# Patient Record
Sex: Female | Born: 1990
Health system: Southern US, Community
[De-identification: ages and names within clinical notes are randomized; demographics above are authoritative.]

## PROBLEM LIST (undated history)

## (undated) ENCOUNTER — Inpatient Hospital Stay (HOSPITAL_COMMUNITY): Payer: Self-pay

## (undated) DIAGNOSIS — J069 Acute upper respiratory infection, unspecified: Secondary | ICD-10-CM

## (undated) DIAGNOSIS — E7800 Pure hypercholesterolemia, unspecified: Secondary | ICD-10-CM

## (undated) DIAGNOSIS — E0590 Thyrotoxicosis, unspecified without thyrotoxic crisis or storm: Secondary | ICD-10-CM

## (undated) DIAGNOSIS — R87619 Unspecified abnormal cytological findings in specimens from cervix uteri: Secondary | ICD-10-CM

## (undated) DIAGNOSIS — T783XXA Angioneurotic edema, initial encounter: Secondary | ICD-10-CM

## (undated) DIAGNOSIS — E119 Type 2 diabetes mellitus without complications: Secondary | ICD-10-CM

## (undated) DIAGNOSIS — H81399 Other peripheral vertigo, unspecified ear: Secondary | ICD-10-CM

## (undated) DIAGNOSIS — Z862 Personal history of diseases of the blood and blood-forming organs and certain disorders involving the immune mechanism: Secondary | ICD-10-CM

## (undated) DIAGNOSIS — F419 Anxiety disorder, unspecified: Secondary | ICD-10-CM

## (undated) DIAGNOSIS — Z87898 Personal history of other specified conditions: Secondary | ICD-10-CM

## (undated) DIAGNOSIS — D649 Anemia, unspecified: Secondary | ICD-10-CM

## (undated) DIAGNOSIS — T7840XA Allergy, unspecified, initial encounter: Secondary | ICD-10-CM

## (undated) DIAGNOSIS — K219 Gastro-esophageal reflux disease without esophagitis: Secondary | ICD-10-CM

## (undated) DIAGNOSIS — O039 Complete or unspecified spontaneous abortion without complication: Secondary | ICD-10-CM

## (undated) DIAGNOSIS — I10 Essential (primary) hypertension: Secondary | ICD-10-CM

## (undated) DIAGNOSIS — L509 Urticaria, unspecified: Secondary | ICD-10-CM

## (undated) DIAGNOSIS — D509 Iron deficiency anemia, unspecified: Secondary | ICD-10-CM

## (undated) HISTORY — DX: Personal history of diseases of the blood and blood-forming organs and certain disorders involving the immune mechanism: Z86.2

## (undated) HISTORY — DX: Thyrotoxicosis, unspecified without thyrotoxic crisis or storm: E05.90

## (undated) HISTORY — DX: Iron deficiency anemia, unspecified: D50.9

## (undated) HISTORY — DX: Allergy, unspecified, initial encounter: T78.40XA

## (undated) HISTORY — DX: Personal history of other specified conditions: Z87.898

## (undated) HISTORY — DX: Other peripheral vertigo, unspecified ear: H81.399

## (undated) HISTORY — DX: Angioneurotic edema, initial encounter: T78.3XXA

## (undated) HISTORY — DX: Urticaria, unspecified: L50.9

## (undated) HISTORY — DX: Anxiety disorder, unspecified: F41.9

## (undated) HISTORY — DX: Acute upper respiratory infection, unspecified: J06.9

## (undated) HISTORY — DX: Gastro-esophageal reflux disease without esophagitis: K21.9

## (undated) HISTORY — DX: Unspecified abnormal cytological findings in specimens from cervix uteri: R87.619

## (undated) HISTORY — DX: Type 2 diabetes mellitus without complications: E11.9

---
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
## 2009-02-06 ENCOUNTER — Emergency Department (HOSPITAL_COMMUNITY): Admission: EM | Admit: 2009-02-06 | Discharge: 2009-02-06 | Payer: Self-pay | Admitting: Emergency Medicine

## 2009-06-25 ENCOUNTER — Emergency Department (HOSPITAL_COMMUNITY): Admission: EM | Admit: 2009-06-25 | Discharge: 2009-06-25 | Payer: Self-pay | Admitting: Emergency Medicine

## 2009-06-28 ENCOUNTER — Emergency Department (HOSPITAL_COMMUNITY): Admission: EM | Admit: 2009-06-28 | Discharge: 2009-06-28 | Payer: Self-pay | Admitting: Emergency Medicine

## 2009-06-30 ENCOUNTER — Emergency Department (HOSPITAL_COMMUNITY): Admission: EM | Admit: 2009-06-30 | Discharge: 2009-06-30 | Payer: Self-pay | Admitting: Emergency Medicine

## 2009-10-11 ENCOUNTER — Emergency Department (HOSPITAL_COMMUNITY): Admission: EM | Admit: 2009-10-11 | Discharge: 2009-10-11 | Payer: Self-pay | Admitting: Emergency Medicine

## 2009-10-13 ENCOUNTER — Emergency Department (HOSPITAL_COMMUNITY): Admission: EM | Admit: 2009-10-13 | Discharge: 2009-10-13 | Payer: Self-pay | Admitting: Emergency Medicine

## 2010-03-20 ENCOUNTER — Emergency Department (HOSPITAL_COMMUNITY): Admission: EM | Admit: 2010-03-20 | Discharge: 2010-03-21 | Payer: Self-pay | Admitting: Emergency Medicine

## 2010-10-31 LAB — CBC
MCH: 24.2 pg — ABNORMAL LOW (ref 26.0–34.0)
MCHC: 33.6 g/dL (ref 30.0–36.0)
Platelets: 286 10*3/uL (ref 150–400)
RBC: 4.77 MIL/uL (ref 3.87–5.11)
RDW: 15.6 % — ABNORMAL HIGH (ref 11.5–15.5)

## 2010-10-31 LAB — URINALYSIS, ROUTINE W REFLEX MICROSCOPIC
Bilirubin Urine: NEGATIVE
Glucose, UA: NEGATIVE mg/dL
Hgb urine dipstick: NEGATIVE
Ketones, ur: NEGATIVE mg/dL
Nitrite: NEGATIVE
Urobilinogen, UA: 0.2 mg/dL (ref 0.0–1.0)
pH: 5 (ref 5.0–8.0)

## 2010-10-31 LAB — DIFFERENTIAL
Band Neutrophils: 0 % (ref 0–10)
Blasts: 0 %
Eosinophils Absolute: 0 10*3/uL (ref 0.0–0.7)
Myelocytes: 0 %
Promyelocytes Absolute: 0 %
nRBC: 0 /100 WBC

## 2010-10-31 LAB — BASIC METABOLIC PANEL
Calcium: 10.2 mg/dL (ref 8.4–10.5)
Creatinine, Ser: 0.49 mg/dL (ref 0.4–1.2)
GFR calc Af Amer: >60 mL/min (ref >60)
GFR calc non Af Amer: >60 mL/min (ref >60)
Glucose, Bld: 149 mg/dL — ABNORMAL HIGH (ref 70–99)

## 2010-10-31 LAB — POCT PREGNANCY, URINE: Preg Test, Ur: NEGATIVE

## 2010-10-31 LAB — URINE MICROSCOPIC-ADD ON

## 2010-11-19 LAB — STREP A DNA PROBE: Group A Strep Probe: NEGATIVE

## 2010-11-19 LAB — RAPID STREP SCREEN (MED CTR MEBANE ONLY): Streptococcus, Group A Screen (Direct): NEGATIVE

## 2011-05-11 LAB — URINALYSIS, ROUTINE W REFLEX MICROSCOPIC
Bilirubin Urine: NEGATIVE
Glucose, UA: NEGATIVE
Leukocytes, UA: NEGATIVE
Specific Gravity, Urine: 1.025
Urobilinogen, UA: 0.2

## 2011-05-11 LAB — URINE MICROSCOPIC-ADD ON

## 2011-06-27 ENCOUNTER — Encounter: Payer: Self-pay | Admitting: Emergency Medicine

## 2011-06-27 ENCOUNTER — Emergency Department (HOSPITAL_COMMUNITY)
Admission: EM | Admit: 2011-06-27 | Discharge: 2011-06-28 | Disposition: A | Discharge status: Home / Self Care | Payer: Self-pay | Attending: Emergency Medicine | Admitting: Emergency Medicine

## 2011-06-27 DIAGNOSIS — R110 Nausea: Secondary | ICD-10-CM | POA: Insufficient documentation

## 2011-06-27 DIAGNOSIS — R109 Unspecified abdominal pain: Secondary | ICD-10-CM | POA: Insufficient documentation

## 2011-06-27 DIAGNOSIS — H538 Other visual disturbances: Secondary | ICD-10-CM | POA: Insufficient documentation

## 2011-06-27 DIAGNOSIS — J029 Acute pharyngitis, unspecified: Secondary | ICD-10-CM | POA: Insufficient documentation

## 2011-06-27 DIAGNOSIS — R42 Dizziness and giddiness: Secondary | ICD-10-CM | POA: Insufficient documentation

## 2011-06-27 DIAGNOSIS — R197 Diarrhea, unspecified: Secondary | ICD-10-CM | POA: Insufficient documentation

## 2011-06-27 DIAGNOSIS — F17200 Nicotine dependence, unspecified, uncomplicated: Secondary | ICD-10-CM | POA: Insufficient documentation

## 2011-06-27 LAB — URINALYSIS, ROUTINE W REFLEX MICROSCOPIC
Glucose, UA: NEGATIVE mg/dL
Hgb urine dipstick: NEGATIVE
Ketones, ur: NEGATIVE mg/dL
Leukocytes, UA: NEGATIVE
Nitrite: NEGATIVE
Urobilinogen, UA: 0.2 mg/dL (ref 0.0–1.0)

## 2011-06-27 LAB — RAPID STREP SCREEN (MED CTR MEBANE ONLY): Streptococcus, Group A Screen (Direct): NEGATIVE

## 2011-06-27 NOTE — ED Notes (Signed)
Patient states she has been nauseated all day, then became dizzy with blurry vision while at work about 1630 today. Also c/o sore throat and abdominal pain. Patient states it is possible she could be pregnant.

## 2011-06-27 NOTE — ED Provider Notes (Addendum)
History  Scribed for Geoffery Lyons, MD, the patient was seen in room APA07. This chart was scribed by Hillery Hunter.   CSN: [id number] Arrival date & time: 06/27/2011  9:25 PM   First MD Initiated Contact with Patient 06/27/11 2223      Chief Complaint  Patient presents with  . Nausea  . Blurred Vision    The history is provided by the patient.    Kellie Shaffer is a 20 y.o. female who presents to the Emergency Department complaining of lower abdominal pain and nausea all day today. She reports vomiting once before going to work, having intermittent diarrhea, light-headedness, dim vision while working at her desk today, and sore throat that started recently today as well. She describes the abdominal pain as stomach cramping and denies any associated difficulty with urination, dysuria, vaginal bleeding or discharge. She denies any recent illness contact.    History reviewed. No pertinent past medical history. Patient denies any significant medical history or taking any current medications. LNMP 06/03/2011  History reviewed. No pertinent past surgical history.  History reviewed. No pertinent family history.  History  Substance Use Topics  . Smoking status: Current Everyday Smoker  . Smokeless tobacco: Not on file  . Alcohol Use: No     Review of Systems  HENT: Positive for sore throat.   Eyes: Positive for visual disturbance (dim vision intermittently at work today).  Respiratory: Negative for shortness of breath.   Cardiovascular: Negative for chest pain.  Gastrointestinal: Positive for nausea (all day today), abdominal pain and diarrhea (intermittently today).  Genitourinary: Negative for dysuria, hematuria, vaginal bleeding, vaginal discharge, difficulty urinating and menstrual problem.  Musculoskeletal: Negative for back pain.  Skin: Negative for rash.  Neurological: Positive for light-headedness.  Psychiatric/Behavioral: Negative for confusion.     Allergies  Review of patient's allergies indicates no known allergies.  Home Medications   Current Outpatient Rx  Name Route Sig Dispense Refill  . CETIRIZINE HCL 10 MG PO TABS Oral Take 10 mg by mouth daily.        Triage Vitals: BP 143/81  Pulse 86  Temp(Src) 98.8 F (37.1 C) (Oral)  Resp 14  Ht 5\' 9"  (1.753 m)  Wt 221 lb (100.245 kg)  BMI 32.64 kg/m2  SpO2 100%  LMP 06/03/2011  Physical Exam  Nursing note and vitals reviewed. Constitutional: She is oriented to person, place, and time. She appears well-developed and well-nourished. No distress.  HENT:  Head: Normocephalic and atraumatic.  Eyes: Conjunctivae are normal. Pupils are equal, round, and reactive to light. No scleral icterus.  Neck: Neck supple.  Cardiovascular: Normal rate, regular rhythm and normal heart sounds.   No murmur heard. Pulmonary/Chest: Effort normal and breath sounds normal. No respiratory distress.  Abdominal: Soft. She exhibits no distension. There is tenderness. There is no CVA tenderness.  Musculoskeletal: She exhibits no edema and no tenderness.  Neurological: She is alert and oriented to person, place, and time.  Skin: Skin is warm and dry.  Psychiatric: She has a normal mood and affect. Her behavior is normal.    ED Course  Procedures   Results for orders placed during the hospital encounter of 06/27/11  RAPID STREP SCREEN      Component Value Range   Streptococcus, Group A Screen (Direct) NEGATIVE  NEGATIVE   URINALYSIS, ROUTINE W REFLEX MICROSCOPIC      Component Value Range   Color, Urine YELLOW  YELLOW    Appearance CLEAR  CLEAR  Specific Gravity, Urine 1.025  1.005 - 1.030    pH 6.0  5.0 - 8.0    Glucose, UA NEGATIVE  NEGATIVE (mg/dL)   Hgb urine dipstick NEGATIVE  NEGATIVE    Bilirubin Urine NEGATIVE  NEGATIVE    Ketones, ur NEGATIVE  NEGATIVE (mg/dL)   Protein, ur NEGATIVE  NEGATIVE (mg/dL)   Urobilinogen, UA 0.2  0.0 - 1.0 (mg/dL)   Nitrite NEGATIVE  NEGATIVE     Leukocytes, UA NEGATIVE  NEGATIVE   PREGNANCY, URINE      Component Value Range   Preg Test, Ur NEGATIVE      OTHER DATA REVIEWED: Nursing notes, vital signs reviewed.   DIAGNOSTIC STUDIES: Oxygen Saturation is 100% on room air, normal by my interpretation.     ED COURSE / COORDINATION OF CARE: 22:24. Ordered: Rapid strep screen ; Urinalysis with microscopic ; Pregnancy, urine  23:30. Discussed test findings with patient at bedside who is resting comfortably and amenable to discharge, patient acknowledges return conditions and agrees to treatment plan.   MDM  UA, strep test okay.  Not pregnant.  Will discharge and give time.     1. Pharyngitis   2. Blurry vision       I personally performed the services described in this documentation, which was scribed in my presence. The recorded information has been reviewed and considered.     Geoffery Lyons, MD 06/27/11 1610  Geoffery Lyons, MD 06/27/11 [phone]

## 2011-12-14 ENCOUNTER — Emergency Department (HOSPITAL_COMMUNITY): Payer: Self-pay

## 2011-12-14 ENCOUNTER — Emergency Department (HOSPITAL_COMMUNITY)
Admission: EM | Admit: 2011-12-14 | Discharge: 2011-12-15 | Disposition: A | Discharge status: Home / Self Care | Payer: Self-pay | Attending: Emergency Medicine | Admitting: Emergency Medicine

## 2011-12-14 ENCOUNTER — Encounter (HOSPITAL_COMMUNITY): Payer: Self-pay

## 2011-12-14 DIAGNOSIS — N390 Urinary tract infection, site not specified: Secondary | ICD-10-CM | POA: Insufficient documentation

## 2011-12-14 DIAGNOSIS — R1031 Right lower quadrant pain: Secondary | ICD-10-CM | POA: Insufficient documentation

## 2011-12-14 DIAGNOSIS — A5909 Other urogenital trichomoniasis: Secondary | ICD-10-CM | POA: Insufficient documentation

## 2011-12-14 DIAGNOSIS — O2390 Unspecified genitourinary tract infection in pregnancy, unspecified trimester: Secondary | ICD-10-CM | POA: Insufficient documentation

## 2011-12-14 DIAGNOSIS — N83209 Unspecified ovarian cyst, unspecified side: Secondary | ICD-10-CM | POA: Insufficient documentation

## 2011-12-14 DIAGNOSIS — F17200 Nicotine dependence, unspecified, uncomplicated: Secondary | ICD-10-CM | POA: Insufficient documentation

## 2011-12-14 LAB — URINALYSIS, ROUTINE W REFLEX MICROSCOPIC
Hgb urine dipstick: NEGATIVE
Urobilinogen, UA: 1 mg/dL (ref 0.0–1.0)
pH: 7 (ref 5.0–8.0)

## 2011-12-14 LAB — WET PREP, GENITAL
Clue Cells Wet Prep HPF POC: NONE SEEN
Yeast Wet Prep HPF POC: NONE SEEN

## 2011-12-14 LAB — URINE MICROSCOPIC-ADD ON

## 2011-12-14 LAB — CBC
HCT: 31.8 % — ABNORMAL LOW (ref 36.0–46.0)
Hemoglobin: 10.6 g/dL — ABNORMAL LOW (ref 12.0–15.0)
MCH: 24.6 pg — ABNORMAL LOW (ref 26.0–34.0)
MCHC: 33.3 g/dL (ref 30.0–36.0)

## 2011-12-14 MED ORDER — MORPHINE SULFATE 4 MG/ML IJ SOLN
4.0000 mg | Freq: Once | INTRAMUSCULAR | Status: AC
  Administered 2011-12-14: 4 mg via INTRAVENOUS
  Filled 2011-12-14: qty 1

## 2011-12-14 MED ORDER — SODIUM CHLORIDE 0.9 % IV SOLN
INTRAVENOUS | Status: DC
  Administered 2011-12-14: 23:00:00 via INTRAVENOUS

## 2011-12-14 NOTE — ED Provider Notes (Addendum)
History     CSN: [id number]  Arrival date & time 12/14/11  1851   First MD Initiated Contact with Patient 12/14/11 2135      Chief Complaint  Patient presents with  . Abdominal Pain    (Consider location/radiation/quality/duration/timing/severity/associated sxs/prior treatment) HPI Complains of right lower quadrant pain sharp nonradiating onset this morning, moderate to severe made worse with coughing improved with treatment with a heating pad. No anorexia no fever no other associated symptoms. Last menstrual period 10/21/2011 patient had recent pregnancy test at health department in [place] which was positive No past medical history on file. As medical history is negative No past surgical history on file.  No family history on file.  History  Substance Use Topics  . Smoking status: Current Everyday Smoker  . Smokeless tobacco: Not on file  . Alcohol Use: No    OB History    Grav Para Term Preterm Abortions TAB SAB Ect Mult Living                  Review of Systems  Constitutional: Negative.   HENT: Negative.   Respiratory: Negative.   Cardiovascular: Negative.   Gastrointestinal: Positive for abdominal pain.  Genitourinary:       Pregnant  Musculoskeletal: Negative.   Skin: Negative.   Neurological: Negative.   Hematological: Negative.   Psychiatric/Behavioral: Negative.   All other systems reviewed and are negative.    Allergies  Review of patient's allergies indicates no known allergies.  Home Medications   Current Outpatient Rx  Name Route Sig Dispense Refill  . CETIRIZINE HCL 10 MG PO TABS Oral Take 10 mg by mouth daily.      Marland Kitchen FERROUS SULFATE 325 (65 FE) MG PO TABS Oral Take 325 mg by mouth daily with breakfast.    . OMEGA-3 FATTY ACIDS 1000 MG PO CAPS Oral Take 4 g by mouth daily.      BP 153/95  Pulse 95  Temp(Src) 97.8 F (36.6 C) (Oral)  Resp 18  SpO2 99%  LMP 10/21/2011  Physical Exam  Nursing note and vitals  reviewed. Constitutional: She appears well-developed and well-nourished.  HENT:  Head: Normocephalic and atraumatic.  Eyes: Conjunctivae are normal. Pupils are equal, round, and reactive to light.  Neck: Neck supple. No tracheal deviation present. No thyromegaly present.  Cardiovascular: Normal rate and regular rhythm.   No murmur heard. Pulmonary/Chest: Effort normal and breath sounds normal.  Abdominal: Soft. Bowel sounds are normal. She exhibits no distension. There is no tenderness.       Obese  Genitourinary:       Pelvic exam no external lesion slight amount yellow discharge cervical os closed no cervical motion tenderness no adnexal masses or tenderness  Musculoskeletal: Normal range of motion. She exhibits no edema and no tenderness.  Neurological: She is alert. Coordination normal.  Skin: Skin is warm and dry. No rash noted.  Psychiatric: She has a normal mood and affect.    ED Course  Procedures (including critical care time) 10:40 PM pain improved after treatment with intravenous morphine. Labs Reviewed  URINALYSIS, ROUTINE W REFLEX MICROSCOPIC   No results found.  Results for orders placed during the hospital encounter of 12/14/11  URINALYSIS, ROUTINE W REFLEX MICROSCOPIC      Component Value Range   Color, Urine YELLOW  YELLOW    APPearance CLEAR  CLEAR    Specific Gravity, Urine 1.020  1.005 - 1.030    pH 7.0  5.0 - 8.0  Glucose, UA NEGATIVE  NEGATIVE (mg/dL)   Hgb urine dipstick NEGATIVE  NEGATIVE    Bilirubin Urine NEGATIVE  NEGATIVE    Ketones, ur NEGATIVE  NEGATIVE (mg/dL)   Protein, ur NEGATIVE  NEGATIVE (mg/dL)   Urobilinogen, UA 1.0  0.0 - 1.0 (mg/dL)   Nitrite NEGATIVE  NEGATIVE    Leukocytes, UA MODERATE (*) NEGATIVE   POCT PREGNANCY, URINE      Component Value Range   Preg Test, Ur POSITIVE (*) NEGATIVE   CBC      Component Value Range   WBC 7.5  4.0 - 10.5 (K/uL)   RBC 4.31  3.87 - 5.11 (MIL/uL)   Hemoglobin 10.6 (*) 12.0 - 15.0 (g/dL)    HCT 40.9 (*) 81.1 - 46.0 (%)   MCV 73.8 (*) 78.0 - 100.0 (fL)   MCH 24.6 (*) 26.0 - 34.0 (pg)   MCHC 33.3  30.0 - 36.0 (g/dL)   RDW 91.4  78.2 - 95.6 (%)   Platelets 274  150 - 400 (K/uL)  TYPE AND SCREEN      Component Value Range   ABO/RH(D) O POS     Antibody Screen NEG     Sample Expiration 12/17/2011    WET PREP, GENITAL      Component Value Range   Yeast Wet Prep HPF POC NONE SEEN  NONE SEEN    Trich, Wet Prep RARE (*) NONE SEEN    Clue Cells Wet Prep HPF POC NONE SEEN  NONE SEEN    WBC, Wet Prep HPF POC MANY (*) NONE SEEN   HCG, QUANTITATIVE, PREGNANCY      Component Value Range   hCG, Beta Chain, Quant, S 21308 (*) <5 (mIU/mL)  URINE MICROSCOPIC-ADD ON      Component Value Range   Squamous Epithelial / LPF FEW (*) RARE    WBC, UA 7-10  <3 (WBC/hpf)   RBC / HPF 0-2  <3 (RBC/hpf)   Bacteria, UA MANY (*) RARE    Urine-Other MUCOUS PRESENT    ABO/RH      Component Value Range   ABO/RH(D) O POS     No results found.  No diagnosis found.   Patient signed out to Dr.Palumbo at 12:40 PM MDM  Pelvic ultrasound pending to rule out ectopic pregnancy . Doubt appendicitis no fever no anorexia no right lower quadrant tenderness Diagnosis #1 abdominal pain #2 pregnancy      Doug Sou, MD 12/15/11 [id number]  Doug Sou, MD 12/15/11 [id number]

## 2011-12-14 NOTE — ED Notes (Signed)
MD at bedside. 

## 2011-12-14 NOTE — ED Notes (Signed)
Complains of abd pain and per oncall md was told to come here for rule out tubal preganncy

## 2011-12-14 NOTE — ED Notes (Signed)
Patient transported to Ultrasound 

## 2011-12-15 LAB — ABO/RH: ABO/RH(D): O POS

## 2011-12-15 LAB — GC/CHLAMYDIA PROBE AMP, GENITAL: Chlamydia, DNA Probe: NEGATIVE

## 2011-12-15 MED ORDER — CEFTRIAXONE SODIUM 250 MG IJ SOLR
250.0000 mg | Freq: Once | INTRAMUSCULAR | Status: AC
  Administered 2011-12-15: 250 mg via INTRAMUSCULAR
  Filled 2011-12-15: qty 250

## 2011-12-15 MED ORDER — NITROFURANTOIN MONOHYD MACRO 100 MG PO CAPS: 100.0000 mg | ORAL_CAPSULE | Freq: Two times a day (BID) | ORAL | Status: AC

## 2011-12-15 MED ORDER — AZITHROMYCIN 1 G PO PACK
1.0000 g | PACK | Freq: Once | ORAL | Status: AC
  Administered 2011-12-15: 1 g via ORAL
  Filled 2011-12-15: qty 1

## 2011-12-15 MED ORDER — METRONIDAZOLE 500 MG PO TABS
2000.0000 mg | ORAL_TABLET | Freq: Once | ORAL | Status: AC
  Administered 2011-12-15: 2000 mg via ORAL
  Filled 2011-12-15: qty 4

## 2011-12-15 MED ORDER — LIDOCAINE HCL (PF) 1 % IJ SOLN
INTRAMUSCULAR | Status: AC
  Administered 2011-12-15: 5 mL
  Filled 2011-12-15: qty 5

## 2011-12-15 NOTE — ED Provider Notes (Deleted)
Received patient.  Patient gave consent to speak with family member present.  Patient has UTI and trichomonas on wet prep.  Will treat for GC chlamydia and UTI.  Patient informed will need follow up ultrasound to ensure cyst resolution and also informed no sexual activity until 7 days after all partners treated.  Verbalizes understanding and agrees to follow up  Quinita Kostelecky Smitty Cords, MD 12/15/11 0127

## 2011-12-15 NOTE — ED Notes (Signed)
Patient is AOx4 and comfortable with her discharge instructions. 

## 2011-12-15 NOTE — Discharge Instructions (Signed)
Ovarian Cyst The ovaries are small organs that are on each side of the uterus. The ovaries are the organs that produce the female hormones, estrogen and progesterone. An ovarian cyst is a sac filled with fluid that can vary in its size. It is normal for a small cyst to form in women who are in the childbearing age and who have menstrual periods. This type of cyst is called a follicle cyst that becomes an ovulation cyst (corpus luteum cyst) after it produces the women's egg. It later goes away on its own if the woman does not become pregnant. There are other kinds of ovarian cysts that may cause problems and may need to be treated. The most serious problem is a cyst with cancer. It should be noted that menopausal women who have an ovarian cyst are at a higher risk of it being a cancer cyst. They should be evaluated very quickly, thoroughly and followed closely. This is especially true in menopausal women because of the high rate of ovarian cancer in women in menopause. CAUSES AND TYPES OF OVARIAN CYSTS:  FUNCTIONAL CYST: The follicle/corpus luteum cyst is a functional cyst that occurs every month during ovulation with the menstrual cycle. They go away with the next menstrual cycle if the woman does not get pregnant. Usually, there are no symptoms with a functional cyst.   ENDOMETRIOMA CYST: This cyst develops from the lining of the uterus tissue. This cyst gets in or on the ovary. It grows every month from the bleeding during the menstrual period. It is also called a "chocolate cyst" because it becomes filled with blood that turns brown. This cyst can cause pain in the lower abdomen during intercourse and with your menstrual period.   CYSTADENOMA CYST: This cyst develops from the cells on the outside of the ovary. They usually are not cancerous. They can get very big and cause lower abdomen pain and pain with intercourse. This type of cyst can twist on itself, cut off its blood supply and cause severe pain.  It also can easily rupture and cause a lot of pain.   DERMOID CYST: This type of cyst is sometimes found in both ovaries. They are found to have different kinds of body tissue in the cyst. The tissue includes skin, teeth, hair, and/or cartilage. They usually do not have symptoms unless they get very big. Dermoid cysts are rarely cancerous.   POLYCYSTIC OVARY: This is a rare condition with hormone problems that produces many small cysts on both ovaries. The cysts are follicle-like cysts that never produce an egg and become a corpus luteum. It can cause an increase in body weight, infertility, acne, increase in body and facial hair and lack of menstrual periods or rare menstrual periods. Many women with this problem develop type 2 diabetes. The exact cause of this problem is unknown. A polycystic ovary is rarely cancerous.   THECA LUTEIN CYST: Occurs when too much hormone (human chorionic gonadotropin) is produced and over-stimulates the ovaries to produce an egg. They are frequently seen when doctors stimulate the ovaries for invitro-fertilization (test tube babies).   LUTEOMA CYST: This cyst is seen during pregnancy. Rarely it can cause an obstruction to the birth canal during labor and delivery. They usually go away after delivery.  SYMPTOMS   Pelvic pain or pressure.   Pain during sexual intercourse.   Increasing girth (swelling) of the abdomen.   Abnormal menstrual periods.   Increasing pain with menstrual periods.   You stop having   menstrual periods and you are not pregnant.  DIAGNOSIS  The diagnosis can be made during:  Routine or annual pelvic examination (common).   Ultrasound.   X-ray of the pelvis.   CT Scan.   MRI.   Blood tests.  TREATMENT   Treatment may only be to follow the cyst monthly for 2 to 3 months with your caregiver. Many go away on their own, especially functional cysts.   May be aspirated (drained) with a long needle with ultrasound, or by laparoscopy  (inserting a tube into the pelvis through a small incision).   The whole cyst can be removed by laparoscopy.   Sometimes the cyst may need to be removed through an incision in the lower abdomen.   Hormone treatment is sometimes used to help dissolve certain cysts.   Birth control pills are sometimes used to help dissolve certain cysts.  HOME CARE INSTRUCTIONS  Follow your caregiver's advice regarding:  Medicine.   Follow up visits to evaluate and treat the cyst.   You may need to come back or make an appointment with another caregiver, to find the exact cause of your cyst, if your caregiver is not a gynecologist.   Get your yearly and recommended pelvic examinations and Pap tests.   Let your caregiver know if you have had an ovarian cyst in the past.  SEEK MEDICAL CARE IF:   Your periods are late, irregular, they stop, or are painful.   Your stomach (abdomen) or pelvic pain does not go away.   Your stomach becomes larger or swollen.   You have pressure on your bladder or trouble emptying your bladder completely.   You have painful sexual intercourse.   You have feelings of fullness, pressure, or discomfort in your stomach.   You lose weight for no apparent reason.   You feel generally ill.   You become constipated.   You lose your appetite.   You develop acne.   You have an increase in body and facial hair.   You are gaining weight, without changing your exercise and eating habits.   You think you are pregnant.  SEEK IMMEDIATE MEDICAL CARE IF:   You have increasing abdominal pain.   You feel sick to your stomach (nausea) and/or vomit.   You develop a fever that comes on suddenly.   You develop abdominal pain during a bowel movement.   Your menstrual periods become heavier than usual.  Document Released: 08/03/2005 Document Revised: 07/23/2011 Document Reviewed: 06/06/2009 [employer] Patient Information 2012 [employer]. 

## 2011-12-15 NOTE — ED Provider Notes (Signed)
Received patient. Patient gave consent to speak with family member present. Patient has UTI and trichomonas on wet prep. Will treat for GC chlamydia and UTI. Patient informed will need follow up ultrasound to ensure cyst resolution and also informed no sexual activity until 7 days after all partners treated. Verbalizes understanding and agrees to follow up   124 Case d/w Dr. Algie Coffer, patient does not need to be admitted if able to tolerate PO without fever but will need follow up cultures.    Jasmine Awe, MD 12/15/11 [phone]

## 2011-12-16 DIAGNOSIS — IMO0001 Reserved for inherently not codable concepts without codable children: Secondary | ICD-10-CM

## 2011-12-16 DIAGNOSIS — O039 Complete or unspecified spontaneous abortion without complication: Secondary | ICD-10-CM

## 2011-12-16 HISTORY — DX: Reserved for inherently not codable concepts without codable children: IMO0001

## 2011-12-16 HISTORY — DX: Complete or unspecified spontaneous abortion without complication: O03.9

## 2012-01-01 ENCOUNTER — Ambulatory Visit (INDEPENDENT_AMBULATORY_CARE_PROVIDER_SITE_OTHER): Payer: Self-pay | Admitting: Endocrinology

## 2012-01-01 ENCOUNTER — Encounter: Payer: Self-pay | Admitting: Endocrinology

## 2012-01-01 VITALS — BP 132/62 | HR 104 | Temp 98.0°F | Ht 69.0 in | Wt 221.0 lb

## 2012-01-01 DIAGNOSIS — E0590 Thyrotoxicosis, unspecified without thyrotoxic crisis or storm: Secondary | ICD-10-CM

## 2012-01-01 DIAGNOSIS — R739 Hyperglycemia, unspecified: Secondary | ICD-10-CM

## 2012-01-01 DIAGNOSIS — R7309 Other abnormal glucose: Secondary | ICD-10-CM

## 2012-01-01 MED ORDER — PROPYLTHIOURACIL 50 MG PO TABS: 150.0000 mg | ORAL_TABLET | Freq: Three times a day (TID) | ORAL | Status: DC

## 2012-01-01 NOTE — Patient Instructions (Addendum)
i have sent a prescription to your pharmacy, for a medication to slow down the thyroid.  if ever you have fever while taking this medication, stop it and call us, because of the risk of a rare side-effect. Please come back for a follow-up appointment in 2 weeks. If you continue the pregnancy, it is extremely important that you start checking your blood sugar.  You would probably need insulin.  You should keep the appointment in [place] next week, if you want to terminate the pregnancy.

## 2012-01-01 NOTE — Progress Notes (Signed)
Subjective:    Patient ID: Kellie Shaffer, female    DOB: Dec 29, 1990, 21 y.o.   MRN: [id number]  HPI Pt was found to have elev a1c and ow tsh 2 mos ago.  She is [redacted] weeks pregnant, and has an appointment next week with an abortion clinic in [place].  She says she was poorly prepared for the pregnancy, and continues to consume alcohol. Pt has a few weeks of moderate pain at the right pelvis (she says she was told at [facility] that pain was due to an ovarian cyst).  No assoc fever.  Past Medical History  Diagnosis Date  . Hyperthyroidism   . Migraine     No past surgical history on file.  History   Social History  . Marital Status: Single    Spouse Name: N/A    Number of Children: N/A  . Years of Education: 14   Occupational History  . [employer]    Social History Main Topics  . Smoking status: Current Everyday Smoker  . Smokeless tobacco: Not on file  . Alcohol Use: Yes  . Drug Use: No  . Sexually Active: Yes    Birth Control/ Protection: Condom   Other Topics Concern  . Not on file   Social History Narrative   Regular exercise-yesCaffeine Use-yes    No current outpatient prescriptions on file prior to visit.    No Known Allergies  Family History  Problem Relation Age of Onset  . Diabetes Other     Parent  . Cancer Other     Breast Cancer-Parent  . Hypertension Other     Parent  . Hyperlipidemia Other     Parent  . Arthritis Other     Parent  no goiter or other thyroid problem Mother has DM BP 132/62  Pulse 104  Temp(Src) 98 F (36.7 C) (Oral)  Ht 5\' 9"  (1.753 m)  Wt 221 lb (100.245 kg)  BMI 32.64 kg/m2  SpO2 99%  LMP 10/21/2011  Review of Systems denies weight loss, headache, hoarseness, double vision, sob, diarrhea, polyuria, myalgias, excessive diaphoresis, numbness, anxiety, hypoglycemia, easy bruising, and rhinorrhea.  She has palpitations and tremor.      Objective:   Physical Exam VS: see vs page GEN: no distress.  obese HEAD:  head: no deformity eyes: no periorbital swelling/.  There is bilateral proptosis. external nose and ears are normal mouth: no lesion seen NECK: thyroid is slightly and diffusely enlarged CHEST WALL: no deformity LUNGS:  Clear to auscultation CV: reg rate and rhythm, no murmur ABD: abdomen is soft, nontender.  no hepatosplenomegaly.  not distended.  no hernia MUSCULOSKELETAL: muscle bulk and strength are grossly normal.  no obvious joint swelling.  gait is normal and steady EXTEMITIES: no deformity.  no ulcer on the feet.  feet are of normal color and temp.  no edema PULSES: dorsalis pedis intact bilat.  no carotid bruit NEURO:  cn 2-12 grossly intact.   readily moves all 4's.  sensation is intact to touch on the feet SKIN:  Normal texture and temperature.  No rash or suspicious lesion is visible.   NODES:  None palpable at the neck PSYCH: alert, oriented x3.  Does not appear anxious nor depressed.   outside test results are reviewed: TSH is undetectable a1c=6.3    Assessment & Plan:  Hyperthyroidism, prob due to grave's dz Hyperglycemia.  This a1c almost certainly indicates GDM, which carries a high risk her health, and that of her unborn  child. Smoking and alcohol consumption.  These cause high risk to her unborn child.

## 2012-01-02 ENCOUNTER — Inpatient Hospital Stay (HOSPITAL_COMMUNITY): Payer: Self-pay

## 2012-01-02 ENCOUNTER — Inpatient Hospital Stay (HOSPITAL_COMMUNITY)
Admission: AD | Admit: 2012-01-02 | Discharge: 2012-01-02 | Disposition: A | Discharge status: Home / Self Care | Payer: Self-pay | Source: Ambulatory Visit | Attending: Obstetrics & Gynecology | Admitting: Obstetrics & Gynecology

## 2012-01-02 ENCOUNTER — Encounter (HOSPITAL_COMMUNITY): Payer: Self-pay | Admitting: *Deleted

## 2012-01-02 DIAGNOSIS — O99280 Endocrine, nutritional and metabolic diseases complicating pregnancy, unspecified trimester: Secondary | ICD-10-CM | POA: Insufficient documentation

## 2012-01-02 DIAGNOSIS — O209 Hemorrhage in early pregnancy, unspecified: Secondary | ICD-10-CM | POA: Insufficient documentation

## 2012-01-02 DIAGNOSIS — R109 Unspecified abdominal pain: Secondary | ICD-10-CM | POA: Insufficient documentation

## 2012-01-02 DIAGNOSIS — N831 Corpus luteum cyst of ovary, unspecified side: Secondary | ICD-10-CM | POA: Insufficient documentation

## 2012-01-02 DIAGNOSIS — O34599 Maternal care for other abnormalities of gravid uterus, unspecified trimester: Secondary | ICD-10-CM | POA: Insufficient documentation

## 2012-01-02 DIAGNOSIS — E0590 Thyrotoxicosis, unspecified without thyrotoxic crisis or storm: Secondary | ICD-10-CM | POA: Insufficient documentation

## 2012-01-02 DIAGNOSIS — O26899 Other specified pregnancy related conditions, unspecified trimester: Secondary | ICD-10-CM

## 2012-01-02 DIAGNOSIS — E079 Disorder of thyroid, unspecified: Secondary | ICD-10-CM | POA: Insufficient documentation

## 2012-01-02 HISTORY — DX: Pure hypercholesterolemia, unspecified: E78.00

## 2012-01-02 LAB — CBC
HCT: 30 % — ABNORMAL LOW (ref 36.0–46.0)
Hemoglobin: 9.7 g/dL — ABNORMAL LOW (ref 12.0–15.0)
MCH: 24.3 pg — ABNORMAL LOW (ref 26.0–34.0)
MCV: 75 fL — ABNORMAL LOW (ref 78.0–100.0)
RBC: 4 MIL/uL (ref 3.87–5.11)

## 2012-01-02 LAB — COMPREHENSIVE METABOLIC PANEL
AST: 16 U/L (ref 0–37)
Albumin: 3.3 g/dL — ABNORMAL LOW (ref 3.5–5.2)
Calcium: 9.3 mg/dL (ref 8.4–10.5)
Chloride: 103 mEq/L (ref 96–112)
Creatinine, Ser: 0.37 mg/dL — ABNORMAL LOW (ref 0.50–1.10)
Total Bilirubin: 0.4 mg/dL (ref 0.3–1.2)
Total Protein: 6.6 g/dL (ref 6.0–8.3)

## 2012-01-02 LAB — URINALYSIS, ROUTINE W REFLEX MICROSCOPIC
Ketones, ur: NEGATIVE mg/dL
Leukocytes, UA: NEGATIVE
Nitrite: NEGATIVE
Urobilinogen, UA: 1 mg/dL (ref 0.0–1.0)
pH: 7 (ref 5.0–8.0)

## 2012-01-02 MED ORDER — KETOROLAC TROMETHAMINE 30 MG/ML IJ SOLN: 30.0000 mg | Freq: Once | INTRAMUSCULAR | Status: DC

## 2012-01-02 MED ORDER — KETOROLAC TROMETHAMINE 30 MG/ML IJ SOLN
30.0000 mg | Freq: Once | INTRAMUSCULAR | Status: AC
  Administered 2012-01-02: 30 mg via INTRAMUSCULAR
  Filled 2012-01-02: qty 1

## 2012-01-02 NOTE — MAU Provider Note (Signed)
History     CSN: [id number]  Arrival date and time: 01/02/12 1408   None     Chief Complaint  Patient presents with  . Abdominal Pain   HPI Pt is [redacted]w[redacted]d pregnant and presents with recurrence of right lower abdominal pain since yesterday.  She was seen on 4/29 for abdominal pain and was told she had an ovarian cyst and UTI and trichomonas.  She was OK for 2 weeks and then has had recurrence of pain starting as dull pain progressing through the day.  [facility] and was told she had hyperthyroidism and was seen with Dr. Romero Belling.    Past Medical History  Diagnosis Date  . Hyperthyroidism   . Migraine   . Ovarian cyst   . Hypercholesteremia     No past surgical history on file.  Family History  Problem Relation Age of Onset  . Diabetes Other     Parent  . Cancer Other     Breast Cancer-Parent  . Hypertension Other     Parent  . Hyperlipidemia Other     Parent  . Arthritis Other     Parent    History  Substance Use Topics  . Smoking status: Current Everyday Smoker  . Smokeless tobacco: Not on file  . Alcohol Use: Yes    Allergies: No Known Allergies  Prescriptions prior to admission  Medication Sig Dispense Refill  . cetirizine (ZYRTEC) 10 MG tablet Take 10 mg by mouth daily.      . ferrous sulfate 325 (65 FE) MG tablet Take 325 mg by mouth every morning.      . propylthiouracil (PTU) 50 MG tablet Take 3 tablets (150 mg total) by mouth 3 (three) times daily.  270 tablet  1    Review of Systems  Constitutional: Negative for fever and chills.  Gastrointestinal: Positive for abdominal pain. Negative for nausea, vomiting, diarrhea and constipation.  Genitourinary: Negative for dysuria, urgency and frequency.   Physical Exam   Blood pressure 162/75, pulse 92, temperature 97.3 F (36.3 C), temperature source Oral, resp. rate 18, height 5\' 9"  (1.753 m), weight 221 lb 9.6 oz (100.517 kg), last menstrual period 10/21/2011.  Physical Exam    Vitals reviewed. Constitutional: She is oriented to person, place, and time. She appears well-developed and well-nourished.  HENT:  Head: Normocephalic.  Eyes: Pupils are equal, round, and reactive to light.  Neck: Neck supple.  Cardiovascular: Normal rate.   Respiratory: Effort normal.  GI: Soft. She exhibits no distension. There is tenderness. There is no rebound and no guarding.  Genitourinary:       Small amount of bright red blood in vault; uterus 10 week size; right adnexal tenderness; no appreciable enlargement; without left adnexal enlargement or tenderness appreciated  Musculoskeletal: Normal range of motion.  Neurological: She is alert and oriented to person, place, and time.  Skin: Skin is warm and dry.  Psychiatric: She has a normal mood and affect.    MAU Course  Procedures Clinical Data: 21 year old pregnant female with right pelvic pain.  Estimated gestation of 10 weeks 3 days by prior ultrasound.  TRANSVAGINAL OBSTETRIC US  Technique: Transvaginal ultrasound was performed for complete  evaluation of the gestation as well as the maternal uterus, adnexal  regions, and pelvic cul-de-sac.  Comparison: 12/14/2011  Intrauterine gestational sac: Visualized and normal in shape.  Yolk sac: Not visualized  Embryo: Visualized  Cardiac Activity: Visualized  Heart Rate: 167  CRL:  36 mm 10 w 4 d [place] EDC: 07/27/2012  Subchorionic hemorrhage: Small  Maternal uterus/adnexae:  A 1.5 cm corpus luteum cyst in the right ovary noted.  The left ovary is unremarkable.  There is no evidence of free fluid or solid adnexal mass.  IMPRESSION:  Single living intrauterine gestation with assigned gestational age  of [redacted] weeks 3 days - appropriate growth since the prior study.  Small subchorionic hemorrhage.  Original Report Authenticated By: Rosendo Gros, M.D. Results for orders placed during the hospital encounter of 01/02/12 (from the past 24 hour(s))  URINALYSIS, ROUTINE W REFLEX  MICROSCOPIC     Status: Normal   Collection Time   01/02/12  2:15 PM      Component Value Range   Color, Urine YELLOW  YELLOW    APPearance CLEAR  CLEAR    Specific Gravity, Urine 1.020  1.005 - 1.030    pH 7.0  5.0 - 8.0    Glucose, UA NEGATIVE  NEGATIVE (mg/dL)   Hgb urine dipstick NEGATIVE  NEGATIVE    Bilirubin Urine NEGATIVE  NEGATIVE    Ketones, ur NEGATIVE  NEGATIVE (mg/dL)   Protein, ur NEGATIVE  NEGATIVE (mg/dL)   Urobilinogen, UA 1.0  0.0 - 1.0 (mg/dL)   Nitrite NEGATIVE  NEGATIVE    Leukocytes, UA NEGATIVE  NEGATIVE   COMPREHENSIVE METABOLIC PANEL     Status: Abnormal   Collection Time   01/02/12  3:23 PM      Component Value Range   Sodium 136  135 - 145 (mEq/L)   Potassium 3.5  3.5 - 5.1 (mEq/L)   Chloride 103  96 - 112 (mEq/L)   CO2 23  19 - 32 (mEq/L)   Glucose, Bld 102 (*) 70 - 99 (mg/dL)   BUN 7  6 - 23 (mg/dL)   Creatinine, Ser 9.81 (*) 0.50 - 1.10 (mg/dL)   Calcium 9.3  8.4 - 19.1 (mg/dL)   Total Protein 6.6  6.0 - 8.3 (g/dL)   Albumin 3.3 (*) 3.5 - 5.2 (g/dL)   AST 16  0 - 37 (U/L)   ALT 24  0 - 35 (U/L)   Alkaline Phosphatase 59  39 - 117 (U/L)   Total Bilirubin 0.4  0.3 - 1.2 (mg/dL)   GFR calc non Af Amer >90  >90 (mL/min)   GFR calc Af Amer >90  >90 (mL/min)  CBC     Status: Abnormal   Collection Time   01/02/12  3:23 PM      Component Value Range   WBC 4.6  4.0 - 10.5 (K/uL)   RBC 4.00  3.87 - 5.11 (MIL/uL)   Hemoglobin 9.7 (*) 12.0 - 15.0 (g/dL)   HCT 47.8 (*) 29.5 - 46.0 (%)   MCV 75.0 (*) 78.0 - 100.0 (fL)   MCH 24.3 (*) 26.0 - 34.0 (pg)   MCHC 32.3  30.0 - 36.0 (g/dL)   RDW 62.1  30.8 - 65.7 (%)   Platelets 204  150 - 400 (K/uL)   Discussed with Dr. Debroah Loop about pt having hyperthyroidism and plan of care  Assessment and Plan  IUP [redacted]w[redacted]d with right CLC and small [facility] Hyperthyroidism; elevated AIC D/u with [facility] and then HR OB clinic  [facility] 01/02/2012, 3:05 PM

## 2012-01-02 NOTE — MAU Note (Signed)
Pt reports she went to Kellie Shaffer 2 weeks ago for abd pain and was told she had an ovarian cyst. Pain is back again on right side. Denies vag bleeding and report small amount of vaginal discharge.

## 2012-01-03 ENCOUNTER — Encounter: Payer: Self-pay | Admitting: Endocrinology

## 2012-01-03 DIAGNOSIS — R739 Hyperglycemia, unspecified: Secondary | ICD-10-CM | POA: Insufficient documentation

## 2012-01-03 DIAGNOSIS — E7800 Pure hypercholesterolemia, unspecified: Secondary | ICD-10-CM | POA: Insufficient documentation

## 2012-01-03 DIAGNOSIS — E0590 Thyrotoxicosis, unspecified without thyrotoxic crisis or storm: Secondary | ICD-10-CM | POA: Insufficient documentation

## 2012-01-15 ENCOUNTER — Ambulatory Visit (INDEPENDENT_AMBULATORY_CARE_PROVIDER_SITE_OTHER): Payer: Self-pay | Admitting: Endocrinology

## 2012-01-15 ENCOUNTER — Encounter: Payer: Self-pay | Admitting: Endocrinology

## 2012-01-15 VITALS — BP 132/72 | HR 100 | Temp 98.5°F | Ht 69.0 in | Wt 220.0 lb

## 2012-01-15 DIAGNOSIS — E0590 Thyrotoxicosis, unspecified without thyrotoxic crisis or storm: Secondary | ICD-10-CM

## 2012-01-15 MED ORDER — METHIMAZOLE 10 MG PO TABS: 10.0000 mg | ORAL_TABLET | Freq: Three times a day (TID) | ORAL | Status: DC

## 2012-01-15 NOTE — Patient Instructions (Addendum)
i have sent a prescription to [facility], for a medication to slow down the thyroid.  if ever you have fever while taking this medication, stop it and call us, because of the risk of a rare side-effect. Please come back for a follow-up appointment in 1 month.

## 2012-01-15 NOTE — Progress Notes (Signed)
  Subjective:    Patient ID: Kellie Shaffer, female    DOB: 30-Jul-1991, 21 y.o.   MRN: [id number]  HPI Pt had the abortion last week.  She did not get the tapazole filled, due to cost.  pt states she feels well in general, except for tremor.   Past Medical History  Diagnosis Date  . Hyperthyroidism   . Migraine   . Ovarian cyst   . Hypercholesteremia     No past surgical history on file.  History   Social History  . Marital Status: Single    Spouse Name: N/A    Number of Children: N/A  . Years of Education: 14   Occupational History  . [employer]    Social History Main Topics  . Smoking status: Current Everyday Smoker  . Smokeless tobacco: Not on file  . Alcohol Use: Yes  . Drug Use: No  . Sexually Active: Yes    Birth Control/ Protection: Condom   Other Topics Concern  . Not on file   Social History Narrative   Regular exercise-yesCaffeine Use-yes    Current Outpatient Prescriptions on File Prior to Visit  Medication Sig Dispense Refill  . cetirizine (ZYRTEC) 10 MG tablet Take 10 mg by mouth daily.      . ferrous sulfate 325 (65 FE) MG tablet Take 325 mg by mouth every morning.      . methimazole (TAPAZOLE) 10 MG tablet Take 1 tablet (10 mg total) by mouth 3 (three) times daily.  100 tablet  1    No Known Allergies  Family History  Problem Relation Age of Onset  . Diabetes Other     Parent  . Cancer Other     Breast Cancer-Parent  . Hypertension Other     Parent  . Hyperlipidemia Other     Parent  . Arthritis Other     Parent    BP 132/72  Pulse 100  Temp(Src) 98.5 F (36.9 C) (Oral)  Ht 5\' 9"  (1.753 m)  Wt 220 lb (99.791 kg)  BMI 32.49 kg/m2  SpO2 98%  LMP 10/21/2011   Review of Systems Denies fever    Objective:   Physical Exam VITAL SIGNS:  See vs page GENERAL: no distress NECK: thyroid is slightly and diffusely enlarged      Assessment & Plan:  Hyperthyroidism, therapy limited by noncompliance.  i'll do the best i  can. She is no longer pregnant, so rx goals are normalization of TFT

## 2012-02-26 ENCOUNTER — Ambulatory Visit: Payer: Self-pay | Admitting: Endocrinology

## 2012-03-18 ENCOUNTER — Ambulatory Visit: Payer: Self-pay | Admitting: Endocrinology

## 2012-03-18 DIAGNOSIS — Z0289 Encounter for other administrative examinations: Secondary | ICD-10-CM

## 2012-05-20 ENCOUNTER — Encounter (HOSPITAL_COMMUNITY): Payer: Self-pay | Admitting: Emergency Medicine

## 2012-05-20 ENCOUNTER — Inpatient Hospital Stay (HOSPITAL_COMMUNITY)
Admission: EM | Admit: 2012-05-20 | Discharge: 2012-05-22 | DRG: 645 | Disposition: A | Discharge status: Home / Self Care | Payer: MEDICAID | Attending: Family Medicine | Admitting: Family Medicine

## 2012-05-20 ENCOUNTER — Inpatient Hospital Stay (HOSPITAL_COMMUNITY): Payer: Self-pay

## 2012-05-20 DIAGNOSIS — R002 Palpitations: Secondary | ICD-10-CM | POA: Diagnosis present

## 2012-05-20 DIAGNOSIS — I4891 Unspecified atrial fibrillation: Secondary | ICD-10-CM | POA: Diagnosis present

## 2012-05-20 DIAGNOSIS — E119 Type 2 diabetes mellitus without complications: Secondary | ICD-10-CM | POA: Diagnosis present

## 2012-05-20 DIAGNOSIS — Z9119 Patient's noncompliance with other medical treatment and regimen: Secondary | ICD-10-CM

## 2012-05-20 DIAGNOSIS — Z91199 Patient's noncompliance with other medical treatment and regimen due to unspecified reason: Secondary | ICD-10-CM

## 2012-05-20 DIAGNOSIS — E7800 Pure hypercholesterolemia, unspecified: Secondary | ICD-10-CM | POA: Diagnosis present

## 2012-05-20 DIAGNOSIS — E0590 Thyrotoxicosis, unspecified without thyrotoxic crisis or storm: Principal | ICD-10-CM | POA: Diagnosis present

## 2012-05-20 DIAGNOSIS — F17200 Nicotine dependence, unspecified, uncomplicated: Secondary | ICD-10-CM | POA: Diagnosis present

## 2012-05-20 DIAGNOSIS — Z79899 Other long term (current) drug therapy: Secondary | ICD-10-CM

## 2012-05-20 HISTORY — DX: Anemia, unspecified: D64.9

## 2012-05-20 HISTORY — DX: Complete or unspecified spontaneous abortion without complication: O03.9

## 2012-05-20 LAB — URINALYSIS, ROUTINE W REFLEX MICROSCOPIC
Glucose, UA: NEGATIVE mg/dL
Hgb urine dipstick: NEGATIVE
Ketones, ur: NEGATIVE mg/dL
Leukocytes, UA: NEGATIVE
Protein, ur: NEGATIVE mg/dL
Urobilinogen, UA: 1 mg/dL (ref 0.0–1.0)

## 2012-05-20 LAB — CBC WITH DIFFERENTIAL/PLATELET
Basophils Absolute: 0 10*3/uL (ref 0.0–0.1)
Eosinophils Absolute: 0.1 10*3/uL (ref 0.0–0.7)
Lymphocytes Relative: 43 % (ref 12–46)
MCHC: 33 g/dL (ref 30.0–36.0)
Monocytes Relative: 14 % — ABNORMAL HIGH (ref 3–12)
Neutrophils Relative %: 41 % — ABNORMAL LOW (ref 43–77)
Platelets: 202 10*3/uL (ref 150–400)
RDW: 14.6 % (ref 11.5–15.5)
WBC: 5.9 10*3/uL (ref 4.0–10.5)

## 2012-05-20 LAB — POCT PREGNANCY, URINE: Preg Test, Ur: NEGATIVE

## 2012-05-20 LAB — BASIC METABOLIC PANEL
Chloride: 105 mEq/L (ref 96–112)
GFR calc Af Amer: >90 mL/min (ref >90)
GFR calc non Af Amer: >90 mL/min (ref >90)
Potassium: 3.6 mEq/L (ref 3.5–5.1)

## 2012-05-20 MED ORDER — DILTIAZEM HCL 25 MG/5ML IV SOLN
20.0000 mg | Freq: Once | INTRAVENOUS | Status: AC
  Administered 2012-05-20: 20 mg via INTRAVENOUS

## 2012-05-20 MED ORDER — ONDANSETRON HCL 4 MG/2ML IJ SOLN: 4.0000 mg | Freq: Four times a day (QID) | INTRAMUSCULAR | Status: DC | PRN

## 2012-05-20 MED ORDER — DILTIAZEM HCL 100 MG IV SOLR
5.0000 mg/h | INTRAVENOUS | Status: AC
  Administered 2012-05-21: 2.5 mg/h via INTRAVENOUS
  Filled 2012-05-20: qty 100

## 2012-05-20 MED ORDER — ACETAMINOPHEN 650 MG RE SUPP: 650.0000 mg | Freq: Four times a day (QID) | RECTAL | Status: DC | PRN

## 2012-05-20 MED ORDER — METHIMAZOLE 10 MG PO TABS
10.0000 mg | ORAL_TABLET | ORAL | Status: AC
  Administered 2012-05-20: 10 mg via ORAL
  Filled 2012-05-20: qty 1

## 2012-05-20 MED ORDER — METOPROLOL TARTRATE 50 MG PO TABS
50.0000 mg | ORAL_TABLET | Freq: Two times a day (BID) | ORAL | Status: DC
  Filled 2012-05-20 (×2): qty 1

## 2012-05-20 MED ORDER — ADENOSINE 6 MG/2ML IV SOLN: 6.0000 mg | Freq: Once | INTRAVENOUS | Status: DC

## 2012-05-20 MED ORDER — DILTIAZEM HCL 100 MG IV SOLR
5.0000 mg/h | Freq: Once | INTRAVENOUS | Status: AC
  Administered 2012-05-20: 5 mg/h via INTRAVENOUS
  Filled 2012-05-20: qty 100

## 2012-05-20 MED ORDER — METHIMAZOLE 10 MG PO TABS
20.0000 mg | ORAL_TABLET | Freq: Three times a day (TID) | ORAL | Status: DC
  Administered 2012-05-21 – 2012-05-22 (×5): 20 mg via ORAL
  Filled 2012-05-20 (×6): qty 2

## 2012-05-20 MED ORDER — ONDANSETRON HCL 4 MG PO TABS: 4.0000 mg | ORAL_TABLET | Freq: Four times a day (QID) | ORAL | Status: DC | PRN

## 2012-05-20 MED ORDER — METOPROLOL TARTRATE 25 MG PO TABS
50.0000 mg | ORAL_TABLET | Freq: Once | ORAL | Status: AC
  Administered 2012-05-20: 50 mg via ORAL
  Filled 2012-05-20: qty 2

## 2012-05-20 MED ORDER — ADENOSINE 6 MG/2ML IV SOLN
INTRAVENOUS | Status: AC
  Filled 2012-05-20: qty 6

## 2012-05-20 MED ORDER — SODIUM CHLORIDE 0.9 % IV SOLN
INTRAVENOUS | Status: DC
  Administered 2012-05-20: 100 mL/h via INTRAVENOUS
  Administered 2012-05-21: 12:00:00 via INTRAVENOUS
  Administered 2012-05-21: 100 mL/h via INTRAVENOUS

## 2012-05-20 MED ORDER — ENOXAPARIN SODIUM 40 MG/0.4ML ~~LOC~~ SOLN
40.0000 mg | SUBCUTANEOUS | Status: DC
  Administered 2012-05-21 (×2): 40 mg via SUBCUTANEOUS
  Filled 2012-05-20 (×3): qty 0.4

## 2012-05-20 MED ORDER — ACETAMINOPHEN 325 MG PO TABS
650.0000 mg | ORAL_TABLET | Freq: Four times a day (QID) | ORAL | Status: DC | PRN
  Administered 2012-05-21 – 2012-05-22 (×3): 650 mg via ORAL
  Filled 2012-05-20 (×3): qty 2

## 2012-05-20 NOTE — H&P (Signed)
Family Medicine Teaching [facility] Admission History and Physical Service Pager: [phone]  Patient name: Kellie Shaffer Medical record number: [id number] Date of birth: September 06, 1990 Age: 21 y.o. Gender: female  Primary Care Provider: Willow Ora, PA-C  Chief Complaint: Palpitations   Assessment and Plan: Kellie Shaffer is a 21 y.o. year old female w/ PMHx for Grave's hyperthyroidsim presenting with palpations, shortness of breath, and dizziness 1. 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 1. Started on Diltiazem drip 20 mg and will be titrated upon HR.  Also received on dose of Toprol XR 50 mg.  Will switch to Metoprolol 50 mg bid short acting tomorrow 2. Started on Methimazole 10 mg.  Increasing to 20 mg qd since patient is severely uncontrolled. Consider repeat CBC  3. Follow up TSH, T4.  Will also get CMET in AM. UA WNL and Urine Pregnancy was negative 4. Consider consult to endocrinology for methimazole dosing and long term management (patietn was previously being followed by endo, but was non-compliant with meds and follow up) 5. Repeat EKG, CXR, telemetry, vitals q 4 hrs 6. Will need tobacco cessation counseling as increases Sx of Grave's substantially  2. Hyperglycemia - pt had previous noted episode of hyperglycemia and GDM 1. Will get A1C and adjust diet accordingly 3. Tobacco Abuse - Will need tobacco cessation prior d/c  4. FEN/GI: NS 100 cc/hr.  Liquid diet and can advance tomorrow if remains stable overnight 5. Prophylaxis: Lovenox 40 mg qd  6. Disposition: Step down; transfer once HR stable off of dilt gtt  7. Code Status: Full  History of Present Illness: Kellie Shaffer is a 21 y.o. year old female with Grave's hyperthyroidism presenting with palpitations, shortness of breath, and dizziness starting this afternoon concerning for exacerbation of her Grave's Disease.  She  was at work around 3 PM and started to get palpitations.  Soon after, she developed dizziness and shortness of breath, and her boss sent her to the ED.  In the ED, an EKG was ordered showing AFib w/ RVR, a CBC was WNL, a BMP was WNL, A1C pending, TSH/T4 pending.  She was started on a diltiazem drip, Toprol XR, and given one dose of methimazole 10 mg as pt states she has not taken this medication in over 6 months.  Her HR initially on telemetry was in the 160-180's and was between the 80-90s ranged within the hour of starting the diltiazem and being given toprol.  Pt states that she was seeing endocrinology for the past year and they diagnosed her with Grave's hyperthyroidism, started her on methimazole, unsure of the dose.  She was following with them until she got pregnant back in April, and she was unsure what she was going to do regarding the pregnancy.  Pt was told to come back to the endocrinology practice after she had made her decision for abortion of her fetus.  She has not been on the medication since then and her last measured TSH was 0.008.    Pt does state she has had heat intolerance lately, but does deny hair loss, edema, cold intolerance, weight loss, tremor, sweating.  She has not had sick contacts recently and is an everyday smoker.    Patient Active Problem List  Diagnosis  . Hyperthyroidism  . Hyperglycemia  . Hypercholesteremia   Past Medical History: Past Medical History  Diagnosis Date  . Hyperthyroidism   . Migraine   .  Ovarian cyst   . Hypercholesteremia    Past Surgical History: History reviewed. No pertinent past surgical history. Social History: History  Substance Use Topics  . Smoking status: Current Every Day Smoker  . Smokeless tobacco: Not on file  . Alcohol Use: Yes   For any additional social history documentation, please refer to relevant sections of EMR.  Family History: Family History  Problem Relation Age of Onset  . Diabetes Other     Parent    . Cancer Other     Breast Cancer-Parent  . Hypertension Other     Parent  . Hyperlipidemia Other     Parent  . Arthritis Other     Parent   Allergies: No Known Allergies No current facility-administered medications on file prior to encounter.   Current Outpatient Prescriptions on File Prior to Encounter  Medication Sig Dispense Refill  . cetirizine (ZYRTEC) 10 MG tablet Take 10 mg by mouth daily.      . ferrous sulfate 325 (65 FE) MG tablet Take 325 mg by mouth every morning.      . fish oil-omega-3 fatty acids 1000 MG capsule Take 1 g by mouth 4 (four) times daily.       Review Of Systems: Per HPI with the following additions: None Otherwise 12 point review of systems was performed and was unremarkable.  Physical Exam: BP 114/99  Pulse 94  Temp 98.3 F (36.8 C) (Oral)  Resp 25  SpO2 100%  LMP 10/21/2011  Breastfeeding? Unknown Exam: General: NAD, WN/WD HEENT: + B/L Ophthalmoplegia, no lid lag, PERRLA, EOMI B/L, no scleral icterus, [place]/AT ,thyroid mildly enlarged without obvious goiter Cardiovascular: Irregularly irregular rhythm, No M/G/R Respiratory: CTAB, no wheezes Abdomen: Soft, Non tender, non distended  Extremities: No pretibial myxedema, +2 distal pulses B/L Skin: No rashes Neuro: AAO x 3, CN 2-12  Labs and Imaging: CBC BMET   Lab 05/20/12 1931  WBC 5.9  HGB 13.1  HCT 39.7  PLT 202    TSH - 0.008 according to endocrinology note  Repeat TSH/T4 pending  Lab 05/20/12 1931  NA 138  K 3.6  CL 105  CO2 22  BUN 5*  CREATININE 0.35*  GLUCOSE 103*  CALCIUM 10.1     Bryan R. Hess, DO of [facility] 05/20/2012, 9:15 PM   UPPER LEVEL ADDENDUM  I have seen and examined Kellie Shaffer with Dr. Paulina Fusi and I agree with the above assessment/plan. I have reviewed all available data and have made any necessary changes to the above H&P.  Gabriela Irigoyen 05/20/2012, 9:54 PM

## 2012-05-20 NOTE — ED Notes (Signed)
Admitting MD contacted regarding patient VS, stated to continue cardizem drip at this time, D/C if patient systolic BP decreases below 90. Reduce current infusion rate to 2.5 mg/h

## 2012-05-20 NOTE — ED Notes (Signed)
Pt here c/o palpitations and tachycardia with some SOB; pt HR is 168bpm at present; pt sts hx of hyperthyroid

## 2012-05-20 NOTE — ED Provider Notes (Addendum)
History     CSN: [id number]  Arrival date & time 05/20/12  1846   First MD Initiated Contact with Patient 05/20/12 1903      Chief Complaint  Patient presents with  . Tachycardia    (Consider location/radiation/quality/duration/timing/severity/associated sxs/prior treatment) The history is provided by the patient.   female had onset about 3 PM of a rapid, pounding heartbeat. She denies chest pain, heaviness, tightness, pressure. There's been some mild, intermittent dyspnea. She denies nausea, vomiting, diaphoresis. She's never had symptoms like this before. Nothing makes it better nothing makes it worse. Of note, she had been diagnosed with hyperthyroidism and given a prescription for methimazole, but had been advised not to start taking it so she is not on any medication for her hypothyroidism. She does not have a specific diagnosis of her hyperthyroidism.  Past Medical History  Diagnosis Date  . Hyperthyroidism   . Migraine   . Ovarian cyst   . Hypercholesteremia     History reviewed. No pertinent past surgical history.  Family History  Problem Relation Age of Onset  . Diabetes Other     Parent  . Cancer Other     Breast Cancer-Parent  . Hypertension Other     Parent  . Hyperlipidemia Other     Parent  . Arthritis Other     Parent    History  Substance Use Topics  . Smoking status: Current Every Day Smoker  . Smokeless tobacco: Not on file  . Alcohol Use: Yes    OB History    Grav Para Term Preterm Abortions TAB SAB Ect Mult Living   1               Review of Systems  All other systems reviewed and are negative.    Allergies  Review of patient's allergies indicates no known allergies.  Home Medications   Current Outpatient Rx  Name Route Sig Dispense Refill  . CETIRIZINE HCL 10 MG PO TABS Oral Take 10 mg by mouth daily.    Marland Kitchen FERROUS SULFATE 325 (65 FE) MG PO TABS Oral Take 325 mg by mouth every morning.    Marland Kitchen OMEGA-3 FATTY ACIDS 1000 MG PO CAPS  Oral Take 1 g by mouth 4 (four) times daily.    Marland Kitchen METHIMAZOLE 10 MG PO TABS Oral Take 1 tablet (10 mg total) by mouth 3 (three) times daily. 100 tablet 1    BP 144/100  Pulse 168  Temp 98.3 F (36.8 C) (Oral)  Resp 18  SpO2 99%  LMP 10/21/2011  Breastfeeding? Unknown  Physical Exam  Nursing note and vitals reviewed. 21 year old female, resting comfortably and in no acute distress. Vital signs are significant for tachycardia with heart rate of 168, and hypertension with blood pressure 144/100. Oxygen saturation is 99%, which is normal. Head is normocephalic and atraumatic. PERRLA, EOMI.She has moderate exophthalmos, but no abnormalities of extraocular movement. Good leg is present. Oropharynx is clear. Neck is nontender and supple without adenopathy or JVD. Thyroid is moderately enlarged and smooth. Back is nontender and there is no CVA tenderness. Lungs are clear without rales, wheezes, or rhonchi. Chest is nontender. Heart has regular rate and rhythm, tachycardic, without murmur. Abdomen is soft, flat, nontender without masses or hepatosplenomegaly and peristalsis is normoactive. Extremities have no cyanosis or edema, full range of motion is present. Skin is warm and dry without rash. Neurologic: Mental status is normal, cranial nerves are intact, there are no motor or sensory deficits.  ED Course  Procedures (including critical care time)  Results for orders placed during the hospital encounter of 05/20/12  CBC WITH DIFFERENTIAL      Component Value Range   WBC 5.9  4.0 - 10.5 K/uL   RBC 5.35 (*) 3.87 - 5.11 MIL/uL   Hemoglobin 13.1  12.0 - 15.0 g/dL   HCT 16.1  09.6 - 04.5 %   MCV 74.2 (*) 78.0 - 100.0 fL   MCH 24.5 (*) 26.0 - 34.0 pg   MCHC 33.0  30.0 - 36.0 g/dL   RDW 40.9  81.1 - 91.4 %   Platelets 202  150 - 400 K/uL   Neutrophils Relative PENDING  43 - 77 %   Neutro Abs PENDING  1.7 - 7.7 K/uL   Band Neutrophils PENDING  0 - 10 %   Lymphocytes Relative PENDING   12 - 46 %   Lymphs Abs PENDING  0.7 - 4.0 K/uL   Monocytes Relative PENDING  3 - 12 %   Monocytes Absolute PENDING  0.1 - 1.0 K/uL   Eosinophils Relative PENDING  0 - 5 %   Eosinophils Absolute PENDING  0.0 - 0.7 K/uL   Basophils Relative PENDING  0 - 1 %   Basophils Absolute PENDING  0.0 - 0.1 K/uL   WBC Morphology PENDING     RBC Morphology PENDING     Smear Review PENDING     nRBC PENDING  0 /100 WBC   Metamyelocytes Relative PENDING     Myelocytes PENDING     Promyelocytes Absolute PENDING     Blasts PENDING    POCT PREGNANCY, URINE      Component Value Range   Preg Test, Ur NEGATIVE  NEGATIVE     Date: 05/20/2012  Rate: 167  Rhythm: atrial flutter  QRS Axis: normal  Intervals: normal  ST/T Wave abnormalities: ST and T changes which are presumably rate related  Conduction Disutrbances:none  Narrative Interpretation: Atrial flutter with ST and T changes secondary to rapid rate. No prior ECG available for comparison.  Old EKG Reviewed: none available    1. Atrial fibrillation with rapid ventricular response   2. Hyperthyroidism    CRITICAL CARE Performed by: Dione Booze   Total critical care time: 40 minutes  Critical care time was exclusive of separately billable procedures and treating other patients.  Critical care was necessary to treat or prevent imminent or life-threatening deterioration.  Critical care was time spent personally by me on the following activities: development of treatment plan with patient and/or surrogate as well as nursing, discussions with consultants, evaluation of patient's response to treatment, examination of patient, obtaining history from patient or surrogate, ordering and performing treatments and interventions, ordering and review of laboratory studies, ordering and review of radiographic studies, pulse oximetry and re-evaluation of patient's condition.    MDM  Atrial flutter and atrial fibrillation secondary to hyperthyroidism.  The etiology for her hyperthyroidism seems most likely to be Graves' disease given her exophthalmos. She will be given beta blockers and calcium channel blockers to try to control rate. Thyroid functions have been drawn. She will need to get started on treatment for her hyperthyroidism. Since she had methimazole prescribed previously, she will be started on that in the emergency department. Prior records are reviewed and she had a TSH done at another facility which was undetectable. This was last May. Office notes suggest that she had not started on methimazole because of cost. Presumptive diagnosis was Graves' disease.  Prior to starting on diltiazem, she converted from atrial flutter to atrial fibrillation but still with a rapid ventricular response.  After IV diltiazem and oral metoprolol, her heart rate has come down to about 110. She was also given a dose of oral methimazole. Case is discussed with the resident on call for family practice and arrangements are made to get the patient to telemetry.      Dione Booze, MD 05/20/12 Alvina Filbert  Dione Booze, MD 05/21/12 [phone]

## 2012-05-20 NOTE — ED Notes (Signed)
Attempted to call report x1, asked to call back. PT currently awaiting admission orders for transport to floor.

## 2012-05-20 NOTE — ED Notes (Signed)
Pt BP and HR noted to decrease, page to admitting MD to notify. Pt denies pain, states she feels much better.

## 2012-05-21 ENCOUNTER — Encounter (HOSPITAL_COMMUNITY): Payer: Self-pay | Admitting: *Deleted

## 2012-05-21 LAB — COMPREHENSIVE METABOLIC PANEL
ALT: 16 U/L (ref 0–35)
AST: 13 U/L (ref 0–37)
Albumin: 3.3 g/dL — ABNORMAL LOW (ref 3.5–5.2)
Calcium: 9.6 mg/dL (ref 8.4–10.5)
GFR calc Af Amer: >90 mL/min (ref >90)
Glucose, Bld: 118 mg/dL — ABNORMAL HIGH (ref 70–99)
Sodium: 138 mEq/L (ref 135–145)
Total Protein: 6.9 g/dL (ref 6.0–8.3)

## 2012-05-21 LAB — CBC
HCT: 33.8 % — ABNORMAL LOW (ref 36.0–46.0)
Hemoglobin: 11 g/dL — ABNORMAL LOW (ref 12.0–15.0)
MCHC: 32.5 g/dL (ref 30.0–36.0)
MCV: 74.6 fL — ABNORMAL LOW (ref 78.0–100.0)

## 2012-05-21 LAB — TROPONIN I: Troponin I: <0.3 ng/mL (ref <0.30)

## 2012-05-21 LAB — TSH: TSH: <0.008 u[IU]/mL — ABNORMAL LOW (ref 0.350–4.500)

## 2012-05-21 MED ORDER — OFF THE BEAT BOOK
Freq: Once | Status: AC
  Administered 2012-05-21: 12:00:00
  Filled 2012-05-21 (×2): qty 1

## 2012-05-21 MED ORDER — DILTIAZEM HCL ER COATED BEADS 120 MG PO CP24
120.0000 mg | ORAL_CAPSULE | Freq: Every day | ORAL | Status: DC
  Administered 2012-05-21 – 2012-05-22 (×2): 120 mg via ORAL
  Filled 2012-05-21 (×2): qty 1

## 2012-05-21 NOTE — Progress Notes (Signed)
   CARE MANAGEMENT NOTE 05/21/2012  Patient:  Kellie Shaffer, Kellie Shaffer   Account Number:  [id number]  Date Initiated:  05/21/2012  Documentation initiated by:  [facility]  Subjective/Objective Assessment:   Hyperthyrodism     Action/Plan:   Meds Assistance   Anticipated DC Date:  05/23/2012   Anticipated DC Plan:  HOME/SELF CARE      DC Planning Services  CM consult  Medication Assistance      Choice offered to / List presented to:             Status of service:  In process, will continue to follow Medicare Important Message given?   (If response is "NO", the following Medicare IM given date fields will be blank) Date Medicare IM given:   Date Additional Medicare IM given:    Discharge Disposition:    Per UR Regulation:    If discussed at Long Length of Stay Meetings, dates discussed:    Comments:  05/21/2012 1500 Pt elig for ZZ fund here at [employer].  She can get 3 day supply of meds for free. Will need separate Rx to take to main pharmacy. Methimazole did not locate a pt assistance program for this med. They do have coupon online. Pt will need to go online to register to receive a discount at her local pharmacy. NCM will provided pt will a list of providers for PCP follow up. And a community discount card that will possibly assist with out of pocket medications. Pt can utilize [employer], [employer] or [employer] for discount on some generic medication. NCM spoke to pt and states she recently starting working a full-time job and her job sent her to hospital on Friday. She has to have 100 % attendance for training. NCM explained to pt discuss with d/c MD about work note or a return note. She wanted to try to work on Monday, if d/c Sunday. She has some thyroid medication prescribed by Dr. Everardo All. And she wanted to know if she can take that Rx, NCM educated her on the importance of discussing with the MD before she is discharged home. States she could not afford the out of pocket copay when  she returned to see Dr Everardo All to get ok to take thyroid meds. Her insurance will start in approx 85 days so she will be able to get medications. Isidoro Donning RN CCM Case Mgmt phone [phone]

## 2012-05-21 NOTE — Progress Notes (Signed)
Subjective: Feels better this morning- no more palpitations or SOB. Only complaint is that she didn't sleep well last night.   Objective: Vital signs in last 24 hours: Temp:  [98 F (36.7 C)-98.9 F (37.2 C)] 98 F (36.7 C) (10/05 0406) Pulse Rate:  [53-168] 72  (10/05 0406) Resp:  [18-29] 21  (10/05 0406) BP: (99-178)/(53-100) 99/53 mmHg (10/05 0406) SpO2:  [98 %-100 %] 99 % (10/05 0406) Weight:  [212 lb 1.3 oz (96.2 kg)-216 lb 0.8 oz (98 kg)] 216 lb 0.8 oz (98 kg) (10/05 0406) Weight change:  Last BM Date: 05/20/12  Intake/Output from previous day: 10/04 0701 - 10/05 0700 In: 340 [P.O.:120; I.V.:220] Out: -  Intake/Output this shift:    PHYSICAL EXAM:  Gen: NAD, sitting in bed comfortably, eating breakfast  HEENT: MMM CV: regular rate and rhythm, no murmur Pulm: clear bilaterally, no wheezes Abd: soft, NT Ext: warm, dry, no rash  Lab Results:  Basename 05/21/12 0600 05/20/12 1931  WBC 5.2 5.9  HGB 11.0* 13.1  HCT 33.8* 39.7  PLT 183 202   BMET  Basename 05/20/12 1931  NA 138  K 3.6  CL 105  CO2 22  GLUCOSE 103*  BUN 5*  CREATININE 0.35*  CALCIUM 10.1    Studies/Results: X-ray Chest Pa And Lateral   05/20/2012  *RADIOLOGY REPORT*  Clinical Data: Tachycardia.  Smoker.  CHEST - 2 VIEW  Comparison: 10/11/2009.  Findings: Borderline enlarged cardiac silhouette with an interval increase in size, magnified by a decreased depth of inspiration. Clear lungs with normal vascularity.  Minimal scoliosis.  IMPRESSION: Interval borderline cardiomegaly.  No acute abnormality.   Original Report Authenticated By: Darrol Angel, M.D.     Medications:  I have reviewed the patient's current medications. Scheduled:   . diltiazem (CARDIZEM) infusion  5 mg/hr Intravenous Once  . diltiazem  20 mg Intravenous Once  . enoxaparin (LOVENOX) injection  40 mg Subcutaneous Q24H  . methimazole  10 mg Oral To ER  . methimazole  10 mg Oral To ER  . methimazole  20 mg Oral TID  .  metoprolol tartrate  50 mg Oral BID  . metoprolol tartrate  50 mg Oral Once  . DISCONTD: adenosine (ADENOCARD) IV  6 mg Intravenous Once   Continuous:   . sodium chloride 100 mL/hr (05/21/12 0222)  . diltiazem (CARDIZEM) infusion 5 mg/hr (05/21/12 0330)   ZOX:[id number], acetaminophen, ondansetron (ZOFRAN) IV, ondansetron  Assessment/Plan: 21yo non-compliant F with known hyperthyroidism p/w a fib RVR, thyroid likely cause of arrhythmia  1. Tachycardia: improved -as of this morning, in NSR, HR @ 60s-70s -S/p dilt load, currently on dilt gtt @ 5 (was 2.5 most of the night overnight) -S/p toprol XL 50mg  in ED last night -Transition to po diltiazem- start at 120mg  XR, d/c dilt gtt 1 hour after administration- monitor for tachycardia or return of a fib/flutter; consider increase to 180mg  tomorrow AM if still having tachycardia -CXR normal  -Repeat EKG this AM pending to confirm NSR -Check troponin x1 as pt with some inverted T-waves and likely demand ischemia from significant tachycardia  2. Hyperthyroidism: patient reports noncompliance; TSH <0.008, free T4: 5.15 -Continue methimazole 20mg , once TSH and T4 become slightly more normal, could consider decreasing dose for maintenance, but this will not be done until the outpatient setting  3. Tobacco abuse: pt reports 0-2 cig/day -Smoking cessation counseling -Pt declines nicotine patch   FEN/GI: regular diet today since has remained stable, SLIV this afternoon if eating  well Ppx: SQ Lovenox Dispo: transfer to floor this afternoon if remains with NSR, or at least HR <100, while off dilt gtt   LOS: 1 day   Kellie Shaffer 05/21/2012, 7:03 AM

## 2012-05-21 NOTE — H&P (Signed)
FMTS Attending Admission Note: Kellie Don MD Personal pager:  [phone] FPTS Service Pager:  [phone]  I  have seen and examined this patient, reviewed their chart. I have discussed this patient with the resident. I agree with the resident's findings, assessment and care plan.  21 yo Female recently moved to [place] from [place], [place] with history of hyperthyroidism presenting with palpitations.  Came to ED and found to be in SVT, most likely atrial fibrillation.  Started on Dilt drip and restarted Methimazole, patient had stopped taking this after becoming pregnant.  Recent abortion due to concerns for safety of her child secondary to her illness.  Some nausea and shortness of breath, no LE edema or chest pain with palpitations.    This AM, much improved.  Had trouble sleeping overnight.  Eating and drinking well.  EKG shows normal sinus rhythm.  PE: Gen:  Alert, cooperative patient who appears stated age in no acute distress.  Vital signs reviewed. HEENT:  /AT.  Exophthalmus noted.  MMM Heart:  RRR without murmur Lungs:  CTAB Ext:  No clubbing/cyanosis/erythema.  No edema noted bilateral lower extremities.    Imp/Plan: 1.  Hyperthyroidism:  - does not appear to be thyroid storm.   - symptoms most concerning were tachyrhythmias. - resolved s/p diltiazem drip and metoprolol provided in ED - restarted on methimazole. - Confirmed by labs  2.  Atrial tachycardia: - Currently resolved by examination and AM EKG - converting to PO Dilt today.  - will transfer to floor and observe overnight, if maintaining normal rate and rhythm with PO dilt plan for DC tomorrow AM  3.  Dispo:   - currently being followed at [facility].  We can pick her up as one of our patients.  - need to address whether Cardizem is an affordable medication for her as she is without insurance.

## 2012-05-21 NOTE — Progress Notes (Signed)
FMTS Attending Daily Note:  Renold Don MD  [phone] pager  Family Practice pager:  [phone] I have seen and examined this patient and have reviewed their chart. I have discussed this patient with the resident. I agree with the resident's findings, assessment and care plan.   Please see admit note for further details.

## 2012-05-21 NOTE — Progress Notes (Signed)
Pt in NSR x1hr. Notified Dr. Casper Harten. Discussed pt's conditions. Will keep Cardizem gtt at 5mg /hr.

## 2012-05-21 NOTE — Progress Notes (Signed)
The patient was resting comfortably vitals sign stable. Report called to Marylu Lund and patient transferred without incident to 3 west room 8.

## 2012-05-21 NOTE — Progress Notes (Addendum)
Nutrition Brief Note:  RD pulled to pt from MST (Malnutrition Screening Tool) report. Pt indicates unintentional weight loss of unknown amount.   Pt states she lost weight after moving out on her own, no longer eating her mothers cooking. Thinks she is eating healthier now. Does not know amount of weight lost, but has not had a change in clothing size.  Wt Readings from Last 5 Encounters:  05/21/12 216 lb 0.8 oz (98 kg)  01/15/12 220 lb (99.791 kg)  01/02/12 221 lb 9.6 oz (100.517 kg)  01/01/12 221 lb (100.245 kg)  06/27/11 221 lb (100.245 kg)  Body mass index is 31.91 kg/(m^2).Pt is obese class 1 per current BMI   Pt has a good appetite. Cracker and peanut butter wrappers at bedside.   Current Diet: Regular      Po intake:not documented   Denies any other needs at this time. Chart reviewed, no other nutrition interventions warranted at this time. Please consult as needed.   Clarene Duke RD, LDN Pager [phone] After Hours pager [phone]

## 2012-05-21 NOTE — Progress Notes (Signed)
Family Medicine Teaching Service Interim Progress Note /  Transfer Note Service Page: [phone]   Pt has been converted to po diltiazem X ~4 hours.  Tolerating well.  Currently NSR and asymptomatic.  Will transfer to telemetry bed. No change to Methimazole.  Andrena Mews, DO [facility] Resident - PGY-2 05/21/2012 2:43 PM

## 2012-05-22 ENCOUNTER — Inpatient Hospital Stay (HOSPITAL_COMMUNITY): Payer: Self-pay

## 2012-05-22 MED ORDER — METHIMAZOLE 10 MG PO TABS: 20.0000 mg | ORAL_TABLET | Freq: Three times a day (TID) | ORAL | Status: DC

## 2012-05-22 MED ORDER — DILTIAZEM HCL ER COATED BEADS 120 MG PO CP24: 120.0000 mg | ORAL_CAPSULE | Freq: Every day | ORAL | Status: DC

## 2012-05-22 NOTE — Care Management Note (Signed)
    Page 1 of 2   05/22/2012     5:53:58 PM   CARE MANAGEMENT NOTE 05/22/2012  Patient:  Kellie Shaffer, Kellie Shaffer   Account Number:  [id number]  Date Initiated:  05/21/2012  Documentation initiated by:  [facility]  Subjective/Objective Assessment:   Hyperthyrodism     Action/Plan:   Meds Assistance   Anticipated DC Date:  05/23/2012   Anticipated DC Plan:  HOME/SELF CARE      DC Planning Services  CM consult  Medication Assistance      Choice offered to / List presented to:             Status of service:  Completed, signed off Medicare Important Message given?   (If response is "NO", the following Medicare IM given date fields will be blank) Date Medicare IM given:   Date Additional Medicare IM given:    Discharge Disposition:  HOME/SELF CARE  Per UR Regulation:  Reviewed for med. necessity/level of care/duration of stay  If discussed at Long Length of Stay Meetings, dates discussed:    Comments:  05/22/2012 1430 3 day Rx to main pharmacy to be filled. Isidoro Donning RN CCM Case Mgmt phone [phone]  05/21/2012 1500 Pt elig for ZZ fund here at [employer].  She can get 3 day supply of meds for free. Will need separate Rx to take to main pharmacy. Methimazole did not locate a pt assistance program for this med. They do have coupon online. Pt will need to go online to register to receive a discount at her local pharmacy. NCM will provided pt will a list of providers for PCP follow up. And a community discount card that will possibly assist with out of pocket medications. Pt can utilize [employer], [employer] or [employer] for discount on some generic medication. NCM spoke to pt and states she recently starting working a full-time job and her job sent her to hospital on Friday. She has to have 100 % attendance for training. NCM explained to pt discuss with d/c MD about work note or a return note. She wanted to try to work on Monday, if d/c Sunday. She has some thyroid medication prescribed by  Dr. Everardo All. And she wanted to know if she can take that Rx, NCM educated her on the importance of discussing with the MD before she is discharged home. States she could not afford the out of pocket copay when she returned to see Dr Everardo All to get ok to take thyroid meds. Her insurance will start in approx 85 days so she will be able to get medications. Isidoro Donning RN CCM Case Mgmt phone [phone]

## 2012-05-22 NOTE — Progress Notes (Signed)
FMTS Attending Daily Note:  Renold Don MD  [phone] pager  Family Practice pager:  [phone] I have seen and examined this patient and have reviewed their chart. I have discussed this patient with the resident. I agree with the resident's findings, assessment and care plan.  Much better today subjectively.  Did have recorded elevated temperature, not true fever.  No confusion, abdominal pain, palpitations.  Not thyroid storm.  Plan for DC home today with close outpatient follow up.  Patient with limited means, will prescribe 3 day course of Methimazole.  FU with [place] in next 2-3 days, will set her up with coupon for methimazole versus considering switch to propylthiouracil if this is cheaper outpatient.  Will need to follow up with endocrinology as well.  FU results of thyroid nodule on outpt basis.

## 2012-05-22 NOTE — Discharge Summary (Signed)
**Note Kellie-Identified via Obfuscation** Family Medicine Teaching [facility] Discharge Summary  Patient name: Kellie Shaffer Medical record number: [id number] Date of birth: 12/26/1990 Age: 21 y.o. Gender: female Date of Admission: 05/20/2012  Date of Discharge: 05/22/2012 Admitting Physician: Tobey Grim, MD  Primary Care Provider: new patient to [facility] - Dr. Berline Chough  Indication for Hospitalization:  1. Tachycardia, palpitations 2. Hx Grave's Hyperthyroidism  Discharge Diagnoses:  1. 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 Hyperglycemia 3. Tobacco Use  Significant Labs and Imaging:   1. TSH:  < 0.008 2. T4: 5.15 3. THYROID ULTRASOUND: Diffusely heterogeneous thyroid tissue with ill-defined nodularity most consistent with multinodular goiter. 4. Hemoglobin A1c: 6.3 5. UA negative, Urine pregnancy negative  Brief Hospital Course:  Kellie Shaffer is a 21 y.o. year old female w/ PMHx for Grave's hyperthyroidsim presenting with palpations, shortness of breath, and dizziness. 1. 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  Started on Diltiazem drip and received one dose Toprol 50 mg in ED.  Rate controlled on Diltiazem drip, then transitioned to PO Diltiazem 120 mg XL daily.  She was monitored one telemetry without any significant events.  EKG on 10/5 - NSR.  Troponin negative x 1.  2. Hx Grave's Hyperthyroidism: See lab results above.  We started Methimazole 20 mg TID.  Patient was previously seen by endocrinology, but then she became pregnant and stopped going to specialist.  Patient was also non-compliant with medications and unsure of Methimazole dose.  Will give patient 3 day supply of Methimazole free of charge and Rx for 30 day supply (patient to print out coupon).  Patient to schedule an appointment with Dr. Berline Chough in 2-3 days and establish care at our practice.  Consider consult to endocrinology for  methimazole dosing and long term management. 3. Hyperglycemia: pt had previous noted episode of hyperglycemia and GDM.  Hemoglobin A1c 6.3.  Will defer to outpatient management. 4. Tobacco Abuse - Patient declined nicotine patch.  Smoking cessation counseling given. 5. Systolic Ejection Murmur: New murmur appreciated per Dr. Berline Chough on day of discharge which was not appreciated on day of admission.  Patient to follow up with new PCP.  Consider 2D Echo outpatient if murmur still present. 6. Elevated Temperature: Temperature max 100.2.  No other signs or symptoms of thyroid storm at time of discharge.  Recheck temperature outpatient.  Discharge Medications:    Medication List     As of 05/22/2012  2:05 PM    TAKE these medications         cetirizine 10 MG tablet   Commonly known as: ZYRTEC   Take 10 mg by mouth daily.      diltiazem 120 MG 24 hr capsule   Commonly known as: CARDIZEM CD   Take 1 capsule (120 mg total) by mouth daily.      ferrous sulfate 325 (65 FE) MG tablet   Take 325 mg by mouth every morning.      fish oil-omega-3 fatty acids 1000 MG capsule   Take 1 g by mouth 4 (four) times daily.      methimazole 10 MG tablet   Commonly known as: TAPAZOLE   Take 2 tablets (20 mg total) by mouth 3 (three) times daily.       Issues for Follow Up:  Please refer to Hospital Course above.  Discharge Instructions: Please refer to Patient Instructions section of EMR for full details.  Patient was counseled important  signs and symptoms that should prompt return to medical care, changes in medications, dietary instructions, activity restrictions, and follow up appointments.  Significant instructions noted below:      Follow-up Information    Follow up with RIGBY, MICHAEL, DO. Schedule an appointment as soon as possible for a visit in 2 days. (Please call our clinic tomorrow to schedule appointment for Dr. Berline Chough or Dr. Fara Boros in 2-3 days.)    Contact information:   [address]  [place] [address] [phone]          Discharge Condition: medically stable for discharge home.  Kellie LA CRUZ,Kory Rains, DO 05/22/2012, 2:05 PM

## 2012-05-22 NOTE — Progress Notes (Signed)
Subjective: Feels better this morning- no more palpitations or SOB. Reports febrile but no other complaints.  No URI like symptoms, no dysuria, no hematuria, no body aches. No focal complaints.  Objective: Vital signs in last 24 hours: Temp:  [98.4 F (36.9 C)-100.2 F (37.9 C)] 100.2 F (37.9 C) (10/06 0500) Pulse Rate:  [85-96] 90  (10/06 0500) Resp:  [18-24] 18  (10/06 0500) BP: (97-144)/(35-80) 131/73 mmHg (10/06 0500) SpO2:  [98 %-100 %] 98 % (10/06 0500) Weight:  [218 lb 9.6 oz (99.156 kg)] 218 lb 9.6 oz (99.156 kg) (10/05 1645) Weight change: 6 lb 8.3 oz (2.956 kg) Last BM Date: 05/20/12  Intake/Output from previous day: 10/05 0701 - 10/06 0700 In: 997.5 [P.O.:480; I.V.:517.5] Out: -  Intake/Output this shift:    PHYSICAL EXAM:  Gen: NAD, sitting in bed comfortably, HEENT: MMM CV: regular rate and rhythm, II/VI SEM heard best at 2nd intercostal space R sternal boarder, radiating to carotids, +thyroid bruits Pulm: clear bilaterally, no wheezes Abd: soft, NT Ext: Moves all 4 extremities spontaneously, warm well perfused, no edema, bilateral DP and PT pulses 2/4.   Skin: diaphoretic  Lab Results:  Basename 05/21/12 0600 05/20/12 1931  WBC 5.2 5.9  HGB 11.0* 13.1  HCT 33.8* 39.7  PLT 183 202   BMET  Basename 05/21/12 0600 05/20/12 1931  NA 138 138  K 3.8 3.6  CL 105 105  CO2 23 22  GLUCOSE 118* 103*  BUN 9 5*  CREATININE 0.35* 0.35*  CALCIUM 9.6 10.1    Studies/Results: X-ray Chest Pa And Lateral   05/20/2012  *RADIOLOGY REPORT*  Clinical Data: Tachycardia.  Smoker.  CHEST - 2 VIEW  Comparison: 10/11/2009.  Findings: Borderline enlarged cardiac silhouette with an interval increase in size, magnified by a decreased depth of inspiration. Clear lungs with normal vascularity.  Minimal scoliosis.  IMPRESSION: Interval borderline cardiomegaly.  No acute abnormality.   Original Report Authenticated By: Darrol Angel, M.D.     Medications:  I have reviewed the  patient's current medications. Scheduled:    . diltiazem  120 mg Oral Daily  . enoxaparin (LOVENOX) injection  40 mg Subcutaneous Q24H  . methimazole  20 mg Oral TID  . off the beat book   Does not apply Once  . DISCONTD: metoprolol tartrate  50 mg Oral BID   Continuous:    . diltiazem (CARDIZEM) infusion 5 mg/hr (05/21/12 0330)  . DISCONTD: sodium chloride 100 mL/hr at 05/21/12 1153   AVW:[id number], acetaminophen, ondansetron (ZOFRAN) IV, ondansetron  Assessment/Plan: 21yo non-compliant F with known hyperthyroidism p/w a fib RVR, thyroid likely cause of arrhythmia   #   Tachycardia improved; A Fib/Fluttter resolved  -as of this morning, in NSR, HR @ 80-90s, no overnight events on telemetry -on DiltXL 120mg ; s/p dilt drip; S/p toprol XL 50mg  in ED X1 -CXR normal  -normal troponins -on telemetry bed  #    Elevated Temperature - not febrile @ 100.2oF  -current setting consider worsening thyroid storm  #    Systolic Ejection Murmur  - new this morning with radiation to carotids, likely flow murmur - will continue to monitor and see if resolution with defervescence - if not consider 2D ECHO - continue telemetry  #    Hyperthyroidism: patient reports noncompliance; TSH <0.008, free T4: 5.15 -Continue methimazole 20mg , once TSH and T4 become slightly more normal, could consider decreasing dose for maintenance, but this will not be done until the outpatient setting - Thyroid nodule  on exam: will obtain Thyroid US  #    Tobacco abuse: pt reports 0-2 cig/day -Smoking cessation counseling -Pt declines nicotine patch   FEN/GI: regular diet Ppx: SQ Lovenox Dispo: Was planning on discharge this morning however given pt elevating temperature will continue to trend.  Concerned for thyroid storm given no focal symptoms or PE findings of infection.  Thyroid US pending.  Continue medications.   LOS: 2 days    Andrena Mews, DO [facility] Resident -  PGY-2 05/22/2012 8:33 AM

## 2012-05-29 NOTE — Discharge Summary (Signed)
Family Medicine Teaching Service  Discharge Note : Attending Jeff Maebel Marasco MD Pager [phone] Inpatient Team Pager:  [phone]  I have seen and examined this patient, reviewed their chart and discussed discharge planning with the resident at the time of discharge. I agree with the discharge plan as above.  

## 2012-08-31 ENCOUNTER — Emergency Department (HOSPITAL_COMMUNITY)
Admission: EM | Admit: 2012-08-31 | Discharge: 2012-08-31 | Disposition: A | Discharge status: Home / Self Care | Payer: Self-pay | Attending: Emergency Medicine | Admitting: Emergency Medicine

## 2012-08-31 ENCOUNTER — Emergency Department (HOSPITAL_COMMUNITY): Payer: Self-pay

## 2012-08-31 ENCOUNTER — Encounter (HOSPITAL_COMMUNITY): Payer: Self-pay | Admitting: Emergency Medicine

## 2012-08-31 DIAGNOSIS — L02219 Cutaneous abscess of trunk, unspecified: Secondary | ICD-10-CM | POA: Insufficient documentation

## 2012-08-31 DIAGNOSIS — R059 Cough, unspecified: Secondary | ICD-10-CM | POA: Insufficient documentation

## 2012-08-31 DIAGNOSIS — E7800 Pure hypercholesterolemia, unspecified: Secondary | ICD-10-CM | POA: Insufficient documentation

## 2012-08-31 DIAGNOSIS — Z79899 Other long term (current) drug therapy: Secondary | ICD-10-CM | POA: Insufficient documentation

## 2012-08-31 DIAGNOSIS — D649 Anemia, unspecified: Secondary | ICD-10-CM | POA: Insufficient documentation

## 2012-08-31 DIAGNOSIS — R05 Cough: Secondary | ICD-10-CM

## 2012-08-31 DIAGNOSIS — Z8742 Personal history of other diseases of the female genital tract: Secondary | ICD-10-CM | POA: Insufficient documentation

## 2012-08-31 DIAGNOSIS — E0590 Thyrotoxicosis, unspecified without thyrotoxic crisis or storm: Secondary | ICD-10-CM | POA: Insufficient documentation

## 2012-08-31 DIAGNOSIS — F17200 Nicotine dependence, unspecified, uncomplicated: Secondary | ICD-10-CM | POA: Insufficient documentation

## 2012-08-31 MED ORDER — BENZONATATE 100 MG PO CAPS: 100.0000 mg | ORAL_CAPSULE | Freq: Three times a day (TID) | ORAL | Status: DC

## 2012-08-31 NOTE — ED Notes (Signed)
Pt c/o cough x 3 weeks with fatigue; pt sts abscess in groin area x several days

## 2012-08-31 NOTE — ED Provider Notes (Signed)
History     CSN: [id number]  Arrival date & time 08/31/12  1247   First MD Initiated Contact with Patient 08/31/12 1333      Chief Complaint  Patient presents with  . Cough  . Abscess    (Consider location/radiation/quality/duration/timing/severity/associated sxs/prior treatment) Patient is a 22 y.o. female presenting with cough and abscess. The history is provided by the patient. No language interpreter was used.  Cough This is a new problem. Episode onset: 3 weeks. The problem occurs hourly. The problem has not changed since onset.The cough is non-productive. There has been no fever. Pertinent negatives include no chest pain, no chills, no sweats, no ear congestion, no ear pain, no headaches, no rhinorrhea, no sore throat, no myalgias, no shortness of breath, no wheezing and no eye redness. She has tried decongestants and cough syrup for the symptoms. The treatment provided mild relief. She is a smoker. Her past medical history does not include pneumonia, COPD or asthma.  Abscess  This is a new problem. The current episode started yesterday. The onset was gradual. The problem occurs rarely. The problem has been unchanged. The abscess is present on the groin. The problem is mild. The abscess is characterized by painfulness. Associated symptoms include cough. Pertinent negatives include no rhinorrhea and no sore throat.      Past Medical History  Diagnosis Date  . Hyperthyroidism   . Hypercholesteremia   . Ovarian cyst   . Anemia   . Abortion May 2013    History reviewed. No pertinent past surgical history.  Family History  Problem Relation Age of Onset  . Diabetes Other     Parent  . Cancer Other     Breast Cancer-Parent  . Hypertension Other     Parent  . Hyperlipidemia Other     Parent  . Arthritis Other     Parent    History  Substance Use Topics  . Smoking status: Current Some Day Smoker -- 0.2 packs/day    Types: Cigarettes  . Smokeless tobacco: Not on file   . Alcohol Use: 0.0 oz/week    0 Glasses of wine, 0 Shots of liquor per week    OB History    Grav Para Term Preterm Abortions TAB SAB Ect Mult Living   1               Review of Systems  Constitutional: Negative for chills.       10 Systems reviewed and all are negative for acute change except as noted in the HPI.   HENT: Negative for ear pain, sore throat and rhinorrhea.   Eyes: Negative for redness.  Respiratory: Positive for cough. Negative for shortness of breath and wheezing.   Cardiovascular: Negative for chest pain.  Musculoskeletal: Negative for myalgias.  Neurological: Negative for headaches.    Allergies  Benadryl  Home Medications   Current Outpatient Rx  Name  Route  Sig  Dispense  Refill  . DILTIAZEM HCL ER COATED BEADS 120 MG PO CP24   Oral   Take 1 capsule (120 mg total) by mouth daily.   30 capsule   1   . FERROUS SULFATE 325 (65 FE) MG PO TABS   Oral   Take 325 mg by mouth every morning.         Marland Kitchen OMEGA-3 FATTY ACIDS 1000 MG PO CAPS   Oral   Take 1 g by mouth daily.          Marland Kitchen METHIMAZOLE  10 MG PO TABS   Oral   Take 2 tablets (20 mg total) by mouth 3 (three) times daily.   18 tablet   0     BP 162/84  Pulse 110  Temp 99.5 F (37.5 C) (Oral)  Resp 16  SpO2 100%  LMP 10/21/2011  Physical Exam  Nursing note and vitals reviewed. Constitutional: She appears well-nourished.  HENT:  Head: Normocephalic and atraumatic.  Right Ear: External ear normal.  Left Ear: External ear normal.  Nose: Nose normal.  Mouth/Throat: Oropharynx is clear and moist. No oropharyngeal exudate.  Eyes: Conjunctivae normal are normal.       Oxopthalmos, bilaterally  Neck: Neck supple. No JVD present.  Cardiovascular: Normal rate and regular rhythm.  Exam reveals no gallop and no friction rub.   No murmur heard. Pulmonary/Chest: Effort normal and breath sounds normal. No respiratory distress. She has no wheezes. She has no rales. She exhibits no  tenderness.  Abdominal: Soft. There is no tenderness.  Genitourinary:       Chaperone present:  L outer labia with a small nodule, ttp, no fluctuance.  Likely ingrown hair.  No redness  Musculoskeletal: She exhibits no edema.  Lymphadenopathy:    She has no cervical adenopathy.  Neurological: She is alert.    ED Course  Procedures (including critical care time)  Dg Chest 2 View  08/31/2012  *RADIOLOGY REPORT*  Clinical Data: Cough and shortness of breath.  CHEST - 2 VIEW  Comparison: Two-view chest 05/20/2012  Findings: The heart size is normal.  The lungs are clear.  The visualized soft tissues and bony thorax are unremarkable.  IMPRESSION: Negative chest.   Original Report Authenticated By: Marin Roberts, M.D.      1. Cough 2. Ingrown hair, L groin  MDM  Pt with persistent dry cough x 3 weeks.  No other URI sxs.  No SOB.  Low suspicion for PE based on Wells Criteria.  Lung CTAB.  Not taking any medication that can induce cough.  Will obtain CXR.  Pt also has an ingrown hair to L groin, not an abscess amenable for I&D.  Recommend warm compress and to return if worsen.   3:05 PM CXR unremarkable.  Will d/c with cough medication.  Return precaution for potential abscess formation of L groin.  Pt voice understanding and agrees with plan.    BP 162/84  Pulse 110  Temp 99.5 F (37.5 C) (Oral)  Resp 16  SpO2 100%  LMP 08/03/2012  I have reviewed nursing notes and vital signs. I personally reviewed the imaging tests through PACS system  I reviewed available ER/hospitalization records thought the EMR    Fayrene Helper, PA-C 08/31/12 1506

## 2012-08-31 NOTE — ED Provider Notes (Signed)
Medical screening examination/treatment/procedure(s) were performed by non-physician practitioner and as supervising physician I was immediately available for consultation/collaboration.   Marji Kuehnel, MD 08/31/12 1546 

## 2012-09-12 ENCOUNTER — Emergency Department (HOSPITAL_COMMUNITY)
Admission: EM | Admit: 2012-09-12 | Discharge: 2012-09-12 | Disposition: A | Discharge status: Home / Self Care | Payer: Self-pay | Attending: Emergency Medicine | Admitting: Emergency Medicine

## 2012-09-12 ENCOUNTER — Encounter (HOSPITAL_COMMUNITY): Payer: Self-pay | Admitting: *Deleted

## 2012-09-12 DIAGNOSIS — Z79899 Other long term (current) drug therapy: Secondary | ICD-10-CM | POA: Insufficient documentation

## 2012-09-12 DIAGNOSIS — F17200 Nicotine dependence, unspecified, uncomplicated: Secondary | ICD-10-CM | POA: Insufficient documentation

## 2012-09-12 DIAGNOSIS — E7800 Pure hypercholesterolemia, unspecified: Secondary | ICD-10-CM | POA: Insufficient documentation

## 2012-09-12 DIAGNOSIS — N764 Abscess of vulva: Secondary | ICD-10-CM | POA: Insufficient documentation

## 2012-09-12 DIAGNOSIS — Z8742 Personal history of other diseases of the female genital tract: Secondary | ICD-10-CM | POA: Insufficient documentation

## 2012-09-12 DIAGNOSIS — D649 Anemia, unspecified: Secondary | ICD-10-CM | POA: Insufficient documentation

## 2012-09-12 DIAGNOSIS — L0291 Cutaneous abscess, unspecified: Secondary | ICD-10-CM

## 2012-09-12 DIAGNOSIS — E039 Hypothyroidism, unspecified: Secondary | ICD-10-CM | POA: Insufficient documentation

## 2012-09-12 MED ORDER — OXYCODONE-ACETAMINOPHEN 5-325 MG PO TABS: 1.0000 | ORAL_TABLET | Freq: Four times a day (QID) | ORAL | Status: DC | PRN

## 2012-09-12 MED ORDER — OXYCODONE-ACETAMINOPHEN 5-325 MG PO TABS
1.0000 | ORAL_TABLET | Freq: Once | ORAL | Status: AC
  Administered 2012-09-12: 1 via ORAL
  Filled 2012-09-12: qty 1

## 2012-09-12 MED ORDER — SULFAMETHOXAZOLE-TRIMETHOPRIM 800-160 MG PO TABS: 1.0000 | ORAL_TABLET | Freq: Two times a day (BID) | ORAL | Status: DC

## 2012-09-12 NOTE — ED Provider Notes (Signed)
Medical screening examination/treatment/procedure(s) were performed by non-physician practitioner and as supervising physician I was immediately available for consultation/collaboration.   Glynn Octave, MD 09/12/12 2100

## 2012-09-12 NOTE — ED Provider Notes (Signed)
History     CSN: [id number]  Arrival date & time 09/12/12  1814   First MD Initiated Contact with Patient 09/12/12 1829      Chief Complaint  Patient presents with  . Abscess    (Consider location/radiation/quality/duration/timing/severity/associated sxs/prior treatment) HPI Comments: Patient presents with complaint of abscess in the left groin that is been worsening for the past week. She complains of pain. No other symptoms in her lower extremity. No pain with defecation. Patient denies fever, no nausea, vomiting, or diarrhea. Of note, patient's temperature is 99.14F in emergency department. No treatments prior to arrival. Onset gradual. Course is constant. Nothing makes symptoms better. Palpation makes symptoms worse. Patient has never had an abscess before.  Patient is a 22 y.o. female presenting with abscess. The history is provided by the patient.  Abscess  Pertinent negatives include no fever and no vomiting.    Past Medical History  Diagnosis Date  . Hyperthyroidism   . Hypercholesteremia   . Ovarian cyst   . Anemia   . Abortion May 2013    History reviewed. No pertinent past surgical history.  Family History  Problem Relation Age of Onset  . Diabetes Other     Parent  . Cancer Other     Breast Cancer-Parent  . Hypertension Other     Parent  . Hyperlipidemia Other     Parent  . Arthritis Other     Parent    History  Substance Use Topics  . Smoking status: Current Some Day Smoker -- 0.2 packs/day    Types: Cigarettes  . Smokeless tobacco: Not on file  . Alcohol Use: 0.0 oz/week    0 Glasses of wine, 0 Shots of liquor per week    OB History    Grav Para Term Preterm Abortions TAB SAB Ect Mult Living   1               Review of Systems  Constitutional: Negative for fever.  Gastrointestinal: Negative for nausea, vomiting and blood in stool.  Skin: Positive for color change.       Positive for abscess.  Hematological: Negative for adenopathy.     Allergies  Benadryl  Home Medications   Current Outpatient Rx  Name  Route  Sig  Dispense  Refill  . DILTIAZEM HCL ER COATED BEADS 120 MG PO CP24   Oral   Take 1 capsule (120 mg total) by mouth daily.   30 capsule   1   . FERROUS SULFATE 325 (65 FE) MG PO TABS   Oral   Take 325 mg by mouth every morning.         Marland Kitchen OMEGA-3 FATTY ACIDS 1000 MG PO CAPS   Oral   Take 1 g by mouth daily.          Marland Kitchen METHIMAZOLE 10 MG PO TABS   Oral   Take 2 tablets (20 mg total) by mouth 3 (three) times daily.   18 tablet   0     BP 136/83  Pulse 103  Temp 99.8 F (37.7 C)  Resp 16  SpO2 100%  LMP 08/03/2012  Physical Exam  Nursing note and vitals reviewed. Constitutional: She appears well-developed and well-nourished.  HENT:  Head: Normocephalic and atraumatic.  Eyes: Conjunctivae normal are normal.  Neck: Normal range of motion. Neck supple.  Pulmonary/Chest: No respiratory distress.  Neurological: She is alert.  Skin: Skin is warm and dry.       Large  abscess on L labia extending to crease between labia and buttocks. There is some mild surrounding erythema concerning for cellulitis.   Psychiatric: She has a normal mood and affect.    ED Course  Procedures (including critical care time)  Labs Reviewed - No data to display No results found.   1. Abscess     6:52 PM Patient seen and examined. Will I&D abscess.   Vital signs reviewed and are as follows: Filed Vitals:   09/12/12 1818  BP: 136/83  Pulse: 103  Temp: 99.8 F (37.7 C)  Resp: 16   INCISION AND DRAINAGE Performed by: Carolee Rota Consent: Verbal consent obtained. Risks and benefits: risks, benefits and alternatives were discussed Type: abscess  Body area: Left labia, buttocks  Anesthesia: local infiltration  Incision was made with a scalpel.  Local anesthetic: lidocaine 2% with epinephrine  Anesthetic total: 4 ml  Complexity: complex Blunt dissection to break up  loculations  Drainage: purulent  Drainage amount: moderate  Packing material: none  Patient tolerance: Patient tolerated the procedure well with no immediate complications.   7:13 PM The patient was urged to return to the Emergency Department urgently with worsening pain, swelling, expanding erythema especially if it streaks away from the affected area, fever, or if they have any other concerns. Counseled to do warm soaks several times per day.   The patient was urged to return to the Emergency Department or go to their PCP in 48 hours for wound recheck if the area is not significantly improved.  The patient verbalized understanding and stated agreement with this plan.       MDM  Abscess, some surrounding cellulitis. Will give course of Bactrim. I&D successful. No systemic sx.         Renne Crigler, [place] 09/12/12 1916

## 2012-09-12 NOTE — ED Notes (Signed)
Pt reports abscess in (L) groin area.

## 2012-11-01 ENCOUNTER — Emergency Department (HOSPITAL_COMMUNITY)
Admission: EM | Admit: 2012-11-01 | Discharge: 2012-11-01 | Disposition: A | Discharge status: Home / Self Care | Payer: Self-pay | Attending: Emergency Medicine | Admitting: Emergency Medicine

## 2012-11-01 ENCOUNTER — Encounter (HOSPITAL_COMMUNITY): Payer: Self-pay | Admitting: Adult Health

## 2012-11-01 DIAGNOSIS — D649 Anemia, unspecified: Secondary | ICD-10-CM | POA: Insufficient documentation

## 2012-11-01 DIAGNOSIS — Z3202 Encounter for pregnancy test, result negative: Secondary | ICD-10-CM | POA: Insufficient documentation

## 2012-11-01 DIAGNOSIS — N390 Urinary tract infection, site not specified: Secondary | ICD-10-CM | POA: Insufficient documentation

## 2012-11-01 DIAGNOSIS — Z862 Personal history of diseases of the blood and blood-forming organs and certain disorders involving the immune mechanism: Secondary | ICD-10-CM | POA: Insufficient documentation

## 2012-11-01 DIAGNOSIS — R300 Dysuria: Secondary | ICD-10-CM | POA: Insufficient documentation

## 2012-11-01 DIAGNOSIS — F17200 Nicotine dependence, unspecified, uncomplicated: Secondary | ICD-10-CM | POA: Insufficient documentation

## 2012-11-01 DIAGNOSIS — Z8742 Personal history of other diseases of the female genital tract: Secondary | ICD-10-CM | POA: Insufficient documentation

## 2012-11-01 DIAGNOSIS — E0590 Thyrotoxicosis, unspecified without thyrotoxic crisis or storm: Secondary | ICD-10-CM | POA: Insufficient documentation

## 2012-11-01 DIAGNOSIS — Z8639 Personal history of other endocrine, nutritional and metabolic disease: Secondary | ICD-10-CM | POA: Insufficient documentation

## 2012-11-01 LAB — URINE MICROSCOPIC-ADD ON

## 2012-11-01 LAB — URINALYSIS, ROUTINE W REFLEX MICROSCOPIC
Glucose, UA: NEGATIVE mg/dL
Protein, ur: 30 mg/dL — AB
Urobilinogen, UA: 1 mg/dL (ref 0.0–1.0)

## 2012-11-01 MED ORDER — PHENAZOPYRIDINE HCL 200 MG PO TABS: 200.0000 mg | ORAL_TABLET | Freq: Three times a day (TID) | ORAL | Status: DC

## 2012-11-01 MED ORDER — CIPROFLOXACIN HCL 500 MG PO TABS: 500.0000 mg | ORAL_TABLET | Freq: Two times a day (BID) | ORAL | Status: DC

## 2012-11-01 NOTE — ED Notes (Signed)
Pt given discharge paperwork; pt verbalized understanding of d/c and f/u; no additional questions by pt; VSS; e-signature obtained; resps e/u;

## 2012-11-01 NOTE — ED Provider Notes (Signed)
History     CSN: [id number]  Arrival date & time 11/01/12  2020   First MD Initiated Contact with Patient 11/01/12 2158      Chief Complaint  Patient presents with  . Flank Pain    (Consider location/radiation/quality/duration/timing/severity/associated sxs/prior treatment) HPI Comments: Pt presents to the ED for right flank pain and dysuria x 1 week.  Dysuria present mainly at the end of urination. Flank pain is intermittent and non-radiating.  Denies any fever, chills, sweats, nausea, vomiting, abdominal pain or vaginal discharge.  No hematuria noted.  Has tried taking OTC azo without relief.  Patient is a 22 y.o. female presenting with flank pain. The history is provided by the patient.  Flank Pain    Past Medical History  Diagnosis Date  . Hyperthyroidism   . Hypercholesteremia   . Ovarian cyst   . Anemia   . Abortion May 2013    History reviewed. No pertinent past surgical history.  Family History  Problem Relation Age of Onset  . Diabetes Other     Parent  . Cancer Other     Breast Cancer-Parent  . Hypertension Other     Parent  . Hyperlipidemia Other     Parent  . Arthritis Other     Parent    History  Substance Use Topics  . Smoking status: Current Some Day Smoker -- 0.25 packs/day    Types: Cigarettes  . Smokeless tobacco: Not on file  . Alcohol Use: 0.0 oz/week    0 Glasses of wine, 0 Shots of liquor per week    OB History   Grav Para Term Preterm Abortions TAB SAB Ect Mult Living   1               Review of Systems  Genitourinary: Positive for dysuria and flank pain.  All other systems reviewed and are negative.    Allergies  Benadryl  Home Medications   Current Outpatient Rx  Name  Route  Sig  Dispense  Refill  . diltiazem (CARDIZEM CD) 120 MG 24 hr capsule   Oral   Take 1 capsule (120 mg total) by mouth daily.   30 capsule   1   . ferrous sulfate 325 (65 FE) MG tablet   Oral   Take 325 mg by mouth every morning.         . methimazole (TAPAZOLE) 10 MG tablet   Oral   Take 2 tablets (20 mg total) by mouth 3 (three) times daily.   18 tablet   0   . Pseudoeph-Doxylamine-DM-APAP (NYQUIL PO)   Oral   Take 30 mLs by mouth every 6 (six) hours as needed (for congestion).         . Pseudoephedrine-Guaifenesin (MUCINEX D PO)   Oral   Take 30 mL/m2/day by mouth every 6 (six) hours as needed.           BP 147/77  Pulse 81  Temp(Src) 99 F (37.2 C) (Oral)  Resp 16  SpO2 98%  LMP 10/21/2011  Physical Exam  Nursing note and vitals reviewed. Constitutional: She is oriented to person, place, and time. She appears well-developed and well-nourished.  HENT:  Head: Normocephalic and atraumatic.  Eyes: Conjunctivae and EOM are normal.  Neck: Normal range of motion. Neck supple.  Cardiovascular: Normal rate, regular rhythm and normal heart sounds.   Pulmonary/Chest: Effort normal and breath sounds normal.  Abdominal: Soft. Bowel sounds are normal. There is no tenderness. There is CVA  tenderness (Right). There is no tenderness at McBurney's point and negative Murphy's sign.  Musculoskeletal: Normal range of motion.  Neurological: She is alert and oriented to person, place, and time.  Skin: Skin is warm and dry.  Psychiatric: She has a normal mood and affect.    ED Course  Procedures (including critical care time)  Labs Reviewed  URINALYSIS, ROUTINE W REFLEX MICROSCOPIC - Abnormal; Notable for the following:    Color, Urine ORANGE (*)    APPearance CLOUDY (*)    Hgb urine dipstick LARGE (*)    Protein, ur 30 (*)    Nitrite POSITIVE (*)    Leukocytes, UA LARGE (*)    All other components within normal limits  URINE CULTURE  URINE MICROSCOPIC-ADD ON  PREGNANCY, URINE   No results found.   1. Urinary tract infection       MDM   22 y.o. Female presenting to ED with R flank pain and dysuria x 1 week.  U/A nitrite + as above.  Urine culture pending.  Will be treated with Cipro 7d course and  pyridium for sx relief.  Instructed to take abx until completely gone.  Advised that pyridium will turn urine bright orange/red.  Return precautions advised.        Garlon Hatchet, PA-C 11/02/12 [phone]

## 2012-11-01 NOTE — ED Notes (Signed)
Called pt three times to bring patient to a room, pt did not come up to the desk when called.

## 2012-11-01 NOTE — ED Notes (Addendum)
Presents with right flank pain that began this am described as sharp and stabbing, worse with movement associated with hematuria that began 2 days ago before the pain. Reports frequent urination. Denies pain with urination but reports pain after finished urinating. Denies fevers.  She reports that she is on her period the started 10-29-12, she states, "the blood is in my urine, its not from my period. I watched the stream of urine and it was there"

## 2012-11-02 NOTE — ED Provider Notes (Signed)
Medical screening examination/treatment/procedure(s) were performed by non-physician practitioner and as supervising physician I was immediately available for consultation/collaboration.  Juliet Rude. Rubin Payor, MD 11/02/12 [phone]

## 2012-11-03 LAB — URINE CULTURE

## 2012-11-04 NOTE — ED Notes (Signed)
+   Urine Patient treated with Cipro-sensitive to same-chart appended per protocol MD. 

## 2013-08-17 ENCOUNTER — Encounter (HOSPITAL_COMMUNITY): Payer: Self-pay | Admitting: Emergency Medicine

## 2013-08-17 ENCOUNTER — Emergency Department (HOSPITAL_COMMUNITY)
Admission: EM | Admit: 2013-08-17 | Discharge: 2013-08-17 | Disposition: A | Discharge status: Home / Self Care | Payer: Self-pay | Attending: Emergency Medicine | Admitting: Emergency Medicine

## 2013-08-17 DIAGNOSIS — R5383 Other fatigue: Secondary | ICD-10-CM | POA: Insufficient documentation

## 2013-08-17 DIAGNOSIS — F17200 Nicotine dependence, unspecified, uncomplicated: Secondary | ICD-10-CM | POA: Insufficient documentation

## 2013-08-17 DIAGNOSIS — Z79899 Other long term (current) drug therapy: Secondary | ICD-10-CM | POA: Insufficient documentation

## 2013-08-17 DIAGNOSIS — R630 Anorexia: Secondary | ICD-10-CM | POA: Insufficient documentation

## 2013-08-17 DIAGNOSIS — I4891 Unspecified atrial fibrillation: Secondary | ICD-10-CM | POA: Insufficient documentation

## 2013-08-17 DIAGNOSIS — R5381 Other malaise: Secondary | ICD-10-CM | POA: Insufficient documentation

## 2013-08-17 DIAGNOSIS — J111 Influenza due to unidentified influenza virus with other respiratory manifestations: Secondary | ICD-10-CM | POA: Insufficient documentation

## 2013-08-17 DIAGNOSIS — R52 Pain, unspecified: Secondary | ICD-10-CM | POA: Insufficient documentation

## 2013-08-17 DIAGNOSIS — R509 Fever, unspecified: Secondary | ICD-10-CM | POA: Insufficient documentation

## 2013-08-17 DIAGNOSIS — E0590 Thyrotoxicosis, unspecified without thyrotoxic crisis or storm: Secondary | ICD-10-CM | POA: Insufficient documentation

## 2013-08-17 DIAGNOSIS — D649 Anemia, unspecified: Secondary | ICD-10-CM | POA: Insufficient documentation

## 2013-08-17 DIAGNOSIS — R51 Headache: Secondary | ICD-10-CM | POA: Insufficient documentation

## 2013-08-17 DIAGNOSIS — Z8742 Personal history of other diseases of the female genital tract: Secondary | ICD-10-CM | POA: Insufficient documentation

## 2013-08-17 NOTE — ED Notes (Signed)
Patient complaining of cough and body aches x 1 week.

## 2013-08-17 NOTE — ED Notes (Signed)
Pt seen and evaluated by EDPa for initial assessment. 

## 2013-08-17 NOTE — Discharge Instructions (Signed)

## 2013-08-17 NOTE — ED Provider Notes (Signed)
CSN: [id number]     Arrival date & time 08/17/13  1445 History   First MD Initiated Contact with Patient 08/17/13 1501     Chief Complaint  Patient presents with  . Cough  . Generalized Body Aches   (Consider location/radiation/quality/duration/timing/severity/associated sxs/prior Treatment) HPI Comments: Patient here with subjective fever, chills, headache, nasal congestion, dry cough and body aches for the past 5 days - she states no flu shot this week.  Has been taking her medication as directed, denies ear pain, sore throat, productive cough, abdominal pain, nausea or vomiting.  Patient is a 23 y.o. female presenting with flu symptoms. The history is provided by the patient. No language interpreter was used.  Influenza Presenting symptoms: cough, fatigue, fever, headache, myalgias and rhinorrhea   Presenting symptoms: no diarrhea, no nausea, no shortness of breath, no sore throat and no vomiting   Severity:  Moderate Onset quality:  Gradual Progression:  Worsening Chronicity:  New Relieved by:  Nothing Worsened by:  Nothing tried Ineffective treatments:  None tried Associated symptoms: chills, decreased appetite and nasal congestion   Associated symptoms: no decrease in physical activity, no ear pain, no mental status change, no neck stiffness and no witnessed syncope     Past Medical History  Diagnosis Date  . Hyperthyroidism   . Hypercholesteremia   . Ovarian cyst   . Anemia   . Abortion May 2013  . Atrial fibrillation    History reviewed. No pertinent past surgical history. Family History  Problem Relation Age of Onset  . Diabetes Other     Parent  . Cancer Other     Breast Cancer-Parent  . Hypertension Other     Parent  . Hyperlipidemia Other     Parent  . Arthritis Other     Parent   History  Substance Use Topics  . Smoking status: Current Some Day Smoker -- 0.25 packs/day    Types: Cigarettes  . Smokeless tobacco: Not on file  . Alcohol Use: 0.0 oz/week     0 Glasses of wine, 0 Shots of liquor per week     Comment: occasionally   OB History   Grav Para Term Preterm Abortions TAB SAB Ect Mult Living   1              Review of Systems  Constitutional: Positive for fever, chills, fatigue and decreased appetite.  HENT: Positive for congestion and rhinorrhea. Negative for ear pain and sore throat.   Respiratory: Positive for cough. Negative for shortness of breath.   Gastrointestinal: Negative for nausea, vomiting and diarrhea.  Musculoskeletal: Positive for myalgias. Negative for neck stiffness.  Neurological: Positive for headaches.  All other systems reviewed and are negative.    Allergies  Chloroxine and Benadryl  Home Medications   Current Outpatient Rx  Name  Route  Sig  Dispense  Refill  . ciprofloxacin (CIPRO) 500 MG tablet   Oral   Take 1 tablet (500 mg total) by mouth 2 (two) times daily.   14 tablet   0   . diltiazem (CARDIZEM CD) 120 MG 24 hr capsule   Oral   Take 1 capsule (120 mg total) by mouth daily.   30 capsule   1   . ferrous sulfate 325 (65 FE) MG tablet   Oral   Take 325 mg by mouth every morning.         . methimazole (TAPAZOLE) 10 MG tablet   Oral   Take 2 tablets (20  mg total) by mouth 3 (three) times daily.   18 tablet   0   . phenazopyridine (PYRIDIUM) 200 MG tablet   Oral   Take 1 tablet (200 mg total) by mouth 3 (three) times daily.   12 tablet   0   . Pseudoeph-Doxylamine-DM-APAP (NYQUIL PO)   Oral   Take 30 mLs by mouth every 6 (six) hours as needed (for congestion).         . Pseudoephedrine-Guaifenesin (MUCINEX D PO)   Oral   Take 30 mL/m2/day by mouth every 6 (six) hours as needed.          BP 171/83  Pulse 89  Temp(Src) 98.1 F (36.7 C) (Oral)  Resp 18  Ht 5\' 9"  (1.753 m)  Wt 210 lb (95.255 kg)  BMI 31.00 kg/m2  SpO2 98%  LMP 08/10/2013 Physical Exam  Nursing note and vitals reviewed. Constitutional: She is oriented to person, place, and time. She appears  well-developed and well-nourished. No distress.  HENT:  Head: Normocephalic and atraumatic.  Right Ear: External ear normal.  Left Ear: External ear normal.  Mouth/Throat: Oropharynx is clear and moist. No oropharyngeal exudate.  Boggy nasal mucosa  Eyes: Conjunctivae are normal. Pupils are equal, round, and reactive to light. No scleral icterus.  Neck: Normal range of motion. Neck supple.  Cardiovascular: Normal rate, regular rhythm and normal heart sounds.  Exam reveals no gallop and no friction rub.   No murmur heard. Pulmonary/Chest: Effort normal and breath sounds normal. No respiratory distress. She has no wheezes. She has no rales. She exhibits no tenderness.  Abdominal: Soft. Bowel sounds are normal. She exhibits no distension. There is no tenderness.  Musculoskeletal: Normal range of motion. She exhibits no edema and no tenderness.  Lymphadenopathy:    She has no cervical adenopathy.  Neurological: She is alert and oriented to person, place, and time. She exhibits normal muscle tone. Coordination normal.  Skin: Skin is warm and dry. No rash noted. No erythema. No pallor.  Psychiatric: She has a normal mood and affect. Her behavior is normal. Judgment and thought content normal.    ED Course  Procedures (including critical care time) Labs Review Labs Reviewed - No data to display Imaging Review No results found.  EKG Interpretation   None       MDM  Influenza  Patient here with symptoms c/w influenza, vital signs stable here - not a candidate for tamiflu - will continue supportive care, will write out of work several days.    Izola PriceFrances C. Marisue HumbleSanford, [place] JerseyPA-C 08/17/13 [phone]

## 2013-08-18 NOTE — ED Provider Notes (Signed)
Medical screening examination/treatment/procedure(s) were performed by non-physician practitioner and as supervising physician I was immediately available for consultation/collaboration.  EKG Interpretation   None         Adriyanna Christians L Lavita Pontius, MD 08/18/13 0720 

## 2013-08-22 ENCOUNTER — Emergency Department (HOSPITAL_BASED_OUTPATIENT_CLINIC_OR_DEPARTMENT_OTHER)
Admission: EM | Admit: 2013-08-22 | Discharge: 2013-08-22 | Disposition: A | Discharge status: Home / Self Care | Payer: Self-pay | Attending: Emergency Medicine | Admitting: Emergency Medicine

## 2013-08-22 ENCOUNTER — Encounter (HOSPITAL_BASED_OUTPATIENT_CLINIC_OR_DEPARTMENT_OTHER): Payer: Self-pay | Admitting: Emergency Medicine

## 2013-08-22 DIAGNOSIS — H0520 Unspecified exophthalmos: Secondary | ICD-10-CM | POA: Insufficient documentation

## 2013-08-22 DIAGNOSIS — J40 Bronchitis, not specified as acute or chronic: Secondary | ICD-10-CM

## 2013-08-22 DIAGNOSIS — J209 Acute bronchitis, unspecified: Secondary | ICD-10-CM | POA: Insufficient documentation

## 2013-08-22 DIAGNOSIS — F17200 Nicotine dependence, unspecified, uncomplicated: Secondary | ICD-10-CM | POA: Insufficient documentation

## 2013-08-22 DIAGNOSIS — I4891 Unspecified atrial fibrillation: Secondary | ICD-10-CM | POA: Insufficient documentation

## 2013-08-22 DIAGNOSIS — Z8639 Personal history of other endocrine, nutritional and metabolic disease: Secondary | ICD-10-CM | POA: Insufficient documentation

## 2013-08-22 DIAGNOSIS — Z792 Long term (current) use of antibiotics: Secondary | ICD-10-CM | POA: Insufficient documentation

## 2013-08-22 DIAGNOSIS — Z862 Personal history of diseases of the blood and blood-forming organs and certain disorders involving the immune mechanism: Secondary | ICD-10-CM | POA: Insufficient documentation

## 2013-08-22 DIAGNOSIS — D649 Anemia, unspecified: Secondary | ICD-10-CM | POA: Insufficient documentation

## 2013-08-22 DIAGNOSIS — Z8742 Personal history of other diseases of the female genital tract: Secondary | ICD-10-CM | POA: Insufficient documentation

## 2013-08-22 DIAGNOSIS — Z79899 Other long term (current) drug therapy: Secondary | ICD-10-CM | POA: Insufficient documentation

## 2013-08-22 MED ORDER — AZITHROMYCIN 250 MG PO TABS: 250.0000 mg | ORAL_TABLET | Freq: Every day | ORAL | Status: DC

## 2013-08-22 MED ORDER — IBUPROFEN 800 MG PO TABS
800.0000 mg | ORAL_TABLET | Freq: Once | ORAL | Status: AC
  Administered 2013-08-22: 800 mg via ORAL
  Filled 2013-08-22: qty 1

## 2013-08-22 MED ORDER — HYDROCODONE-HOMATROPINE 5-1.5 MG/5ML PO SYRP: 5.0000 mL | ORAL_SOLUTION | Freq: Four times a day (QID) | ORAL | Status: DC | PRN

## 2013-08-22 NOTE — ED Provider Notes (Signed)
Medical screening examination/treatment/procedure(s) were performed by non-physician practitioner and as supervising physician I was immediately available for consultation/collaboration.  EKG Interpretation   None         Davinity Fanara, MD 08/22/13 2304 

## 2013-08-22 NOTE — Discharge Instructions (Signed)

## 2013-08-22 NOTE — ED Provider Notes (Signed)
CSN: [id number]     Arrival date & time 08/22/13  1446 History   First MD Initiated Contact with Patient 08/22/13 1813     Chief Complaint  Patient presents with  . URI   (Consider location/radiation/quality/duration/timing/severity/associated sxs/prior Treatment) Patient is a 23 y.o. female presenting with URI. The history is provided by the patient. No language interpreter was used.  URI Presenting symptoms: congestion and cough   Severity:  Moderate Onset quality:  Sudden Duration:  1 week Timing:  Constant Progression:  Worsening Chronicity:  New Worsened by:  Nothing tried Ineffective treatments:  None tried   Past Medical History  Diagnosis Date  . Hyperthyroidism   . Hypercholesteremia   . Ovarian cyst   . Anemia   . Abortion May 2013  . Atrial fibrillation    History reviewed. No pertinent past surgical history. Family History  Problem Relation Age of Onset  . Diabetes Other     Parent  . Cancer Other     Breast Cancer-Parent  . Hypertension Other     Parent  . Hyperlipidemia Other     Parent  . Arthritis Other     Parent   History  Substance Use Topics  . Smoking status: Current Some Day Smoker -- 0.25 packs/day    Types: Cigarettes  . Smokeless tobacco: Not on file  . Alcohol Use: 0.0 oz/week    0 Glasses of wine, 0 Shots of liquor per week     Comment: occasionally   OB History   Grav Para Term Preterm Abortions TAB SAB Ect Mult Living   1              Review of Systems  HENT: Positive for congestion.   Respiratory: Positive for cough.   All other systems reviewed and are negative.    Allergies  Chloroxine and Benadryl  Home Medications   Current Outpatient Rx  Name  Route  Sig  Dispense  Refill  . azithromycin (ZITHROMAX) 250 MG tablet   Oral   Take 1 tablet (250 mg total) by mouth daily. Take first 2 tablets together, then 1 every day until finished.   6 tablet   0   . ciprofloxacin (CIPRO) 500 MG tablet   Oral   Take 1  tablet (500 mg total) by mouth 2 (two) times daily.   14 tablet   0   . diltiazem (CARDIZEM CD) 120 MG 24 hr capsule   Oral   Take 1 capsule (120 mg total) by mouth daily.   30 capsule   1   . ferrous sulfate 325 (65 FE) MG tablet   Oral   Take 325 mg by mouth every morning.         Marland Kitchen HYDROcodone-homatropine (HYDROMET) 5-1.5 MG/5ML syrup   Oral   Take 5 mLs by mouth every 6 (six) hours as needed for cough.   120 mL   0   . ibuprofen (ADVIL,MOTRIN) 800 MG tablet   Oral   Take 800 mg by mouth every 8 (eight) hours as needed.         . methimazole (TAPAZOLE) 10 MG tablet   Oral   Take 2 tablets (20 mg total) by mouth 3 (three) times daily.   18 tablet   0   . phenazopyridine (PYRIDIUM) 200 MG tablet   Oral   Take 1 tablet (200 mg total) by mouth 3 (three) times daily.   12 tablet   0   . Pseudoeph-Doxylamine-DM-APAP (  NYQUIL PO)   Oral   Take 30 mLs by mouth every 6 (six) hours as needed (for congestion).         . Pseudoephedrine-Guaifenesin (MUCINEX D PO)   Oral   Take 30 mL/m2/day by mouth every 6 (six) hours as needed.          BP 196/92  Pulse 100  Temp(Src) 98.8 F (37.1 C)  Resp 16  Wt 210 lb (95.255 kg)  SpO2 100%  LMP 08/10/2013 Physical Exam  Constitutional: She appears well-developed and well-nourished.  HENT:  Head: Normocephalic.  Right Ear: External ear normal.  Left Ear: External ear normal.  Nose: Nose normal.  Mouth/Throat: Oropharynx is clear and moist.  Eyes:  exophthalmos   Neck: Normal range of motion.  Cardiovascular: Normal rate.   Pulmonary/Chest: Effort normal.  Abdominal: Soft.  Musculoskeletal: Normal range of motion.  Neurological: She is alert.  Skin: Skin is warm.  Psychiatric: She has a normal mood and affect.    ED Course  Procedures (including critical care time) Labs Review Labs Reviewed - No data to display Imaging Review No results found.  EKG Interpretation   None       MDM   1. Bronchitis     zithromax and hydromet    Elson AreasLeslie K Arturo Sofranko, [place] JerseyPA-C 08/22/13 1828

## 2013-08-22 NOTE — ED Notes (Signed)
Cough and body aches x 1 week

## 2013-12-11 ENCOUNTER — Emergency Department (HOSPITAL_COMMUNITY)
Admission: EM | Admit: 2013-12-11 | Discharge: 2013-12-11 | Disposition: A | Discharge status: Home / Self Care | Payer: Self-pay | Attending: Emergency Medicine | Admitting: Emergency Medicine

## 2013-12-11 ENCOUNTER — Encounter (HOSPITAL_COMMUNITY): Payer: Self-pay | Admitting: Emergency Medicine

## 2013-12-11 DIAGNOSIS — I4891 Unspecified atrial fibrillation: Secondary | ICD-10-CM | POA: Insufficient documentation

## 2013-12-11 DIAGNOSIS — Z79899 Other long term (current) drug therapy: Secondary | ICD-10-CM | POA: Insufficient documentation

## 2013-12-11 DIAGNOSIS — Z8742 Personal history of other diseases of the female genital tract: Secondary | ICD-10-CM | POA: Insufficient documentation

## 2013-12-11 DIAGNOSIS — E0590 Thyrotoxicosis, unspecified without thyrotoxic crisis or storm: Secondary | ICD-10-CM | POA: Insufficient documentation

## 2013-12-11 DIAGNOSIS — K029 Dental caries, unspecified: Secondary | ICD-10-CM | POA: Insufficient documentation

## 2013-12-11 DIAGNOSIS — D649 Anemia, unspecified: Secondary | ICD-10-CM | POA: Insufficient documentation

## 2013-12-11 DIAGNOSIS — Z792 Long term (current) use of antibiotics: Secondary | ICD-10-CM | POA: Insufficient documentation

## 2013-12-11 DIAGNOSIS — F17200 Nicotine dependence, unspecified, uncomplicated: Secondary | ICD-10-CM | POA: Insufficient documentation

## 2013-12-11 DIAGNOSIS — Z791 Long term (current) use of non-steroidal anti-inflammatories (NSAID): Secondary | ICD-10-CM | POA: Insufficient documentation

## 2013-12-11 MED ORDER — PENICILLIN V POTASSIUM 250 MG PO TABS
500.0000 mg | ORAL_TABLET | Freq: Once | ORAL | Status: AC
  Administered 2013-12-11: 500 mg via ORAL
  Filled 2013-12-11: qty 2

## 2013-12-11 MED ORDER — HYDROCODONE-ACETAMINOPHEN 5-325 MG PO TABS
1.0000 | ORAL_TABLET | Freq: Once | ORAL | Status: AC
Start: 2013-12-11 — End: 2013-12-11
  Administered 2013-12-11: 1 via ORAL
  Filled 2013-12-11: qty 1

## 2013-12-11 MED ORDER — TRAMADOL HCL 50 MG PO TABS: 50.0000 mg | ORAL_TABLET | Freq: Four times a day (QID) | ORAL | Status: DC | PRN

## 2013-12-11 MED ORDER — PENICILLIN V POTASSIUM 500 MG PO TABS
500.0000 mg | ORAL_TABLET | Freq: Three times a day (TID) | ORAL | Status: DC
Start: 2013-12-11 — End: 2014-03-11

## 2013-12-11 NOTE — ED Notes (Addendum)
Pt reports she broke her bottom left tooth a few years ago however pt reports the tooth chipped last week and keeps chipping all week, pt reports her pain is really bad. Pt is A&O X4.

## 2013-12-11 NOTE — ED Provider Notes (Addendum)
CSN: [id number]     Arrival date & time 12/11/13  0251 History   First MD Initiated Contact with Patient 12/11/13 0258     Chief Complaint  Patient presents with  . Dental Pain     (Consider location/radiation/quality/duration/timing/severity/associated sxs/prior Treatment) Patient is a 23 y.o. female presenting with tooth pain. The history is provided by the patient.  Dental Pain Location:  Lower Lower teeth location:  18/LL 2nd molar Quality:  Dull Severity:  Severe Onset quality:  Gradual Timing:  Constant Progression:  Unchanged Chronicity:  Recurrent Context: poor dentition   Relieved by:  Nothing Worsened by:  Nothing tried Associated symptoms: no fever   Risk factors: no diabetes     Past Medical History  Diagnosis Date  . Hyperthyroidism   . Hypercholesteremia   . Ovarian cyst   . Anemia   . Abortion May 2013  . Atrial fibrillation    History reviewed. No pertinent past surgical history. Family History  Problem Relation Age of Onset  . Diabetes Other     Parent  . Cancer Other     Breast Cancer-Parent  . Hypertension Other     Parent  . Hyperlipidemia Other     Parent  . Arthritis Other     Parent   History  Substance Use Topics  . Smoking status: Current Some Day Smoker -- 0.25 packs/day    Types: Cigarettes  . Smokeless tobacco: Not on file  . Alcohol Use: 0.0 oz/week    0 Glasses of wine, 0 Shots of liquor per week     Comment: occasionally   OB History   Grav Para Term Preterm Abortions TAB SAB Ect Mult Living   1              Review of Systems  Constitutional: Negative for fever.  All other systems reviewed and are negative.     Allergies  Chloroxine and Benadryl  Home Medications   Prior to Admission medications   Medication Sig Start Date End Date Taking? Authorizing Provider  azithromycin (ZITHROMAX) 250 MG tablet Take 1 tablet (250 mg total) by mouth daily. Take first 2 tablets together, then 1 every day until finished.  08/22/13   Elson AreasLeslie K Sofia, PA-C  ciprofloxacin (CIPRO) 500 MG tablet Take 1 tablet (500 mg total) by mouth 2 (two) times daily. 11/01/12   Garlon HatchetLisa M Sanders, PA-C  diltiazem (CARDIZEM CD) 120 MG 24 hr capsule Take 1 capsule (120 mg total) by mouth daily. 05/22/12   Ivy de Lawson RadarLa Cruz, DO  ferrous sulfate 325 (65 FE) MG tablet Take 325 mg by mouth every morning.    Historical Provider, MD  HYDROcodone-homatropine (HYDROMET) 5-1.5 MG/5ML syrup Take 5 mLs by mouth every 6 (six) hours as needed for cough. 08/22/13   Elson AreasLeslie K Sofia, PA-C  ibuprofen (ADVIL,MOTRIN) 800 MG tablet Take 800 mg by mouth every 8 (eight) hours as needed.    Historical Provider, MD  methimazole (TAPAZOLE) 10 MG tablet Take 2 tablets (20 mg total) by mouth 3 (three) times daily. 05/22/12   Ivy de Lawson RadarLa Cruz, DO  penicillin v potassium (VEETID) 500 MG tablet Take 1 tablet (500 mg total) by mouth 3 (three) times daily. 12/11/13   Rosco Harriott K [place]-Rasch, MD  phenazopyridine (PYRIDIUM) 200 MG tablet Take 1 tablet (200 mg total) by mouth 3 (three) times daily. 11/01/12   Garlon HatchetLisa M Sanders, PA-C  Pseudoeph-Doxylamine-DM-APAP (NYQUIL PO) Take 30 mLs by mouth every 6 (six) hours as needed (for  congestion).    Historical Provider, MD  Pseudoephedrine-Guaifenesin [facility](MUCINEX D PO) Take 30 mL/m2/day by mouth every 6 (six) hours as needed.    Historical Provider, MD  traMADol (ULTRAM) 50 MG tablet Take 1 tablet (50 mg total) by mouth every 6 (six) hours as needed. 12/11/13   Sanjuana Mruk K Crosby [place]-Rasch, MD   BP 158/85  Pulse 70  Temp(Src) 98.4 F (36.9 C) (Oral)  Resp 18  SpO2 100% Physical Exam  Constitutional: She is oriented to person, place, and time. She appears well-developed and well-nourished. No distress.  HENT:  Head: Normocephalic.  Mouth/Throat: Oropharynx is clear and moist. No trismus in the jaw. Dental caries present. No dental abscesses or uvula swelling.    Eyes: Conjunctivae are normal. Pupils are equal, round, and reactive to light.  exopthalmos    Neck: Normal range of motion. Neck supple.  Cardiovascular: Normal rate, regular rhythm and intact distal pulses.   Pulmonary/Chest: Effort normal and breath sounds normal. She has no wheezes. She has no rales.  Abdominal: Soft. Bowel sounds are normal. There is no tenderness. There is no rebound and no guarding.  Musculoskeletal: Normal range of motion.  Neurological: She is alert and oriented to person, place, and time.  Skin: Skin is warm and dry.  Psychiatric: She has a normal mood and affect.    ED Course  Procedures (including critical care time) Labs Review Labs Reviewed - No data to display  Imaging Review No results found.   EKG Interpretation None      MDM   Final diagnoses:  Dental caries    Take all antibiotics and follow up with dentistry for ongoing care.  Condoms while on antibiotics    Jaquavian Firkus K Milah Recht-Rasch, MD 12/11/13 0305  Nachmen Mansel K Cella Cappello-Rasch, MD 12/21/13 [id number]

## 2013-12-11 NOTE — Discharge Instructions (Signed)
Dental Care and Dentist Visits Dental care supports good overall health. Regular dental visits can also help you avoid dental pain, bleeding, infection, and other more serious health problems in the future. It is important to keep the mouth healthy because diseases in the teeth, gums, and other oral tissues can spread to other areas of the body. Some problems, such as diabetes, heart disease, and pre-term labor have been associated with poor oral health.  See your dentist every 6 months. If you experience emergency problems such as a toothache or broken tooth, go to the dentist right away. If you see your dentist regularly, you may catch problems early. It is easier to be treated for problems in the early stages.  WHAT TO EXPECT AT A DENTIST VISIT  Your dentist will look for many common oral health problems and recommend proper treatment. At your regular dental visit, you can expect:  Gentle cleaning of the teeth and gums. This includes scraping and polishing. This helps to remove the sticky substance around the teeth and gums (plaque). Plaque forms in the mouth shortly after eating. Over time, plaque hardens on the teeth as tartar. If tartar is not removed regularly, it can cause problems. Cleaning also helps remove stains.  Periodic X-rays. These pictures of the teeth and supporting bone will help your dentist assess the health of your teeth.  Periodic fluoride treatments. Fluoride is a natural mineral shown to help strengthen teeth. Fluoride treatmentinvolves applying a fluoride gel or varnish to the teeth. It is most commonly done in children.  Examination of the mouth, tongue, jaws, teeth, and gums to look for any oral health problems, such as:  Cavities (dental caries). This is decay on the tooth caused by plaque, sugar, and acid in the mouth. It is best to catch a cavity when it is small.  Inflammation of the gums caused by plaque buildup (gingivitis).  Problems with the mouth or malformed  or misaligned teeth.  Oral cancer or other diseases of the soft tissues or jaws. KEEP YOUR TEETH AND GUMS HEALTHY For healthy teeth and gums, follow these general guidelines as well as your dentist's specific advice:  Have your teeth professionally cleaned at the dentist every 6 months.  Brush twice daily with a fluoride toothpaste.  Floss your teeth daily.  Ask your dentist if you need fluoride supplements, treatments, or fluoride toothpaste.  Eat a healthy diet. Reduce foods and drinks with added sugar.  Avoid smoking. TREATMENT FOR ORAL HEALTH PROBLEMS If you have oral health problems, treatment varies depending on the conditions present in your teeth and gums.  Your caregiver will most likely recommend good oral hygiene at each visit.  For cavities, gingivitis, or other oral health disease, your caregiver will perform a procedure to treat the problem. This is typically done at a separate appointment. Sometimes your caregiver will refer you to another dental specialist for specific tooth problems or for surgery. SEEK IMMEDIATE DENTAL CARE IF:  You have pain, bleeding, or soreness in the gum, tooth, jaw, or mouth area.  A permanent tooth becomes loose or separated from the gum socket.  You experience a blow or injury to the mouth or jaw area. Document Released: 04/15/2011 Document Revised: 10/26/2011 Document Reviewed: 04/15/2011 ExitCare Patient Information 2014 [employer].  

## 2014-03-11 ENCOUNTER — Emergency Department (HOSPITAL_COMMUNITY)
Admission: EM | Admit: 2014-03-11 | Discharge: 2014-03-12 | Disposition: A | Discharge status: Home / Self Care | Payer: Self-pay | Attending: Emergency Medicine | Admitting: Emergency Medicine

## 2014-03-11 DIAGNOSIS — Z8639 Personal history of other endocrine, nutritional and metabolic disease: Secondary | ICD-10-CM | POA: Insufficient documentation

## 2014-03-11 DIAGNOSIS — Z862 Personal history of diseases of the blood and blood-forming organs and certain disorders involving the immune mechanism: Secondary | ICD-10-CM | POA: Insufficient documentation

## 2014-03-11 DIAGNOSIS — N730 Acute parametritis and pelvic cellulitis: Secondary | ICD-10-CM | POA: Insufficient documentation

## 2014-03-11 DIAGNOSIS — R109 Unspecified abdominal pain: Secondary | ICD-10-CM | POA: Insufficient documentation

## 2014-03-11 DIAGNOSIS — A5901 Trichomonal vulvovaginitis: Secondary | ICD-10-CM | POA: Insufficient documentation

## 2014-03-11 DIAGNOSIS — Z3202 Encounter for pregnancy test, result negative: Secondary | ICD-10-CM | POA: Insufficient documentation

## 2014-03-11 DIAGNOSIS — R3919 Other difficulties with micturition: Secondary | ICD-10-CM | POA: Insufficient documentation

## 2014-03-11 DIAGNOSIS — Z8679 Personal history of other diseases of the circulatory system: Secondary | ICD-10-CM | POA: Insufficient documentation

## 2014-03-11 DIAGNOSIS — N898 Other specified noninflammatory disorders of vagina: Secondary | ICD-10-CM | POA: Insufficient documentation

## 2014-03-11 DIAGNOSIS — Z79899 Other long term (current) drug therapy: Secondary | ICD-10-CM | POA: Insufficient documentation

## 2014-03-11 DIAGNOSIS — F17200 Nicotine dependence, unspecified, uncomplicated: Secondary | ICD-10-CM | POA: Insufficient documentation

## 2014-03-11 LAB — WET PREP, GENITAL: Yeast Wet Prep HPF POC: NONE SEEN

## 2014-03-11 LAB — URINALYSIS, ROUTINE W REFLEX MICROSCOPIC
Bilirubin Urine: NEGATIVE
GLUCOSE, UA: NEGATIVE mg/dL
HGB URINE DIPSTICK: NEGATIVE
KETONES UR: NEGATIVE mg/dL
Leukocytes, UA: NEGATIVE
Nitrite: NEGATIVE
PH: 6 (ref 5.0–8.0)
PROTEIN: NEGATIVE mg/dL
Specific Gravity, Urine: 1.017 (ref 1.005–1.030)
Urobilinogen, UA: 1 mg/dL (ref 0.0–1.0)

## 2014-03-11 LAB — POC URINE PREG, ED: PREG TEST UR: NEGATIVE

## 2014-03-11 MED ORDER — CEFTRIAXONE SODIUM 250 MG IJ SOLR
250.0000 mg | Freq: Once | INTRAMUSCULAR | Status: AC
  Administered 2014-03-12: 250 mg via INTRAMUSCULAR
  Filled 2014-03-11: qty 250

## 2014-03-11 MED ORDER — METRONIDAZOLE 500 MG PO TABS
2000.0000 mg | ORAL_TABLET | Freq: Once | ORAL | Status: AC
  Administered 2014-03-12: 2000 mg via ORAL
  Filled 2014-03-11: qty 4

## 2014-03-11 MED ORDER — AZITHROMYCIN 1 G PO PACK
1.0000 g | PACK | Freq: Once | ORAL | Status: AC
  Administered 2014-03-12: 1 g via ORAL
  Filled 2014-03-11: qty 1

## 2014-03-11 MED ORDER — KETOROLAC TROMETHAMINE 60 MG/2ML IM SOLN
60.0000 mg | Freq: Once | INTRAMUSCULAR | Status: AC
  Administered 2014-03-12: 60 mg via INTRAMUSCULAR
  Filled 2014-03-11: qty 2

## 2014-03-11 NOTE — ED Notes (Signed)
Patient presents with c/o lower abd discomfort.  States it is worse on the right than the left.  Denies discharge or difficulty urinating.

## 2014-03-11 NOTE — ED Notes (Signed)
Presents with lower right sided abdominal pain began yesterday. Period finished yesterday. Denies vaginal discharge. Denies nausea, vomiting and diarrhea. Hx of an ovarian cyst. Pain is worse when straining to use restroom. Nothing makes pain better. Pain is described as sharp and constant. Pain does not radiate.

## 2014-03-11 NOTE — ED Provider Notes (Signed)
CSN: [id number]     Arrival date & time 03/11/14  2115 History   First MD Initiated Contact with Patient 03/11/14 2319     Chief Complaint  Patient presents with  . Abdominal Pain  . Headache     (Consider location/radiation/quality/duration/timing/severity/associated sxs/prior Treatment) Patient is a 23 y.o. female presenting with abdominal pain. The history is provided by the patient.  Abdominal Pain Pain location:  Suprapubic Pain quality: sharp   Pain radiates to:  Does not radiate Pain severity:  Severe Onset quality:  Sudden Duration:  1 day Timing:  Constant Progression:  Unchanged Chronicity:  New Context: not alcohol use and not trauma   Relieved by:  Nothing Worsened by:  Urination Ineffective treatments:  None tried Associated symptoms: no anorexia   Risk factors: not pregnant   Gets frontal HA when the pain gets bad  Past Medical History  Diagnosis Date  . Hyperthyroidism   . Hypercholesteremia   . Ovarian cyst   . Anemia   . Abortion May 2013  . Atrial fibrillation    No past surgical history on file. Family History  Problem Relation Age of Onset  . Diabetes Other     Parent  . Cancer Other     Breast Cancer-Parent  . Hypertension Other     Parent  . Hyperlipidemia Other     Parent  . Arthritis Other     Parent   History  Substance Use Topics  . Smoking status: Current Some Day Smoker -- 0.25 packs/day    Types: Cigarettes  . Smokeless tobacco: Not on file  . Alcohol Use: 0.0 oz/week    0 Glasses of wine, 0 Shots of liquor per week     Comment: occasionally   OB History   Grav Para Term Preterm Abortions TAB SAB Ect Mult Living   1              Review of Systems  Gastrointestinal: Positive for abdominal pain. Negative for anorexia.  Genitourinary: Positive for frequency and difficulty urinating.  All other systems reviewed and are negative.     Allergies  Chloroxine and Benadryl  Home Medications   Prior to Admission  medications   Medication Sig Start Date End Date Taking? Authorizing Provider  Biotin 1000 MCG tablet Take 1,000 mcg by mouth daily.   Yes Historical Provider, MD  ibuprofen (ADVIL,MOTRIN) 800 MG tablet Take 800 mg by mouth every 8 (eight) hours as needed for moderate pain.    Yes Historical Provider, MD   BP 164/91  Pulse 102  Temp(Src) 99.5 F (37.5 C) (Oral)  Resp 18  SpO2 100%  LMP 03/05/2014 Physical Exam  Constitutional: She is oriented to person, place, and time. She appears well-developed and well-nourished. No distress.  HENT:  Head: Normocephalic and atraumatic.  Mouth/Throat: Oropharynx is clear and moist. No oropharyngeal exudate.  Eyes: EOM are normal. Pupils are equal, round, and reactive to light.  Neck: Normal range of motion. Neck supple.  Cardiovascular: Normal rate, regular rhythm and intact distal pulses.   Pulmonary/Chest: Effort normal and breath sounds normal. She has no wheezes. She has no rales.  Abdominal: Soft. Bowel sounds are normal. There is no tenderness. There is no rebound and no guarding.  Genitourinary: Vaginal discharge found.  Greenish frothy yellow discharge chaperone present + CMT and adnexal tenderness  Musculoskeletal: Normal range of motion.  Neurological: She is alert and oriented to person, place, and time.  Skin: Skin is warm and dry.  Psychiatric: She has a normal mood and affect.    ED Course  Procedures (including critical care time) Labs Review Labs Reviewed  WET PREP, GENITAL  GC/CHLAMYDIA PROBE AMP  URINALYSIS, ROUTINE W REFLEX MICROSCOPIC  POC URINE PREG, ED    Imaging Review No results found.   EKG Interpretation None      MDM   Final diagnoses:  None    Will treat for PID.  [facility] [place] 7 days for recheck.  No sexual activity of any kind until 7 days after all partners treated use barrier protection always.      Moksh Loomer Smitty Cords, MD 03/12/14 [phone]

## 2014-03-12 ENCOUNTER — Encounter (HOSPITAL_COMMUNITY): Payer: Self-pay | Admitting: Emergency Medicine

## 2014-03-12 MED ORDER — TRAMADOL HCL 50 MG PO TABS: 50.0000 mg | ORAL_TABLET | Freq: Four times a day (QID) | ORAL | Status: DC | PRN

## 2014-03-12 MED ORDER — LIDOCAINE HCL (PF) 1 % IJ SOLN
5.0000 mL | Freq: Once | INTRAMUSCULAR | Status: AC
  Administered 2014-03-12: 2 mL
  Filled 2014-03-12: qty 5

## 2014-03-12 MED ORDER — DOXYCYCLINE HYCLATE 100 MG PO CAPS: 100.0000 mg | ORAL_CAPSULE | Freq: Two times a day (BID) | ORAL | Status: DC

## 2014-03-12 NOTE — Discharge Instructions (Signed)
Pelvic Inflammatory Disease °Pelvic inflammatory disease (PID) is an infection in some or all of the female organs. PID can be in the uterus, ovaries, fallopian tubes, or the surrounding tissues inside the lower belly area (pelvis). °HOME CARE  °· If given, take your antibiotic medicine as told. Finish them even if you start to feel better. °· Only take medicine as told by your doctor. °· Do not have sex (intercourse) until treatment is done or as told by your doctor. °· Tell your sex partner if you have PID. Your partner may need to be treated. °· Keep all doctor visits. °GET HELP RIGHT AWAY IF:  °· You have a fever. °· You have more belly (abdominal) or lower belly pain. °· You have chills. °· You have pain when you pee (urinate). °· You are not better after 72 hours. °· You have more fluid (discharge) coming from your vagina or fluid that is not normal. °· You need pain medicine from your doctor. °· You throw up (vomit). °· You cannot take your medicines. °· Your partner has a sexually transmitted disease (STD). °MAKE SURE YOU:  °· Understand these instructions. °· Will watch your condition. °· Will get help right away if you are not doing well or get worse. °Document Released: 10/30/2008 Document Revised: 11/28/2012 Document Reviewed: 07/30/2011 °ExitCare® Patient Information ©2015 [employer]. This information is not intended to replace advice given to you by your health care provider. Make sure you discuss any questions you have with your health care provider. ° °

## 2014-03-13 LAB — GC/CHLAMYDIA PROBE AMP
CT Probe RNA: NEGATIVE
GC Probe RNA: NEGATIVE

## 2014-05-26 ENCOUNTER — Emergency Department (HOSPITAL_COMMUNITY): Payer: Self-pay

## 2014-05-26 ENCOUNTER — Encounter (HOSPITAL_COMMUNITY): Payer: Self-pay | Admitting: Emergency Medicine

## 2014-05-26 ENCOUNTER — Inpatient Hospital Stay (HOSPITAL_COMMUNITY)
Admission: EM | Admit: 2014-05-26 | Discharge: 2014-05-31 | DRG: 394 | Disposition: A | Discharge status: Home / Self Care | Payer: Self-pay | Attending: General Surgery | Admitting: General Surgery

## 2014-05-26 DIAGNOSIS — R791 Abnormal coagulation profile: Secondary | ICD-10-CM | POA: Diagnosis present

## 2014-05-26 DIAGNOSIS — E780 Pure hypercholesterolemia: Secondary | ICD-10-CM | POA: Diagnosis present

## 2014-05-26 DIAGNOSIS — D696 Thrombocytopenia, unspecified: Secondary | ICD-10-CM

## 2014-05-26 DIAGNOSIS — Z79899 Other long term (current) drug therapy: Secondary | ICD-10-CM

## 2014-05-26 DIAGNOSIS — D693 Immune thrombocytopenic purpura: Secondary | ICD-10-CM | POA: Diagnosis present

## 2014-05-26 DIAGNOSIS — E0500 Thyrotoxicosis with diffuse goiter without thyrotoxic crisis or storm: Secondary | ICD-10-CM | POA: Diagnosis present

## 2014-05-26 DIAGNOSIS — K3589 Other acute appendicitis without perforation or gangrene: Secondary | ICD-10-CM

## 2014-05-26 DIAGNOSIS — F17210 Nicotine dependence, cigarettes, uncomplicated: Secondary | ICD-10-CM | POA: Diagnosis present

## 2014-05-26 DIAGNOSIS — D509 Iron deficiency anemia, unspecified: Secondary | ICD-10-CM | POA: Diagnosis present

## 2014-05-26 DIAGNOSIS — K3580 Unspecified acute appendicitis: Principal | ICD-10-CM | POA: Diagnosis present

## 2014-05-26 LAB — COMPREHENSIVE METABOLIC PANEL
ALT: 23 U/L (ref 0–35)
ANION GAP: 14 (ref 5–15)
AST: 17 U/L (ref 0–37)
Albumin: 4 g/dL (ref 3.5–5.2)
Alkaline Phosphatase: 84 U/L (ref 39–117)
BUN: 6 mg/dL (ref 6–23)
CALCIUM: 9.3 mg/dL (ref 8.4–10.5)
CO2: 20 meq/L (ref 19–32)
CREATININE: 0.36 mg/dL — AB (ref 0.50–1.10)
Chloride: 102 mEq/L (ref 96–112)
GLUCOSE: 117 mg/dL — AB (ref 70–99)
Potassium: 3.7 mEq/L (ref 3.7–5.3)
SODIUM: 136 meq/L — AB (ref 137–147)
TOTAL PROTEIN: 8.1 g/dL (ref 6.0–8.3)
Total Bilirubin: 1 mg/dL (ref 0.3–1.2)

## 2014-05-26 LAB — CBC WITH DIFFERENTIAL/PLATELET
BASOS ABS: 0 10*3/uL (ref 0.0–0.1)
BASOS ABS: 0 10*3/uL (ref 0.0–0.1)
Basophils Relative: 0 % (ref 0–1)
Basophils Relative: 0 % (ref 0–1)
EOS ABS: 0 10*3/uL (ref 0.0–0.7)
Eosinophils Absolute: 0 10*3/uL (ref 0.0–0.7)
Eosinophils Relative: 0 % (ref 0–5)
Eosinophils Relative: 0 % (ref 0–5)
HCT: 33.1 % — ABNORMAL LOW (ref 36.0–46.0)
HEMATOCRIT: 34 % — AB (ref 36.0–46.0)
Hemoglobin: 11.2 g/dL — ABNORMAL LOW (ref 12.0–15.0)
Hemoglobin: 10.8 g/dL — ABNORMAL LOW (ref 12.0–15.0)
LYMPHS PCT: 6 % — AB (ref 12–46)
LYMPHS PCT: 7 % — AB (ref 12–46)
Lymphs Abs: 1.2 10*3/uL (ref 0.7–4.0)
Lymphs Abs: 1.2 10*3/uL (ref 0.7–4.0)
MCH: 24.1 pg — ABNORMAL LOW (ref 26.0–34.0)
MCH: 24.5 pg — ABNORMAL LOW (ref 26.0–34.0)
MCHC: 32.9 g/dL (ref 30.0–36.0)
MCHC: 32.6 g/dL (ref 30.0–36.0)
MCV: 73.1 fL — ABNORMAL LOW (ref 78.0–100.0)
MCV: 75.2 fL — ABNORMAL LOW (ref 78.0–100.0)
MONOS PCT: 8 % (ref 3–12)
Monocytes Absolute: 1.6 10*3/uL — ABNORMAL HIGH (ref 0.1–1.0)
Monocytes Absolute: 1.4 10*3/uL — ABNORMAL HIGH (ref 0.1–1.0)
Monocytes Relative: 8 % (ref 3–12)
NEUTROS ABS: 16.9 10*3/uL — AB (ref 1.7–7.7)
NEUTROS PCT: 86 % — AB (ref 43–77)
NEUTROS PCT: 85 % — AB (ref 43–77)
Neutro Abs: 14.5 10*3/uL — ABNORMAL HIGH (ref 1.7–7.7)
PLATELETS: 12 10*3/uL — AB (ref 150–400)
Platelets: 31 10*3/uL — ABNORMAL LOW (ref 150–400)
RBC: 4.65 MIL/uL (ref 3.87–5.11)
RBC: 4.4 MIL/uL (ref 3.87–5.11)
RDW: 15.2 % (ref 11.5–15.5)
RDW: 15.6 % — AB (ref 11.5–15.5)
SMEAR REVIEW: DECREASED
WBC: 19.7 10*3/uL — AB (ref 4.0–10.5)
WBC: 17.1 10*3/uL — ABNORMAL HIGH (ref 4.0–10.5)

## 2014-05-26 LAB — TYPE AND SCREEN
ABO/RH(D): O POS
Antibody Screen: NEGATIVE

## 2014-05-26 LAB — URINALYSIS, ROUTINE W REFLEX MICROSCOPIC
Bilirubin Urine: NEGATIVE
GLUCOSE, UA: NEGATIVE mg/dL
Hgb urine dipstick: NEGATIVE
KETONES UR: 15 mg/dL — AB
NITRITE: NEGATIVE
PROTEIN: NEGATIVE mg/dL
Specific Gravity, Urine: 1.018 (ref 1.005–1.030)
UROBILINOGEN UA: 1 mg/dL (ref 0.0–1.0)
pH: 6 (ref 5.0–8.0)

## 2014-05-26 LAB — WET PREP, GENITAL
Trich, Wet Prep: NONE SEEN
Yeast Wet Prep HPF POC: NONE SEEN

## 2014-05-26 LAB — URINE MICROSCOPIC-ADD ON

## 2014-05-26 LAB — POC URINE PREG, ED: PREG TEST UR: NEGATIVE

## 2014-05-26 MED ORDER — IOHEXOL 300 MG/ML  SOLN
100.0000 mL | Freq: Once | INTRAMUSCULAR | Status: AC | PRN
  Administered 2014-05-26: 100 mL via INTRAVENOUS

## 2014-05-26 MED ORDER — MORPHINE SULFATE 4 MG/ML IJ SOLN
6.0000 mg | Freq: Once | INTRAMUSCULAR | Status: AC
  Administered 2014-05-26: 6 mg via INTRAVENOUS
  Filled 2014-05-26: qty 2

## 2014-05-26 MED ORDER — IOHEXOL 300 MG/ML  SOLN
25.0000 mL | Freq: Once | INTRAMUSCULAR | Status: AC | PRN
  Administered 2014-05-26: 25 mL via ORAL

## 2014-05-26 NOTE — ED Notes (Signed)
Pt reports onset this am of lower abd pain. Having nausea but no vomiting or diarrhea. Denies any urinary or vaginal symptoms.

## 2014-05-26 NOTE — H&P (Signed)
Kellie Shaffer is an 23 y.o. female.   Chief Complaint: RLQ pain HPI: 23 yo female presents with less than 24 hours of lower abdominal pain that has now localized to her right lower quadrant.  She has had some nausea, but is still quite hungry.  She presented to the ED for evaluation and was noted to have appendicitis on CT scan.  However, her platelet count is quite low, confirmed by repeat CBC.  She does report frequent easy bruising and chronic anemia.    Past Medical History  Diagnosis Date  . Hyperthyroidism   . Hypercholesteremia   . Ovarian cyst   . Anemia   . Abortion May 2013  . Atrial fibrillation     History reviewed. No pertinent past surgical history.  Family History  Problem Relation Age of Onset  . Diabetes Other     Parent  . Cancer Other     Breast Cancer-Parent  . Hypertension Other     Parent  . Hyperlipidemia Other     Parent  . Arthritis Other     Parent   Social History:  reports that she has been smoking Cigarettes.  She has been smoking about 0.25 packs per day. She does not have any smokeless tobacco history on file. She reports that she drinks alcohol. She reports that she does not use illicit drugs.  Allergies:  Allergies  Allergen Reactions  . Benadryl [Diphenhydramine Hcl] Anaphylaxis and Hives  . Chloroxine Hives    Prior to Admission medications   Medication Sig Start Date End Date Taking? Authorizing Provider  traMADol (ULTRAM) 50 MG tablet Take 50 mg by mouth every 6 (six) hours as needed for moderate pain.   Yes Historical Provider, MD     Results for orders placed during the hospital encounter of 05/26/14 (from the past 48 hour(s))  COMPREHENSIVE METABOLIC PANEL     Status: Abnormal   Collection Time    05/26/14  2:00 PM      Result Value Ref Range   Sodium 136 (*) 137 - 147 mEq/L   Potassium 3.7  3.7 - 5.3 mEq/L   Chloride 102  96 - 112 mEq/L   CO2 20  19 - 32 mEq/L   Glucose, Bld 117 (*) 70 - 99 mg/dL   BUN 6  6 - 23  mg/dL   Creatinine, Ser 0.36 (*) 0.50 - 1.10 mg/dL   Calcium 9.3  8.4 - 10.5 mg/dL   Total Protein 8.1  6.0 - 8.3 g/dL   Albumin 4.0  3.5 - 5.2 g/dL   AST 17  0 - 37 U/L   ALT 23  0 - 35 U/L   Alkaline Phosphatase 84  39 - 117 U/L   Total Bilirubin 1.0  0.3 - 1.2 mg/dL   GFR calc non Af Amer >90  >90 mL/min   GFR calc Af Amer >90  >90 mL/min   Comment: (NOTE)     The eGFR has been calculated using the CKD EPI equation.     This calculation has not been validated in all clinical situations.     eGFR's persistently <90 mL/min signify possible Chronic Kidney     Disease.   Anion gap 14  5 - 15  URINALYSIS, ROUTINE W REFLEX MICROSCOPIC     Status: Abnormal   Collection Time    05/26/14  5:40 PM      Result Value Ref Range   Color, Urine YELLOW  YELLOW   APPearance  CLEAR  CLEAR   Specific Gravity, Urine 1.018  1.005 - 1.030   pH 6.0  5.0 - 8.0   Glucose, UA NEGATIVE  NEGATIVE mg/dL   Hgb urine dipstick NEGATIVE  NEGATIVE   Bilirubin Urine NEGATIVE  NEGATIVE   Ketones, ur 15 (*) NEGATIVE mg/dL   Protein, ur NEGATIVE  NEGATIVE mg/dL   Urobilinogen, UA 1.0  0.0 - 1.0 mg/dL   Nitrite NEGATIVE  NEGATIVE   Leukocytes, UA TRACE (*) NEGATIVE  URINE MICROSCOPIC-ADD ON     Status: None   Collection Time    05/26/14  5:40 PM      Result Value Ref Range   Squamous Epithelial / LPF RARE  RARE   WBC, UA 0-2  <3 WBC/hpf   Urine-Other MUCOUS PRESENT    POC URINE PREG, ED     Status: None   Collection Time    05/26/14  5:56 PM      Result Value Ref Range   Preg Test, Ur NEGATIVE  NEGATIVE   Comment:            THE SENSITIVITY OF THIS     METHODOLOGY IS >24 mIU/mL  WET PREP, GENITAL     Status: Abnormal   Collection Time    05/26/14  6:03 PM      Result Value Ref Range   Yeast Wet Prep HPF POC NONE SEEN  NONE SEEN   Trich, Wet Prep NONE SEEN  NONE SEEN   Clue Cells Wet Prep HPF POC FEW (*) NONE SEEN   WBC, Wet Prep HPF POC MODERATE (*) NONE SEEN  CBC WITH DIFFERENTIAL     Status:  Abnormal   Collection Time    05/26/14  6:18 PM      Result Value Ref Range   WBC 19.7 (*) 4.0 - 10.5 K/uL   Comment: REPEATED TO VERIFY     WHITE COUNT CONFIRMED ON SMEAR   RBC 4.65  3.87 - 5.11 MIL/uL   Hemoglobin 11.2 (*) 12.0 - 15.0 g/dL   HCT 34.0 (*) 36.0 - 46.0 %   MCV 73.1 (*) 78.0 - 100.0 fL   MCH 24.1 (*) 26.0 - 34.0 pg   MCHC 32.9  30.0 - 36.0 g/dL   RDW 15.2  11.5 - 15.5 %   Platelets 12 (*) 150 - 400 K/uL   Comment: SPECIMEN CHECKED FOR CLOTS     REPEATED TO VERIFY     PLATELET COUNT CONFIRMED BY SMEAR     CRITICAL RESULT CALLED TO, READ BACK BY AND VERIFIED WITH:     Margarita Sermons [phone] [place] M   Neutrophils Relative % 86 (*) 43 - 77 %   Lymphocytes Relative 6 (*) 12 - 46 %   Monocytes Relative 8  3 - 12 %   Eosinophils Relative 0  0 - 5 %   Basophils Relative 0  0 - 1 %   Neutro Abs 16.9 (*) 1.7 - 7.7 K/uL   Lymphs Abs 1.2  0.7 - 4.0 K/uL   Monocytes Absolute 1.6 (*) 0.1 - 1.0 K/uL   Eosinophils Absolute 0.0  0.0 - 0.7 K/uL   Basophils Absolute 0.0  0.0 - 0.1 K/uL   WBC Morphology TOXIC GRANULATION     Smear Review LARGE PLATELETS PRESENT    TYPE AND SCREEN     Status: None   Collection Time    05/26/14  8:57 PM      Result Value Ref Range  ABO/RH(D) O POS     Antibody Screen NEG     Sample Expiration 05/29/2014    CBC WITH DIFFERENTIAL     Status: Abnormal   Collection Time    05/26/14  9:07 PM      Result Value Ref Range   WBC 17.1 (*) 4.0 - 10.5 K/uL   Comment: WHITE COUNT CONFIRMED ON SMEAR   RBC 4.40  3.87 - 5.11 MIL/uL   Hemoglobin 10.8 (*) 12.0 - 15.0 g/dL   HCT 33.1 (*) 36.0 - 46.0 %   MCV 75.2 (*) 78.0 - 100.0 fL   MCH 24.5 (*) 26.0 - 34.0 pg   MCHC 32.6  30.0 - 36.0 g/dL   RDW 15.6 (*) 11.5 - 15.5 %   Platelets 31 (*) 150 - 400 K/uL   Comment: PLATELET COUNT CONFIRMED BY SMEAR     SPECIMEN CHECKED FOR CLOTS     REPEATED TO VERIFY     DELTA CHECK NOTED   Neutrophils Relative % 85 (*) 43 - 77 %   Lymphocytes Relative 7 (*) 12 -  46 %   Monocytes Relative 8  3 - 12 %   Eosinophils Relative 0  0 - 5 %   Basophils Relative 0  0 - 1 %   Neutro Abs 14.5 (*) 1.7 - 7.7 K/uL   Lymphs Abs 1.2  0.7 - 4.0 K/uL   Monocytes Absolute 1.4 (*) 0.1 - 1.0 K/uL   Eosinophils Absolute 0.0  0.0 - 0.7 K/uL   Basophils Absolute 0.0  0.0 - 0.1 K/uL   RBC Morphology POLYCHROMASIA PRESENT     Comment: TEARDROP CELLS   WBC Morphology VACUOLATED NEUTROPHILS     Smear Review PLATELETS APPEAR DECREASED     Comment: LARGE PLATELETS PRESENT  PREPARE PLATELET PHERESIS     Status: None   Collection Time    05/26/14 10:30 PM      Result Value Ref Range   Unit Number [id number]     Blood Component Type PLTPHER LR1     Unit division 00     Status of Unit ISSUED     Transfusion Status OK TO TRANSFUSE     Ct Abdomen Pelvis W Contrast  05/26/2014   CLINICAL DATA:  Right lower quadrant pain. Nausea. Initial evaluation  EXAM: CT ABDOMEN AND PELVIS WITH CONTRAST  TECHNIQUE: Multidetector CT imaging of the abdomen and pelvis was performed using the standard protocol following bolus administration of intravenous contrast.  CONTRAST:  119m OMNIPAQUE IOHEXOL 300 MG/ML  SOLN  COMPARISON:  Ultrasound 01/02/2012.  FINDINGS: Liver normal. Spleen normal. Pancreas normal. No biliary distention. The gallbladder is nondistended.  Adrenals are normal. No focal renal abnormality. No hydronephrosis or obstructing ureteral stone. The bladder is nondistended. Mild endometrial thickening. This may be physiologic. No adnexal mass. Small amount of free pelvic fluid.  Multiple inguinal, iliac, retroperitoneal lymph nodes noted. Inguinal and iliac lymph nodes up to 1.1 cm noted. Retroperitoneal lymph nodes up to 8 mm. Abdominal aorta normal in caliber and widely patent. Visceral vessels patent. Portal vein patent.  Appendix is distended to 1.3 cm. This is consistent with appendicitis. Mild periappendiceal fat planes edema noted. No evidence of bowel obstruction. Stomach is  nondistended. No free air. No mesenteric mass lesion. No significant abdominal wall hernia.  Mild subsegmental atelectasis left lung base. Heart size normal. No acute bony abnormality.  IMPRESSION: 1. Appendicitis. 2. Bilateral inguinal/iliac and retroperitoneal prominent lymph nodes. Following resolution of the patient's acute  illness, follow-up CT of the abdomen and pelvis to demonstrate resolution of adenopathy suggested .   Electronically Signed   By: Marcello Moores  Register   On: 05/26/2014 20:39    Review of Systems  Constitutional: Negative for weight loss.  HENT: Negative for ear discharge, ear pain, hearing loss and tinnitus.   Eyes: Negative for blurred vision, double vision, photophobia and pain.  Respiratory: Negative for cough, sputum production and shortness of breath.   Cardiovascular: Negative for chest pain.  Gastrointestinal: Positive for nausea and abdominal pain. Negative for vomiting.  Genitourinary: Negative for dysuria, urgency, frequency and flank pain.  Musculoskeletal: Negative for back pain, falls, joint pain, myalgias and neck pain.  Neurological: Negative for dizziness, tingling, sensory change, focal weakness, loss of consciousness and headaches.  Endo/Heme/Allergies: Does not bruise/bleed easily.  Psychiatric/Behavioral: Negative for depression, memory loss and substance abuse. The patient is not nervous/anxious.     Blood pressure 149/80, pulse 107, temperature 99.2 F (37.3 C), temperature source Oral, resp. rate 24, last menstrual period 05/05/2014, SpO2 99.00%. Physical Exam  WDWN in NAD HEENT:  EOMI, sclera anicteric Neck:  No masses, no thyromegaly Lungs:  CTA bilaterally; normal respiratory effort CV:  Regular rate and rhythm; no murmurs Abd:  Soft, moderate RLQ tenderness Ext:  Well-perfused; no edema Skin:  Warm, dry; no sign of jaundice; bruising along upper arms and at IV/ lab draw sites  Assessment/Plan 1.  Acute appendicitis with no sign of  perforation 2.  Thrombocytopenia - unclear etiology  3.  Graves disease - hyperthyroidism - not being treated  Plan:  Admit for IV antibiotics - unsafe for surgery at this time Transfuse platelets Repeat CBC in AM Hematology consult regarding etiology of profound thrombocytopenia.  Maryori Weide K. 05/26/2014, 10:38 PM

## 2014-05-26 NOTE — ED Notes (Signed)
CRITICAL VALUE ALERT  Critical value received:  Platelets 12  Date of notification:  05/26/2014  Time of notification:  1940  Critical value read back:  Nurse who received alert:   MD notified (1st page):   Time of first page:   MD notified (2nd page):  Time of second page:  Responding MD:    Time MD responded:

## 2014-05-26 NOTE — ED Provider Notes (Signed)
CSN: [id number]     Arrival date & time 05/26/14  1318 History   First MD Initiated Contact with Patient 05/26/14 1704     Chief Complaint  Patient presents with  . Abdominal Pain     (Consider location/radiation/quality/duration/timing/severity/associated sxs/prior Treatment) Patient is a 23 y.o. female presenting with abdominal pain. The history is provided by the patient.  Abdominal Pain Pain location:  RLQ Pain quality: aching   Pain radiates to:  Does not radiate Pain severity:  Mild Onset quality:  Gradual Duration:  1 day Timing:  Constant Progression:  Worsening Chronicity:  New Context: not previous surgeries, not recent illness and not trauma   Context comment:  Patient woke up with abdominal pain this morning Relieved by:  Nothing Worsened by:  Palpation Ineffective treatments:  None tried Associated symptoms: anorexia and nausea   Associated symptoms: no chest pain, no chills, no cough, no diarrhea, no dysuria, no fatigue, no fever, no hematemesis, no hematochezia, no hematuria, no shortness of breath, no vaginal bleeding, no vaginal discharge and no vomiting   Risk factors: obesity   Risk factors: no alcohol abuse, not elderly, has not had multiple surgeries and not pregnant     Past Medical History  Diagnosis Date  . Hyperthyroidism   . Hypercholesteremia   . Ovarian cyst   . Anemia   . Abortion May 2013  . Atrial fibrillation    History reviewed. No pertinent past surgical history. Family History  Problem Relation Age of Onset  . Diabetes Other     Parent  . Cancer Other     Breast Cancer-Parent  . Hypertension Other     Parent  . Hyperlipidemia Other     Parent  . Arthritis Other     Parent   History  Substance Use Topics  . Smoking status: Current Some Day Smoker -- 0.25 packs/day    Types: Cigarettes  . Smokeless tobacco: Not on file  . Alcohol Use: 0.0 oz/week    0 Glasses of wine, 0 Shots of liquor per week     Comment: occasionally    OB History   Grav Para Term Preterm Abortions TAB SAB Ect Mult Living   1              Review of Systems  Constitutional: Negative for fever, chills, activity change, appetite change and fatigue.  HENT: Negative for congestion and rhinorrhea.   Eyes: Negative for discharge, redness and itching.  Respiratory: Negative for cough, shortness of breath and wheezing.   Cardiovascular: Negative for chest pain.  Gastrointestinal: Positive for nausea, abdominal pain and anorexia. Negative for vomiting, diarrhea, hematochezia and hematemesis.  Genitourinary: Negative for dysuria, hematuria, vaginal bleeding and vaginal discharge.  Musculoskeletal: Negative for back pain.  Skin: Negative for rash and wound.  Neurological: Negative for syncope.      Allergies  Benadryl and Chloroxine  Home Medications   Prior to Admission medications   Medication Sig Start Date End Date Taking? Authorizing Provider  traMADol (ULTRAM) 50 MG tablet Take 50 mg by mouth every 6 (six) hours as needed for moderate pain.   Yes Historical Provider, MD   BP 145/73  Pulse 106  Temp(Src) 98.9 F (37.2 C) (Oral)  Resp 24  SpO2 100%  LMP 05/05/2014 Physical Exam  Constitutional: She is oriented to person, place, and time. She appears well-developed and well-nourished. No distress.  HENT:  Head: Normocephalic and atraumatic.  Mouth/Throat: Oropharynx is clear and moist. No oropharyngeal exudate.  Eyes: Conjunctivae and EOM are normal. Pupils are equal, round, and reactive to light. Right eye exhibits no discharge. Left eye exhibits no discharge. No scleral icterus.  Neck: Normal range of motion. Neck supple.  Cardiovascular: Normal rate, regular rhythm and normal heart sounds.   No murmur heard. Pulmonary/Chest: Effort normal and breath sounds normal. No respiratory distress. She has no wheezes. She has no rales.  Abdominal: Soft. She exhibits no distension and no mass. There is tenderness.  Right lower  quadrant normal pain with positive tenderness at McBurney's point Negative Murphy Negative Rovsing  Negative obturator negative psoas No CVA tenderness  Genitourinary:  Physiological discharge in vault No blood Cervix normal no CMT No adnexal tenderness to palpation or masses  Neurological: She is alert and oriented to person, place, and time. She exhibits normal muscle tone. Coordination normal.  Skin: Skin is warm. No rash noted. She is not diaphoretic.    ED Course  Procedures (including critical care time) Labs Review Labs Reviewed  WET PREP, GENITAL - Abnormal; Notable for the following:    Clue Cells Wet Prep HPF POC FEW (*)    WBC, Wet Prep HPF POC MODERATE (*)    All other components within normal limits  COMPREHENSIVE METABOLIC PANEL - Abnormal; Notable for the following:    Sodium 136 (*)    Glucose, Bld 117 (*)    Creatinine, Ser 0.36 (*)    All other components within normal limits  URINALYSIS, ROUTINE W REFLEX MICROSCOPIC - Abnormal; Notable for the following:    Ketones, ur 15 (*)    Leukocytes, UA TRACE (*)    All other components within normal limits  CBC WITH DIFFERENTIAL - Abnormal; Notable for the following:    WBC 19.7 (*)    Hemoglobin 11.2 (*)    HCT 34.0 (*)    MCV 73.1 (*)    MCH 24.1 (*)    Platelets 12 (*)    Neutrophils Relative % 86 (*)    Lymphocytes Relative 6 (*)    Neutro Abs 16.9 (*)    Monocytes Absolute 1.6 (*)    All other components within normal limits  CBC WITH DIFFERENTIAL - Abnormal; Notable for the following:    WBC 17.1 (*)    Hemoglobin 10.8 (*)    HCT 33.1 (*)    MCV 75.2 (*)    MCH 24.5 (*)    RDW 15.6 (*)    Platelets 31 (*)    Neutrophils Relative % 85 (*)    Lymphocytes Relative 7 (*)    Neutro Abs 14.5 (*)    Monocytes Absolute 1.4 (*)    All other components within normal limits  GC/CHLAMYDIA PROBE AMP  URINE MICROSCOPIC-ADD ON  CBC WITH DIFFERENTIAL  TSH  T4, FREE  POC URINE PREG, ED  TYPE AND SCREEN   PREPARE PLATELET PHERESIS    Imaging Review Ct Abdomen Pelvis W Contrast  05/26/2014   CLINICAL DATA:  Right lower quadrant pain. Nausea. Initial evaluation  EXAM: CT ABDOMEN AND PELVIS WITH CONTRAST  TECHNIQUE: Multidetector CT imaging of the abdomen and pelvis was performed using the standard protocol following bolus administration of intravenous contrast.  CONTRAST:  100mL OMNIPAQUE IOHEXOL 300 MG/ML  SOLN  COMPARISON:  Ultrasound 01/02/2012.  FINDINGS: Liver normal. Spleen normal. Pancreas normal. No biliary distention. The gallbladder is nondistended.  Adrenals are normal. No focal renal abnormality. No hydronephrosis or obstructing ureteral stone. The bladder is nondistended. Mild endometrial thickening. This may be physiologic.  No adnexal mass. Small amount of free pelvic fluid.  Multiple inguinal, iliac, retroperitoneal lymph nodes noted. Inguinal and iliac lymph nodes up to 1.1 cm noted. Retroperitoneal lymph nodes up to 8 mm. Abdominal aorta normal in caliber and widely patent. Visceral vessels patent. Portal vein patent.  Appendix is distended to 1.3 cm. This is consistent with appendicitis. Mild periappendiceal fat planes edema noted. No evidence of bowel obstruction. Stomach is nondistended. No free air. No mesenteric mass lesion. No significant abdominal wall hernia.  Mild subsegmental atelectasis left lung base. Heart size normal. No acute bony abnormality.  IMPRESSION: 1. Appendicitis. 2. Bilateral inguinal/iliac and retroperitoneal prominent lymph nodes. Following resolution of the patient's acute illness, follow-up CT of the abdomen and pelvis to demonstrate resolution of adenopathy suggested .   Electronically Signed   By: Maisie Fus  Register   On: 05/26/2014 20:39     EKG Interpretation None      MDM   MDM: 23 year old female comes in with right lower quadrant abdominal pain. One day with nausea. Right lower quadrant tenderness to palpation. Concern for appy. Labs showed  leukocytosis and severe thrombocytopenia. CT scan shows a appy. Surgery consult for admission. Initially discussed with heme onc who believes related to infection. Will have formal consult. Patient given 3 units of platelets for correction for anticipated surgery. Morphine for pain in the ED. Admit.  Final diagnoses:  Other acute appendicitis  Thrombocytopenia    Admit to Surgery   Pilar Jarvis, MD 05/26/14 2310

## 2014-05-26 NOTE — Progress Notes (Signed)
[facility] [phone]   [phone]  CONSULT NOTE  REFERRING PHYSICIAN: Dr. Corliss Skainssuei  REASON FOR CONSULTATION:  Urgent consult on 23 years old AAF with thrombocytopenia.  HPI Kellie Shaffer is a 23 y.o. female with PMH significant for Hyperthyroidism and dyslipidemia. She has a history of anemia secondary to heavy menstruation and does not use her iron supplements as recommended. She has no prior history of bleeding disorders. She presented to the ED earlier today complaining of RLQ abdominal pain and low grade fever started earlier this morning. She took Tramadol with no improvement. CT of the abdomen and pelvis showed evidence for appendicitis. There were also Multiple inguinal, iliac, retroperitoneal lymph nodes noted. Inguinal and iliac lymph nodes up to 1.1 cm noted. Retroperitoneal lymph nodes up to 8 mm. CBC earlier today showed leukocytosis with WBC of 19.7, Hb 11.2 and platelet count of 12K. Repeat CBC before platelet transfusion showed Platelet count up to 31K.  I was consulted to evaluate her Thrombocytopenia before Appendectomy. The patient is feeling fine with mild RLQ abdominal pain. She has mild bruises and minor bleeding at the IV access in the arm used for BP measurement cuff.  No Family history of Bleeding disorders. No history of HIV or Hepatitis. No recent OCT medication except for few doses of Ibuprofen. She is single with no children.   HPI  Past Medical History  Diagnosis Date  . Hyperthyroidism   . Hypercholesteremia   . Ovarian cyst   . Anemia   . Abortion May 2013  . Atrial fibrillation     History reviewed. No pertinent past surgical history.  Family History  Problem Relation Age of Onset  . Diabetes Other     Parent  . Cancer Other     Breast Cancer-Parent  . Hypertension Other     Parent  . Hyperlipidemia Other     Parent  . Arthritis Other     Parent    Social History History  Substance Use  Topics  . Smoking status: Current Some Day Smoker -- 0.25 packs/day    Types: Cigarettes  . Smokeless tobacco: Not on file  . Alcohol Use: 0.0 oz/week    0 Glasses of wine, 0 Shots of liquor per week     Comment: occasionally    Allergies  Allergen Reactions  . Benadryl [Diphenhydramine Hcl] Anaphylaxis and Hives  . Chloroxine Hives    No current facility-administered medications for this encounter.   Current Outpatient Prescriptions  Medication Sig Dispense Refill  . traMADol (ULTRAM) 50 MG tablet Take 50 mg by mouth every 6 (six) hours as needed for moderate pain.        Review of Systems  Constitutional: positive for fatigue and fevers Eyes: negative Ears, nose, mouth, throat, and face: negative Respiratory: negative Cardiovascular: negative Gastrointestinal: positive for abdominal pain Genitourinary:negative Integument/breast: negative Hematologic/lymphatic: negative Musculoskeletal:negative Neurological: negative Behavioral/Psych: negative Endocrine: negative Allergic/Immunologic: negative  Physical Exam  WNU:UVOZDRAL:alert, healthy, no distress, well nourished and well developed SKIN: skin color, texture, turgor are normal, no rashes or significant lesions HEAD: Normocephalic, No masses, lesions, tenderness or abnormalities EYES: normal, PERRLA EARS: External ears normal, Canals clear OROPHARYNX:no exudate, no erythema and lips, buccal mucosa, and tongue normal  NECK: supple, no adenopathy LYMPH:  no palpable lymphadenopathy, no hepatosplenomegaly BREAST:not examined LUNGS: clear to auscultation , and palpation HEART: regular rate & rhythm and no murmurs ABDOMEN:Tender to palpation at the RLQ BACK: Back symmetric, no curvature., No  CVA tenderness EXTREMITIES:no joint deformities, effusion, or inflammation, no edema  NEURO: alert & oriented x 3 with fluent speech, no focal motor/sensory deficits  PERFORMANCE STATUS: ECOG 0  LABORATORY DATA: Lab Results    Component Value Date   WBC 17.1* 05/26/2014   HGB 10.8* 05/26/2014   HCT 33.1* 05/26/2014   MCV 75.2* 05/26/2014   PLT 31* 05/26/2014    @LASTCHEM @  RADIOGRAPHIC STUDIES: Ct Abdomen Pelvis W Contrast  05/26/2014   CLINICAL DATA:  Right lower quadrant pain. Nausea. Initial evaluation  EXAM: CT ABDOMEN AND PELVIS WITH CONTRAST  TECHNIQUE: Multidetector CT imaging of the abdomen and pelvis was performed using the standard protocol following bolus administration of intravenous contrast.  CONTRAST:  100mL OMNIPAQUE IOHEXOL 300 MG/ML  SOLN  COMPARISON:  Ultrasound 01/02/2012.  FINDINGS: Liver normal. Spleen normal. Pancreas normal. No biliary distention. The gallbladder is nondistended.  Adrenals are normal. No focal renal abnormality. No hydronephrosis or obstructing ureteral stone. The bladder is nondistended. Mild endometrial thickening. This may be physiologic. No adnexal mass. Small amount of free pelvic fluid.  Multiple inguinal, iliac, retroperitoneal lymph nodes noted. Inguinal and iliac lymph nodes up to 1.1 cm noted. Retroperitoneal lymph nodes up to 8 mm. Abdominal aorta normal in caliber and widely patent. Visceral vessels patent. Portal vein patent.  Appendix is distended to 1.3 cm. This is consistent with appendicitis. Mild periappendiceal fat planes edema noted. No evidence of bowel obstruction. Stomach is nondistended. No free air. No mesenteric mass lesion. No significant abdominal wall hernia.  Mild subsegmental atelectasis left lung base. Heart size normal. No acute bony abnormality.  IMPRESSION: 1. Appendicitis. 2. Bilateral inguinal/iliac and retroperitoneal prominent lymph nodes. Following resolution of the patient's acute illness, follow-up CT of the abdomen and pelvis to demonstrate resolution of adenopathy suggested .   Electronically Signed   By: [name]  Register   On: 05/26/2014 20:39    ASSESSMENT: This is a very pleasant 23 YO AAF presented with Appendicitis and low platelet  count. Review of the peripheral blood smear showed Neutrophilia as well as low platelet count but large platelet. No Schistocytes. The patient has no other criteria concerning for TTP. She has no previous heparin exposure for consideration of HIT. This is most likely secondary to consumption process secondary to the recent infection.   PLAN: 1) order DIC panel. 2) Because of the current emergency situation, I would recommend to proceed with Surgery after 1-2 units of platelet transfusion to maintain her platelet count close to 50 K. 3) Daily CBC to monitor her recovery of platelet count and if her count is trending up and over 50 K. She can follow up with her PCP in 1-2 weeks for repeat count to ensure normalization of her count. 4) Resume iron tab for the iron deficiency anemia.  The patient voices understanding of current disease status and treatment options and is in agreement with the current care plan.  All questions were answered. The patient knows to call the clinic with any problems, questions or concerns. We can certainly see the patient much sooner if necessary.  Thank you so much for allowing me to participate in the care of Kellie Shaffer. I will continue to follow up the patient with you and assist in her care. Please call if you have any questions.  Disclaimer: This note was dictated with voice recognition software. Similar sounding words can inadvertently be transcribed and may not be corrected upon review.   Vaidehi Braddy K. 05/26/2014, 11:33 PM

## 2014-05-26 NOTE — ED Notes (Signed)
Communication with CT pt ready  

## 2014-05-27 DIAGNOSIS — D696 Thrombocytopenia, unspecified: Secondary | ICD-10-CM

## 2014-05-27 DIAGNOSIS — D508 Other iron deficiency anemias: Secondary | ICD-10-CM

## 2014-05-27 DIAGNOSIS — E0500 Thyrotoxicosis with diffuse goiter without thyrotoxic crisis or storm: Secondary | ICD-10-CM

## 2014-05-27 DIAGNOSIS — I4891 Unspecified atrial fibrillation: Secondary | ICD-10-CM

## 2014-05-27 DIAGNOSIS — R599 Enlarged lymph nodes, unspecified: Secondary | ICD-10-CM

## 2014-05-27 LAB — DIC (DISSEMINATED INTRAVASCULAR COAGULATION) PANEL
INR: 1.27 (ref 0.00–1.49)
PLATELETS: 10 10*3/uL — AB (ref 150–400)
PROTHROMBIN TIME: 15.9 s — AB (ref 11.6–15.2)

## 2014-05-27 LAB — DIC (DISSEMINATED INTRAVASCULAR COAGULATION)PANEL
D-Dimer, Quant: 1.14 ug/mL-FEU — ABNORMAL HIGH (ref 0.00–0.48)
Fibrinogen: 451 mg/dL (ref 204–475)
Smear Review: NONE SEEN
aPTT: 40 seconds — ABNORMAL HIGH (ref 24–37)

## 2014-05-27 LAB — CBC
HCT: 28.8 % — ABNORMAL LOW (ref 36.0–46.0)
Hemoglobin: 9.3 g/dL — ABNORMAL LOW (ref 12.0–15.0)
MCH: 23.7 pg — ABNORMAL LOW (ref 26.0–34.0)
MCHC: 32.3 g/dL (ref 30.0–36.0)
MCV: 73.3 fL — AB (ref 78.0–100.0)
PLATELETS: 16 10*3/uL — AB (ref 150–400)
RBC: 3.93 MIL/uL (ref 3.87–5.11)
RDW: 15.3 % (ref 11.5–15.5)
WBC: 10 10*3/uL (ref 4.0–10.5)

## 2014-05-27 LAB — COMPREHENSIVE METABOLIC PANEL
ALT: 17 U/L (ref 0–35)
ANION GAP: 12 (ref 5–15)
AST: 13 U/L (ref 0–37)
Albumin: 3.7 g/dL (ref 3.5–5.2)
Alkaline Phosphatase: 69 U/L (ref 39–117)
BILIRUBIN TOTAL: 1.5 mg/dL — AB (ref 0.3–1.2)
BUN: 6 mg/dL (ref 6–23)
CALCIUM: 9.3 mg/dL (ref 8.4–10.5)
CHLORIDE: 101 meq/L (ref 96–112)
CO2: 23 meq/L (ref 19–32)
Creatinine, Ser: 0.43 mg/dL — ABNORMAL LOW (ref 0.50–1.10)
GLUCOSE: 81 mg/dL (ref 70–99)
Potassium: 3.5 mEq/L — ABNORMAL LOW (ref 3.7–5.3)
Sodium: 136 mEq/L — ABNORMAL LOW (ref 137–147)
Total Protein: 7.9 g/dL (ref 6.0–8.3)

## 2014-05-27 LAB — HIV ANTIBODY (ROUTINE TESTING W REFLEX): HIV: NONREACTIVE

## 2014-05-27 LAB — T4, FREE: Free T4: 3.48 ng/dL — ABNORMAL HIGH (ref 0.80–1.80)

## 2014-05-27 LAB — PLATELET COUNT: Platelets: <5 10*3/uL — CL (ref 150–400)

## 2014-05-27 LAB — TSH: TSH: 0.044 u[IU]/mL — ABNORMAL LOW (ref 0.350–4.500)

## 2014-05-27 MED ORDER — HYDROMORPHONE HCL 1 MG/ML IJ SOLN
0.5000 mg | INTRAMUSCULAR | Status: DC | PRN
  Administered 2014-05-27 (×2): 1 mg via INTRAVENOUS
  Administered 2014-05-27: 2 mg via INTRAVENOUS
  Administered 2014-05-28 (×2): 1 mg via INTRAVENOUS
  Filled 2014-05-27: qty 1
  Filled 2014-05-27: qty 2
  Filled 2014-05-27: qty 1
  Filled 2014-05-27: qty 2

## 2014-05-27 MED ORDER — POTASSIUM CHLORIDE IN NACL 20-0.9 MEQ/L-% IV SOLN
INTRAVENOUS | Status: DC
  Administered 2014-05-27 – 2014-05-30 (×7): via INTRAVENOUS
  Filled 2014-05-27 (×12): qty 1000

## 2014-05-27 MED ORDER — SODIUM CHLORIDE 0.9 % IV SOLN
Freq: Once | INTRAVENOUS | Status: DC
Start: 2014-05-27 — End: 2014-05-31

## 2014-05-27 MED ORDER — DEXTROSE 5 % IV SOLN
1.0000 g | Freq: Four times a day (QID) | INTRAVENOUS | Status: DC
  Administered 2014-05-27 – 2014-05-28 (×6): 1 g via INTRAVENOUS
  Filled 2014-05-27 (×8): qty 1

## 2014-05-27 MED ORDER — ONDANSETRON HCL 4 MG/2ML IJ SOLN: 4.0000 mg | Freq: Four times a day (QID) | INTRAMUSCULAR | Status: DC | PRN

## 2014-05-27 MED ORDER — PANTOPRAZOLE SODIUM 40 MG IV SOLR
40.0000 mg | Freq: Every day | INTRAVENOUS | Status: DC
  Administered 2014-05-27 – 2014-05-30 (×4): 40 mg via INTRAVENOUS
  Filled 2014-05-27 (×6): qty 40

## 2014-05-27 MED ORDER — MORPHINE SULFATE 2 MG/ML IJ SOLN
2.0000 mg | INTRAMUSCULAR | Status: DC | PRN
  Administered 2014-05-27 (×6): 4 mg via INTRAVENOUS
  Filled 2014-05-27 (×6): qty 2

## 2014-05-27 MED ORDER — METHYLPREDNISOLONE SODIUM SUCC 125 MG IJ SOLR
90.0000 mg | Freq: Two times a day (BID) | INTRAMUSCULAR | Status: DC
  Administered 2014-05-27 – 2014-05-31 (×8): 90 mg via INTRAVENOUS
  Filled 2014-05-27: qty 2
  Filled 2014-05-27 (×10): qty 1.44

## 2014-05-27 NOTE — Progress Notes (Signed)
CRITICAL VALUE ALERT  Critical value received:  Platelets <5  Date of notification:  05/27/14  Time of notification:  1935  Critical value read back:Yes.    Nurse who received alert: Leonie ManNancy Psalm Schappell  MD notified (1st page):  Lindie SpruceWyatt  Time of first page:1948  MD notified (2nd page):  Time of second page:  Responding MD:  Lindie SpruceWyatt  Time MD responded: 1950

## 2014-05-27 NOTE — Progress Notes (Signed)
Patient ID: Kellie Shaffer, female   DOB: [birth year], 23 y.o.   MRN: [id number] [facility] Surgery Progress Note:   * No surgery found *  Subjective: Mental status is clear.  Patient still with localized right lower quadrant pain.  Platelet count is 16K after transfusions.   Objective: Vital signs in last 24 hours: Temp:  [97.8 F (36.6 C)-99.4 F (37.4 C)] 97.8 F (36.6 C) (10/11 0431) Pulse Rate:  [86-107] 92 (10/11 0431) Resp:  [15-24] 18 (10/11 0431) BP: (127-178)/(64-106) 142/77 mmHg (10/11 0431) SpO2:  [98 %-100 %] 100 % (10/11 0244) Weight:  [201 lb 4.5 oz (91.3 kg)] 201 lb 4.5 oz (91.3 kg) (10/11 0036)  Intake/Output from previous day: 10/10 0701 - 10/11 0700 In: 1282.3 [I.V.:263.3; Blood:1019] Out: 400 [Urine:400] Intake/Output this shift:    Physical Exam: Work of breathing is normal.  Pain at McBurney's point  Lab Results:  Results for orders placed during the hospital encounter of 05/26/14 (from the past 48 hour(s))  COMPREHENSIVE METABOLIC PANEL     Status: Abnormal   Collection Time    05/26/14  2:00 PM      Result Value Ref Range   Sodium 136 (*) 137 - 147 mEq/L   Potassium 3.7  3.7 - 5.3 mEq/L   Chloride 102  96 - 112 mEq/L   CO2 20  19 - 32 mEq/L   Glucose, Bld 117 (*) 70 - 99 mg/dL   BUN 6  6 - 23 mg/dL   Creatinine, Ser 0.36 (*) 0.50 - 1.10 mg/dL   Calcium 9.3  8.4 - 10.5 mg/dL   Total Protein 8.1  6.0 - 8.3 g/dL   Albumin 4.0  3.5 - 5.2 g/dL   AST 17  0 - 37 U/L   ALT 23  0 - 35 U/L   Alkaline Phosphatase 84  39 - 117 U/L   Total Bilirubin 1.0  0.3 - 1.2 mg/dL   GFR calc non Af Amer >90  >90 mL/min   GFR calc Af Amer >90  >90 mL/min   Comment: (NOTE)     The eGFR has been calculated using the CKD EPI equation.     This calculation has not been validated in all clinical situations.     eGFR's persistently <90 mL/min signify possible Chronic Kidney     Disease.   Anion gap 14  5 - 15  URINALYSIS, ROUTINE W REFLEX MICROSCOPIC     Status:  Abnormal   Collection Time    05/26/14  5:40 PM      Result Value Ref Range   Color, Urine YELLOW  YELLOW   APPearance CLEAR  CLEAR   Specific Gravity, Urine 1.018  1.005 - 1.030   pH 6.0  5.0 - 8.0   Glucose, UA NEGATIVE  NEGATIVE mg/dL   Hgb urine dipstick NEGATIVE  NEGATIVE   Bilirubin Urine NEGATIVE  NEGATIVE   Ketones, ur 15 (*) NEGATIVE mg/dL   Protein, ur NEGATIVE  NEGATIVE mg/dL   Urobilinogen, UA 1.0  0.0 - 1.0 mg/dL   Nitrite NEGATIVE  NEGATIVE   Leukocytes, UA TRACE (*) NEGATIVE  URINE MICROSCOPIC-ADD ON     Status: None   Collection Time    05/26/14  5:40 PM      Result Value Ref Range   Squamous Epithelial / LPF RARE  RARE   WBC, UA 0-2  <3 WBC/hpf   Urine-Other MUCOUS PRESENT    POC URINE PREG, ED  Status: None   Collection Time    05/26/14  5:56 PM      Result Value Ref Range   Preg Test, Ur NEGATIVE  NEGATIVE   Comment:            THE SENSITIVITY OF THIS     METHODOLOGY IS >24 mIU/mL  WET PREP, GENITAL     Status: Abnormal   Collection Time    05/26/14  6:03 PM      Result Value Ref Range   Yeast Wet Prep HPF POC NONE SEEN  NONE SEEN   Trich, Wet Prep NONE SEEN  NONE SEEN   Clue Cells Wet Prep HPF POC FEW (*) NONE SEEN   WBC, Wet Prep HPF POC MODERATE (*) NONE SEEN  CBC WITH DIFFERENTIAL     Status: Abnormal   Collection Time    05/26/14  6:18 PM      Result Value Ref Range   WBC 19.7 (*) 4.0 - 10.5 K/uL   Comment: REPEATED TO VERIFY     WHITE COUNT CONFIRMED ON SMEAR   RBC 4.65  3.87 - 5.11 MIL/uL   Hemoglobin 11.2 (*) 12.0 - 15.0 g/dL   HCT 34.0 (*) 36.0 - 46.0 %   MCV 73.1 (*) 78.0 - 100.0 fL   MCH 24.1 (*) 26.0 - 34.0 pg   MCHC 32.9  30.0 - 36.0 g/dL   RDW 15.2  11.5 - 15.5 %   Platelets 12 (*) 150 - 400 K/uL   Comment: SPECIMEN CHECKED FOR CLOTS     REPEATED TO VERIFY     PLATELET COUNT CONFIRMED BY SMEAR     CRITICAL RESULT CALLED TO, READ BACK BY AND VERIFIED WITH:     Margarita Sermons [id number] SHIPMAN M   Neutrophils Relative % 86  (*) 43 - 77 %   Lymphocytes Relative 6 (*) 12 - 46 %   Monocytes Relative 8  3 - 12 %   Eosinophils Relative 0  0 - 5 %   Basophils Relative 0  0 - 1 %   Neutro Abs 16.9 (*) 1.7 - 7.7 K/uL   Lymphs Abs 1.2  0.7 - 4.0 K/uL   Monocytes Absolute 1.6 (*) 0.1 - 1.0 K/uL   Eosinophils Absolute 0.0  0.0 - 0.7 K/uL   Basophils Absolute 0.0  0.0 - 0.1 K/uL   WBC Morphology TOXIC GRANULATION     Smear Review LARGE PLATELETS PRESENT    TYPE AND SCREEN     Status: None   Collection Time    05/26/14  8:57 PM      Result Value Ref Range   ABO/RH(D) O POS     Antibody Screen NEG     Sample Expiration 05/29/2014    CBC WITH DIFFERENTIAL     Status: Abnormal   Collection Time    05/26/14  9:07 PM      Result Value Ref Range   WBC 17.1 (*) 4.0 - 10.5 K/uL   Comment: WHITE COUNT CONFIRMED ON SMEAR   RBC 4.40  3.87 - 5.11 MIL/uL   Hemoglobin 10.8 (*) 12.0 - 15.0 g/dL   HCT 33.1 (*) 36.0 - 46.0 %   MCV 75.2 (*) 78.0 - 100.0 fL   MCH 24.5 (*) 26.0 - 34.0 pg   MCHC 32.6  30.0 - 36.0 g/dL   RDW 15.6 (*) 11.5 - 15.5 %   Platelets 31 (*) 150 - 400 K/uL   Comment: PLATELET COUNT CONFIRMED BY  SMEAR     SPECIMEN CHECKED FOR CLOTS     REPEATED TO VERIFY     DELTA CHECK NOTED   Neutrophils Relative % 85 (*) 43 - 77 %   Lymphocytes Relative 7 (*) 12 - 46 %   Monocytes Relative 8  3 - 12 %   Eosinophils Relative 0  0 - 5 %   Basophils Relative 0  0 - 1 %   Neutro Abs 14.5 (*) 1.7 - 7.7 K/uL   Lymphs Abs 1.2  0.7 - 4.0 K/uL   Monocytes Absolute 1.4 (*) 0.1 - 1.0 K/uL   Eosinophils Absolute 0.0  0.0 - 0.7 K/uL   Basophils Absolute 0.0  0.0 - 0.1 K/uL   RBC Morphology POLYCHROMASIA PRESENT     Comment: TEARDROP CELLS   WBC Morphology VACUOLATED NEUTROPHILS     Smear Review PLATELETS APPEAR DECREASED     Comment: LARGE PLATELETS PRESENT  PREPARE PLATELET PHERESIS     Status: None   Collection Time    05/26/14 10:30 PM      Result Value Ref Range   Unit Number [id number]     Blood Component  Type PLTPHER LR1     Unit division 00     Status of Unit ISSUED     Transfusion Status OK TO TRANSFUSE     Unit Number [id number]     Blood Component Type PLTPHER LRI1     Unit division 00     Status of Unit ISSUED     Transfusion Status OK TO TRANSFUSE     Unit Number [id number]     Blood Component Type PLTPHER LI2     Unit division 00     Status of Unit ISSUED     Transfusion Status OK TO TRANSFUSE    TSH     Status: Abnormal   Collection Time    05/27/14 12:15 AM      Result Value Ref Range   TSH 0.044 (*) 0.350 - 4.500 uIU/mL  DIC (DISSEMINATED INTRAVASCULAR COAGULATION) PANEL     Status: Abnormal   Collection Time    05/27/14 12:15 AM      Result Value Ref Range   Prothrombin Time 15.9 (*) 11.6 - 15.2 seconds   INR 1.27  0.00 - 1.49   aPTT 40 (*) 24 - 37 seconds   Comment:            IF BASELINE aPTT IS ELEVATED,     SUGGEST PATIENT RISK ASSESSMENT     BE USED TO DETERMINE APPROPRIATE     ANTICOAGULANT THERAPY.   Fibrinogen 451  204 - 475 mg/dL   D-Dimer, Quant 1.14 (*) 0.00 - 0.48 ug/mL-FEU   Comment:            AT THE INHOUSE ESTABLISHED CUTOFF     VALUE OF 0.48 ug/mL FEU,     THIS ASSAY HAS BEEN DOCUMENTED     IN THE LITERATURE TO HAVE     A SENSITIVITY AND NEGATIVE     PREDICTIVE VALUE OF AT LEAST     98 TO 99%.  THE TEST RESULT     SHOULD BE CORRELATED WITH     AN ASSESSMENT OF THE CLINICAL     PROBABILITY OF DVT / VTE.   Platelets 10 (*) 150 - 400 K/uL   Comment: CRITICAL RESULT CALLED TO, READ BACK BY AND VERIFIED WITH:      TO NIRISH(RN) BT [facility] 05/27/14 AT 1:40AM  Smear Review NO SCHISTOCYTES SEEN    CBC     Status: Abnormal   Collection Time    05/27/14  7:20 AM      Result Value Ref Range   WBC 10.0  4.0 - 10.5 K/uL   RBC 3.93  3.87 - 5.11 MIL/uL   Hemoglobin 9.3 (*) 12.0 - 15.0 g/dL   HCT 28.8 (*) 36.0 - 46.0 %   MCV 73.3 (*) 78.0 - 100.0 fL   MCH 23.7 (*) 26.0 - 34.0 pg   MCHC 32.3  30.0 - 36.0 g/dL   RDW 15.3  11.5 - 15.5 %    Platelets 16 (*) 150 - 400 K/uL   Comment: CONSISTENT WITH PREVIOUS RESULT     CRITICAL VALUE NOTED.  VALUE IS CONSISTENT WITH PREVIOUSLY REPORTED AND CALLED VALUE.    Radiology/Results: Ct Abdomen Pelvis W Contrast  05/26/2014   CLINICAL DATA:  Right lower quadrant pain. Nausea. Initial evaluation  EXAM: CT ABDOMEN AND PELVIS WITH CONTRAST  TECHNIQUE: Multidetector CT imaging of the abdomen and pelvis was performed using the standard protocol following bolus administration of intravenous contrast.  CONTRAST:  151m OMNIPAQUE IOHEXOL 300 MG/ML  SOLN  COMPARISON:  Ultrasound 01/02/2012.  FINDINGS: Liver normal. Spleen normal. Pancreas normal. No biliary distention. The gallbladder is nondistended.  Adrenals are normal. No focal renal abnormality. No hydronephrosis or obstructing ureteral stone. The bladder is nondistended. Mild endometrial thickening. This may be physiologic. No adnexal mass. Small amount of free pelvic fluid.  Multiple inguinal, iliac, retroperitoneal lymph nodes noted. Inguinal and iliac lymph nodes up to 1.1 cm noted. Retroperitoneal lymph nodes up to 8 mm. Abdominal aorta normal in caliber and widely patent. Visceral vessels patent. Portal vein patent.  Appendix is distended to 1.3 cm. This is consistent with appendicitis. Mild periappendiceal fat planes edema noted. No evidence of bowel obstruction. Stomach is nondistended. No free air. No mesenteric mass lesion. No significant abdominal wall hernia.  Mild subsegmental atelectasis left lung base. Heart size normal. No acute bony abnormality.  IMPRESSION: 1. Appendicitis. 2. Bilateral inguinal/iliac and retroperitoneal prominent lymph nodes. Following resolution of the patient's acute illness, follow-up CT of the abdomen and pelvis to demonstrate resolution of adenopathy suggested .   Electronically Signed   By: TMarcello Moores Register   On: 05/26/2014 20:39    Anti-infectives: Anti-infectives   Start     Dose/Rate Route Frequency Ordered  Stop   05/27/14 0030  cefOXitin (MEFOXIN) 1 g in dextrose 5 % 50 mL IVPB     1 g 100 mL/hr over 30 Minutes Intravenous 4 times per day 05/27/14 0018        Assessment/Plan: Problem List: Patient Active Problem List   Diagnosis Date Noted  . Thrombocytopenia 05/27/2014  . Acute appendicitis, uncomplicated 174/25/9563 . Hypercholesteremia    Thrombocytopenia persists with platelet count of 16K. .  Surgery on hold and treated with antibiotics for now.  Will check HIV.   * No surgery found *    LOS: 1 day   Matt B. MHassell Done MD, FSt Lukes [facility], [facility] [phone] [phone] 05/27/2014 8:33 AM

## 2014-05-27 NOTE — Progress Notes (Addendum)
IP PROGRESS NOTE  Subjective:   I was asked to see [name] for persistent severe thrombocytopenia. She was admitted yesterday with right abdominal pain and severe thrombocytopenia. The platelet count improved after a platelet transfusion. The platelet count is lower today.  She continues to have right lower abdominal pain. She reports a 2 week history of easy bruising prior to hospital admission. No fever. She otherwise feels well. No bleeding aside from the easy bruising.  Objective: Vital signs in last 24 hours: Blood pressure 157/75, pulse 96, temperature 98 F (36.7 C), temperature source Oral, resp. rate 20, height 5\' 9"  (1.753 m), weight 201 lb 4.5 oz (91.3 kg), last menstrual period 05/05/2014, SpO2 100.00%.  Intake/Output from previous day: 10/10 0701 - 10/11 0700 In: 1282.3 [I.V.:263.3; Blood:1019] Out: 400 [Urine:400]  Physical Exam:  HEENT: No thrush. Mouth without bleeding. Lungs: Clear bilaterally Cardiac: Regular rate and rhythm Abdomen: No hepatosplenomegaly, tender in the right lower quadrant, no mass Extremities: No leg edema Lymph nodes: "Shotty "cervical and right axillary nodes. No left axillary or inguinal nodes. Skin: Scattered ecchymoses over the extremities. No petechae    Lab Results:  Recent Labs  05/26/14 2107  05/27/14 0720 05/27/14 1826  WBC 17.1*  --  10.0  --   HGB 10.8*  --  9.3*  --   HCT 33.1*  --  28.8*  --   PLT 31*  < > 16* <5*  < > = values in this interval not displayed.  Blood smear from 05/27/2014-the red cells are microcytic. A few ovalocytes and teardrops. The polychromasia is not increased. Rare helmet cell. No schistocytes. The majority of the white cells are neutrophils. Many 5 lobed and one 6 lobed neutrophil A few atypical mononuclear cells and megakaryocyte nuclei. No blasts. Platelets were markedly decreased in number. Platelets are large. No platelet clumps. BMET  Recent Labs  05/26/14 1400 05/27/14 0720  NA  136* 136*  K 3.7 3.5*  CL 102 101  CO2 20 23  GLUCOSE 117* 81  BUN 6 6  CREATININE 0.36* 0.43*  CALCIUM 9.3 9.3   05/27/2014-HIV negative  Studies/Results: Ct Abdomen Pelvis W Contrast  05/26/2014   CLINICAL DATA:  Right lower quadrant pain. Nausea. Initial evaluation  EXAM: CT ABDOMEN AND PELVIS WITH CONTRAST  TECHNIQUE: Multidetector CT imaging of the abdomen and pelvis was performed using the standard protocol following bolus administration of intravenous contrast.  CONTRAST:  100mL OMNIPAQUE IOHEXOL 300 MG/ML  SOLN  COMPARISON:  Ultrasound 01/02/2012.  FINDINGS: Liver normal. Spleen normal. Pancreas normal. No biliary distention. The gallbladder is nondistended.  Adrenals are normal. No focal renal abnormality. No hydronephrosis or obstructing ureteral stone. The bladder is nondistended. Mild endometrial thickening. This may be physiologic. No adnexal mass. Small amount of free pelvic fluid.  Multiple inguinal, iliac, retroperitoneal lymph nodes noted. Inguinal and iliac lymph nodes up to 1.1 cm noted. Retroperitoneal lymph nodes up to 8 mm. Abdominal aorta normal in caliber and widely patent. Visceral vessels patent. Portal vein patent.  Appendix is distended to 1.3 cm. This is consistent with appendicitis. Mild periappendiceal fat planes edema noted. No evidence of bowel obstruction. Stomach is nondistended. No free air. No mesenteric mass lesion. No significant abdominal wall hernia.  Mild subsegmental atelectasis left lung base. Heart size normal. No acute bony abnormality.  IMPRESSION: 1. Appendicitis. 2. Bilateral inguinal/iliac and retroperitoneal prominent lymph nodes. Following resolution of the patient's acute illness, follow-up CT of the abdomen and pelvis to demonstrate resolution of  adenopathy suggested .   Electronically Signed   By: [name]  Register   On: 05/26/2014 20:39    Medications: I have reviewed the patient's current medications.  Assessment/Plan:  1. Severe  thrombocytopenia 2. Microcytic anemia-chronic 3. right lower quadrant pain and tenderness with CT evidence of acute appendicitis 4. history of Graves' disease 5. retroperitoneal and pelvic lymphadenopathy on the CT 05/26/2014 6. mild elevation of the PT and PTT 7. history of atrial fibrillation  [name] presents with severe thrombocytopenia. The thrombocytopenia is out of proportion to thrombocytopenia typically seen with an acute infection. She does not appear to have florid DIC. She reports a history of easy bruising for several weeks prior to hospital admission. 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  No evidence for acute leukemia.  At present I cannot relate the severe thrombocytopenia to the right lower quadrant pain and CT changes.  Recommendations: 1. Begin Solu-Medrol 2. Continue antibiotics 3. repeat DIC screen in the a.m. 05/28/2014 4. endocrine consult for management of hyperthyroidism 5. management of appendicitis per the surgical service  Hematology will see her daily.  LOS: 1 day   Roiza Wiedel  05/27/2014, 9:56 PM

## 2014-05-27 NOTE — ED Provider Notes (Signed)
I saw and evaluated the patient, reviewed the resident's note and I agree with the findings and plan.   .Face to face Exam:  General:  Awake HEENT:  Atraumatic Resp:  Normal effort Abd: Tender right lower quadrant Neuro:No focal weakness Lymph: No adenopathy   Nelia Shiobert L Marcelyn Ruppe, MD 05/27/14 0009

## 2014-05-28 ENCOUNTER — Telehealth: Payer: Self-pay | Admitting: Endocrinology

## 2014-05-28 DIAGNOSIS — R1031 Right lower quadrant pain: Secondary | ICD-10-CM

## 2014-05-28 LAB — PREPARE PLATELET PHERESIS
UNIT DIVISION: 0
Unit division: 0
Unit division: 0

## 2014-05-28 LAB — DIC (DISSEMINATED INTRAVASCULAR COAGULATION) PANEL: PROTHROMBIN TIME: 14.7 s (ref 11.6–15.2)

## 2014-05-28 LAB — BASIC METABOLIC PANEL
ANION GAP: 19 — AB (ref 5–15)
BUN: 8 mg/dL (ref 6–23)
CHLORIDE: 99 meq/L (ref 96–112)
CO2: 19 mEq/L (ref 19–32)
CREATININE: 0.37 mg/dL — AB (ref 0.50–1.10)
Calcium: 10 mg/dL (ref 8.4–10.5)
GFR calc non Af Amer: >90 mL/min (ref >90)
Glucose, Bld: 139 mg/dL — ABNORMAL HIGH (ref 70–99)
Potassium: 3.9 mEq/L (ref 3.7–5.3)
SODIUM: 137 meq/L (ref 137–147)

## 2014-05-28 LAB — CLOSTRIDIUM DIFFICILE BY PCR: CDIFFPCR: NEGATIVE

## 2014-05-28 LAB — DIC (DISSEMINATED INTRAVASCULAR COAGULATION)PANEL
D-Dimer, Quant: 2.96 ug/mL-FEU — ABNORMAL HIGH (ref 0.00–0.48)
Fibrinogen: 644 mg/dL — ABNORMAL HIGH (ref 204–475)
INR: 1.15 (ref 0.00–1.49)
Platelets: 23 10*3/uL — CL (ref 150–400)
Smear Review: NONE SEEN
aPTT: 33 seconds (ref 24–37)

## 2014-05-28 LAB — CBC
HCT: 32.5 % — ABNORMAL LOW (ref 36.0–46.0)
Hemoglobin: 10.2 g/dL — ABNORMAL LOW (ref 12.0–15.0)
MCH: 23.7 pg — ABNORMAL LOW (ref 26.0–34.0)
MCHC: 31.4 g/dL (ref 30.0–36.0)
MCV: 75.4 fL — AB (ref 78.0–100.0)
PLATELETS: 23 10*3/uL — AB (ref 150–400)
RBC: 4.31 MIL/uL (ref 3.87–5.11)
RDW: 15.3 % (ref 11.5–15.5)
WBC: 8.9 10*3/uL (ref 4.0–10.5)

## 2014-05-28 LAB — GC/CHLAMYDIA PROBE AMP
CT PROBE, AMP APTIMA: NEGATIVE
GC PROBE AMP APTIMA: NEGATIVE

## 2014-05-28 MED ORDER — CETIRIZINE HCL 5 MG/5ML PO SYRP
10.0000 mg | ORAL_SOLUTION | Freq: Every day | ORAL | Status: DC
  Administered 2014-05-28 – 2014-05-31 (×4): 10 mg via ORAL
  Filled 2014-05-28 (×4): qty 10

## 2014-05-28 MED ORDER — CETYLPYRIDINIUM CHLORIDE 0.05 % MT LIQD
7.0000 mL | Freq: Two times a day (BID) | OROMUCOSAL | Status: DC
  Administered 2014-05-28: 7 mL via OROMUCOSAL

## 2014-05-28 MED ORDER — CHLORHEXIDINE GLUCONATE 0.12 % MT SOLN
15.0000 mL | Freq: Two times a day (BID) | OROMUCOSAL | Status: DC
Start: 2014-05-28 — End: 2014-05-31
  Administered 2014-05-28: 15 mL via OROMUCOSAL
  Filled 2014-05-28 (×3): qty 15

## 2014-05-28 MED ORDER — ERTAPENEM SODIUM 1 G IJ SOLR
1.0000 g | INTRAMUSCULAR | Status: DC
  Administered 2014-05-28 – 2014-05-30 (×3): 1 g via INTRAVENOUS
  Filled 2014-05-28 (×4): qty 1

## 2014-05-28 NOTE — Progress Notes (Signed)
Patient ID: Kellie Shaffer, female   DOB: 10/23/1990, 23 y.o.   MRN: [id number]     [facility]      [address] [place], [place] [address]    Phone: [phone] FAX: [phone]     Subjective: Pain is better.  No n/v.  Denies sob, cp, palpitations. Afebrile.  VSS.    Objective:  Vital signs:  Filed Vitals:   05/28/14 0100 05/28/14 0155 05/28/14 0308 05/28/14 0635  BP: 169/89 167/80 158/85 159/77  Pulse: 90 84 86 85  Temp: 98.8 F (37.1 C) 98.2 F (36.8 C) 97.1 F (36.2 C) 98.5 F (36.9 C)  TempSrc:      Resp: '17 16 18 18  ' Height:      Weight:      SpO2: 100% 100%  97%    Last BM Date: 05/26/14  Intake/Output   Yesterday:  10/11 0701 - 10/12 0700 In: 1413 [I.V.:700; Blood:613; IV Piggyback:100] Out: -  This shift: I/O last 3 completed shifts: In: 2695.3 [I.V.:963.3; Blood:1632; IV Piggyback:100] Out: 400 [Urine:400]    Physical Exam: General: Pt awake/alert/oriented x4 in no acute distress Chest: cta. No chest wall pain w good excursion CV:  Pulses intact.  Regular rhythm MS: Normal AROM mjr joints.  No obvious deformity Abdomen: Soft.  Nondistended. Mild ttp rlq, suprapubic region.  No evidence of peritonitis.  No incarcerated hernias. Ext:  SCDs BLE.  No mjr edema.  No cyanosis Skin: No petechiae / purpura   Problem List:   Active Problems:   Acute appendicitis, uncomplicated   Thrombocytopenia    Results:   Labs: Results for orders placed during the hospital encounter of 05/26/14 (from the past 48 hour(s))  COMPREHENSIVE METABOLIC PANEL     Status: Abnormal   Collection Time    05/26/14  2:00 PM      Result Value Ref Range   Sodium 136 (*) 137 - 147 mEq/L   Potassium 3.7  3.7 - 5.3 mEq/L   Chloride 102  96 - 112 mEq/L   CO2 20  19 - 32 mEq/L   Glucose, Bld 117 (*) 70 - 99 mg/dL   BUN 6  6 - 23 mg/dL   Creatinine, Ser 0.36 (*) 0.50 - 1.10 mg/dL   Calcium 9.3  8.4 - 10.5 mg/dL    Total Protein 8.1  6.0 - 8.3 g/dL   Albumin 4.0  3.5 - 5.2 g/dL   AST 17  0 - 37 U/L   ALT 23  0 - 35 U/L   Alkaline Phosphatase 84  39 - 117 U/L   Total Bilirubin 1.0  0.3 - 1.2 mg/dL   GFR calc non Af Amer >90  >90 mL/min   GFR calc Af Amer >90  >90 mL/min   Comment: (NOTE)     The eGFR has been calculated using the CKD EPI equation.     This calculation has not been validated in all clinical situations.     eGFR's persistently <90 mL/min signify possible Chronic Kidney     Disease.   Anion gap 14  5 - 15  URINALYSIS, ROUTINE W REFLEX MICROSCOPIC     Status: Abnormal   Collection Time    05/26/14  5:40 PM      Result Value Ref Range   Color, Urine YELLOW  YELLOW   APPearance CLEAR  CLEAR   Specific Gravity, Urine 1.018  1.005 - 1.030   pH 6.0  5.0 - 8.0   Glucose, UA NEGATIVE  NEGATIVE mg/dL   Hgb urine dipstick NEGATIVE  NEGATIVE   Bilirubin Urine NEGATIVE  NEGATIVE   Ketones, ur 15 (*) NEGATIVE mg/dL   Protein, ur NEGATIVE  NEGATIVE mg/dL   Urobilinogen, UA 1.0  0.0 - 1.0 mg/dL   Nitrite NEGATIVE  NEGATIVE   Leukocytes, UA TRACE (*) NEGATIVE  URINE MICROSCOPIC-ADD ON     Status: None   Collection Time    05/26/14  5:40 PM      Result Value Ref Range   Squamous Epithelial / LPF RARE  RARE   WBC, UA 0-2  <3 WBC/hpf   Urine-Other MUCOUS PRESENT    POC URINE PREG, ED     Status: None   Collection Time    05/26/14  5:56 PM      Result Value Ref Range   Preg Test, Ur NEGATIVE  NEGATIVE   Comment:            THE SENSITIVITY OF THIS     METHODOLOGY IS >24 mIU/mL  WET PREP, GENITAL     Status: Abnormal   Collection Time    05/26/14  6:03 PM      Result Value Ref Range   Yeast Wet Prep HPF POC NONE SEEN  NONE SEEN   Trich, Wet Prep NONE SEEN  NONE SEEN   Clue Cells Wet Prep HPF POC FEW (*) NONE SEEN   WBC, Wet Prep HPF POC MODERATE (*) NONE SEEN  CBC WITH DIFFERENTIAL     Status: Abnormal   Collection Time    05/26/14  6:18 PM      Result Value Ref Range   WBC 19.7  (*) 4.0 - 10.5 K/uL   Comment: REPEATED TO VERIFY     WHITE COUNT CONFIRMED ON SMEAR   RBC 4.65  3.87 - 5.11 MIL/uL   Hemoglobin 11.2 (*) 12.0 - 15.0 g/dL   HCT 34.0 (*) 36.0 - 46.0 %   MCV 73.1 (*) 78.0 - 100.0 fL   MCH 24.1 (*) 26.0 - 34.0 pg   MCHC 32.9  30.0 - 36.0 g/dL   RDW 15.2  11.5 - 15.5 %   Platelets 12 (*) 150 - 400 K/uL   Comment: SPECIMEN CHECKED FOR CLOTS     REPEATED TO VERIFY     PLATELET COUNT CONFIRMED BY SMEAR     CRITICAL RESULT CALLED TO, READ BACK BY AND VERIFIED WITH:     Margarita Sermons [id number] SHIPMAN M   Neutrophils Relative % 86 (*) 43 - 77 %   Lymphocytes Relative 6 (*) 12 - 46 %   Monocytes Relative 8  3 - 12 %   Eosinophils Relative 0  0 - 5 %   Basophils Relative 0  0 - 1 %   Neutro Abs 16.9 (*) 1.7 - 7.7 K/uL   Lymphs Abs 1.2  0.7 - 4.0 K/uL   Monocytes Absolute 1.6 (*) 0.1 - 1.0 K/uL   Eosinophils Absolute 0.0  0.0 - 0.7 K/uL   Basophils Absolute 0.0  0.0 - 0.1 K/uL   WBC Morphology TOXIC GRANULATION     Smear Review LARGE PLATELETS PRESENT    TYPE AND SCREEN     Status: None   Collection Time    05/26/14  8:57 PM      Result Value Ref Range   ABO/RH(D) O POS     Antibody Screen NEG     Sample Expiration 05/29/2014  CBC WITH DIFFERENTIAL     Status: Abnormal   Collection Time    05/26/14  9:07 PM      Result Value Ref Range   WBC 17.1 (*) 4.0 - 10.5 K/uL   Comment: WHITE COUNT CONFIRMED ON SMEAR   RBC 4.40  3.87 - 5.11 MIL/uL   Hemoglobin 10.8 (*) 12.0 - 15.0 g/dL   HCT 33.1 (*) 36.0 - 46.0 %   MCV 75.2 (*) 78.0 - 100.0 fL   MCH 24.5 (*) 26.0 - 34.0 pg   MCHC 32.6  30.0 - 36.0 g/dL   RDW 15.6 (*) 11.5 - 15.5 %   Platelets 31 (*) 150 - 400 K/uL   Comment: PLATELET COUNT CONFIRMED BY SMEAR     SPECIMEN CHECKED FOR CLOTS     REPEATED TO VERIFY     DELTA CHECK NOTED   Neutrophils Relative % 85 (*) 43 - 77 %   Lymphocytes Relative 7 (*) 12 - 46 %   Monocytes Relative 8  3 - 12 %   Eosinophils Relative 0  0 - 5 %   Basophils  Relative 0  0 - 1 %   Neutro Abs 14.5 (*) 1.7 - 7.7 K/uL   Lymphs Abs 1.2  0.7 - 4.0 K/uL   Monocytes Absolute 1.4 (*) 0.1 - 1.0 K/uL   Eosinophils Absolute 0.0  0.0 - 0.7 K/uL   Basophils Absolute 0.0  0.0 - 0.1 K/uL   RBC Morphology POLYCHROMASIA PRESENT     Comment: TEARDROP CELLS   WBC Morphology VACUOLATED NEUTROPHILS     Smear Review PLATELETS APPEAR DECREASED     Comment: LARGE PLATELETS PRESENT  PREPARE PLATELET PHERESIS     Status: None   Collection Time    05/26/14 10:30 PM      Result Value Ref Range   Unit Number [id number]     Blood Component Type PLTPHER LR1     Unit division 00     Status of Unit ISSUED,FINAL     Transfusion Status OK TO TRANSFUSE     Unit Number [id number]     Blood Component Type PLTPHER LRI1     Unit division 00     Status of Unit ISSUED     Transfusion Status OK TO TRANSFUSE     Unit Number [id number]     Blood Component Type PLTPHER LI2     Unit division 00     Status of Unit ISSUED     Transfusion Status OK TO TRANSFUSE    TSH     Status: Abnormal   Collection Time    05/27/14 12:15 AM      Result Value Ref Range   TSH 0.044 (*) 0.350 - 4.500 uIU/mL  T4, FREE     Status: Abnormal   Collection Time    05/27/14 12:15 AM      Result Value Ref Range   Free T4 3.48 (*) 0.80 - 1.80 ng/dL   Comment: Performed at [facility]  DIC (DISSEMINATED INTRAVASCULAR COAGULATION) PANEL     Status: Abnormal   Collection Time    05/27/14 12:15 AM      Result Value Ref Range   Prothrombin Time 15.9 (*) 11.6 - 15.2 seconds   INR 1.27  0.00 - 1.49   aPTT 40 (*) 24 - 37 seconds   Comment:            IF BASELINE aPTT IS ELEVATED,     SUGGEST PATIENT  RISK ASSESSMENT     BE USED TO DETERMINE APPROPRIATE     ANTICOAGULANT THERAPY.   Fibrinogen 451  204 - 475 mg/dL   D-Dimer, Quant 1.14 (*) 0.00 - 0.48 ug/mL-FEU   Comment:            AT THE INHOUSE ESTABLISHED CUTOFF     VALUE OF 0.48 ug/mL FEU,     THIS ASSAY HAS BEEN DOCUMENTED      IN THE LITERATURE TO HAVE     A SENSITIVITY AND NEGATIVE     PREDICTIVE VALUE OF AT LEAST     98 TO 99%.  THE TEST RESULT     SHOULD BE CORRELATED WITH     AN ASSESSMENT OF THE CLINICAL     PROBABILITY OF DVT / VTE.   Platelets 10 (*) 150 - 400 K/uL   Comment: CRITICAL RESULT CALLED TO, READ BACK BY AND VERIFIED WITH:      TO NIRISH(RN) BT TC[place]LAND 05/27/14 AT 1:40AM   Smear Review NO SCHISTOCYTES SEEN    COMPREHENSIVE METABOLIC PANEL     Status: Abnormal   Collection Time    05/27/14  7:20 AM      Result Value Ref Range   Sodium 136 (*) 137 - 147 mEq/L   Potassium 3.5 (*) 3.7 - 5.3 mEq/L   Chloride 101  96 - 112 mEq/L   CO2 23  19 - 32 mEq/L   Glucose, Bld 81  70 - 99 mg/dL   BUN 6  6 - 23 mg/dL   Creatinine, Ser 0.43 (*) 0.50 - 1.10 mg/dL   Calcium 9.3  8.4 - 10.5 mg/dL   Total Protein 7.9  6.0 - 8.3 g/dL   Albumin 3.7  3.5 - 5.2 g/dL   AST 13  0 - 37 U/L   ALT 17  0 - 35 U/L   Alkaline Phosphatase 69  39 - 117 U/L   Total Bilirubin 1.5 (*) 0.3 - 1.2 mg/dL   GFR calc non Af Amer >90  >90 mL/min   GFR calc Af Amer >90  >90 mL/min   Comment: (NOTE)     The eGFR has been calculated using the CKD EPI equation.     This calculation has not been validated in all clinical situations.     eGFR's persistently <90 mL/min signify possible Chronic Kidney     Disease.   Anion gap 12  5 - 15  CBC     Status: Abnormal   Collection Time    05/27/14  7:20 AM      Result Value Ref Range   WBC 10.0  4.0 - 10.5 K/uL   RBC 3.93  3.87 - 5.11 MIL/uL   Hemoglobin 9.3 (*) 12.0 - 15.0 g/dL   HCT 28.8 (*) 36.0 - 46.0 %   MCV 73.3 (*) 78.0 - 100.0 fL   MCH 23.7 (*) 26.0 - 34.0 pg   MCHC 32.3  30.0 - 36.0 g/dL   RDW 15.3  11.5 - 15.5 %   Platelets 16 (*) 150 - 400 K/uL   Comment: CONSISTENT WITH PREVIOUS RESULT     CRITICAL VALUE NOTED.  VALUE IS CONSISTENT WITH PREVIOUSLY REPORTED AND CALLED VALUE.  HIV ANTIBODY (ROUTINE TESTING)     Status: None   Collection Time    05/27/14  9:30 AM       Result Value Ref Range   HIV 1&2 Ab, 4th Generation NONREACTIVE  NONREACTIVE   Comment: (NOTE)  A NONREACTIVE HIV Ag/Ab result does not exclude HIV infection since     the time frame for seroconversion is variable. If acute HIV infection     is suspected, a HIV-1 RNA Qualitative TMA test is recommended.     HIV-1/2 Antibody Diff         Not indicated.     HIV-1 RNA, Qual TMA           Not indicated.     PLEASE NOTE: This information has been disclosed to you from records     whose confidentiality may be protected by state law. If your state     requires such protection, then the state law prohibits you from making     any further disclosure of the information without the specific written     consent of the person to whom it pertains, or as otherwise permitted     by law. A general authorization for the release of medical or other     information is NOT sufficient for this purpose.     The performance of this assay has not been clinically validated in     patients less than 17 years old.     Performed at [facility]     Status: Abnormal   Collection Time    05/27/14  6:26 PM      Result Value Ref Range   Platelets <5 (*) 150 - 400 K/uL   Comment: SPECIMEN CHECKED FOR CLOTS     DELTA CHECK NOTED     REPEATED TO VERIFY     PLATELET COUNT CONFIRMED BY SMEAR     CRITICAL RESULT CALLED TO, READ BACK BY AND VERIFIED WITH:     IRISH NANCY RN AT 1927 ON 05/27/2014 BY INGOLD L  PREPARE PLATELET PHERESIS     Status: None   Collection Time    05/27/14  7:55 PM      Result Value Ref Range   Unit Number [id number]     Blood Component Type PLTPHER LR2     Unit division 00     Status of Unit ISSUED     Transfusion Status OK TO TRANSFUSE     Unit Number [id number]     Blood Component Type PLTPHER LR1     Unit division 00     Status of Unit ISSUED     Transfusion Status OK TO TRANSFUSE    CBC     Status: Abnormal (Preliminary result)   Collection Time     05/28/14  6:10 AM      Result Value Ref Range   WBC 8.9  4.0 - 10.5 K/uL   RBC 4.31  3.87 - 5.11 MIL/uL   Hemoglobin 10.2 (*) 12.0 - 15.0 g/dL   HCT 32.5 (*) 36.0 - 46.0 %   MCV 75.4 (*) 78.0 - 100.0 fL   MCH 23.7 (*) 26.0 - 34.0 pg   MCHC 31.4  30.0 - 36.0 g/dL   RDW 15.3  11.5 - 15.5 %   Platelets PENDING  150 - 400 K/uL  BASIC METABOLIC PANEL     Status: Abnormal   Collection Time    05/28/14  6:10 AM      Result Value Ref Range   Sodium 137  137 - 147 mEq/L   Potassium 3.9  3.7 - 5.3 mEq/L   Chloride 99  96 - 112 mEq/L   CO2 19  19 - 32 mEq/L   Glucose,  Bld 139 (*) 70 - 99 mg/dL   BUN 8  6 - 23 mg/dL   Creatinine, Ser 0.37 (*) 0.50 - 1.10 mg/dL   Calcium 10.0  8.4 - 10.5 mg/dL   GFR calc non Af Amer >90  >90 mL/min   GFR calc Af Amer >90  >90 mL/min   Comment: (NOTE)     The eGFR has been calculated using the CKD EPI equation.     This calculation has not been validated in all clinical situations.     eGFR's persistently <90 mL/min signify possible Chronic Kidney     Disease.   Anion gap 19 (*) 5 - 15  DIC (DISSEMINATED INTRAVASCULAR COAGULATION) PANEL     Status: Abnormal (Preliminary result)   Collection Time    05/28/14  6:10 AM      Result Value Ref Range   Prothrombin Time 14.7  11.6 - 15.2 seconds   INR 1.15  0.00 - 1.49   aPTT 33  24 - 37 seconds   Fibrinogen 644 (*) 204 - 475 mg/dL   D-Dimer, Quant 2.96 (*) 0.00 - 0.48 ug/mL-FEU   Comment:            AT THE INHOUSE ESTABLISHED CUTOFF     VALUE OF 0.48 ug/mL FEU,     THIS ASSAY HAS BEEN DOCUMENTED     IN THE LITERATURE TO HAVE     A SENSITIVITY AND NEGATIVE     PREDICTIVE VALUE OF AT LEAST     98 TO 99%.  THE TEST RESULT     SHOULD BE CORRELATED WITH     AN ASSESSMENT OF THE CLINICAL     PROBABILITY OF DVT / VTE.   Platelets PENDING  150 - 400 K/uL   Smear Review PENDING      Imaging / Studies: Ct Abdomen Pelvis W Contrast  05/26/2014   CLINICAL DATA:  Right lower quadrant pain. Nausea. Initial  evaluation  EXAM: CT ABDOMEN AND PELVIS WITH CONTRAST  TECHNIQUE: Multidetector CT imaging of the abdomen and pelvis was performed using the standard protocol following bolus administration of intravenous contrast.  CONTRAST:  141m OMNIPAQUE IOHEXOL 300 MG/ML  SOLN  COMPARISON:  Ultrasound 01/02/2012.  FINDINGS: Liver normal. Spleen normal. Pancreas normal. No biliary distention. The gallbladder is nondistended.  Adrenals are normal. No focal renal abnormality. No hydronephrosis or obstructing ureteral stone. The bladder is nondistended. Mild endometrial thickening. This may be physiologic. No adnexal mass. Small amount of free pelvic fluid.  Multiple inguinal, iliac, retroperitoneal lymph nodes noted. Inguinal and iliac lymph nodes up to 1.1 cm noted. Retroperitoneal lymph nodes up to 8 mm. Abdominal aorta normal in caliber and widely patent. Visceral vessels patent. Portal vein patent.  Appendix is distended to 1.3 cm. This is consistent with appendicitis. Mild periappendiceal fat planes edema noted. No evidence of bowel obstruction. Stomach is nondistended. No free air. No mesenteric mass lesion. No significant abdominal wall hernia.  Mild subsegmental atelectasis left lung base. Heart size normal. No acute bony abnormality.  IMPRESSION: 1. Appendicitis. 2. Bilateral inguinal/iliac and retroperitoneal prominent lymph nodes. Following resolution of the patient's acute illness, follow-up CT of the abdomen and pelvis to demonstrate resolution of adenopathy suggested .   Electronically Signed   By: [place]  On: 05/26/2014 20:39    Medications / Allergies:  Scheduled Meds: . sodium chloride   Intravenous Once  . antiseptic oral rinse  7 mL Mouth Rinse q12n4p  . cefOXitin  1 g Intravenous 4 times per day  . chlorhexidine  15 mL Mouth Rinse BID  . methylPREDNISolone (SOLU-MEDROL) injection  90 mg Intravenous Q12H  . pantoprazole (PROTONIX) IV  40 mg Intravenous QHS   Continuous Infusions: . 0.9 %  NaCl with KCl 20 mEq / L 100 mL/hr at 05/28/14 0314   PRN Meds:.HYDROmorphone (DILAUDID) injection, ondansetron  Antibiotics: Anti-infectives   Start     Dose/Rate Route Frequency Ordered Stop   05/27/14 0030  cefOXitin (MEFOXIN) 1 g in dextrose 5 % 50 mL IVPB     1 g 100 mL/hr over 30 Minutes Intravenous 4 times per day 05/27/14 0018          Assessment/Plan Acute appendicitis -WBC normal, afebrile, less pain -will continue with non operative management.  If she worsens, will start [place].  Surgery at this point will be the last option and hopefully can avoid given platelet count -continue NPO -IVF -pain control -mobilize  -IS -SCD, no chemical VTE prophylaxis  Severe thrombocytopenia -appreciate Dr. Gearldine Shown assistance -solu-medrol started -repeat labs pending this AM Hyperthyroidism-previously followed by Dr. Loanne Drilling in [place].  Has not seen in 2 years and unable to see due to lack on insurance.  Will ask Endo to see but not sure if they consult in the hospital, will then ask medicine    Erby Pian, [facility] Surgery Pager [phone](7A-4:30P) For consults and floor pages call [phone](7A-4:30P)  05/28/2014

## 2014-05-28 NOTE — Progress Notes (Addendum)
General surgery attending:  I have interviewed and examined this patient this morning. I agree with the assessment treatment plan above.  Her abdominal exam is benign this morning. Because of the potential masking of infection by the high dose steroid, I will switch her to IV Invanz. Allow clear liquid diet. If platelet count goes above 50,000 then consideration will have to be given to whether to continue to treat medically with antibiotics, or to consider appendectomy at this time.  I appreciate Dr. Kalman DrapeSherrill's ongoing management   A/P:  Acute appendicitis. Currently treating medically. Thrombocytopenia. Question ITP. She did have a slight response to Solu-Medrol. Hyperthyroidism. Dr. George HughS.Ellison  has been called.   Angelia MouldHaywood M. Derrell LollingIngram, M.D., [facility] CenterFACS [facility] General and Minimally invasive Surgery Breast and Colorectal Surgery Office:   [phone]

## 2014-05-28 NOTE — Progress Notes (Signed)
INPATIENT PROGRESS NOTE  Subjective:  Patient is seen and examined. Still some right abdominal pain but not worse from prior day. No fever. No bleeding aside from the easy bruising.The platelet count initially  improved after a platelet transfusion with recurrence as of 10/11, below 5000 requiring one more unit of platelets. Today, her platelet count rose to over 20,000 with steroid administration.  Objective: Vital signs in last 24 hours: Blood pressure 159/77, pulse 85, temperature 98.5 F (36.9 C), temperature source Oral, resp. rate 18, height 5\' 9"  (1.753 m), weight 201 lb 4.5 oz (91.3 kg), last menstrual period 05/05/2014, SpO2 97.00%.  Intake/Output from previous day: 10/11 0701 - 10/12 0700 In: 1413 [I.V.:700; Blood:613; IV Piggyback:100] Out: -   Physical Exam:  HEENT: No thrush. Mouth without bleeding. Lungs: Clear bilaterally Cardiac: Regular rate and rhythm Abdomen: No hepatosplenomegaly, tender in the right lower quadrant, no mass Extremities: No leg edema Lymph nodes: "Shotty "cervical and right axillary nodes. No left axillary or inguinal nodes. Skin: Scattered ecchymoses over the extremities. No petechiae    Lab Results:  Recent Labs  05/27/14 0720 05/27/14 1826 05/28/14 0610  WBC 10.0  --  8.9  HGB 9.3*  --  10.2*  HCT 28.8*  --  32.5*  PLT 16* <5* 23*  23*    Blood smear from 05/27/2014-the red cells are microcytic. A few ovalocytes and teardrops. The polychromasia is not increased. Rare helmet cell. No schistocytes. The majority of the white cells are neutrophils. Many 5 lobed and one 6 lobed neutrophil A few atypical mononuclear cells and megakaryocyte nuclei. No blasts. Platelets were markedly decreased in number. Platelets are large. No platelet clumps.  BMET  Recent Labs  05/27/14 0720 05/28/14 0610  NA 136* 137  K 3.5* 3.9  CL 101 99  CO2 23 19  GLUCOSE 81 139*  BUN 6 8  CREATININE 0.43* 0.37*  CALCIUM 9.3 10.0   05/27/2014-HIV  negative  Studies/Results: Ct Abdomen Pelvis W Contrast  05/26/2014   CLINICAL DATA:  Right lower quadrant pain. Nausea. Initial evaluation  EXAM: CT ABDOMEN AND PELVIS WITH CONTRAST  TECHNIQUE: Multidetector CT imaging of the abdomen and pelvis was performed using the standard protocol following bolus administration of intravenous contrast.  CONTRAST:  100mL OMNIPAQUE IOHEXOL 300 MG/ML  SOLN  COMPARISON:  Ultrasound 01/02/2012.  FINDINGS: Liver normal. Spleen normal. Pancreas normal. No biliary distention. The gallbladder is nondistended.  Adrenals are normal. No focal renal abnormality. No hydronephrosis or obstructing ureteral stone. The bladder is nondistended. Mild endometrial thickening. This may be physiologic. No adnexal mass. Small amount of free pelvic fluid.  Multiple inguinal, iliac, retroperitoneal lymph nodes noted. Inguinal and iliac lymph nodes up to 1.1 cm noted. Retroperitoneal lymph nodes up to 8 mm. Abdominal aorta normal in caliber and widely patent. Visceral vessels patent. Portal vein patent.  Appendix is distended to 1.3 cm. This is consistent with appendicitis. Mild periappendiceal fat planes edema noted. No evidence of bowel obstruction. Stomach is nondistended. No free air. No mesenteric mass lesion. No significant abdominal wall hernia.  Mild subsegmental atelectasis left lung base. Heart size normal. No acute bony abnormality.  IMPRESSION: 1. Appendicitis. 2. Bilateral inguinal/iliac and retroperitoneal prominent lymph nodes. Following resolution of the patient's acute illness, follow-up CT of the abdomen and pelvis to demonstrate resolution of adenopathy suggested .   Electronically Signed   By: [name]  Register   On: 05/26/2014 20:39    Medications: Scheduled Meds: . sodium chloride   Intravenous  Once  . antiseptic oral rinse  7 mL Mouth Rinse q12n4p  . cefOXitin  1 g Intravenous 4 times per day  . chlorhexidine  15 mL Mouth Rinse BID  . methylPREDNISolone (SOLU-MEDROL)  injection  90 mg Intravenous Q12H  . pantoprazole (PROTONIX) IV  40 mg Intravenous QHS   Continuous Infusions: . 0.9 % NaCl with KCl 20 mEq / L 100 mL/hr at 05/28/14 0314   PRN Meds:.HYDROmorphone (DILAUDID) injection, ondansetron  Assessment/Plan:  1. 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 Microcytic anemia-chronic  3. right lower quadrant pain and tenderness with CT evidence of acute appendicitis 4. retroperitoneal and pelvic lymphadenopathy on the CT 05/26/2014 Surgery seen patient on 10/11 for appendicitis plans for surgical procedure on hold due to low platelet count.  6. history of Graves' disease Endocrine consult to see for management of hyperthyroidism.  7. history of atrial fibrillation    LOS: 2 days   [facility]avhcs [facility] CampusWERTMAN,SARA E  05/28/2014, 11:11 AM  ADDENDUM: Hematology/Oncology Attending: The patient is seen and examined today. I agree with the above note. As and 23 years old [place] with persistent thrombocytopenia likely ITP in origin in the setting of thyroid problem and the absence of response to the infection treatment. She is feeling fine today with no specific complaints. Her abdominal pain has improved. Her platelets count also improved after starting steroids. I recommended for the patient to continue on Solu-Medrol as scheduled. We will consider switching her to prednisone 1 mg/KG currently on daily basis close to the time of her discharge. Continue to monitor CBC daily. If her platelets count improved over 50,000, the patient may be considered for appendectomy. The patient has significant history of hyperthyroidism and has not seen an endocrinologist for the last 2  years secondary to lack of insurance. It may be a good idea to consult endocrinology to see the patient while she is in the hospital.

## 2014-05-28 NOTE — Telephone Encounter (Signed)
See below, Amina NP called right before lunch CB is [phone]

## 2014-05-28 NOTE — Telephone Encounter (Signed)
Please call Kellie Shaffer at her cell # to consult regarding this pt. In the hospital now

## 2014-05-28 NOTE — Telephone Encounter (Signed)
i called.  i left message to please call back.

## 2014-05-29 LAB — PREPARE PLATELET PHERESIS
UNIT DIVISION: 0
UNIT DIVISION: 0

## 2014-05-29 LAB — CBC
HCT: 31.4 % — ABNORMAL LOW (ref 36.0–46.0)
HEMOGLOBIN: 10.1 g/dL — AB (ref 12.0–15.0)
MCH: 23.7 pg — ABNORMAL LOW (ref 26.0–34.0)
MCHC: 32.2 g/dL (ref 30.0–36.0)
MCV: 73.5 fL — ABNORMAL LOW (ref 78.0–100.0)
Platelets: 7 10*3/uL — CL (ref 150–400)
RBC: 4.27 MIL/uL (ref 3.87–5.11)
RDW: 15.1 % (ref 11.5–15.5)
WBC: 11.3 10*3/uL — ABNORMAL HIGH (ref 4.0–10.5)

## 2014-05-29 MED ORDER — SODIUM CHLORIDE 0.9 % IV SOLN
Freq: Once | INTRAVENOUS | Status: AC
  Administered 2014-05-29: 12:00:00 via INTRAVENOUS

## 2014-05-29 NOTE — Progress Notes (Signed)
[facility]/Uziel Covault Progress Note    Subjective: Patient is asymptomatic.  No more pain  Objective: Vital signs in last 24 hours: Temp:  [97.7 F (36.5 C)-98.2 F (36.8 C)] 98 F (36.7 C) (10/13 0550) Pulse Rate:  [70-79] 70 (10/13 0550) Resp:  [17-18] 18 (10/13 0550) BP: (146-154)/(64-83) 152/64 mmHg (10/13 0550) SpO2:  [37 %-100 %] 98 % (10/13 0550) Last BM Date: 05/28/14  Intake/Output from previous day: 10/12 0701 - 10/13 0700 In: 3709.3 [P.O.:1486; I.V.:2223.3] Out: -  Intake/Output this shift:    General: No acute distress  Lungs: Clear  Abd: Soft, nontender, good bowel sounds.  Extremities: Intact  Neuro: Intact  Lab Results:  @LABLAST2 (wbc:2,hgb:2,hct:2,plt:2) BMET  Recent Labs  05/27/14 0720 05/28/14 0610  NA 136* 137  K 3.5* 3.9  CL 101 99  CO2 23 19  GLUCOSE 81 139*  BUN 6 8  CREATININE 0.43* 0.37*  CALCIUM 9.3 10.0   PT/INR  Recent Labs  05/27/14 0015 05/28/14 0610  LABPROT 15.9* 14.7  INR 1.27 1.15   ABG No results found for this basename: PHART, PCO2, PO2, HCO3,  in the last 72 hours  Studies/Results: No results found.  Anti-infectives: Anti-infectives   Start     Dose/Rate Route Frequency Ordered Stop   05/28/14 1300  ertapenem (INVANZ) 1 g in sodium chloride 0.9 % 50 mL IVPB     1 g 100 mL/hr over 30 Minutes Intravenous Every 24 hours 05/28/14 1121     05/27/14 0030  cefOXitin (MEFOXIN) 1 g in dextrose 5 % 50 mL IVPB  Status:  Discontinued     1 g 100 mL/hr over 30 Minutes Intravenous 4 times per day 05/27/14 0018 05/28/14 1121      Assessment/Plan: s/p  Platelet count down to 7K this AM Will await the advice of hematology about giving more platelets, but patient is doing well from appendicitis standpoint is concerned.  No need for surgery.  LOS: 3 days   Marta LamasJames O. Gae BonWyatt, III, MD, FACS [phone]--pager [phone]--office [facility] 05/29/2014

## 2014-05-30 LAB — CBC
HEMATOCRIT: 32.2 % — AB (ref 36.0–46.0)
HEMOGLOBIN: 10.2 g/dL — AB (ref 12.0–15.0)
MCH: 23.7 pg — ABNORMAL LOW (ref 26.0–34.0)
MCHC: 31.7 g/dL (ref 30.0–36.0)
MCV: 74.9 fL — ABNORMAL LOW (ref 78.0–100.0)
Platelets: 29 10*3/uL — CL (ref 150–400)
RBC: 4.3 MIL/uL (ref 3.87–5.11)
RDW: 15.2 % (ref 11.5–15.5)
WBC: 11.6 10*3/uL — AB (ref 4.0–10.5)

## 2014-05-30 LAB — PREPARE PLATELET PHERESIS
UNIT DIVISION: 0
Unit division: 0

## 2014-05-30 MED ORDER — BACID PO TABS
2.0000 | ORAL_TABLET | Freq: Three times a day (TID) | ORAL | Status: DC
  Administered 2014-05-30 – 2014-05-31 (×4): 2 via ORAL
  Filled 2014-05-30 (×7): qty 2

## 2014-05-30 MED ORDER — METHIMAZOLE 10 MG PO TABS
10.0000 mg | ORAL_TABLET | Freq: Two times a day (BID) | ORAL | Status: DC
  Administered 2014-05-30 – 2014-05-31 (×3): 10 mg via ORAL
  Filled 2014-05-30 (×4): qty 1

## 2014-05-30 MED ORDER — IMMUNE GLOBULIN (HUMAN) 10 GM/100ML IV SOLN
2.0000 g/kg | INTRAVENOUS | Status: DC
  Administered 2014-05-31: 185 g via INTRAVENOUS
  Filled 2014-05-30 (×3): qty 1850

## 2014-05-30 NOTE — Progress Notes (Signed)
Patient ID: Kellie Shaffer, female   DOB: May 05, 1991, 23 y.o.   MRN: [id number]  Spoke with Dr. Everardo AllEllison who recommends starting MMA 10mg  PO BID.  Will have her follow up with Dr. Everardo AllEllison in 1-2 weeks.  The patient has already reached out to his office for a follow up.  We will provide her with Rx for about 2 weeks and further will need to come from Dr. Everardo AllEllison as we do not Rx this medication and will not be following her for this. Medication risks, benefits and therapeutic alternatives were reviewed with the patient.  She verbalizes understanding.    I have called Dr. Gwenyth BouillonMohammad and left a message with his nurse regarding further intervention and follow up for thrombocytopenia.  Awaiting a callback.   [name], ANP-BC

## 2014-05-30 NOTE — Care Management Note (Signed)
  Page 1 of 1   05/30/2014     3:12:27 PM CARE MANAGEMENT NOTE 05/30/2014  Patient:  Kellie Shaffer,Kellie Shaffer   Account Number:  [id number]  Date Initiated:  05/30/2014  Documentation initiated by:  Ronny FlurryWILE,Chandi Nicklin  Subjective/Objective Assessment:     Action/Plan:   Anticipated DC Date:     Anticipated DC Plan:  HOME/SELF CARE      DC Planning Services  CM consult  MATCH Program  [facility]      Choice offered to / List presented to:             Status of service:   Medicare Important Message given?   (If response is "NO", the following Medicare IM given date fields will be blank) Date Medicare IM given:   Medicare IM given by:   Date Additional Medicare IM given:   Additional Medicare IM given by:    Discharge Disposition:    Per UR Regulation:    If discussed at Long Length of Stay Meetings, dates discussed:    Comments:  05-30-14 Referral for assistance for Methimazole. MATCH letter given and explained . [facility] given . Unable to find medication assitance program through [employer] for Methimazole. Ronny FlurryHeather Genesys Coggeshall RN BSN

## 2014-05-30 NOTE — Progress Notes (Addendum)
  Subjective: Stable and alert. Denies pain. No bleeding. Tolerating liquid diet but has diarrhea. C. Difficile PCR negative.  Platelet count back up to 29,000 this morning. Hemoglobin 10.2, stable. WBC 11,600. D-dimer elevated at 2.96. Fibrinogen elevated at 644. PT/PTT normal.  Objective: Vital signs in last 24 hours: Temp:  [98.3 F (36.8 C)-98.6 F (37 C)] 98.4 F (36.9 C) (10/13 2106) Pulse Rate:  [55-61] 58 (10/13 2106) Resp:  [17-18] 17 (10/13 2106) BP: (149-159)/(70-92) 158/76 mmHg (10/13 2106) SpO2:  [98 %-100 %] 100 % (10/13 2106) Last BM Date: 05/29/14  Intake/Output from previous day: 10/13 0701 - 10/14 0700 In: 3198 [P.O.:1440; I.V.:1758] Out: -  Intake/Output this shift: Total I/O In: 2118 [P.O.:360; I.V.:1758] Out: -    GI abdomen soft. Nontender. No mass. Benign exam. Gen:  Alert. Cooperative. No distress whatsoever. Mental status normal.  Lab Results:   Recent Labs  05/29/14 0503 05/30/14 0500  WBC 11.3* 11.6*  HGB 10.1* 10.2*  HCT 31.4* 32.2*  PLT 7* 29*   BMET  Recent Labs  05/27/14 0720 05/28/14 0610  NA 136* 137  K 3.5* 3.9  CL 101 99  CO2 23 19  GLUCOSE 81 139*  BUN 6 8  CREATININE 0.43* 0.37*  CALCIUM 9.3 10.0   PT/INR  Recent Labs  05/28/14 0610  LABPROT 14.7  INR 1.15   ABG No results found for this basename: PHART, PCO2, PO2, HCO3,  in the last 72 hours  Studies/Results: No results found.  Anti-infectives: Anti-infectives   Start     Dose/Rate Route Frequency Ordered Stop   05/28/14 1300  ertapenem (INVANZ) 1 g in sodium chloride 0.9 % 50 mL IVPB     1 g 100 mL/hr over 30 Minutes Intravenous Every 24 hours 05/28/14 1121     05/27/14 0030  cefOXitin (MEFOXIN) 1 g in dextrose 5 % 50 mL IVPB  Status:  Discontinued     1 g 100 mL/hr over 30 Minutes Intravenous 4 times per day 05/27/14 0018 05/28/14 1121      Assessment/Plan:  Acute appendicitis, apparently uncomplicated by CT. Asymptomatic on IV  antibiotics Because of her significant thrombocytopenia, would opt for treatment with primary antibiotic therapy for 10 days and avoid surgery at this time. Follow as outpatient.  consider interval appendectomy, depending on clinical course and risk.  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 On Solu-Medrol now  No evidence of bleeding. Will discuss short and long-term management plans with hematology prior to discharge  Apparent hyperthyroidism. Not interfering with  Management of acute problems. Will discuss management plan with Dr. Everardo AllEllison prior to discharge.   LOS: 4 days    Kellie Shaffer M 05/30/2014

## 2014-05-31 ENCOUNTER — Telehealth: Payer: Self-pay | Admitting: Internal Medicine

## 2014-05-31 ENCOUNTER — Encounter (HOSPITAL_COMMUNITY): Payer: Self-pay | Admitting: *Deleted

## 2014-05-31 DIAGNOSIS — K37 Unspecified appendicitis: Secondary | ICD-10-CM

## 2014-05-31 DIAGNOSIS — E0590 Thyrotoxicosis, unspecified without thyrotoxic crisis or storm: Secondary | ICD-10-CM

## 2014-05-31 LAB — CBC
HCT: 37.3 % (ref 36.0–46.0)
Hemoglobin: 11.7 g/dL — ABNORMAL LOW (ref 12.0–15.0)
MCH: 23.3 pg — ABNORMAL LOW (ref 26.0–34.0)
MCHC: 31.4 g/dL (ref 30.0–36.0)
MCV: 74.2 fL — ABNORMAL LOW (ref 78.0–100.0)
Platelets: 60 10*3/uL — ABNORMAL LOW (ref 150–400)
RBC: 5.03 MIL/uL (ref 3.87–5.11)
RDW: 14.9 % (ref 11.5–15.5)
WBC: 8.9 10*3/uL (ref 4.0–10.5)

## 2014-05-31 MED ORDER — PANTOPRAZOLE SODIUM 40 MG PO TBEC
40.0000 mg | DELAYED_RELEASE_TABLET | Freq: Every day | ORAL | Status: DC
  Administered 2014-05-31: 40 mg via ORAL
  Filled 2014-05-31: qty 1

## 2014-05-31 MED ORDER — METRONIDAZOLE 500 MG PO TABS: 500.0000 mg | ORAL_TABLET | Freq: Three times a day (TID) | ORAL | Status: DC

## 2014-05-31 MED ORDER — PREDNISONE 20 MG PO TABS: 90.0000 mg | ORAL_TABLET | Freq: Every day | ORAL | Status: DC

## 2014-05-31 MED ORDER — HYDROCODONE-ACETAMINOPHEN 5-325 MG PO TABS: 1.0000 | ORAL_TABLET | ORAL | Status: DC | PRN

## 2014-05-31 MED ORDER — CIPROFLOXACIN HCL 500 MG PO TABS
500.0000 mg | ORAL_TABLET | Freq: Two times a day (BID) | ORAL | Status: DC
  Administered 2014-05-31: 500 mg via ORAL
  Filled 2014-05-31: qty 1

## 2014-05-31 MED ORDER — CIPROFLOXACIN HCL 500 MG PO TABS
500.0000 mg | ORAL_TABLET | Freq: Two times a day (BID) | ORAL | Status: DC
Start: 2014-05-31 — End: 2014-06-07

## 2014-05-31 MED ORDER — HYDROCODONE-ACETAMINOPHEN 5-325 MG PO TABS: 1.0000 | ORAL_TABLET | Freq: Four times a day (QID) | ORAL | Status: DC | PRN

## 2014-05-31 MED ORDER — METHIMAZOLE 10 MG PO TABS: 10.0000 mg | ORAL_TABLET | Freq: Two times a day (BID) | ORAL | Status: DC

## 2014-05-31 NOTE — Discharge Summary (Signed)
Physician Discharge Summary  Kellie Shaffer ZOX:[id number] DOB: Sep 11, 1990 DOA: 05/26/2014  PCP: Default, Provider, MD  Consultation: Dr. Mohamed---Hematology    Dr. Derry LoryEllison---telephone consult only  Admit date: 05/26/2014 Discharge date: 05/31/2014  Recommendations for Outpatient Follow-up:   Follow-up Information   Follow up with Romero BellingELLISON, SEAN, MD. Schedule an appointment as soon as possible for a visit in 1 week.   Specialty:  Endocrinology   Contact information:   [address] [place] [address] [phone]       Follow up with Lajuana MatteMOHAMED,MOHAMED K., MD In 1 week.   Specialty:  Oncology   Contact information:   [address] [place] [address] [phone]       Follow up with [name] [name],HAYWOOD M, MD. Schedule an appointment as soon as possible for a visit in 6 weeks.   Specialty:  General Surgery   Contact information:   [address] [place] [address] [phone]      Discharge Diagnoses:  1. Acute appendicitis 2. Profound thrombocytopenia 3. Hyperthyroidism   Surgical Procedure: none  Discharge Condition: stable Disposition: home  Diet recommendation: soft  Filed Weights   05/27/14 0036  Weight: 201 lb 4.5 oz (91.3 kg)    Filed Vitals:   05/31/14 0458  BP: 158/80  Pulse: 54  Temp: 98.2 F (36.8 C)  Resp: 17      Hospital Course:  Ewing SchleinMarkysha [facility].  Her work up showed acute appendicitis.  She was found to have a platelet count of 12K.  Dr. Arbutus PedMohamed was consulted for management.  She was admitted and started on IV antibiotics and bowel rest.  She was transfused with platelets and started on IV steroids.  Her platelet count improved and she did not exhibit signs of bleeding.  She was started on IV IG and given one dose.  On day of discharge, her PLT count was 60K.  Dr. Arbutus PedMohamed recommended discharge with prednisone 1mg /kg and close follow up in his office.  She  was given an Rx for 2 weeks.  Medication risks, benefits and therapeutic alternatives were reviewed with the patient.  She verbalizes understanding.    She clinically improved from appendicitis and antibiotics were switched to PO.  She will follow up with Dr. Derrell LollingIngram in 6 weeks to discuss the option of an appendectomy. Remained afebrile.  WBC normalized. Diet was advanced which she tolerated.  Pain resolved.  We discussed warning signs that warrant immediate attention.  She understands that prednisone may mask signs. The patient has a known history of graves disease previously followed by Dr. Everardo AllEllison but lost follow up due to lack health insurance.  She was found to be hypertensive, has a history of AF, PVCs on telemetry.  I discussed with Dr. Everardo AllEllison who recommended starting MMA 10mg  BID and close follow up in his office.  Medication risks, benefits and therapeutic alternatives were reviewed with the patient.  She verbalizes understanding.  Case management was consulted to help with the cost of medication.   On HD#5 the patient was felt stable for discharge.  We reviewed her follow ups.  She was encouraged to call with questions or concerns.      Discharge Instructions     Medication List         ciprofloxacin 500 MG tablet  Commonly known as:  CIPRO  Take 1 tablet (500 mg total) by mouth 2 (two) times daily.  HYDROcodone-acetaminophen 5-325 MG per tablet  Commonly known as:  NORCO/VICODIN  Take 1 tablet by mouth every 6 (six) hours as needed for moderate pain.     methimazole 10 MG tablet  Commonly known as:  TAPAZOLE  Take 1 tablet (10 mg total) by mouth 2 (two) times daily.     metroNIDAZOLE 500 MG tablet  Commonly known as:  FLAGYL  Take 1 tablet (500 mg total) by mouth every 8 (eight) hours.     predniSONE 20 MG tablet  Commonly known as:  DELTASONE  Take 4.5 tablets (90 mg total) by mouth daily with breakfast.     traMADol 50 MG tablet  Commonly known as:  ULTRAM  Take  50 mg by mouth every 6 (six) hours as needed for moderate pain.           Follow-up Information   Follow up with Romero Belling, MD. Schedule an appointment as soon as possible for a visit in 1 week.   Specialty:  Endocrinology   Contact information:   [address] [place] [address] [phone]       Follow up with Lajuana Matte., MD In 1 week.   Specialty:  Oncology   Contact information:   [address] [place] [address] [phone]       Follow up with [name] Mention, MD. Schedule an appointment as soon as possible for a visit in 6 weeks.   Specialty:  General Surgery   Contact information:   [address] [place] [address] [phone]        The results of significant diagnostics from this hospitalization (including imaging, microbiology, ancillary and laboratory) are listed below for reference.    Significant Diagnostic Studies: Ct Abdomen Pelvis W Contrast  05/26/2014   CLINICAL DATA:  Right lower quadrant pain. Nausea. Initial evaluation  EXAM: CT ABDOMEN AND PELVIS WITH CONTRAST  TECHNIQUE: Multidetector CT imaging of the abdomen and pelvis was performed using the standard protocol following bolus administration of intravenous contrast.  CONTRAST:  OMNIPAQUE IOHEXOL 300 MG/ML  SOLN  COMPARISON:  Ultrasound 01/02/2012.  FINDINGS: Liver normal. Spleen normal. Pancreas normal. No biliary distention. The gallbladder is nondistended.  Adrenals are normal. No focal renal abnormality. No hydronephrosis or obstructing ureteral stone. The bladder is nondistended. Mild endometrial thickening. This may be physiologic. No adnexal mass. Small amount of free pelvic fluid.  Multiple inguinal, iliac, retroperitoneal lymph nodes noted. Inguinal and iliac lymph nodes up to 1.1 cm noted. Retroperitoneal lymph nodes up to 8 mm. Abdominal aorta normal in caliber and widely patent. Visceral vessels patent. Portal vein patent.  Appendix  is distended to 1.3 cm. This is consistent with appendicitis. Mild periappendiceal fat planes edema noted. No evidence of bowel obstruction. Stomach is nondistended. No free air. No mesenteric mass lesion. No significant abdominal wall hernia.  Mild subsegmental atelectasis left lung base. Heart size normal. No acute bony abnormality.  IMPRESSION: 1. Appendicitis. 2. Bilateral inguinal/iliac and retroperitoneal prominent lymph nodes. Following resolution of the patient's acute illness, follow-up CT of the abdomen and pelvis to demonstrate resolution of adenopathy suggested .   Electronically Signed   By: Maisie Fus  Register   On: 05/26/2014 20:39    Microbiology: Recent Results (from the past 240 hour(s))  WET PREP, GENITAL     Status: Abnormal   Collection Time    05/26/14  6:03 PM      Result Value Ref Range Status  Yeast Wet Prep HPF POC NONE SEEN  NONE SEEN Final   Trich, Wet Prep NONE SEEN  NONE SEEN Final   Clue Cells Wet Prep HPF POC FEW (*) NONE SEEN Final   WBC, Wet Prep HPF POC MODERATE (*) NONE SEEN Final  GC/CHLAMYDIA PROBE AMP     Status: None   Collection Time    05/26/14  6:12 PM      Result Value Ref Range Status   CT Probe RNA NEGATIVE  NEGATIVE Final   GC Probe RNA NEGATIVE  NEGATIVE Final   Comment: (NOTE)                                                                                               **Normal Reference Range: Negative**          Assay performed using the Gen-Probe APTIMA COMBO2 (R) Assay.     Acceptable specimen types for this assay include APTIMA Swabs (Unisex,     endocervical, urethral, or vaginal), first void urine, and ThinPrep     liquid based cytology samples.     Performed at [facility]  CLOSTRIDIUM DIFFICILE BY PCR     Status: None   Collection Time    05/28/14  2:48 PM      Result Value Ref Range Status   C difficile by pcr NEGATIVE  NEGATIVE Final     Labs: Basic Metabolic Panel:  Recent Labs Lab 05/26/14 1400 05/27/14 0720  05/28/14 0610  NA 136* 136* 137  K 3.7 3.5* 3.9  CL 102 101 99  CO2 20 23 19   GLUCOSE 117* 81 139*  BUN 6 6 8   CREATININE 0.36* 0.43* 0.37*  CALCIUM 9.3 9.3 10.0   Liver Function Tests:  Recent Labs Lab 05/26/14 1400 05/27/14 0720  AST 17 13  ALT 23 17  ALKPHOS 84 69  BILITOT 1.0 1.5*  PROT 8.1 7.9  ALBUMIN 4.0 3.7   No results found for this basename: LIPASE, AMYLASE,  in the last 168 hours No results found for this basename: AMMONIA,  in the last 168 hours CBC:  Recent Labs Lab 05/26/14 1818 05/26/14 2107  05/27/14 0720 05/27/14 1826 05/28/14 0610 05/29/14 0503 05/30/14 0500 05/31/14 0452  WBC 19.7* 17.1*  --  10.0  --  8.9 11.3* 11.6* 8.9  NEUTROABS 16.9* 14.5*  --   --   --   --   --   --   --   HGB 11.2* 10.8*  --  9.3*  --  10.2* 10.1* 10.2* 11.7*  HCT 34.0* 33.1*  --  28.8*  --  32.5* 31.4* 32.2* 37.3  MCV 73.1* 75.2*  --  73.3*  --  75.4* 73.5* 74.9* 74.2*  PLT 12* 31*  < > 16* <5* 23*  23* 7* 29* 60*  < > = values in this interval not displayed. Cardiac Enzymes: No results found for this basename: CKTOTAL, CKMB, CKMBINDEX, TROPONINI,  in the last 168 hours BNP: BNP (last 3 results) No results found for this basename: PROBNP,  in the last 8760 hours CBG: No results found for  this basename: GLUCAP,  in the last 168 hours  Active Problems:   Acute appendicitis, uncomplicated   Thrombocytopenia   Time coordinating discharge: <30 mins  Signed:  Kalai Baca, ANP-BC

## 2014-05-31 NOTE — Discharge Instructions (Signed)
Steroids for low platelets -i have provided for 1 week.  Please make sure you follow up with Dr. Arbutus PedMohamed within this time for further prescriptions.  Hyperthyroidism -I have provided for 2 weeks of Tapazole.  Please follow up with Dr. Everardo AllEllison within this time for further prescriptions.  Appendicitis -please schedule a follow up with Dr. Derrell LollingIngram in 6 weeks to discuss whether to remove your appendix or not. -if you develop worsening right abdominal pain, fever, chills, sweats, poor appetite please call or come to the ED. -because you are on prednisone it may mask some of your symptoms so when in doubt please seek medical attention.

## 2014-05-31 NOTE — Progress Notes (Signed)
Subjective: The patient is seen and examined today. She is feeling fine with no specific complaints. She denied having any significant abdominal pain. She has no bleeding issues. She started the first dose of IVIG yesterday.  Objective: Vital signs in last 24 hours: Temp:  [97.9 F (36.6 C)-99 F (37.2 C)] 98.2 F (36.8 C) (10/15 0458) Pulse Rate:  [54-77] 54 (10/15 0458) Resp:  [16-18] 17 (10/15 0458) BP: (138-160)/(68-83) 158/80 mmHg (10/15 0458) SpO2:  [98 %-100 %] 100 % (10/15 0458)  Intake/Output from previous day: 10/14 0701 - 10/15 0700 In: 1245 [P.O.:240; I.V.:1005] Out: -  Intake/Output this shift:    General appearance: alert, cooperative and no distress Resp: clear to auscultation bilaterally Cardio: regular rate and rhythm, S1, S2 normal, no murmur, click, rub or gallop GI: soft, non-tender; bowel sounds normal; no masses,  no organomegaly Extremities: extremities normal, atraumatic, no cyanosis or edema  Lab Results:   Recent Labs  05/30/14 0500 05/31/14 0452  WBC 11.6* 8.9  HGB 10.2* 11.7*  HCT 32.2* 37.3  PLT 29* 60*   BMET No results found for this basename: NA, K, CL, CO2, GLUCOSE, BUN, CREATININE, CALCIUM,  in the last 72 hours  Studies/Results: No results found.  Medications: I have reviewed the patient's current medications.  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 She has significant improvement in her platelets count overnight which is currently up to 60,000. She also has improvement in her hemoglobin and hematocrit. She would like to go home today. I recommended for the patient to continue on steroids in the form of prednisone 1 mg/KG daily until she has reevaluation at the cancer Center again within the next 7-10 days. It is okay to start the second dose of IVIG which is is scheduled for later tonight. 2) appendicitis: Treated  medically for now. Followed by surgery. 3) hyperyroidism: She will need outpatient followup visit with Dr. Everardo AllEllison.  LOS: 5 days    Swayze Kozuch K. 05/31/2014

## 2014-05-31 NOTE — Progress Notes (Signed)
  Subjective: Tolerating regular diet. Bowel movements more solid now. On probiotic. Wants to go home. Denies abdominal pain. Denies chills. Remains on immunoglobulin and IV steroids for presumed ITP-like illness. Await final recommendations from hematology. Platelet count 60,000 this morning. Hemoglobin 11.7. WBC is 8900.  Now on Tapazole for hyperthyroidism. Dr. Everardo AllEllison has offered to followup with the patient in his office in one to 2 weeks.  Objective: Vital signs in last 24 hours: Temp:  [97.9 F (36.6 C)-99 F (37.2 C)] 98.2 F (36.8 C) (10/15 0458) Pulse Rate:  [54-77] 54 (10/15 0458) Resp:  [16-18] 17 (10/15 0458) BP: (138-165)/(68-88) 158/80 mmHg (10/15 0458) SpO2:  [98 %-100 %] 100 % (10/15 0458) Last BM Date: 05/30/14  Intake/Output from previous day: 10/14 0701 - 10/15 0700 In: 1245 [P.O.:240; I.V.:1005] Out: -  Intake/Output this shift: Total I/O In: 605 [I.V.:605] Out: -   General appearance: alert. No distress. Mental status normal.   GI: soft. Nontender. No mass. Nondistended.  Lab Results:   Recent Labs  05/30/14 0500 05/31/14 0452  WBC 11.6* 8.9  HGB 10.2* 11.7*  HCT 32.2* 37.3  PLT 29* 60*   BMET No results found for this basename: NA, K, CL, CO2, GLUCOSE, BUN, CREATININE, CALCIUM,  in the last 72 hours PT/INR No results found for this basename: LABPROT, INR,  in the last 72 hours ABG No results found for this basename: PHART, PCO2, PO2, HCO3,  in the last 72 hours  Studies/Results: No results found.  Anti-infectives: Anti-infectives   Start     Dose/Rate Route Frequency Ordered Stop   05/31/14 0800  ciprofloxacin (CIPRO) tablet 500 mg     500 mg Oral 2 times daily 05/31/14 0621     05/31/14 0630  metroNIDAZOLE (FLAGYL) tablet 500 mg     500 mg Oral 3 times per day 05/31/14 0621     05/28/14 1300  ertapenem (INVANZ) 1 g in sodium chloride 0.9 % 50 mL IVPB  Status:  Discontinued     1 g 100 mL/hr over 30 Minutes Intravenous Every 24  hours 05/28/14 1121 05/31/14 0621   05/27/14 0030  cefOXitin (MEFOXIN) 1 g in dextrose 5 % 50 mL IVPB  Status:  Discontinued     1 g 100 mL/hr over 30 Minutes Intravenous 4 times per day 05/27/14 0018 05/28/14 1121      Assessment/Plan:   Acute appendicitis, apparently uncomplicated by CT. Asymptomatic on IV antibiotics  Because of her significant thrombocytopenia, would opt for treatment with primary antibiotic therapy for 10 days and avoid surgery at this time.  Switch to oral Cipro and Flagyl this morning. 7 more days of oral antibiotics. Follow as outpatient. I offered to see her in 4-6 weeks. consider interval appendectomy, depending on clinical course and risk.   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  On Solu-Medrol and immune globulin - responding No evidence of bleeding.  Will discuss short and long-term management plans with hematology prior to discharge   Apparent hyperthyroidism.  Not interfering with Management of acute problems.  Tapazole 10 mg by mouth twice a day Followup with Dr. Romero BellingSean Ellison  in one to 2 weeks     LOS: 5 days    Kellie Shaffer 05/31/2014

## 2014-05-31 NOTE — Progress Notes (Signed)
Discharge paperwork given to patient. No questions verbalize at this time.

## 2014-05-31 NOTE — Discharge Summary (Signed)
I agree with the discharge summary, discharge diagnoses, discharge plans and followup plans.  [name] [name],[name]

## 2014-05-31 NOTE — Telephone Encounter (Signed)
C/D 05/31/14 for appt. 06/07/14 °

## 2014-05-31 NOTE — Telephone Encounter (Signed)
S/W PATIENT AND GAVE HOSP F/U APPT FOR 10/22 @ 11:30 W/DR. MOHAMED.

## 2014-06-07 ENCOUNTER — Telehealth: Payer: Self-pay | Admitting: Internal Medicine

## 2014-06-07 ENCOUNTER — Other Ambulatory Visit (HOSPITAL_BASED_OUTPATIENT_CLINIC_OR_DEPARTMENT_OTHER): Payer: Self-pay

## 2014-06-07 ENCOUNTER — Ambulatory Visit (INDEPENDENT_AMBULATORY_CARE_PROVIDER_SITE_OTHER): Payer: Self-pay | Admitting: Endocrinology

## 2014-06-07 ENCOUNTER — Other Ambulatory Visit: Payer: Self-pay | Admitting: Internal Medicine

## 2014-06-07 ENCOUNTER — Encounter: Payer: Self-pay | Admitting: Internal Medicine

## 2014-06-07 ENCOUNTER — Ambulatory Visit: Payer: Self-pay

## 2014-06-07 ENCOUNTER — Ambulatory Visit (HOSPITAL_BASED_OUTPATIENT_CLINIC_OR_DEPARTMENT_OTHER): Payer: Self-pay | Admitting: Internal Medicine

## 2014-06-07 ENCOUNTER — Other Ambulatory Visit: Payer: Self-pay

## 2014-06-07 ENCOUNTER — Encounter: Payer: Self-pay | Admitting: Endocrinology

## 2014-06-07 VITALS — BP 136/84 | HR 76 | Temp 98.7°F | Ht 69.0 in | Wt 203.0 lb

## 2014-06-07 VITALS — BP 150/94 | HR 74 | Temp 98.3°F | Resp 18 | Ht 69.0 in | Wt 201.5 lb

## 2014-06-07 DIAGNOSIS — E0590 Thyrotoxicosis, unspecified without thyrotoxic crisis or storm: Secondary | ICD-10-CM

## 2014-06-07 DIAGNOSIS — D693 Immune thrombocytopenic purpura: Secondary | ICD-10-CM

## 2014-06-07 DIAGNOSIS — D696 Thrombocytopenia, unspecified: Secondary | ICD-10-CM

## 2014-06-07 LAB — CBC WITH DIFFERENTIAL/PLATELET
BASO%: 0.4 % (ref 0.0–2.0)
BASOS ABS: 0 10*3/uL (ref 0.0–0.1)
EOS ABS: 0 10*3/uL (ref 0.0–0.5)
EOS%: 0.3 % (ref 0.0–7.0)
HCT: 35.5 % (ref 34.8–46.6)
HEMOGLOBIN: 11 g/dL — AB (ref 11.6–15.9)
LYMPH%: 20.7 % (ref 14.0–49.7)
MCH: 22.8 pg — ABNORMAL LOW (ref 25.1–34.0)
MCHC: 31 g/dL — ABNORMAL LOW (ref 31.5–36.0)
MCV: 73.4 fL — AB (ref 79.5–101.0)
MONO#: 1 10*3/uL — AB (ref 0.1–0.9)
MONO%: 9 % (ref 0.0–14.0)
NEUT%: 69.6 % (ref 38.4–76.8)
NEUTROS ABS: 8 10*3/uL — AB (ref 1.5–6.5)
PLATELETS: 264 10*3/uL (ref 145–400)
RBC: 4.83 10*6/uL (ref 3.70–5.45)
RDW: 16.5 % — ABNORMAL HIGH (ref 11.2–14.5)
WBC: 11.5 10*3/uL — AB (ref 3.9–10.3)
lymph#: 2.4 10*3/uL (ref 0.9–3.3)

## 2014-06-07 LAB — TSH: TSH: 0.04 u[IU]/mL — ABNORMAL LOW (ref 0.35–4.50)

## 2014-06-07 LAB — COMPREHENSIVE METABOLIC PANEL (CC13)
ALBUMIN: 3.4 g/dL — AB (ref 3.5–5.0)
ALT: 177 U/L — ABNORMAL HIGH (ref 0–55)
ANION GAP: 5 meq/L (ref 3–11)
AST: 87 U/L — ABNORMAL HIGH (ref 5–34)
Alkaline Phosphatase: 80 U/L (ref 40–150)
BUN: 12.1 mg/dL (ref 7.0–26.0)
CHLORIDE: 108 meq/L (ref 98–109)
CO2: 24 meq/L (ref 22–29)
Calcium: 9.4 mg/dL (ref 8.4–10.4)
Creatinine: 0.6 mg/dL (ref 0.6–1.1)
GLUCOSE: 114 mg/dL (ref 70–140)
POTASSIUM: 3.8 meq/L (ref 3.5–5.1)
SODIUM: 137 meq/L (ref 136–145)
TOTAL PROTEIN: 8.3 g/dL (ref 6.4–8.3)
Total Bilirubin: 0.27 mg/dL (ref 0.20–1.20)

## 2014-06-07 LAB — LACTATE DEHYDROGENASE (CC13): LDH: 243 U/L (ref 125–245)

## 2014-06-07 LAB — T4, FREE: Free T4: 1.89 ng/dL — ABNORMAL HIGH (ref 0.60–1.60)

## 2014-06-07 MED ORDER — METHIMAZOLE 10 MG PO TABS: 10.0000 mg | ORAL_TABLET | Freq: Two times a day (BID) | ORAL | Status: DC

## 2014-06-07 MED ORDER — PREDNISONE 20 MG PO TABS: ORAL_TABLET | ORAL | Status: DC

## 2014-06-07 NOTE — Patient Instructions (Addendum)
Thyroid blood tests are being requested for you today.  We'll contact you with results. if ever you have fever while taking methimazole, stop it and call us, because of the risk of a rare side-effect In view of your medical condition, you should avoid pregnancy until we have decided it is safe Please come back for a follow-up appointment in 2-4 weeks. i'll refill your medication to [facility], when I have the test results.

## 2014-06-07 NOTE — Progress Notes (Signed)
Subjective:    Patient ID: Kellie Shaffer, female    DOB: 1991/02/10, 23 y.o.   MRN: [id number]  HPI Pt returns for f/u of hyperthyroidism (dx'ed 2013, during a pregnancy; ; US showed a small multinodular goiter; she had an elective termination; and was rx'ed with tapazole). She has not ret for f/u since then.  She has been off tapazole x 7-8 months.  she was recently in the hospital for acute appendicitis, and was found to have severe thrombocytopenia.  She is being f/u by hematol.  She was also found to have hyperthyroidism, and the tapazole was resumed.  Since back on it, she feels much better.  She has slight palpitations in the chest, but no assoc sob.  Pt says she is not at risk for pregnancy. Past Medical History  Diagnosis Date  . Hyperthyroidism   . Hypercholesteremia   . Ovarian cyst   . Anemia   . Abortion May 2013  . Atrial fibrillation     No past surgical history on file.  History   Social History  . Marital Status: Single    Spouse Name: N/A    Number of Children: N/A  . Years of Education: 14   Occupational History  . [employer]    Social History Main Topics  . Smoking status: Current Some Day Smoker -- 0.25 packs/day    Types: Cigarettes  . Smokeless tobacco: Not on file  . Alcohol Use: 0.0 oz/week    0 Glasses of wine, 0 Shots of liquor per week     Comment: occasionally  . Drug Use: No  . Sexual Activity: Yes    Birth Control/ Protection: Condom, None   Other Topics Concern  . Not on file   Social History Narrative   Regular exercise-yes   Caffeine Use-yes          Current Outpatient Prescriptions on File Prior to Visit  Medication Sig Dispense Refill  . predniSONE (DELTASONE) 20 MG tablet As in the comments  50 tablet  0   No current facility-administered medications on file prior to visit.    Allergies  Allergen Reactions  . Benadryl [Diphenhydramine Hcl] Anaphylaxis and Hives  . Chloroxine Hives    Clorox    Family  History  Problem Relation Age of Onset  . Diabetes Other     Parent  . Cancer Other     Breast Cancer-Parent  . Hypertension Other     Parent  . Hyperlipidemia Other     Parent  . Arthritis Other     Parent    BP 136/84  Pulse 76  Temp(Src) 98.7 F (37.1 C) (Oral)  Ht _0  (1.753 m)  Wt 203 lb (92.08 kg)  BMI 29.96 kg/m2  SpO2 97%  LMP 05/05/2014  Review of Systems Denies fever and tremor.      Objective:   Physical Exam VITAL SIGNS:  See vs page GENERAL: no distress head: no deformity eyes: no periorbital swelling, moderate bilateral proptosis external nose and ears are normal mouth: no lesion seen LUNGS:  Clear to auscultation HEART:  Regular rate and rhythm without murmurs noted. Normal S1,S2.   NECK: There is no palpable thyroid enlargement.  No thyroid nodule is palpable.  No palpable lymphadenopathy at the anterior neck. Lab Results  Component Value Date   TSH 0.04* 06/07/2014       Assessment & Plan:  Hyperthyroidism, improved back on tapazole.   Patient is advised the following: Patient  Instructions  Thyroid blood tests are being requested for you today.  We'll contact you with results. if ever you have fever while taking methimazole, stop it and call us, because of the risk of a rare side-effect In view of your medical condition, you should avoid pregnancy until we have decided it is safe Please come back for a follow-up appointment in 2-4 weeks. i'll refill your medication to [facility], when I have the test results.  addendum: Please continue the same tapazole.

## 2014-06-07 NOTE — Progress Notes (Signed)
[facility] [phone]   Fax:(336) [phone]  OFFICE PROGRESS NOTE  No PCP Per Patient No address on file  DIAGNOSIS: Idiopathic thrombocytopenic purpura  PRIOR THERAPY: None  CURRENT THERAPY: Tapering dose of prednisone currently 90 mg by mouth daily and this will be tapered over the next few weeks  INTERVAL HISTORY: Kellie Shaffer 23 y.o. female returns to the clinic today for hospital followup visit. The patient is feeling fine today with no specific complaints. She denied having any significant bleeding issues. She has no chest pain, shortness breath, cough or hemoptysis. The patient denied having any fatigue or weakness. She is tolerating her treatment with prednisone fairly well. She was seen during her hospitalization with abdominal pain and the patient had questionable appendicitis at that time. Her platelets count were low at that time less than 5000. The patient was treated medically for her appendicitis. She is here today for evaluation and repeat blood work.  MEDICAL HISTORY: Past Medical History  Diagnosis Date  . Hyperthyroidism   . Hypercholesteremia   . Ovarian cyst   . Anemia   . Abortion May 2013  . Atrial fibrillation     ALLERGIES:  is allergic to benadryl and chloroxine.  MEDICATIONS:  Current Outpatient Prescriptions  Medication Sig Dispense Refill  . methimazole (TAPAZOLE) 10 MG tablet Take 1 tablet (10 mg total) by mouth 2 (two) times daily.  28 tablet  0  . predniSONE (DELTASONE) 20 MG tablet As in the comments  50 tablet  0   No current facility-administered medications for this visit.    REVIEW OF SYSTEMS:  A comprehensive review of systems was negative.   PHYSICAL EXAMINATION: General appearance: alert, cooperative and no distress Head: Normocephalic, without obvious abnormality, atraumatic Neck: no adenopathy, no JVD, supple, symmetrical, trachea midline and thyroid not enlarged, symmetric, no  tenderness/mass/nodules Lymph nodes: Cervical, supraclavicular, and axillary nodes normal. Resp: clear to auscultation bilaterally Back: symmetric, no curvature. ROM normal. No CVA tenderness. Cardio: regular rate and rhythm, S1, S2 normal, no murmur, click, rub or gallop GI: soft, non-tender; bowel sounds normal; no masses,  no organomegaly Extremities: extremities normal, atraumatic, no cyanosis or edema  ECOG PERFORMANCE STATUS: 0 - Asymptomatic  Blood pressure 150/94, pulse 74, temperature 98.3 F (36.8 C), temperature source Oral, resp. rate 18, height 5\' 9"  (1.753 m), weight 201 lb 8 oz (91.4 kg), last menstrual period 05/05/2014, SpO2 100.00%.  LABORATORY DATA: Lab Results  Component Value Date   WBC 11.5* 06/07/2014   HGB 11.0* 06/07/2014   HCT 35.5 06/07/2014   MCV 73.4* 06/07/2014   PLT 264 06/07/2014      Chemistry      Component Value Date/Time   NA 137 05/28/2014 0610   K 3.9 05/28/2014 0610   CL 99 05/28/2014 0610   CO2 19 05/28/2014 0610   BUN 8 05/28/2014 0610   CREATININE 0.37* 05/28/2014 0610      Component Value Date/Time   CALCIUM 10.0 05/28/2014 0610   ALKPHOS 69 05/27/2014 0720   AST 13 05/27/2014 0720   ALT 17 05/27/2014 0720   BILITOT 1.5* 05/27/2014 0720       RADIOGRAPHIC STUDIES: Ct Abdomen Pelvis W Contrast  05/26/2014   CLINICAL DATA:  Right lower quadrant pain. Nausea. Initial evaluation  EXAM: CT ABDOMEN AND PELVIS WITH CONTRAST  TECHNIQUE: Multidetector CT imaging of the abdomen and pelvis was performed using the standard protocol following bolus administration of intravenous contrast.  CONTRAST:  100mL OMNIPAQUE IOHEXOL 300 MG/ML  SOLN  COMPARISON:  Ultrasound 01/02/2012.  FINDINGS: Liver normal. Spleen normal. Pancreas normal. No biliary distention. The gallbladder is nondistended.  Adrenals are normal. No focal renal abnormality. No hydronephrosis or obstructing ureteral stone. The bladder is nondistended. Mild endometrial thickening.  This may be physiologic. No adnexal mass. Small amount of free pelvic fluid.  Multiple inguinal, iliac, retroperitoneal lymph nodes noted. Inguinal and iliac lymph nodes up to 1.1 cm noted. Retroperitoneal lymph nodes up to 8 mm. Abdominal aorta normal in caliber and widely patent. Visceral vessels patent. Portal vein patent.  Appendix is distended to 1.3 cm. This is consistent with appendicitis. Mild periappendiceal fat planes edema noted. No evidence of bowel obstruction. Stomach is nondistended. No free air. No mesenteric mass lesion. No significant abdominal wall hernia.  Mild subsegmental atelectasis left lung base. Heart size normal. No acute bony abnormality.  IMPRESSION: 1. Appendicitis. 2. Bilateral inguinal/iliac and retroperitoneal prominent lymph nodes. Following resolution of the patient's acute illness, follow-up CT of the abdomen and pelvis to demonstrate resolution of adenopathy suggested .   Electronically Signed   By: [name]  Register   On: 05/26/2014 20:39    ASSESSMENT AND PLAN: This is a very pleasant [name] recently diagnosed with idiopathic thrombocytopenic purpura. She is currently on treatment with prednisone 90 mg by mouth daily. Her platelets count has completely recovered to the normal range. I recommended for the patient to continue on prednisone but will taper the dose gradually. She was given a tapering schedule for her prednisone. I sent a refill for her prescription. She would come back for followup visit in 2 weeks for reevaluation and repeat CBC, comprehensive metabolic panel and LDH. For the hyperthyroidism, the patient was seen earlier today by Dr. Everardo AllEllison. She was advised to call immediately if she has any concerning symptoms in the interval. The patient voices understanding of current disease status and treatment options and is in agreement with the current care plan.  All questions were answered. The patient knows to call the clinic  with any problems, questions or concerns. We can certainly see the patient much sooner if necessary.  Disclaimer: This note was dictated with voice recognition software. Similar sounding words can inadvertently be transcribed and may not be corrected upon review.

## 2014-06-07 NOTE — Telephone Encounter (Signed)
Gave avs & appt d/t.

## 2014-06-07 NOTE — Progress Notes (Signed)
Checked in new patient she has no insurance but was given the financial app and I gave her medicaid app. She knows she will be self pay. She has appt card and my card.

## 2014-06-07 NOTE — Patient Instructions (Signed)
Smoking Cessation, Tips for Success  If you are ready to quit smoking, congratulations! You have chosen to help yourself be healthier. Cigarettes bring nicotine, tar, carbon monoxide, and other irritants into your body. Your lungs, heart, and blood vessels will be able to work better without these poisons. There are many different ways to quit smoking. Nicotine gum, nicotine patches, a nicotine inhaler, or nicotine nasal spray can help with physical craving. Hypnosis, support groups, and medicines help break the habit of smoking.  WHAT THINGS CAN I DO TO MAKE QUITTING EASIER?   Here are some tips to help you quit for good:  · Pick a date when you will quit smoking completely. Tell all of your friends and family about your plan to quit on that date.  · Do not try to slowly cut down on the number of cigarettes you are smoking. Pick a quit date and quit smoking completely starting on that day.  · Throw away all cigarettes.    · Clean and remove all ashtrays from your home, work, and car.  · On a card, write down your reasons for quitting. Carry the card with you and read it when you get the urge to smoke.  · Cleanse your body of nicotine. Drink enough water and fluids to keep your urine clear or pale yellow. Do this after quitting to flush the nicotine from your body.  · Learn to predict your moods. Do not let a bad situation be your excuse to have a cigarette. Some situations in your life might tempt you into wanting a cigarette.  · Never have "just one" cigarette. It leads to wanting another and another. Remind yourself of your decision to quit.  · Change habits associated with smoking. If you smoked while driving or when feeling stressed, try other activities to replace smoking. Stand up when drinking your coffee. Brush your teeth after eating. Sit in a different chair when you read the paper. Avoid alcohol while trying to quit, and try to drink fewer caffeinated beverages. Alcohol and caffeine may urge you to  smoke.  · Avoid foods and drinks that can trigger a desire to smoke, such as sugary or spicy foods and alcohol.  · Ask people who smoke not to smoke around you.  · Have something planned to do right after eating or having a cup of coffee. For example, plan to take a walk or exercise.  · Try a relaxation exercise to calm you down and decrease your stress. Remember, you may be tense and nervous for the first 2 weeks after you quit, but this will pass.  · Find new activities to keep your hands busy. Play with a pen, coin, or rubber band. Doodle or draw things on paper.  · Brush your teeth right after eating. This will help cut down on the craving for the taste of tobacco after meals. You can also try mouthwash.    · Use oral substitutes in place of cigarettes. Try using lemon drops, carrots, cinnamon sticks, or chewing gum. Keep them handy so they are available when you have the urge to smoke.  · When you have the urge to smoke, try deep breathing.  · Designate your home as a nonsmoking area.  · If you are a heavy smoker, ask your health care provider about a prescription for nicotine chewing gum. It can ease your withdrawal from nicotine.  · Reward yourself. Set aside the cigarette money you save and buy yourself something nice.  · Look for   support from others. Join a support group or smoking cessation program. Ask someone at home or at work to help you with your plan to quit smoking.  · Always ask yourself, "Do I need this cigarette or is this just a reflex?" Tell yourself, "Today, I choose not to smoke," or "I do not want to smoke." You are reminding yourself of your decision to quit.  · Do not replace cigarette smoking with electronic cigarettes (commonly called e-cigarettes). The safety of e-cigarettes is unknown, and some may contain harmful chemicals.  · If you relapse, do not give up! Plan ahead and think about what you will do the next time you get the urge to smoke.  HOW WILL I FEEL WHEN I QUIT SMOKING?  You  may have symptoms of withdrawal because your body is used to nicotine (the addictive substance in cigarettes). You may crave cigarettes, be irritable, feel very hungry, cough often, get headaches, or have difficulty concentrating. The withdrawal symptoms are only temporary. They are strongest when you first quit but will go away within 10-14 days. When withdrawal symptoms occur, stay in control. Think about your reasons for quitting. Remind yourself that these are signs that your body is healing and getting used to being without cigarettes. Remember that withdrawal symptoms are easier to treat than the major diseases that smoking can cause.   Even after the withdrawal is over, expect periodic urges to smoke. However, these cravings are generally short lived and will go away whether you smoke or not. Do not smoke!  WHAT RESOURCES ARE AVAILABLE TO HELP ME QUIT SMOKING?  Your health care provider can direct you to community resources or hospitals for support, which may include:  · Group support.  · Education.  · Hypnosis.  · Therapy.  Document Released: 05/01/2004 Document Revised: 12/18/2013 Document Reviewed: 01/19/2013  [employer]® Patient Information ©2015 [employer]. This information is not intended to replace advice given to you by your health care provider. Make sure you discuss any questions you have with your health care provider.

## 2014-06-18 ENCOUNTER — Encounter: Payer: Self-pay | Admitting: Internal Medicine

## 2014-06-21 ENCOUNTER — Telehealth: Payer: Self-pay | Admitting: Internal Medicine

## 2014-06-21 ENCOUNTER — Encounter: Payer: Self-pay | Admitting: Physician Assistant

## 2014-06-21 ENCOUNTER — Other Ambulatory Visit (HOSPITAL_BASED_OUTPATIENT_CLINIC_OR_DEPARTMENT_OTHER): Payer: Self-pay

## 2014-06-21 ENCOUNTER — Ambulatory Visit (HOSPITAL_BASED_OUTPATIENT_CLINIC_OR_DEPARTMENT_OTHER): Payer: Self-pay | Admitting: Physician Assistant

## 2014-06-21 VITALS — BP 157/88 | HR 79 | Temp 98.8°F | Resp 18 | Ht 69.0 in | Wt 207.3 lb

## 2014-06-21 DIAGNOSIS — D693 Immune thrombocytopenic purpura: Secondary | ICD-10-CM

## 2014-06-21 DIAGNOSIS — D696 Thrombocytopenia, unspecified: Secondary | ICD-10-CM

## 2014-06-21 DIAGNOSIS — E039 Hypothyroidism, unspecified: Secondary | ICD-10-CM

## 2014-06-21 LAB — COMPREHENSIVE METABOLIC PANEL (CC13)
ALK PHOS: 67 U/L (ref 40–150)
ALT: 18 U/L (ref 0–55)
AST: 7 U/L (ref 5–34)
Albumin: 3.4 g/dL — ABNORMAL LOW (ref 3.5–5.0)
Anion Gap: 8 mEq/L (ref 3–11)
BUN: 13.1 mg/dL (ref 7.0–26.0)
CHLORIDE: 107 meq/L (ref 98–109)
CO2: 22 mEq/L (ref 22–29)
CREATININE: 0.7 mg/dL (ref 0.6–1.1)
Calcium: 8.8 mg/dL (ref 8.4–10.4)
Glucose: 128 mg/dl (ref 70–140)
POTASSIUM: 3.6 meq/L (ref 3.5–5.1)
Sodium: 137 mEq/L (ref 136–145)
Total Bilirubin: 0.38 mg/dL (ref 0.20–1.20)
Total Protein: 6.9 g/dL (ref 6.4–8.3)

## 2014-06-21 LAB — LACTATE DEHYDROGENASE (CC13): LDH: 132 U/L (ref 125–245)

## 2014-06-21 LAB — CBC WITH DIFFERENTIAL/PLATELET
BASO%: 0.5 % (ref 0.0–2.0)
Basophils Absolute: 0.1 10*3/uL (ref 0.0–0.1)
EOS%: 0.8 % (ref 0.0–7.0)
Eosinophils Absolute: 0.1 10*3/uL (ref 0.0–0.5)
HEMATOCRIT: 34.4 % — AB (ref 34.8–46.6)
HGB: 10.7 g/dL — ABNORMAL LOW (ref 11.6–15.9)
LYMPH%: 47.1 % (ref 14.0–49.7)
MCH: 22.7 pg — AB (ref 25.1–34.0)
MCHC: 31.1 g/dL — AB (ref 31.5–36.0)
MCV: 73 fL — ABNORMAL LOW (ref 79.5–101.0)
MONO#: 1 10*3/uL — ABNORMAL HIGH (ref 0.1–0.9)
MONO%: 8.9 % (ref 0.0–14.0)
NEUT#: 4.8 10*3/uL (ref 1.5–6.5)
NEUT%: 42.7 % (ref 38.4–76.8)
NRBC: 0 % (ref 0–0)
Platelets: 133 10*3/uL — ABNORMAL LOW (ref 145–400)
RBC: 4.72 10*6/uL (ref 3.70–5.45)
RDW: 17.1 % — ABNORMAL HIGH (ref 11.2–14.5)
WBC: 11.1 10*3/uL — AB (ref 3.9–10.3)
lymph#: 5.2 10*3/uL — ABNORMAL HIGH (ref 0.9–3.3)

## 2014-06-21 NOTE — Telephone Encounter (Signed)
gv pt appt schedule for nov.  °

## 2014-06-21 NOTE — Progress Notes (Signed)
[place] [phone]) [phone]   Fax:(336) [phone]  OFFICE PROGRESS NOTE  No PCP Per Patient No address on file  DIAGNOSIS: Idiopathic thrombocytopenic purpura  PRIOR THERAPY: None  CURRENT THERAPY: Tapering dose of prednisone currently 90 mg by mouth daily and this will be tapered over the next few weeks  INTERVAL HISTORY: Kellie Shaffer 23 y.o. female returns to the clinic today for hospital followup visit. The patient is feeling fine today with no specific complaints. She denied having any significant bleeding issues. She has no chest pain, shortness breath, cough or hemoptysis. The patient denied having any fatigue or weakness. She is tolerating her treatment with prednisone fairly well. She was seen during her hospitalization with abdominal pain and the patient had questionable appendicitis at that time. Her platelets count were low at that time less than 5000. The patient was treated medically for her appendicitis. She is here today for evaluation and repeat blood work. Her current prednisone dose is 60 mg by mouth daily. She is scheduled to decrease to 40 mg by mouth daily starting 06/22/2014.patient states that she has a follow-up with Dr. Loanne Drilling regarding her hyperthyroidism on 07/05/2014 and is to see Dr. Dalbert Batman on 11/16/2015for follow-up regarding possible need for appendectomy.her appetite has increased slightly and she is beginning to have a little difficulty falling asleep but is able to stay asleep, all related to the steroids. She reports "hives". This is been an ongoing issue for her since childhood. She will intermittently develop hives on her chest, extremities and back of unclear etiology.she denied any difficulty breathing/respiratory distress associated with the intermittent development of her hives.  MEDICAL HISTORY: Past Medical History  Diagnosis Date  . Hyperthyroidism   . Hypercholesteremia   . Ovarian cyst   . Anemia   . Abortion May  2013  . Atrial fibrillation     ALLERGIES:  is allergic to benadryl and chloroxine.  MEDICATIONS:  Current Outpatient Prescriptions  Medication Sig Dispense Refill  . cetirizine (ZYRTEC) 10 MG tablet Take 10 mg by mouth daily.    . methimazole (TAPAZOLE) 10 MG tablet Take 1 tablet (10 mg total) by mouth 2 (two) times daily. 30 tablet 2  . predniSONE (DELTASONE) 20 MG tablet As in the comments 50 tablet 0   No current facility-administered medications for this visit.    REVIEW OF SYSTEMS:  A comprehensive review of systems was negative.   PHYSICAL EXAMINATION: General appearance: alert, cooperative and no distress Head: Normocephalic, without obvious abnormality, atraumatic Neck: no adenopathy, no JVD, supple, symmetrical, trachea midline and thyroid not enlarged, symmetric, no tenderness/mass/nodules Lymph nodes: Cervical, supraclavicular, and axillary nodes normal. Resp: clear to auscultation bilaterally Back: symmetric, no curvature. ROM normal. No CVA tenderness. Cardio: regular rate and rhythm, S1, S2 normal, no murmur, click, rub or gallop GI: soft, non-tender; bowel sounds normal; no masses,  no organomegaly Extremities: extremities normal, atraumatic, no cyanosis or edema Skin: Anterior chest there is a small area of urticarial eruptions, also on the upper and mid back.  ECOG PERFORMANCE STATUS: 0 - Asymptomatic  Blood pressure 157/88, pulse 79, temperature 98.8 F (37.1 C), temperature source Oral, resp. rate 18, height '5\' 9"'  (1.753 m), weight 207 lb 4.8 oz (94.031 kg), last menstrual period 05/05/2014.  LABORATORY DATA: Lab Results  Component Value Date   WBC 11.1* 06/21/2014   HGB 10.7* 06/21/2014   HCT 34.4* 06/21/2014   MCV 73.0* 06/21/2014   PLT 133 Large & giant  platelets* 06/21/2014      Chemistry      Component Value Date/Time   NA 137 06/21/2014 1006   NA 137 05/28/2014 0610   K 3.6 06/21/2014 1006   K 3.9 05/28/2014 0610   CL 99 05/28/2014 0610    CO2 22 06/21/2014 1006   CO2 19 05/28/2014 0610   BUN 13.1 06/21/2014 1006   BUN 8 05/28/2014 0610   CREATININE 0.7 06/21/2014 1006   CREATININE 0.37* 05/28/2014 0610      Component Value Date/Time   CALCIUM 8.8 06/21/2014 1006   CALCIUM 10.0 05/28/2014 0610   ALKPHOS 67 06/21/2014 1006   ALKPHOS 69 05/27/2014 0720   AST 7 06/21/2014 1006   AST 13 05/27/2014 0720   ALT 18 06/21/2014 1006   ALT 17 05/27/2014 0720   BILITOT 0.38 06/21/2014 1006   BILITOT 1.5* 05/27/2014 0720       RADIOGRAPHIC STUDIES: Ct Abdomen Pelvis W Contrast  05/26/2014   CLINICAL DATA:  Right lower quadrant pain. Nausea. Initial evaluation  EXAM: CT ABDOMEN AND PELVIS WITH CONTRAST  TECHNIQUE: Multidetector CT imaging of the abdomen and pelvis was performed using the standard protocol following bolus administration of intravenous contrast.  CONTRAST:  129m OMNIPAQUE IOHEXOL 300 MG/ML  SOLN  COMPARISON:  Ultrasound 01/02/2012.  FINDINGS: Liver normal. Spleen normal. Pancreas normal. No biliary distention. The gallbladder is nondistended.  Adrenals are normal. No focal renal abnormality. No hydronephrosis or obstructing ureteral stone. The bladder is nondistended. Mild endometrial thickening. This may be physiologic. No adnexal mass. Small amount of free pelvic fluid.  Multiple inguinal, iliac, retroperitoneal lymph nodes noted. Inguinal and iliac lymph nodes up to 1.1 cm noted. Retroperitoneal lymph nodes up to 8 mm. Abdominal aorta normal in caliber and widely patent. Visceral vessels patent. Portal vein patent.  Appendix is distended to 1.3 cm. This is consistent with appendicitis. Mild periappendiceal fat planes edema noted. No evidence of bowel obstruction. Stomach is nondistended. No free air. No mesenteric mass lesion. No significant abdominal wall hernia.  Mild subsegmental atelectasis left lung base. Heart size normal. No acute bony abnormality.  IMPRESSION: 1. Appendicitis. 2. Bilateral inguinal/iliac and  retroperitoneal prominent lymph nodes. Following resolution of the patient's acute illness, follow-up CT of the abdomen and pelvis to demonstrate resolution of adenopathy suggested .   Electronically Signed   By: TMarcello Moores Register   On: 05/26/2014 20:39    ASSESSMENT AND PLAN: This is a very pleasant 23years old African American female recently diagnosed with idiopathic thrombocytopenic purpura. She is currently on treatment with prednisone 90 mg by mouth daily. Her platelets count has completely recovered to the normal range, although now 133,000 with on 06/07/2014 her platelet count was 264,000. Patient was discussed with and also seen by Dr. MJulien Nordmann She will continue her prednisone taper as previously outlined, decreasing to 40 mg by mouth daily for a week beginning 06/22/2014. She'll follow-up in 2 weeks for another symptom management visit with a repeat CBC differential, C met and LDH. She is encouraged to keep her follow-up appointments with both endocrinology and surgery as scheduled. Regarding her hives(of unclear etiology), should she develop any respiratory distress or other signs or symptoms of concern, she is to report to the nearest emergency room for further evaluation and management. Patient voiced understanding..Marland Kitchen She was advised to call immediately if she has any concerning symptoms in the interval. The patient voices understanding of current disease status and treatment options and is in agreement with  the current care plan.  All questions were answered. The patient knows to call the clinic with any problems, questions or concerns. We can certainly see the patient much sooner if necessary.  Carlton Adam PA-C  ADDENDUM: Hematology/Oncology Attending: I had a face to face encounter with the patient. I recommended her care plan. This is a very pleasant 23 years old African-American female with idiopathic thrombocytopenic purpura currently on a tapering dose of prednisone 60 mg by  mouth daily and tolerating her treatment well. Her platelets count are down from last time she was here and today 133,000 with large and giant platelets. We will continue to taper her dose of prednisone slowly and the patient will start this week with 40 mg by mouth daily for 1 week followed by 20 mg by mouth daily for 1 week. We will see her back for follow-up visit in 2 weeks for reevaluation and repeat blood work. She was advised to call immediately if she has any concerning symptoms in the interval. Disclaimer: This note was dictated with voice recognition software. Similar sounding words can inadvertently be transcribed and may not be corrected upon review. Eilleen Kempf., MD 06/23/2014

## 2014-06-22 NOTE — Patient Instructions (Signed)
Continue prednisone taper as previously outlined Keep your appointments with both Dr. Derrell LollingIngram and Dr. Everardo AllEllison is scheduled Follow-up in 2 weeks

## 2014-07-05 ENCOUNTER — Telehealth: Payer: Self-pay | Admitting: Internal Medicine

## 2014-07-05 ENCOUNTER — Ambulatory Visit (INDEPENDENT_AMBULATORY_CARE_PROVIDER_SITE_OTHER): Payer: Self-pay | Admitting: Endocrinology

## 2014-07-05 ENCOUNTER — Other Ambulatory Visit (HOSPITAL_BASED_OUTPATIENT_CLINIC_OR_DEPARTMENT_OTHER): Payer: Self-pay

## 2014-07-05 ENCOUNTER — Encounter: Payer: Self-pay | Admitting: Internal Medicine

## 2014-07-05 ENCOUNTER — Encounter: Payer: Self-pay | Admitting: Endocrinology

## 2014-07-05 ENCOUNTER — Ambulatory Visit (HOSPITAL_BASED_OUTPATIENT_CLINIC_OR_DEPARTMENT_OTHER): Payer: Self-pay | Admitting: Internal Medicine

## 2014-07-05 VITALS — BP 130/90 | HR 84 | Temp 98.7°F | Ht 69.0 in | Wt 214.0 lb

## 2014-07-05 VITALS — BP 153/77 | HR 82 | Temp 98.2°F | Resp 20 | Ht 69.0 in | Wt 213.7 lb

## 2014-07-05 DIAGNOSIS — D693 Immune thrombocytopenic purpura: Secondary | ICD-10-CM

## 2014-07-05 DIAGNOSIS — I483 Typical atrial flutter: Secondary | ICD-10-CM

## 2014-07-05 DIAGNOSIS — D696 Thrombocytopenia, unspecified: Secondary | ICD-10-CM

## 2014-07-05 DIAGNOSIS — I4892 Unspecified atrial flutter: Secondary | ICD-10-CM | POA: Insufficient documentation

## 2014-07-05 DIAGNOSIS — E0590 Thyrotoxicosis, unspecified without thyrotoxic crisis or storm: Secondary | ICD-10-CM

## 2014-07-05 LAB — CBC WITH DIFFERENTIAL/PLATELET
BASO%: 0.1 % (ref 0.0–2.0)
Basophils Absolute: 0 10*3/uL (ref 0.0–0.1)
EOS%: 1.1 % (ref 0.0–7.0)
Eosinophils Absolute: 0.1 10*3/uL (ref 0.0–0.5)
HCT: 34.9 % (ref 34.8–46.6)
HGB: 11 g/dL — ABNORMAL LOW (ref 11.6–15.9)
LYMPH#: 3.1 10*3/uL (ref 0.9–3.3)
LYMPH%: 42.3 % (ref 14.0–49.7)
MCH: 23.2 pg — AB (ref 25.1–34.0)
MCHC: 31.5 g/dL (ref 31.5–36.0)
MCV: 73.5 fL — ABNORMAL LOW (ref 79.5–101.0)
MONO#: 0.7 10*3/uL (ref 0.1–0.9)
MONO%: 8.9 % (ref 0.0–14.0)
NEUT#: 3.5 10*3/uL (ref 1.5–6.5)
NEUT%: 47.6 % (ref 38.4–76.8)
PLATELETS: 184 10*3/uL (ref 145–400)
RBC: 4.75 10*6/uL (ref 3.70–5.45)
RDW: 16 % — ABNORMAL HIGH (ref 11.2–14.5)
WBC: 7.4 10*3/uL (ref 3.9–10.3)
nRBC: 0 % (ref 0–0)

## 2014-07-05 LAB — LACTATE DEHYDROGENASE (CC13): LDH: 269 U/L — ABNORMAL HIGH (ref 125–245)

## 2014-07-05 LAB — COMPREHENSIVE METABOLIC PANEL (CC13)
ALT: 19 U/L (ref 0–55)
AST: 13 U/L (ref 5–34)
Albumin: 3.7 g/dL (ref 3.5–5.0)
Alkaline Phosphatase: 76 U/L (ref 40–150)
Anion Gap: 10 mEq/L (ref 3–11)
BILIRUBIN TOTAL: 0.4 mg/dL (ref 0.20–1.20)
BUN: 12.8 mg/dL (ref 7.0–26.0)
CO2: 23 mEq/L (ref 22–29)
CREATININE: 0.7 mg/dL (ref 0.6–1.1)
Calcium: 9.2 mg/dL (ref 8.4–10.4)
Chloride: 106 mEq/L (ref 98–109)
Glucose: 116 mg/dl (ref 70–140)
Potassium: 3.7 mEq/L (ref 3.5–5.1)
Sodium: 138 mEq/L (ref 136–145)
Total Protein: 7.4 g/dL (ref 6.4–8.3)

## 2014-07-05 LAB — TSH: TSH: 0.009 u[IU]/mL — AB (ref 0.350–4.500)

## 2014-07-05 LAB — T4, FREE: Free T4: 1.25 ng/dL (ref 0.80–1.80)

## 2014-07-05 MED ORDER — METHIMAZOLE 10 MG PO TABS: 10.0000 mg | ORAL_TABLET | Freq: Every day | ORAL | Status: DC

## 2014-07-05 NOTE — Patient Instructions (Signed)
Smoking Cessation Quitting smoking is important to your health and has many advantages. However, it is not always easy to quit since nicotine is a very addictive drug. Oftentimes, people try 3 times or more before being able to quit. This document explains the best ways for you to prepare to quit smoking. Quitting takes hard work and a lot of effort, but you can do it. ADVANTAGES OF QUITTING SMOKING  You will live longer, feel better, and live better.  Your body will feel the impact of quitting smoking almost immediately.  Within 20 minutes, blood pressure decreases. Your pulse returns to its normal level.  After 8 hours, carbon monoxide levels in the blood return to normal. Your oxygen level increases.  After 24 hours, the chance of having a heart attack starts to decrease. Your breath, hair, and body stop smelling like smoke.  After 48 hours, damaged nerve endings begin to recover. Your sense of taste and smell improve.  After 72 hours, the body is virtually free of nicotine. Your bronchial tubes relax and breathing becomes easier.  After 2 to 12 weeks, lungs can hold more air. Exercise becomes easier and circulation improves.  The risk of having a heart attack, stroke, cancer, or lung disease is greatly reduced.  After 1 year, the risk of coronary heart disease is cut in half.  After 5 years, the risk of stroke falls to the same as a nonsmoker.  After 10 years, the risk of lung cancer is cut in half and the risk of other cancers decreases significantly.  After 15 years, the risk of coronary heart disease drops, usually to the level of a nonsmoker.  If you are pregnant, quitting smoking will improve your chances of having a healthy baby.  The people you live with, especially any children, will be healthier.  You will have extra money to spend on things other than cigarettes. QUESTIONS TO THINK ABOUT BEFORE ATTEMPTING TO QUIT You may want to talk about your answers with your  health care provider.  Why do you want to quit?  If you tried to quit in the past, what helped and what did not?  What will be the most difficult situations for you after you quit? How will you plan to handle them?  Who can help you through the tough times? Your family? Friends? A health care provider?  What pleasures do you get from smoking? What ways can you still get pleasure if you quit? Here are some questions to ask your health care provider:  How can you help me to be successful at quitting?  What medicine do you think would be best for me and how should I take it?  What should I do if I need more help?  What is smoking withdrawal like? How can I get information on withdrawal? GET READY  Set a quit date.  Change your environment by getting rid of all cigarettes, ashtrays, matches, and lighters in your home, car, or work. Do not let people smoke in your home.  Review your past attempts to quit. Think about what worked and what did not. GET SUPPORT AND ENCOURAGEMENT You have a better chance of being successful if you have help. You can get support in many ways.  Tell your family, friends, and coworkers that you are going to quit and need their support. Ask them not to smoke around you.  Get individual, group, or telephone counseling and support. Programs are available at local hospitals and health centers. Call   your local health department for information about programs in your area.  Spiritual beliefs and practices may help some smokers quit.  Download a "quit meter" on your computer to keep track of quit statistics, such as how long you have gone without smoking, cigarettes not smoked, and money saved.  Get a self-help book about quitting smoking and staying off tobacco. LEARN NEW SKILLS AND BEHAVIORS  Distract yourself from urges to smoke. Talk to someone, go for a walk, or occupy your time with a task.  Change your normal routine. Take a different route to work.  Drink tea instead of coffee. Eat breakfast in a different place.  Reduce your stress. Take a hot bath, exercise, or read a book.  Plan something enjoyable to do every day. Reward yourself for not smoking.  Explore interactive web-based programs that specialize in helping you quit. GET MEDICINE AND USE IT CORRECTLY Medicines can help you stop smoking and decrease the urge to smoke. Combining medicine with the above behavioral methods and support can greatly increase your chances of successfully quitting smoking.  Nicotine replacement therapy helps deliver nicotine to your body without the negative effects and risks of smoking. Nicotine replacement therapy includes nicotine gum, lozenges, inhalers, nasal sprays, and skin patches. Some may be available over-the-counter and others require a prescription.  Antidepressant medicine helps people abstain from smoking, but how this works is unknown. This medicine is available by prescription.  Nicotinic receptor partial agonist medicine simulates the effect of nicotine in your brain. This medicine is available by prescription. Ask your health care provider for advice about which medicines to use and how to use them based on your health history. Your health care provider will tell you what side effects to look out for if you choose to be on a medicine or therapy. Carefully read the information on the package. Do not use any other product containing nicotine while using a nicotine replacement product.  RELAPSE OR DIFFICULT SITUATIONS Most relapses occur within the first 3 months after quitting. Do not be discouraged if you start smoking again. Remember, most people try several times before finally quitting. You may have symptoms of withdrawal because your body is used to nicotine. You may crave cigarettes, be irritable, feel very hungry, cough often, get headaches, or have difficulty concentrating. The withdrawal symptoms are only temporary. They are strongest  when you first quit, but they will go away within 10-14 days. To reduce the chances of relapse, try to:  Avoid drinking alcohol. Drinking lowers your chances of successfully quitting.  Reduce the amount of caffeine you consume. Once you quit smoking, the amount of caffeine in your body increases and can give you symptoms, such as a rapid heartbeat, sweating, and anxiety.  Avoid smokers because they can make you want to smoke.  Do not let weight gain distract you. Many smokers will gain weight when they quit, usually less than 10 pounds. Eat a healthy diet and stay active. You can always lose the weight gained after you quit.  Find ways to improve your mood other than smoking. FOR MORE INFORMATION  www.smokefree.gov  Document Released: 07/28/2001 Document Revised: 12/18/2013 Document Reviewed: 11/12/2011 [employer] Patient Information 2015 [employer]. This information is not intended to replace advice given to you by your health care provider. Make sure you discuss any questions you have with your health care provider.  

## 2014-07-05 NOTE — Telephone Encounter (Signed)
Gave avs & cal for Feb 2016. °

## 2014-07-05 NOTE — Progress Notes (Signed)
Subjective:    Patient ID: Kellie RockerMarkysha M Shaffer, female    DOB: 04/30/1991, 23 y.o.   MRN: [id number]  HPI Pt returns for f/u of hyperthyroidism (dx'ed 2013, during a pregnancy; ; US showed a small multinodular goiter; she had an elective termination; and was rx'ed with tapazole; she was lost to f/u, but hyperthyroidism was found again during a hospitalization in late 2015 for appendicitis; she was restarted on tapazole then; pt says she is not at risk for pregnancy).  Pt states slight tremor of the hands, and assoc palpitations.  Pt says she has misses tapazole approx 5 days of the past month.   Past Medical History  Diagnosis Date  . Hyperthyroidism   . Hypercholesteremia   . Ovarian cyst   . Anemia   . Abortion May 2013  . Atrial fibrillation     No past surgical history on file.  History   Social History  . Marital Status: Single    Spouse Name: N/A    Number of Children: N/A  . Years of Education: 14   Occupational History  . [employer]    Social History Main Topics  . Smoking status: Current Some Day Smoker -- 0.25 packs/day    Types: Cigarettes  . Smokeless tobacco: Not on file  . Alcohol Use: 0.0 oz/week    0 Glasses of wine, 0 Shots of liquor per week     Comment: occasionally  . Drug Use: No  . Sexual Activity: Yes    Birth Control/ Protection: Condom, None   Other Topics Concern  . Not on file   Social History Narrative   Regular exercise-yes   Caffeine Use-yes          Current Outpatient Prescriptions on File Prior to Visit  Medication Sig Dispense Refill  . cetirizine (ZYRTEC) 10 MG tablet Take 10 mg by mouth daily.    . methimazole (TAPAZOLE) 10 MG tablet Take 1 tablet (10 mg total) by mouth 2 (two) times daily. 30 tablet 2   No current facility-administered medications on file prior to visit.    Allergies  Allergen Reactions  . Benadryl [Diphenhydramine Hcl] Anaphylaxis and Hives  . Chloroxine Hives    Clorox    Family History    Problem Relation Age of Onset  . Diabetes Other     Parent  . Cancer Other     Breast Cancer-Parent  . Hypertension Other     Parent  . Hyperlipidemia Other     Parent  . Arthritis Other     Parent    BP 130/90 mmHg  Pulse 84  Temp(Src) 98.7 F (37.1 C) (Oral)  Ht 5\' 9"  (1.753 m)  Wt 214 lb (97.07 kg)  BMI 31.59 kg/m2  SpO2 97%    Review of Systems Denies fever and excessive diaphoresis.     Objective:   Physical Exam VITAL SIGNS:  See vs page GENERAL: no distress head: no deformity eyes: no periorbital swelling; moderate bilateral proptosis.  external nose and ears are normal.   HEART:  Regular rate and rhythm without murmurs noted. Normal S1,S2.    Lab Results  Component Value Date   TSH 0.009* 07/05/2014      Assessment & Plan:  Hyperthyroidism.  Much better. Noncompliance with medication, but she is getting most of her doses.  She is advised to take as rx'ed   Patient is advised the following: Patient Instructions  Thyroid blood tests are being requested for you today.  We'll contact you with results. if ever you have fever while taking methimazole, stop it and call us, because of the risk of a rare side-effect In view of your medical condition, you should avoid pregnancy until we have decided it is safe Please come back for a follow-up appointment in 1 month.  addendum: decrease tapazole to 10 mg qd.

## 2014-07-05 NOTE — Progress Notes (Signed)
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  INTERVAL HISTORY: Kellie Shaffer 23 y.o. female returns to the clinic today for follow-up visit. The patient is feeling fine today with no specific complaints. She completed the taper dose of prednisone few days ago. She denied having any significant bleeding, bruises or ecchymosis. The patient denied having any significant weight loss or night sweats. She has no nausea or vomiting. She had repeat CBC performed earlier today and she is here for evaluation and discussion of her lab results.  MEDICAL HISTORY: Past Medical History  Diagnosis Date  . Hyperthyroidism   . Hypercholesteremia   . Ovarian cyst   . Anemia   . Abortion May 2013  . Atrial fibrillation     ALLERGIES:  is allergic to benadryl and chloroxine.  MEDICATIONS:  Current Outpatient Prescriptions  Medication Sig Dispense Refill  . cetirizine (ZYRTEC) 10 MG tablet Take 10 mg by mouth daily.    . methimazole (TAPAZOLE) 10 MG tablet Take 1 tablet (10 mg total) by mouth 2 (two) times daily. 30 tablet 2  . predniSONE (DELTASONE) 20 MG tablet As in the comments 50 tablet 0   No current facility-administered medications for this visit.    SURGICAL HISTORY: No past surgical history on file.  REVIEW OF SYSTEMS:  A comprehensive review of systems was negative.   PHYSICAL EXAMINATION: General appearance: alert, cooperative and no distress Head: Normocephalic, without obvious abnormality, atraumatic Neck: no adenopathy, no JVD, supple, symmetrical, trachea midline and thyroid not enlarged, symmetric, no tenderness/mass/nodules Lymph nodes: Cervical, supraclavicular, and axillary nodes normal. Resp: clear to auscultation  bilaterally Cardio: regular rate and rhythm, S1, S2 normal, no murmur, click, rub or gallop GI: soft, non-tender; bowel sounds normal; no masses,  no organomegaly Extremities: extremities normal, atraumatic, no cyanosis or edema  ECOG PERFORMANCE STATUS: 0 - Asymptomatic  Blood pressure 153/77, pulse 82, temperature 98.2 F (36.8 C), temperature source Oral, resp. rate 20, height 5\' 9"  (1.753 m), weight 213 lb 11.2 oz (96.934 kg), SpO2 100 %.  LABORATORY DATA: Lab Results  Component Value Date   WBC 7.4 07/05/2014   HGB 11.0* 07/05/2014   HCT 34.9 07/05/2014   MCV 73.5* 07/05/2014   PLT 184 07/05/2014      Chemistry      Component Value Date/Time   NA 137 06/21/2014 1006   NA 137 05/28/2014 0610   K 3.6 06/21/2014 1006   K 3.9 05/28/2014 0610   CL 99 05/28/2014 0610   CO2 22 06/21/2014 1006   CO2 19 05/28/2014 0610   BUN 13.1 06/21/2014 1006   BUN 8 05/28/2014 0610   CREATININE 0.7 06/21/2014 1006   CREATININE 0.37* 05/28/2014 0610      Component Value Date/Time   CALCIUM 8.8 06/21/2014 1006   CALCIUM 10.0 05/28/2014 0610   ALKPHOS 67 06/21/2014 1006   ALKPHOS 69 05/27/2014 0720   AST 7 06/21/2014 1006   AST 13 05/27/2014 0720   ALT 18 06/21/2014 1006   ALT 17 05/27/2014 0720   BILITOT 0.38 06/21/2014 1006   BILITOT 1.5* 05/27/2014 0720       RADIOGRAPHIC STUDIES: No results found.  ASSESSMENT AND PLAN: This is a very pleasant 23 years old African-American female with history of idiopathic  thrombocytopenic purpura status post a tapered dose of prednisone with significant improvement in her platelets count. Her CBC today showed normal platelets count. I recommended for the patient to continue on observation with repeat CBC, comprehensive metabolic panel and LDH in 3 months. She would come back for follow-up visit at that time. The patient voices understanding of current disease status and treatment options and is in agreement with the current care plan.  All  questions were answered. The patient knows to call the clinic with any problems, questions or concerns. We can certainly see the patient much sooner if necessary.  Disclaimer: This note was dictated with voice recognition software. Similar sounding words can inadvertently be transcribed and may not be corrected upon review.

## 2014-07-05 NOTE — Patient Instructions (Signed)
Thyroid blood tests are being requested for you today.  We'll contact you with results. if ever you have fever while taking methimazole, stop it and call us, because of the risk of a rare side-effect In view of your medical condition, you should avoid pregnancy until we have decided it is safe Please come back for a follow-up appointment in 1 month.

## 2014-08-20 ENCOUNTER — Ambulatory Visit: Payer: Self-pay | Admitting: Endocrinology

## 2014-08-27 ENCOUNTER — Telehealth: Payer: Self-pay | Admitting: Endocrinology

## 2014-08-27 ENCOUNTER — Ambulatory Visit: Payer: Self-pay | Admitting: Endocrinology

## 2014-08-27 NOTE — Telephone Encounter (Signed)
Follow up advised. Contact patient and schedule visit in 2 weeks. 

## 2014-08-27 NOTE — Telephone Encounter (Signed)
Patient no showed today's appt. Please advise on how to follow up. °A. No follow up necessary. °B. Follow up urgent. Contact patient immediately. °C. Follow up necessary. Contact patient and schedule visit in ___ days. °D. Follow up advised. Contact patient and schedule visit in ____weeks. ° °

## 2014-08-28 NOTE — Telephone Encounter (Signed)
Lvom advising of no show appointment. Letter sent.

## 2014-10-02 ENCOUNTER — Ambulatory Visit: Payer: Self-pay | Admitting: Internal Medicine

## 2014-10-02 ENCOUNTER — Other Ambulatory Visit: Payer: Self-pay

## 2015-05-09 ENCOUNTER — Encounter (HOSPITAL_COMMUNITY): Payer: Self-pay | Admitting: *Deleted

## 2015-05-09 ENCOUNTER — Emergency Department (HOSPITAL_COMMUNITY): Payer: Self-pay

## 2015-05-09 ENCOUNTER — Emergency Department (HOSPITAL_COMMUNITY)
Admission: EM | Admit: 2015-05-09 | Discharge: 2015-05-10 | Disposition: A | Discharge status: Home / Self Care | Payer: Self-pay | Attending: Emergency Medicine | Admitting: Emergency Medicine

## 2015-05-09 DIAGNOSIS — Z862 Personal history of diseases of the blood and blood-forming organs and certain disorders involving the immune mechanism: Secondary | ICD-10-CM | POA: Insufficient documentation

## 2015-05-09 DIAGNOSIS — Z79899 Other long term (current) drug therapy: Secondary | ICD-10-CM | POA: Insufficient documentation

## 2015-05-09 DIAGNOSIS — I10 Essential (primary) hypertension: Secondary | ICD-10-CM | POA: Insufficient documentation

## 2015-05-09 DIAGNOSIS — J111 Influenza due to unidentified influenza virus with other respiratory manifestations: Secondary | ICD-10-CM | POA: Insufficient documentation

## 2015-05-09 DIAGNOSIS — Z8742 Personal history of other diseases of the female genital tract: Secondary | ICD-10-CM | POA: Insufficient documentation

## 2015-05-09 DIAGNOSIS — E0590 Thyrotoxicosis, unspecified without thyrotoxic crisis or storm: Secondary | ICD-10-CM | POA: Insufficient documentation

## 2015-05-09 DIAGNOSIS — Z720 Tobacco use: Secondary | ICD-10-CM | POA: Insufficient documentation

## 2015-05-09 LAB — CBC WITH DIFFERENTIAL/PLATELET
BASOS PCT: 0 %
Basophils Absolute: 0 10*3/uL (ref 0.0–0.1)
EOS ABS: 0.2 10*3/uL (ref 0.0–0.7)
EOS PCT: 2 %
HCT: 36.5 % (ref 36.0–46.0)
HEMOGLOBIN: 12.1 g/dL (ref 12.0–15.0)
Lymphocytes Relative: 32 %
Lymphs Abs: 2.5 10*3/uL (ref 0.7–4.0)
MCH: 24.9 pg — ABNORMAL LOW (ref 26.0–34.0)
MCHC: 33.2 g/dL (ref 30.0–36.0)
MCV: 75.1 fL — ABNORMAL LOW (ref 78.0–100.0)
MONO ABS: 0.7 10*3/uL (ref 0.1–1.0)
MONOS PCT: 10 %
NEUTROS PCT: 56 %
Neutro Abs: 4.2 10*3/uL (ref 1.7–7.7)
PLATELETS: 148 10*3/uL — AB (ref 150–400)
RBC: 4.86 MIL/uL (ref 3.87–5.11)
RDW: 14.9 % (ref 11.5–15.5)
WBC: 7.6 10*3/uL (ref 4.0–10.5)

## 2015-05-09 LAB — I-STAT TROPONIN, ED: Troponin i, poc: 0.01 ng/mL (ref 0.00–0.08)

## 2015-05-09 LAB — BASIC METABOLIC PANEL
Anion gap: 7 (ref 5–15)
BUN: 6 mg/dL (ref 6–20)
CALCIUM: 9.1 mg/dL (ref 8.9–10.3)
CO2: 21 mmol/L — ABNORMAL LOW (ref 22–32)
CREATININE: 0.52 mg/dL (ref 0.44–1.00)
Chloride: 108 mmol/L (ref 101–111)
GFR calc non Af Amer: >60 mL/min (ref >60)
Glucose, Bld: 86 mg/dL (ref 65–99)
Potassium: 3.8 mmol/L (ref 3.5–5.1)
SODIUM: 136 mmol/L (ref 135–145)

## 2015-05-09 LAB — URINALYSIS, ROUTINE W REFLEX MICROSCOPIC
BILIRUBIN URINE: NEGATIVE
GLUCOSE, UA: NEGATIVE mg/dL
HGB URINE DIPSTICK: NEGATIVE
Ketones, ur: NEGATIVE mg/dL
Leukocytes, UA: NEGATIVE
Nitrite: NEGATIVE
PROTEIN: NEGATIVE mg/dL
Specific Gravity, Urine: 1.018 (ref 1.005–1.030)
UROBILINOGEN UA: 1 mg/dL (ref 0.0–1.0)
pH: 6 (ref 5.0–8.0)

## 2015-05-09 LAB — I-STAT BETA HCG BLOOD, ED (MC, WL, AP ONLY)

## 2015-05-09 LAB — I-STAT CG4 LACTIC ACID, ED: Lactic Acid, Venous: 0.72 mmol/L (ref 0.5–2.0)

## 2015-05-09 MED ORDER — IBUPROFEN 800 MG PO TABS: 800.0000 mg | ORAL_TABLET | Freq: Three times a day (TID) | ORAL | Status: DC

## 2015-05-09 MED ORDER — SODIUM CHLORIDE 0.9 % IV BOLUS (SEPSIS)
1000.0000 mL | Freq: Once | INTRAVENOUS | Status: AC
  Administered 2015-05-09: 1000 mL via INTRAVENOUS

## 2015-05-09 MED ORDER — KETOROLAC TROMETHAMINE 30 MG/ML IJ SOLN
30.0000 mg | Freq: Once | INTRAMUSCULAR | Status: AC
  Administered 2015-05-09: 30 mg via INTRAVENOUS
  Filled 2015-05-09: qty 1

## 2015-05-09 NOTE — ED Notes (Signed)
Pt in c/o cough and congestion since yesterday, cough is productive with yellow sputum, unsure of fever, no distress noted

## 2015-05-09 NOTE — ED Notes (Signed)
Nurse first rounds, pt resting comfortably

## 2015-05-09 NOTE — Discharge Instructions (Signed)
Take tylenol and motrin for pain and fever and myalgias.   Stay hydrated.   See your doctor in a week to repeat blood pressure.  Return to ER if you have fever for a week, chest pain, trouble breathing, vomiting, worse flank pain.

## 2015-05-09 NOTE — ED Provider Notes (Signed)
CSN: [id number]     Arrival date & time 05/09/15  1820 History   First MD Initiated Contact with Patient 05/09/15 2214     Chief Complaint  Patient presents with  . Cough     (Consider location/radiation/quality/duration/timing/severity/associated sxs/prior Treatment) The history is provided by the patient.  Kellie Shaffer is a 24 y.o. female hx of HTN, HL, afib not on meds here with cough, congestion, hypertension. Patient has cough and congestion since yesterday. Also subjective fevers. She also has bilateral flank pain as well but denies urinary symptoms. Denies abdominal pain or chest pain. Has occasional palpitations but denies shortness of breath. One of her friend is sick as well. Was noted to be hypertensive in triage and has hx of hypertension but not currently on meds.    Past Medical History  Diagnosis Date  . Hyperthyroidism   . Hypercholesteremia   . Ovarian cyst   . Anemia   . Abortion May 2013  . Atrial fibrillation    History reviewed. No pertinent past surgical history. Family History  Problem Relation Age of Onset  . Diabetes Other     Parent  . Cancer Other     Breast Cancer-Parent  . Hypertension Other     Parent  . Hyperlipidemia Other     Parent  . Arthritis Other     Parent   Social History  Substance Use Topics  . Smoking status: Current Some Day Smoker -- 0.25 packs/day    Types: Cigarettes  . Smokeless tobacco: None  . Alcohol Use: 0.0 oz/week    0 Glasses of wine, 0 Shots of liquor per week     Comment: occasionally   OB History    Gravida Para Term Preterm AB TAB SAB Ectopic Multiple Living   1              Review of Systems  Constitutional: Positive for fever.  Respiratory: Positive for cough.   Genitourinary: Positive for flank pain.  All other systems reviewed and are negative.     Allergies  Benadryl and Chloroxine  Home Medications   Prior to Admission medications   Medication Sig Start Date End Date Taking?  Authorizing Kellie Shaffer  cetirizine (ZYRTEC) 10 MG tablet Take 10 mg by mouth daily.    Historical Kellie Spearman, MD  methimazole (TAPAZOLE) 10 MG tablet Take 1 tablet (10 mg total) by mouth daily. 07/05/14   Romero Belling, MD   BP 160/87 mmHg  Pulse 85  Temp(Src) 99.2 F (37.3 C) (Oral)  Resp 25  SpO2 100%  LMP 05/09/2015 Physical Exam  Constitutional: She is oriented to person, place, and time. She appears well-developed.  Uncomfortable   HENT:  Head: Normocephalic.  Mouth/Throat: Oropharynx is clear and moist.  Eyes: Conjunctivae are normal. Pupils are equal, round, and reactive to light.  Neck: Normal range of motion. Neck supple.  Cardiovascular: Normal rate, regular rhythm and normal heart sounds.   Pulmonary/Chest: Effort normal and breath sounds normal. No respiratory distress. She has no wheezes. She has no rales.  Abdominal: Soft. Bowel sounds are normal. She exhibits no distension. There is no tenderness. There is no rebound.  Mild bilateral CVAT   Musculoskeletal: Normal range of motion. She exhibits no edema or tenderness.  Neurological: She is alert and oriented to person, place, and time. No cranial nerve deficit. Coordination normal.  Skin: Skin is warm and dry.  Psychiatric: She has a normal mood and affect. Her behavior is normal. Judgment and thought  content normal.  Nursing note and vitals reviewed.   ED Course  Procedures (including critical care time) Labs Review Labs Reviewed  CBC WITH DIFFERENTIAL/PLATELET - Abnormal; Notable for the following:    MCV 75.1 (*)    MCH 24.9 (*)    Platelets 148 (*)    All other components within normal limits  BASIC METABOLIC PANEL - Abnormal; Notable for the following:    CO2 21 (*)    All other components within normal limits  URINALYSIS, ROUTINE W REFLEX MICROSCOPIC (NOT AT [facility])  I-STAT BETA HCG BLOOD, ED ([facility], [facility], [facility] ONLY)  I-STAT CG4 LACTIC ACID, ED  I-STAT TROPOININ, ED    Imaging Review Dg Chest 2 View  05/09/2015    CLINICAL DATA:  Pt states that she has been having tightness in her chest, productive cough x 3 days  EXAM: CHEST  2 VIEW  COMPARISON:  08/31/2012  FINDINGS: Heart is mildly enlarged but stable. There are no focal consolidations. No pleural effusions or pulmonary edema. Visualized osseous structures have a normal appearance.  IMPRESSION: Cardiomegaly.   Electronically Signed   By: Norva Pavlov M.D.   On: 05/09/2015 18:51   I have personally reviewed and evaluated these images and lab results as part of my medical decision-making.   EKG Interpretation   Date/Time:  Thursday May 09 2015 23:25:55 EDT Ventricular Rate:  82 PR Interval:  164 QRS Duration: 97 QT Interval:  403 QTC Calculation: 471 R Axis:   11 Text Interpretation:  Sinus rhythm Probable left atrial enlargement RSR'  in V1 or V2, probably normal variant Nonspecific T abnormalities, anterior  leads No significant change since last tracing Confirmed by YAO  MD, DAVID  ([id number]) on 05/09/2015 11:34:42 PM      MDM   Final diagnoses:  None   Kellie Shaffer is a 24 y.o. female here with cough, congestion, flank pain. Consider pneumonia vs pyelo vs flu. Will get labs, UA, CXR. Will hydrate and reassess.   11:41 PM CXR nl. WBC nl. UA nl. Lactate nl. Bicarb 21 likely from dehydration. Given IVF and toradol and tylenol and felt better. BP improved from 189/111 to 160/87 with no intervention. Discussed risks and benefits of tamiflu and she refused. Recommend hydration, tylenol, motrin.    Richardean Canal, MD 05/09/15 [phone]

## 2015-09-27 ENCOUNTER — Telehealth: Payer: Self-pay | Admitting: *Deleted

## 2015-09-27 NOTE — Telephone Encounter (Signed)
Pt called request to see MD. Last f/u 06/2015. Pt advised she is having " extreme amounts of bleeding, it's only day 2 of her period. Going through an overnight pad q 2-4 hrs" Pt denies feeling fatigued, dizzy, confused or in pain. Pt advised she does not have insurance or a PCP. Usually goes to ED for healthcare.  Discussed with pt I will review with MD who will return on Monday. Encouraged pt to establish care with GYN to assist with her health concerns. Pt advised she has family planning medicai

## 2015-11-26 ENCOUNTER — Encounter (HOSPITAL_BASED_OUTPATIENT_CLINIC_OR_DEPARTMENT_OTHER): Payer: Self-pay | Admitting: *Deleted

## 2015-11-26 ENCOUNTER — Emergency Department (HOSPITAL_BASED_OUTPATIENT_CLINIC_OR_DEPARTMENT_OTHER)
Admission: EM | Admit: 2015-11-26 | Discharge: 2015-11-26 | Disposition: A | Discharge status: Home / Self Care | Payer: Self-pay | Attending: Emergency Medicine | Admitting: Emergency Medicine

## 2015-11-26 DIAGNOSIS — Z8742 Personal history of other diseases of the female genital tract: Secondary | ICD-10-CM | POA: Insufficient documentation

## 2015-11-26 DIAGNOSIS — R6889 Other general symptoms and signs: Secondary | ICD-10-CM

## 2015-11-26 DIAGNOSIS — E0590 Thyrotoxicosis, unspecified without thyrotoxic crisis or storm: Secondary | ICD-10-CM | POA: Insufficient documentation

## 2015-11-26 DIAGNOSIS — Z791 Long term (current) use of non-steroidal anti-inflammatories (NSAID): Secondary | ICD-10-CM | POA: Insufficient documentation

## 2015-11-26 DIAGNOSIS — J029 Acute pharyngitis, unspecified: Secondary | ICD-10-CM | POA: Insufficient documentation

## 2015-11-26 DIAGNOSIS — M791 Myalgia: Secondary | ICD-10-CM | POA: Insufficient documentation

## 2015-11-26 DIAGNOSIS — R0981 Nasal congestion: Secondary | ICD-10-CM | POA: Insufficient documentation

## 2015-11-26 DIAGNOSIS — R6883 Chills (without fever): Secondary | ICD-10-CM | POA: Insufficient documentation

## 2015-11-26 DIAGNOSIS — F17210 Nicotine dependence, cigarettes, uncomplicated: Secondary | ICD-10-CM | POA: Insufficient documentation

## 2015-11-26 DIAGNOSIS — Z79899 Other long term (current) drug therapy: Secondary | ICD-10-CM | POA: Insufficient documentation

## 2015-11-26 DIAGNOSIS — Z8679 Personal history of other diseases of the circulatory system: Secondary | ICD-10-CM | POA: Insufficient documentation

## 2015-11-26 DIAGNOSIS — Z862 Personal history of diseases of the blood and blood-forming organs and certain disorders involving the immune mechanism: Secondary | ICD-10-CM | POA: Insufficient documentation

## 2015-11-26 DIAGNOSIS — R05 Cough: Secondary | ICD-10-CM | POA: Insufficient documentation

## 2015-11-26 MED ORDER — GUAIFENESIN-CODEINE 100-10 MG/5ML PO SOLN: 10.0000 mL | Freq: Four times a day (QID) | ORAL | Status: DC | PRN

## 2015-11-26 MED ORDER — FLUTICASONE PROPIONATE 50 MCG/ACT NA SUSP: 1.0000 | Freq: Every day | NASAL | Status: DC

## 2015-11-26 NOTE — ED Notes (Signed)
MD at bedside. 

## 2015-11-26 NOTE — ED Notes (Signed)
Pt c/o cough, congestion, sore throat, body aches x 4 days

## 2015-11-26 NOTE — Discharge Instructions (Signed)
You may alternate between ibuprofen 800 mg every 8 hours as needed for fever and pain and Tylenol 1000 mg every 6 hours as needed for fever and pain. Please note that ibuprofen is the same thing as Advil and Motrin. Tylenol also known as acetaminophen. Please note that many cold medications already have acetaminophen in them. You should not take more than 4000 mg of acetaminophen in a 24-hour period.  You may use Afrin nasal spray to help with nasal congestion. Please do not use this medication for more than 3 days in a row as it may make your nasal congestion worse.  I also recommend you use an antihistamine such as Zyrtec and Flonase nasal spray which is a nasal steroid to help with your nasal congestion as there may be some component of seasonal allergies making your symptoms worse.  This is a viral illness. I do not feel antibiotics would be helpful for you. Please rest, drink plenty of fluids.    Upper Respiratory Infection, Adult Most upper respiratory infections (URIs) are a viral infection of the air passages leading to the lungs. A URI affects the nose, throat, and upper air passages. The most common type of URI is nasopharyngitis and is typically referred to as "the common cold." URIs run their course and usually go away on their own. Most of the time, a URI does not require medical attention, but sometimes a bacterial infection in the upper airways can follow a viral infection. This is called a secondary infection. Sinus and middle ear infections are common types of secondary upper respiratory infections. Bacterial pneumonia can also complicate a URI. A URI can worsen asthma and chronic obstructive pulmonary disease (COPD). Sometimes, these complications can require emergency medical care and may be life threatening.  CAUSES Almost all URIs are caused by viruses. A virus is a type of germ and can spread from one person to another.  RISKS FACTORS You may be at risk for a URI if:   You  smoke.   You have chronic heart or lung disease.  You have a weakened defense (immune) system.   You are very young or very old.   You have nasal allergies or asthma.  You work in crowded or poorly ventilated areas.  You work in health care facilities or schools. SIGNS AND SYMPTOMS  Symptoms typically develop 2-3 days after you come in contact with a cold virus. Most viral URIs last 7-10 days. However, viral URIs from the influenza virus (flu virus) can last 14-18 days and are typically more severe. Symptoms may include:   Runny or stuffy (congested) nose.   Sneezing.   Cough.   Sore throat.   Headache.   Fatigue.   Fever.   Loss of appetite.   Pain in your forehead, behind your eyes, and over your cheekbones (sinus pain).  Muscle aches.  DIAGNOSIS  Your health care provider may diagnose a URI by:  Physical exam.  Tests to check that your symptoms are not due to another condition such as:  Strep throat.  Sinusitis.  Pneumonia.  Asthma. TREATMENT  A URI goes away on its own with time. It cannot be cured with medicines, but medicines may be prescribed or recommended to relieve symptoms. Medicines may help:  Reduce your fever.  Reduce your cough.  Relieve nasal congestion. HOME CARE INSTRUCTIONS   Take medicines only as directed by your health care provider.   Gargle warm saltwater or take cough drops to comfort your throat as  directed by your health care provider.  Use a warm mist humidifier or inhale steam from a shower to increase air moisture. This may make it easier to breathe.  Drink enough fluid to keep your urine clear or pale yellow.   Eat soups and other clear broths and maintain good nutrition.   Rest as needed.   Return to work when your temperature has returned to normal or as your health care provider advises. You may need to stay home longer to avoid infecting others. You can also use a face mask and careful hand  washing to prevent spread of the virus.  Increase the usage of your inhaler if you have asthma.   Do not use any tobacco products, including cigarettes, chewing tobacco, or electronic cigarettes. If you need help quitting, ask your health care provider. PREVENTION  The best way to protect yourself from getting a cold is to practice good hygiene.   Avoid oral or hand contact with people with cold symptoms.   Wash your hands often if contact occurs.  There is no clear evidence that vitamin C, vitamin E, echinacea, or exercise reduces the chance of developing a cold. However, it is always recommended to get plenty of rest, exercise, and practice good nutrition.  SEEK MEDICAL CARE IF:   You are getting worse rather than better.   Your symptoms are not controlled by medicine.   You have chills.  You have worsening shortness of breath.  You have brown or red mucus.  You have yellow or brown nasal discharge.  You have pain in your face, especially when you bend forward.  You have a fever.  You have swollen neck glands.  You have pain while swallowing.  You have white areas in the back of your throat. SEEK IMMEDIATE MEDICAL CARE IF:   You have severe or persistent:  Headache.  Ear pain.  Sinus pain.  Chest pain.  You have chronic lung disease and any of the following:  Wheezing.  Prolonged cough.  Coughing up blood.  A change in your usual mucus.  You have a stiff neck.  You have changes in your:  Vision.  Hearing.  Thinking.  Mood. MAKE SURE YOU:   Understand these instructions.  Will watch your condition.  Will get help right away if you are not doing well or get worse.   This information is not intended to replace advice given to you by your health care provider. Make sure you discuss any questions you have with your health care provider.   Document Released: 01/27/2001 Document Revised: 12/18/2014 Document Reviewed: 11/08/2013 Elsevier  Interactive Patient Education [employer]   To find a primary care or specialty doctor please call [phone] or [phone] to access "[facility] Find a Doctor Service."  You may also go on the [facility] website at [email or web address]  There are also multiple [facility] practices throughout the [place] that are frequently accepting new patients. You may find a clinic that is close to your home and contact them.  [facility] -  [address] [place] [address] [phone]  [facility] in [place] (also locations in [place] and [place]) -  [address] [place] [address] [phone]  [facility] -  [address] [place] [address] [phone]

## 2015-11-26 NOTE — ED Notes (Signed)
Pt given d/c instructions as per chart. Rx x 2. Verbalizes understanding. No questions. 

## 2015-11-26 NOTE — ED Provider Notes (Signed)
TIME SEEN: 1:50 AM  CHIEF COMPLAINT: Nasal congestion, sinus pressure, sore throat, dry cough, body aches, chills  HPI: Pt is a 25 y.o. female with history of hyperthyroidism currently not on medication, paroxysmal atrial fibrillation, anemia who presents to the emergency department with complaints of 4 days of nasal congestion, runny nose, sinus pressure, dry cough, sore throat, body aches and chills. No documented fever. No vomiting or diarrhea. No rash. No headache, neck pain or neck stiffness. Did not have an influenza vaccination last year. No chest pain or shortness of breath. No palpitations. No recent travel or sick contacts.  ROS: See HPI Constitutional: no fever  Eyes: no drainage  ENT:  runny nose   Cardiovascular:  no chest pain  Resp: no SOB  GI: no vomiting GU: no dysuria Integumentary: no rash  Allergy: no hives  Musculoskeletal: no leg swelling  Neurological: no slurred speech ROS otherwise negative  PAST MEDICAL HISTORY/PAST SURGICAL HISTORY:  Past Medical History  Diagnosis Date  . Hyperthyroidism   . Hypercholesteremia   . Ovarian cyst   . Anemia   . Abortion May 2013  . Atrial fibrillation (HCC)     MEDICATIONS:  Prior to Admission medications   Medication Sig Start Date End Date Taking? Authorizing Provider  cetirizine (ZYRTEC) 10 MG tablet Take 10 mg by mouth daily.    Historical Provider, MD  ibuprofen (ADVIL,MOTRIN) 800 MG tablet Take 1 tablet (800 mg total) by mouth 3 (three) times daily. 05/09/15   Richardean Canal, MD  methimazole (TAPAZOLE) 10 MG tablet Take 1 tablet (10 mg total) by mouth daily. 07/05/14   Romero Belling, MD    ALLERGIES:  Allergies  Allergen Reactions  . Benadryl [Diphenhydramine Hcl] Anaphylaxis and Hives  . Chloroxine Hives    Clorox    SOCIAL HISTORY:  Social History  Substance Use Topics  . Smoking status: Current Some Day Smoker -- 0.25 packs/day    Types: Cigarettes  . Smokeless tobacco: Not on file  . Alcohol Use: 0.0  oz/week    0 Glasses of wine, 0 Shots of liquor per week     Comment: occasionally    FAMILY HISTORY: Family History  Problem Relation Age of Onset  . Diabetes Other     Parent  . Cancer Other     Breast Cancer-Parent  . Hypertension Other     Parent  . Hyperlipidemia Other     Parent  . Arthritis Other     Parent    EXAM: BP 174/103 mmHg  Pulse 82  Temp(Src) 98.5 F (36.9 C) (Oral)  Resp 18  Ht  (1.753 m)  Wt 213 lb (96.616 kg)  BMI 31.44 kg/m2  SpO2 100%  LMP 11/23/2015 CONSTITUTIONAL: Alert and oriented and responds appropriately to questions. Well-appearing; well-nourished HEAD: Normocephalic EYES: Conjunctivae clear, PERRL ENT: normal nose; no rhinorrhea; moist mucous membranes; No pharyngeal erythema or petechiae, no tonsillar hypertrophy or exudate, no uvular deviation, no trismus or drooling, normal phonation, no stridor, no dental caries or abscess noted, no Ludwig's angina, tongue sits flat in the bottom of the mouth; TMs are clear bilaterally without erythema, purulence, bulging, perforation, effusion.  No cerumen impaction or sign of foreign body in the external auditory canal. No inflammation, erythema or drainage from the external auditory canal. No signs of mastoiditis. No pain with manipulation of the pinna bilaterally. NECK: Supple, no meningismus, no LAD; no thyromegaly appreciated on exam CARD: RRR; S1 and S2 appreciated; no murmurs, no clicks,  no rubs, no gallops RESP: Normal chest excursion without splinting or tachypnea; breath sounds clear and equal bilaterally; no wheezes, no rhonchi, no rales, no hypoxia or respiratory distress, speaking full sentences ABD/GI: Normal bowel sounds; non-distended; soft, non-tender, no rebound, no guarding, no peritoneal signs BACK:  The back appears normal and is non-tender to palpation, there is no CVA tenderness EXT: Normal ROM in all joints; non-tender to palpation; no edema; normal capillary refill; no cyanosis,  no calf tenderness or swelling    SKIN: Normal color for age and race; warm; no rash NEURO: Moves all extremities equally, sensation to light touch intact diffusely, cranial nerves II through XII intact PSYCH: The patient's mood and manner are appropriate. Grooming and personal hygiene are appropriate.  MEDICAL DECISION MAKING: Patient here with flulike symptoms. She is outside treatment window for Tamiflu. Her lungs are clear to auscultation and she is afebrile. Has mostly a dry cough. Have offered her a chest x-ray which I feel be normal and she declines. I feel this is reasonable. Recommended supportive care including alternating Tylenol and ibuprofen, rest, increase fluid intake. No sign of meningitis, pneumonia, deep space neck infection, peritonsillar abscess, otitis media, otitis externa, mastoiditis, pharyngitis on exam. Have advised her to continue her Zyrtec and to use Flonase daily. Have advised her to avoid phenylephrine and pseudoephedrine given her hyperthyroidism. She does not appear to be in thyroid storm. No fever, tachycardia, confusion, diaphoresis. She is mildly hypertensive and no longer on her methimazole. I have provided her with primary care physician and told her that she should follow-up closely with them. No thyromegaly appreciated on exam.  I do not feel an about excellent be helpful either feel this is a viral illness. Discussed return precautions. Provided with work note.   At this time, I do not feel there is any life-threatening condition present. I have reviewed and discussed all results (EKG, imaging, lab, urine as appropriate), exam findings with patient. I have reviewed nursing notes and appropriate previous records.  I feel the patient is safe to be discharged home without further emergent workup. Discussed usual and customary return precautions. Patient and family (if present) verbalize understanding and are comfortable with this plan.  Patient will follow-up with their  primary care provider. If they do not have a primary care provider, information for follow-up has been provided to them. All questions have been answered.        Layla MawKristen N Jaylee Lantry, DO 11/26/15 [id number]

## 2016-05-17 ENCOUNTER — Encounter (HOSPITAL_BASED_OUTPATIENT_CLINIC_OR_DEPARTMENT_OTHER): Payer: Self-pay | Admitting: Emergency Medicine

## 2016-05-17 ENCOUNTER — Emergency Department (HOSPITAL_BASED_OUTPATIENT_CLINIC_OR_DEPARTMENT_OTHER): Payer: Self-pay

## 2016-05-17 ENCOUNTER — Emergency Department (HOSPITAL_BASED_OUTPATIENT_CLINIC_OR_DEPARTMENT_OTHER)
Admission: EM | Admit: 2016-05-17 | Discharge: 2016-05-17 | Disposition: A | Discharge status: Home / Self Care | Payer: Self-pay | Attending: Emergency Medicine | Admitting: Emergency Medicine

## 2016-05-17 DIAGNOSIS — E039 Hypothyroidism, unspecified: Secondary | ICD-10-CM | POA: Insufficient documentation

## 2016-05-17 DIAGNOSIS — R0789 Other chest pain: Secondary | ICD-10-CM

## 2016-05-17 DIAGNOSIS — F17210 Nicotine dependence, cigarettes, uncomplicated: Secondary | ICD-10-CM | POA: Insufficient documentation

## 2016-05-17 DIAGNOSIS — R072 Precordial pain: Secondary | ICD-10-CM | POA: Insufficient documentation

## 2016-05-17 DIAGNOSIS — I10 Essential (primary) hypertension: Secondary | ICD-10-CM | POA: Insufficient documentation

## 2016-05-17 DIAGNOSIS — Z79899 Other long term (current) drug therapy: Secondary | ICD-10-CM | POA: Insufficient documentation

## 2016-05-17 HISTORY — DX: Essential (primary) hypertension: I10

## 2016-05-17 NOTE — ED Provider Notes (Signed)
[facility] Provider Note   CSN: [id number] Arrival date & time: 05/17/16  1200     History   Chief Complaint Chief Complaint  Patient presents with  . Chest Pain    HPI Kellie Shaffer is a 25 y.o. female.  HPI   25 year old female presents today with complaints of chest pain. Patient reports that she woke up this morning and had soreness in her sternal region. She reports symptoms are worse with palpation, deep inspiration. She denies any exertional component to this. She reports yesterday she was doing heavy lifting helping a friend move. She denies any abdominal pain, indigestion, shortness of breath, cough, upper respiratory congestion, dizziness or lightheadedness. Patient denies any history of the same. She denies any significant cardiac history, denies any history of DVT or PE. She denies any lower extremity swelling or edema, reports she is a smoker, not on birth control, no recent surgeries or recent travel or any other significant risk factors for pulmonary embolism. Patient has not tried medication prior to arrival. Patient reports she was at work was having pain with lifting heavy equipment and was told to be checked out by her boss.  Past Medical History:  Diagnosis Date  . Abortion May 2013  . Anemia   . Atrial fibrillation (HCC)   . Hypercholesteremia   . Hypertension   . Hyperthyroidism   . Ovarian cyst     Patient Active Problem List   Diagnosis Date Noted  . Atrial flutter (HCC) 07/05/2014  . Hyperthyroidism 07/05/2014  . Thrombocytopenia (HCC) 05/27/2014  . Acute appendicitis, uncomplicated 05/26/2014  . Hypercholesteremia     History reviewed. No pertinent surgical history.  OB History    Gravida Para Term Preterm AB Living   1             SAB TAB Ectopic Multiple Live Births                  Home Medications    Prior to Admission medications   Medication Sig Start Date End Date Taking? Authorizing Provider  cetirizine  (ZYRTEC) 10 MG tablet Take 10 mg by mouth daily.   Yes Historical Provider, MD  fluticasone (FLONASE) 50 MCG/ACT nasal spray Place 1 spray into both nostrils daily. 11/26/15   Kristen N Ward, DO  guaiFENesin-codeine 100-10 MG/5ML syrup Take 10 mLs by mouth every 6 (six) hours as needed for cough. 11/26/15   Kristen N Ward, DO  ibuprofen (ADVIL,MOTRIN) 800 MG tablet Take 1 tablet (800 mg total) by mouth 3 (three) times daily. 05/09/15   Charlynne Panderavid Hsienta Yao, MD    Family History Family History  Problem Relation Age of Onset  . Diabetes Other     Parent  . Cancer Other     Breast Cancer-Parent  . Hypertension Other     Parent  . Hyperlipidemia Other     Parent  . Arthritis Other     Parent    Social History Social History  Substance Use Topics  . Smoking status: Current Every Day Smoker    Packs/day: 0.25    Types: Cigarettes  . Smokeless tobacco: Never Used  . Alcohol use 0.0 oz/week     Comment: occasionally     Allergies   Benadryl [diphenhydramine hcl] and Chloroxine   Review of Systems Review of Systems  All other systems reviewed and are negative.  Physical Exam Updated Vital Signs BP (!) 170/108   Pulse 71   Temp 98.1 F (36.7  C) (Oral)   Resp 19   Ht 5\' 9"  (1.753 m)   Wt 99.5 kg   LMP 05/09/2016   SpO2 100%   BMI 32.40 kg/m   Physical Exam  Constitutional: She is oriented to person, place, and time. She appears well-developed and well-nourished.  HENT:  Head: Normocephalic and atraumatic.  Eyes: Conjunctivae are normal. Pupils are equal, round, and reactive to light. Right eye exhibits no discharge. Left eye exhibits no discharge. No scleral icterus.  Neck: Normal range of motion. No JVD present. No tracheal deviation present.  Pulmonary/Chest: Effort normal. No stridor.  Neurological: She is alert and oriented to person, place, and time. Coordination normal.  Psychiatric: She has a normal mood and affect. Her behavior is normal. Judgment and thought  content normal.  Nursing note and vitals reviewed.    ED Treatments / Results    Labs (all labs ordered are listed, but only abnormal results are displayed) Labs Reviewed - No data to display  EKG  EKG Interpretation  Date/Time:  Sunday May 17 2016 12:09:01 EDT Ventricular Rate:  77 PR Interval:  156 QRS Duration: 96 QT Interval:  412 QTC Calculation: 466 R Axis:   66 Text Interpretation:  Normal sinus rhythm Nonspecific T wave abnormality Prolonged QT Abnormal ECG No significant change since last tracing Confirmed by Karma Ganja  MD, MARTHA [phone]) on 05/17/2016 12:14:11 PM Also confirmed by Karma Ganja  MD, MARTHA [phone]), editor Valentina Lucks CT, Jola Babinski [phone])  on 05/17/2016 12:22:00 PM      Radiology Dg Chest 2 View  Result Date: 05/17/2016 CLINICAL DATA:  25 year old female with history of midsternal chest pain within inspiration. No shortness of breath. EXAM: CHEST  2 VIEW COMPARISON:  Chest x-ray a 05/09/2015. FINDINGS: Lung volumes are normal. No consolidative airspace disease. No pleural effusions. No pneumothorax. No pulmonary nodule or mass noted. Pulmonary vasculature and the cardiomediastinal silhouette are within normal limits. IMPRESSION: No radiographic evidence of acute cardiopulmonary disease. Electronically Signed   By: Trudie Reed M.D.   On: 05/17/2016 15:09    Procedures Procedures (including critical care time)    Medications Ordered in ED Medications - No data to display   Initial Impression / Assessment and Plan / ED Course  I have reviewed the triage vital signs and the nursing notes.  Pertinent labs & imaging results that were available during my care of the patient were reviewed by me and considered in my medical decision making (see chart for details).  Clinical Course     Final Clinical Impressions(s) / ED Diagnoses   Final diagnoses:  Sternal pain    Labs:  Imaging:  Consults:  Therapeutics:  Discharge Meds:   Assessment/Plan:    25 year old female presents today with complaints of chest pain. Patient's pain in the sternum, significantly tenderness palpation, she has no significant signs or symptoms consistent with ACS, PE, or infectious etiology. Patients pain likely musculoskeletal, ibuprofen and Tylenol. Patient was noted to be hypertensive here in the ED, chart review shows she's been hypertensive during previous ED evaluations, she has follow-up with primary care for this. No signs of end organ damage. Strict return precautions given.    New Prescriptions Discharge Medication List as of 05/17/2016  3:37 PM       Eyvonne Mechanic, PA-C 05/17/16 1716    Jerelyn Scott, MD 05/20/16 [phone]

## 2016-05-17 NOTE — Discharge Instructions (Signed)
Please read attached information. If you experience any new or worsening signs or symptoms please return to the emergency room for evaluation. Please follow-up with your primary care provider or specialist as discussed.  °

## 2016-05-17 NOTE — ED Notes (Signed)
pT GIVEN D/C INSTRUCTIONS AS PER CHART. vERBALIZES UNDERSTANDING. nO QUESTIONS. 

## 2016-05-17 NOTE — ED Notes (Signed)
Pt states I took tylenol at 5 am for the pain and then at 8 am I took excerdrin migraine for the pain they relieved the back pain but did not relieve the chest pain.

## 2016-05-17 NOTE — ED Triage Notes (Signed)
Pt woke up at approx 5am this morning to use bathroom and while walking noticed a sharp pain in her central chest radiating to back.

## 2016-05-17 NOTE — ED Notes (Signed)
ED PA at bedside

## 2016-05-18 ENCOUNTER — Emergency Department (HOSPITAL_COMMUNITY)
Admission: EM | Admit: 2016-05-18 | Discharge: 2016-05-18 | Disposition: A | Discharge status: Home / Self Care | Payer: Self-pay | Attending: Emergency Medicine | Admitting: Emergency Medicine

## 2016-05-18 ENCOUNTER — Encounter (HOSPITAL_COMMUNITY): Payer: Self-pay | Admitting: Emergency Medicine

## 2016-05-18 ENCOUNTER — Emergency Department (HOSPITAL_COMMUNITY): Payer: Self-pay

## 2016-05-18 DIAGNOSIS — I10 Essential (primary) hypertension: Secondary | ICD-10-CM | POA: Insufficient documentation

## 2016-05-18 DIAGNOSIS — Z79899 Other long term (current) drug therapy: Secondary | ICD-10-CM | POA: Insufficient documentation

## 2016-05-18 DIAGNOSIS — R0789 Other chest pain: Secondary | ICD-10-CM | POA: Insufficient documentation

## 2016-05-18 DIAGNOSIS — R079 Chest pain, unspecified: Secondary | ICD-10-CM

## 2016-05-18 DIAGNOSIS — F17210 Nicotine dependence, cigarettes, uncomplicated: Secondary | ICD-10-CM | POA: Insufficient documentation

## 2016-05-18 LAB — CBC
HEMATOCRIT: 33.9 % — AB (ref 36.0–46.0)
Hemoglobin: 11 g/dL — ABNORMAL LOW (ref 12.0–15.0)
MCH: 25.1 pg — ABNORMAL LOW (ref 26.0–34.0)
MCHC: 32.4 g/dL (ref 30.0–36.0)
MCV: 77.4 fL — AB (ref 78.0–100.0)
Platelets: 89 10*3/uL — ABNORMAL LOW (ref 150–400)
RBC: 4.38 MIL/uL (ref 3.87–5.11)
RDW: 14.5 % (ref 11.5–15.5)
WBC: 7.5 10*3/uL (ref 4.0–10.5)

## 2016-05-18 LAB — BASIC METABOLIC PANEL
ANION GAP: 8 (ref 5–15)
BUN: 15 mg/dL (ref 6–20)
CALCIUM: 9.4 mg/dL (ref 8.9–10.3)
CO2: 25 mmol/L (ref 22–32)
CREATININE: 0.65 mg/dL (ref 0.44–1.00)
Chloride: 105 mmol/L (ref 101–111)
GFR calc Af Amer: >60 mL/min (ref >60)
GLUCOSE: 88 mg/dL (ref 65–99)
Potassium: 3.6 mmol/L (ref 3.5–5.1)
Sodium: 138 mmol/L (ref 135–145)

## 2016-05-18 LAB — D-DIMER, QUANTITATIVE: D-Dimer, Quant: 0.47 ug/mL-FEU (ref 0.00–0.50)

## 2016-05-18 LAB — I-STAT TROPONIN, ED: Troponin i, poc: 0.02 ng/mL (ref 0.00–0.08)

## 2016-05-18 MED ORDER — KETOROLAC TROMETHAMINE 30 MG/ML IJ SOLN
30.0000 mg | Freq: Once | INTRAMUSCULAR | Status: AC
  Administered 2016-05-18: 30 mg via INTRAVENOUS
  Filled 2016-05-18: qty 1

## 2016-05-18 MED ORDER — MORPHINE SULFATE (PF) 4 MG/ML IV SOLN
4.0000 mg | Freq: Once | INTRAVENOUS | Status: AC
  Administered 2016-05-18: 4 mg via INTRAVENOUS
  Filled 2016-05-18: qty 1

## 2016-05-18 NOTE — ED Provider Notes (Signed)
[facility]-EMERGENCY DEPT Provider Note   CSN: [id number] Arrival date & time: 05/18/16  7829     History   Chief Complaint Chief Complaint  Patient presents with  . Chest Pain  . Back Pain    HPI Kellie Shaffer is a 25 y.o. female.  HPI Patient presents with anterior chest pain which is sore in nature and worse when she takes a deep breath.  No pain with range of motion of her arms.  No history DVT or pulmonary embolism.  Her pain is mainly present when she takes a deep breath.  No shortness of breath with exertion however.  Denies abdominal pain.  No unilateral leg swelling.  Pain is moderate in severity   Past Medical History:  Diagnosis Date  . Abortion May 2013  . Anemia   . Atrial fibrillation (HCC)   . Hypercholesteremia   . Hypertension   . Hyperthyroidism   . Ovarian cyst     Patient Active Problem List   Diagnosis Date Noted  . Atrial flutter (HCC) 07/05/2014  . Hyperthyroidism 07/05/2014  . Thrombocytopenia (HCC) 05/27/2014  . Acute appendicitis, uncomplicated 05/26/2014  . Hypercholesteremia     History reviewed. No pertinent surgical history.  OB History    Gravida Para Term Preterm AB Living   1             SAB TAB Ectopic Multiple Live Births                   Home Medications    Prior to Admission medications   Medication Sig Start Date End Date Taking? Authorizing Provider  Biotin 56213 MCG TABS Take 1 tablet by mouth daily.    Yes Historical Provider, MD  cetirizine (ZYRTEC) 10 MG tablet Take 10 mg by mouth daily as needed for allergies.    Yes Historical Provider, MD  ibuprofen (ADVIL,MOTRIN) 200 MG tablet Take 600-800 mg by mouth every 4 (four) hours as needed for fever, headache, mild pain, moderate pain or cramping.   Yes Historical Provider, MD    Family History Family History  Problem Relation Age of Onset  . Diabetes Other     Parent  . Cancer Other     Breast Cancer-Parent  . Hypertension Other     Parent  .  Hyperlipidemia Other     Parent  . Arthritis Other     Parent    Social History Social History  Substance Use Topics  . Smoking status: Current Every Day Smoker    Packs/day: 0.25    Types: Cigarettes  . Smokeless tobacco: Never Used  . Alcohol use 0.0 oz/week     Comment: occasionally     Allergies   Benadryl [diphenhydramine hcl] and Chloroxine   Review of Systems Review of Systems  All other systems reviewed and are negative.    Physical Exam Updated Vital Signs BP 155/93 (BP Location: Left Arm)   Pulse 81   Temp 98.1 F (36.7 C) (Oral)   Resp 18   Ht 5\' 9"  (1.753 m)   Wt 219 lb (99.3 kg)   LMP 05/09/2016   SpO2 100%   BMI 32.34 kg/m   Physical Exam  Constitutional: She is oriented to person, place, and time. She appears well-developed and well-nourished. No distress.  HENT:  Head: Normocephalic and atraumatic.  Eyes: EOM are normal.  Neck: Normal range of motion.  Cardiovascular: Normal rate, regular rhythm and normal heart sounds.   Pulmonary/Chest: Effort normal and  breath sounds normal.  Abdominal: Soft. She exhibits no distension. There is no tenderness.  Musculoskeletal: Normal range of motion.  Neurological: She is alert and oriented to person, place, and time.  Skin: Skin is warm and dry.  Psychiatric: She has a normal mood and affect. Judgment normal.  Nursing note and vitals reviewed.    ED Treatments / Results  Labs (all labs ordered are listed, but only abnormal results are displayed) Labs Reviewed  CBC - Abnormal; Notable for the following:       Result Value   Hemoglobin 11.0 (*)    HCT 33.9 (*)    MCV 77.4 (*)    MCH 25.1 (*)    All other components within normal limits  BASIC METABOLIC PANEL  D-DIMER, QUANTITATIVE (NOT AT [facility])  Rosezena SensorI-STAT TROPOININ, ED    EKG  EKG Interpretation  Date/Time:  Monday May 18 2016 08:54:23 EDT Ventricular Rate:  83 PR Interval:    QRS Duration: 108 QT Interval:  409 QTC  Calculation: 481 R Axis:   26 Text Interpretation:  Sinus rhythm RSR' in V1 or V2, probably normal variant Borderline T abnormalities, anterior leads Borderline prolonged QT interval No significant change was found Confirmed by Deavon Podgorski  MD, Caryn BeeKEVIN ([id number]) on 05/18/2016 9:00:22 AM Also confirmed by Patria ManeAMPOS  MD, Caryn BeeKEVIN ([id number]), editor TroskyLOGAN, Cala BradfordKIMBERLY [id number])  on 05/18/2016 9:11:15 AM       Radiology Dg Chest 2 View  Result Date: 05/18/2016 CLINICAL DATA:  Chest pain. EXAM: CHEST  2 VIEW COMPARISON:  05/17/2016. FINDINGS: Cardiomegaly with normal pulmonary vascularity. No focal infiltrate. No pleural effusion or pneumothorax. No acute bony abnormality. IMPRESSION: Cardiomegaly.  No acute pulmonary disease. Electronically Signed   By: [name]  Register   On: 05/18/2016 10:03   Dg Chest 2 View  Result Date: 05/17/2016 CLINICAL DATA:  25 year old female with history of midsternal chest pain within inspiration. No shortness of breath. EXAM: CHEST  2 VIEW COMPARISON:  Chest x-ray a 05/09/2015. FINDINGS: Lung volumes are normal. No consolidative airspace disease. No pleural effusions. No pneumothorax. No pulmonary nodule or mass noted. Pulmonary vasculature and the cardiomediastinal silhouette are within normal limits. IMPRESSION: No radiographic evidence of acute cardiopulmonary disease. Electronically Signed   By: Trudie Reedaniel  Entrikin M.D.   On: 05/17/2016 15:09    Procedures Procedures (including critical care time)  Medications Ordered in ED Medications  ketorolac (TORADOL) 30 MG/ML injection 30 mg (30 mg Intravenous Given 05/18/16 0959)  morphine 4 MG/ML injection 4 mg (4 mg Intravenous Given 05/18/16 1005)     Initial Impression / Assessment and Plan / ED Course  I have reviewed the triage vital signs and the nursing notes.  Pertinent labs & imaging results that were available during my care of the patient were reviewed by me and considered in my medical decision making (see chart for  details).  Clinical Course    EKG, chest x-ray, labs, d-dimer without abnormality.  Likely pleurisy or musculoskeletal pain.  Home with anti-inflammatories.  Final Clinical Impressions(s) / ED Diagnoses   Final diagnoses:  None    New Prescriptions New Prescriptions   No medications on file     Azalia BilisKevin Len Kluver, MD 05/18/16 1041

## 2016-05-18 NOTE — ED Triage Notes (Signed)
Patient c/o central chest pain that hurts in center of back that started yesterday and hurts when inhales.  Patient states that she was seen at [facility] yesterday and states was told pull muscle and to take ibuprofen. Patient reports that ibuprofen isnt helping with the pain.

## 2016-05-18 NOTE — ED Notes (Signed)
Pt. C/o chest pain in center of chest when inhales. Pt. States she has a history of AFib.

## 2016-08-26 ENCOUNTER — Emergency Department (HOSPITAL_COMMUNITY)
Admission: EM | Admit: 2016-08-26 | Discharge: 2016-08-27 | Disposition: A | Discharge status: Home / Self Care | Payer: Self-pay | Attending: Emergency Medicine | Admitting: Emergency Medicine

## 2016-08-26 ENCOUNTER — Encounter (HOSPITAL_COMMUNITY): Payer: Self-pay | Admitting: *Deleted

## 2016-08-26 DIAGNOSIS — E876 Hypokalemia: Secondary | ICD-10-CM | POA: Insufficient documentation

## 2016-08-26 DIAGNOSIS — I10 Essential (primary) hypertension: Secondary | ICD-10-CM | POA: Insufficient documentation

## 2016-08-26 DIAGNOSIS — D696 Thrombocytopenia, unspecified: Secondary | ICD-10-CM | POA: Insufficient documentation

## 2016-08-26 DIAGNOSIS — F17210 Nicotine dependence, cigarettes, uncomplicated: Secondary | ICD-10-CM | POA: Insufficient documentation

## 2016-08-26 DIAGNOSIS — Z79899 Other long term (current) drug therapy: Secondary | ICD-10-CM | POA: Insufficient documentation

## 2016-08-26 DIAGNOSIS — R52 Pain, unspecified: Secondary | ICD-10-CM

## 2016-08-26 DIAGNOSIS — R197 Diarrhea, unspecified: Secondary | ICD-10-CM | POA: Insufficient documentation

## 2016-08-26 MED ORDER — SODIUM CHLORIDE 0.9 % IV BOLUS (SEPSIS)
1000.0000 mL | Freq: Once | INTRAVENOUS | Status: AC
  Administered 2016-08-26: 1000 mL via INTRAVENOUS

## 2016-08-26 MED ORDER — ACETAMINOPHEN 500 MG PO TABS
1000.0000 mg | ORAL_TABLET | Freq: Once | ORAL | Status: AC
  Administered 2016-08-26: 1000 mg via ORAL
  Filled 2016-08-26: qty 2

## 2016-08-26 MED ORDER — ONDANSETRON HCL 4 MG/2ML IJ SOLN
4.0000 mg | Freq: Once | INTRAMUSCULAR | Status: AC
  Administered 2016-08-26: 4 mg via INTRAVENOUS
  Filled 2016-08-26: qty 2

## 2016-08-26 NOTE — ED Triage Notes (Signed)
Pt c/o generalized body aches x 2 days with some diarrhea

## 2016-08-26 NOTE — ED Provider Notes (Signed)
[facility]-EMERGENCY DEPT Provider Note   CSN: [id number] Arrival date & time: 08/26/16  2152 By signing my name below, I, Levon Hedger, attest that this documentation has been prepared under the direction and in the presence of non-physician practitioner, Everlene Farrier, PA-C. Electronically Signed: Levon Hedger, Scribe. 08/26/2016. 11:04 PM.   History   Chief Complaint Chief Complaint  Patient presents with  . Generalized Body Aches    HPI Kellie Shaffer is a 26 y.o. female with a hx of anemia, ITP, and HTN who presents to the Emergency Department complaining of gradual onset, moderate generalized body aches onset two days ago. She reports associated nausea, intermittent diarrhea, decreased appetite, and fever (tmax 101.3) last night. She states she has been unable to eat for three days due to the nausea and decreased appetite. Pt states she has not had a fever or diarrhea today. She has taken tylenol cold and flu today with some relief or symptoms; she last took medication at noon today. No exacerbating factors noted. No sick contacts. No abdominal PSHx. She denies easy bruising or bleeding. She denies bleeding gums, nosebleeds, hematuria or vaginal bleeding. Pt denies any vomiting, abdominal pain, polyuria, dysuria, hematuria, urinary frequency, vaginal bleeding or discharge, cough, SOB, CP, wheezing, sore throat, rhinorrhea, congestion, or otalgia.   The history is provided by the patient. No language interpreter was used.    Past Medical History:  Diagnosis Date  . Abortion May 2013  . Anemia   . Atrial fibrillation (HCC)   . Hypercholesteremia   . Hypertension   . Hyperthyroidism   . Ovarian cyst     Patient Active Problem List   Diagnosis Date Noted  . Atrial flutter (HCC) 07/05/2014  . Hyperthyroidism 07/05/2014  . Thrombocytopenia (HCC) 05/27/2014  . Acute appendicitis, uncomplicated 05/26/2014  . Hypercholesteremia     History reviewed. No pertinent surgical  history.  OB History    Gravida Para Term Preterm AB Living   1             SAB TAB Ectopic Multiple Live Births                  Home Medications    Prior to Admission medications   Medication Sig Start Date End Date Taking? Authorizing Provider  Biotin 69629 MCG TABS Take 1 tablet by mouth daily.    Yes Historical Provider, MD  cetirizine (ZYRTEC) 10 MG tablet Take 10 mg by mouth daily as needed for allergies.    Yes Historical Provider, MD  ibuprofen (ADVIL,MOTRIN) 200 MG tablet Take 600-800 mg by mouth every 4 (four) hours as needed for fever, headache, mild pain, moderate pain or cramping.   Yes Historical Provider, MD  Phenylephrine-DM-GG-APAP (TYLENOL COLD/FLU SEVERE PO) Take 30 mLs by mouth daily as needed (cold).   Yes Historical Provider, MD  ondansetron (ZOFRAN ODT) 4 MG disintegrating tablet Take 1 tablet (4 mg total) by mouth every 8 (eight) hours as needed for nausea or vomiting. 08/27/16   Everlene Farrier, PA-C  predniSONE (STERAPRED UNI-PAK 21 TAB) 10 MG (21) TBPK tablet Take 1 tablet (10 mg total) by mouth daily. Take 6 tabs by mouth daily  for 2 days, then 5 tabs for 2 days, then 4 tabs for 2 days, then 3 tabs for 2 days, 2 tabs for 2 days, then 1 tab by mouth daily for 2 days 08/27/16   Everlene Farrier, PA-C    Family History Family History  Problem Relation Age of Onset  .  Diabetes Other     Parent  . Cancer Other     Breast Cancer-Parent  . Hypertension Other     Parent  . Hyperlipidemia Other     Parent  . Arthritis Other     Parent    Social History Social History  Substance Use Topics  . Smoking status: Current Every Day Smoker    Packs/day: 0.25    Types: Cigarettes  . Smokeless tobacco: Never Used  . Alcohol use 0.0 oz/week     Comment: occasionally    Allergies   Benadryl [diphenhydramine hcl] and Chloroxine   Review of Systems Review of Systems  Constitutional: Positive for appetite change and fever.  HENT: Negative for ear pain,  nosebleeds, rhinorrhea and sore throat.   Eyes: Negative for visual disturbance.  Respiratory: Negative for cough, shortness of breath and wheezing.   Cardiovascular: Negative for chest pain.  Gastrointestinal: Positive for diarrhea and nausea. Negative for abdominal pain, blood in stool and vomiting.  Endocrine: Negative for polyuria.  Genitourinary: Negative for dysuria, frequency, hematuria, urgency, vaginal bleeding and vaginal discharge.  Musculoskeletal: Positive for myalgias. Negative for back pain and neck pain.  Skin: Negative for rash.  Neurological: Negative for dizziness, syncope, weakness, light-headedness and numbness.   Physical Exam Updated Vital Signs BP 166/94   Pulse 93   Temp 98.4 F (36.9 C) (Oral)   Resp 20   Ht 5\' 9"  (1.753 m)   Wt 100.5 kg   LMP 08/04/2016   SpO2 100%   BMI 32.72 kg/m   Physical Exam  Constitutional: She is oriented to person, place, and time. She appears well-developed and well-nourished. No distress.  Nontoxic appearing.  HENT:  Head: Normocephalic and atraumatic.  Mouth/Throat: Oropharynx is clear and moist.  Eyes: Conjunctivae are normal. Pupils are equal, round, and reactive to light. Right eye exhibits no discharge. Left eye exhibits no discharge.  Neck: Neck supple.  Cardiovascular: Normal rate, regular rhythm, normal heart sounds and intact distal pulses.  Exam reveals no gallop and no friction rub.   No murmur heard. Pulmonary/Chest: Effort normal and breath sounds normal. No respiratory distress. She has no wheezes. She has no rales.  Lungs clear auscultation bilaterally. No increased work of breathing. No rales or rhonchi.  Abdominal: Soft. Bowel sounds are normal. She exhibits no distension and no mass. There is no tenderness. There is no guarding and no CVA tenderness.  Abdomen is soft and nontender to palpation.  Musculoskeletal: She exhibits no edema or tenderness.  No lower extremity edema or tenderness.    Lymphadenopathy:    She has no cervical adenopathy.  Neurological: She is alert and oriented to person, place, and time. She exhibits normal muscle tone. Coordination normal.  Skin: Skin is warm and dry. Capillary refill takes less than 2 seconds. No rash noted. She is not diaphoretic. No erythema. No pallor.  Psychiatric: She has a normal mood and affect. Her behavior is normal.  Nursing note and vitals reviewed.  ED Treatments / Results  DIAGNOSTIC STUDIES:  Oxygen Saturation is 100% on RA, normal by my interpretation.    COORDINATION OF CARE:  11:00 PM Discussed treatment plan with pt at bedside and pt agreed to plan.   Labs (all labs ordered are listed, but only abnormal results are displayed) Labs Reviewed  CBC WITH DIFFERENTIAL/PLATELET - Abnormal; Notable for the following:       Result Value   Hemoglobin 11.5 (*)    HCT 35.0 (*)  MCV 76.6 (*)    MCH 25.2 (*)    Platelets 26 (*)    All other components within normal limits  COMPREHENSIVE METABOLIC PANEL - Abnormal; Notable for the following:    Potassium 2.7 (*)    Calcium 8.1 (*)    All other components within normal limits  I-STAT BETA HCG BLOOD, ED ([facility], [facility], [facility] ONLY)    EKG  EKG Interpretation None       Radiology No results found.  Procedures Procedures (including critical care time)  Medications Ordered in ED Medications  dextrose 5 % and 0.45 % NaCl with KCl 40 mEq/L infusion (not administered)  potassium chloride SA (K-DUR,KLOR-CON) CR tablet 40 mEq (not administered)  sodium chloride 0.9 % bolus 1,000 mL (1,000 mLs Intravenous New Bag/Given 08/26/16 2338)  ondansetron (ZOFRAN) injection 4 mg (4 mg Intravenous Given 08/26/16 2339)  acetaminophen (TYLENOL) tablet 1,000 mg (1,000 mg Oral Given 08/26/16 2339)     Initial Impression / Assessment and Plan / ED Course  I have reviewed the triage vital signs and the nursing notes.  Pertinent labs & imaging results that were available during my care  of the patient were reviewed by me and considered in my medical decision making (see chart for details).  Clinical Course    This is a 26 y.o. female with a hx of anemia, ITP, and HTN who presents to the Emergency Department complaining of gradual onset, moderate generalized body aches onset two days ago. She reports associated nausea, intermittent diarrhea, decreased appetite, and fever (tmax 101.3) last night. She states she has been unable to eat for three days due to the nausea and decreased appetite. Pt states she has not had a fever or diarrhea today. She has taken tylenol cold and flu today with some relief or symptoms; she last took medication at noon today. No exacerbating factors noted. No sick contacts. No abdominal PSHx. She denies easy bruising or bleeding. She denies bleeding gums, nosebleeds, hematuria or vaginal bleeding. Pt denies any vomiting, abdominal pain. On exam the patient is afebrile nontoxic appearing. Her abdomen is soft and nontender to palpation. Lungs are clear auscultation bilaterally. Mucous membranes are moist. Pregnancy test is negative. CMP is remarkable for a potassium of 2.7 and is otherwise unremarkable. This is likely due to the patient's diarrhea over the past several days. No diarrhea today. No vomiting. CBC is remarkable for a platelet count of 26,000. Thrombus cytopenia. Patient has a history of ITP. Hemoglobin is 11.5. She denies any active bleeding. She denies any recent bleeding. She denies vaginal bleeding, bleeding gums, nosebleeds or easy bruising. 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 She is previously on steroids. She reports this helped with her platelet count in the past. Will restart her on steroids. Patient provided with potassium IV and oral potassium. It should change patient is awaiting EKG, IV potassium and oral potassium. Patient care signed out to Dr. Bebe Shaggy who will reevaluate the patient and plan is for  discharge after IV potassium and if patient is tolerating PO. I advised her to restart prednisone and provide her with a prescription of a steroid taper. I encouraged her to follow-up with her hematologist Dr. Shirline Frees as soon as possible. Patient agrees to plan.   This patient was discussed with Dr. Bebe Shaggy who agrees with assessment and plan.   Final Clinical Impressions(s) / ED Diagnoses   Final diagnoses:  Thrombocytopenia (HCC)  Body aches  Hypokalemia  Diarrhea, unspecified type    New Prescriptions New Prescriptions   ONDANSETRON (ZOFRAN ODT) 4 MG DISINTEGRATING TABLET    Take 1 tablet (4 mg total) by mouth every 8 (eight) hours as needed for nausea or vomiting.   PREDNISONE (STERAPRED UNI-PAK 21 TAB) 10 MG (21) TBPK TABLET    Take 1 tablet (10 mg total) by mouth daily. Take 6 tabs by mouth daily  for 2 days, then 5 tabs for 2 days, then 4 tabs for 2 days, then 3 tabs for 2 days, 2 tabs for 2 days, then 1 tab by mouth daily for 2 days   I personally performed the services described in this documentation, which was scribed in my presence. The recorded information has been reviewed and is accurate.      Everlene FarrierWilliam Adrienne Trombetta, PA-C 08/27/16 0111    Zadie Rhineonald Wickline, MD 08/27/16 [phone]

## 2016-08-27 ENCOUNTER — Telehealth: Payer: Self-pay | Admitting: *Deleted

## 2016-08-27 LAB — COMPREHENSIVE METABOLIC PANEL
ALK PHOS: 40 U/L (ref 38–126)
ALT: 15 U/L (ref 14–54)
ANION GAP: 6 (ref 5–15)
AST: 15 U/L (ref 15–41)
Albumin: 3.7 g/dL (ref 3.5–5.0)
BILIRUBIN TOTAL: 0.5 mg/dL (ref 0.3–1.2)
BUN: 10 mg/dL (ref 6–20)
CALCIUM: 8.1 mg/dL — AB (ref 8.9–10.3)
CO2: 24 mmol/L (ref 22–32)
Chloride: 108 mmol/L (ref 101–111)
Creatinine, Ser: 0.53 mg/dL (ref 0.44–1.00)
GFR calc non Af Amer: >60 mL/min (ref >60)
Glucose, Bld: 86 mg/dL (ref 65–99)
Potassium: 2.7 mmol/L — CL (ref 3.5–5.1)
Sodium: 138 mmol/L (ref 135–145)
TOTAL PROTEIN: 7.3 g/dL (ref 6.5–8.1)

## 2016-08-27 LAB — CBC WITH DIFFERENTIAL/PLATELET
Basophils Absolute: 0 10*3/uL (ref 0.0–0.1)
Basophils Relative: 0 %
Eosinophils Absolute: 0.1 10*3/uL (ref 0.0–0.7)
Eosinophils Relative: 1 %
HEMATOCRIT: 35 % — AB (ref 36.0–46.0)
HEMOGLOBIN: 11.5 g/dL — AB (ref 12.0–15.0)
LYMPHS PCT: 32 %
Lymphs Abs: 2.2 10*3/uL (ref 0.7–4.0)
MCH: 25.2 pg — AB (ref 26.0–34.0)
MCHC: 32.9 g/dL (ref 30.0–36.0)
MCV: 76.6 fL — ABNORMAL LOW (ref 78.0–100.0)
MONO ABS: 0.7 10*3/uL (ref 0.1–1.0)
MONOS PCT: 10 %
NEUTROS ABS: 4 10*3/uL (ref 1.7–7.7)
Neutrophils Relative %: 57 %
Platelets: 26 10*3/uL — CL (ref 150–400)
RBC: 4.57 MIL/uL (ref 3.87–5.11)
RDW: 15 % (ref 11.5–15.5)
Smear Review: DECREASED
WBC: 7 10*3/uL (ref 4.0–10.5)

## 2016-08-27 LAB — I-STAT BETA HCG BLOOD, ED (MC, WL, AP ONLY): I-stat hCG, quantitative: <5 m[IU]/mL (ref <5)

## 2016-08-27 LAB — POTASSIUM: Potassium: 3.3 mmol/L — ABNORMAL LOW (ref 3.5–5.1)

## 2016-08-27 MED ORDER — ONDANSETRON 4 MG PO TBDP: 4.0000 mg | ORAL_TABLET | Freq: Three times a day (TID) | ORAL | 0 refills | Status: DC | PRN

## 2016-08-27 MED ORDER — POTASSIUM CHLORIDE CRYS ER 20 MEQ PO TBCR
40.0000 meq | EXTENDED_RELEASE_TABLET | Freq: Once | ORAL | Status: AC
  Administered 2016-08-27: 40 meq via ORAL
  Filled 2016-08-27: qty 2

## 2016-08-27 MED ORDER — KCL IN DEXTROSE-NACL 40-5-0.45 MEQ/L-%-% IV SOLN
INTRAVENOUS | Status: DC
  Administered 2016-08-27: 02:00:00 via INTRAVENOUS
  Filled 2016-08-27: qty 1000

## 2016-08-27 MED ORDER — PREDNISONE 10 MG (21) PO TBPK: 10.0000 mg | ORAL_TABLET | Freq: Every day | ORAL | 0 refills | Status: DC

## 2016-08-27 NOTE — ED Provider Notes (Signed)
Repeat potassium improved Pt stable Will d/c home   Zadie Rhineonald Tenzin Pavon, MD 08/27/16 [phone]

## 2016-08-27 NOTE — Telephone Encounter (Signed)
Recent ED visit, pt was instructed to follow up with Dr. Arbutus PedMohamed. Last seen 07/05/2014. Will review with MD regarding pt concerns.

## 2016-08-27 NOTE — ED Notes (Signed)
CRITICAL VALUE ALERT  Critical value received:  K+ 2.7  Date of notification:  08/27/16  Time of notification:  0029  Critical value read back: yes  Nurse who received alert:  Wynona Dovehris Sutter Ahlgren  MD notified (1st page):  danise  Time of first page:  0029  MD notified (2nd page):  Time of second page:  Responding MD:  danise  Time MD responded:  [phone]

## 2016-08-27 NOTE — ED Notes (Signed)
CRITICAL VALUE ALERT  Critical value received:  plt 26  Date of notification:  08/27/16  Time of notification:  0007 Critical value read back: yes  Nurse who received alert:  Wynona Dovehris Franchon Ketterman  MD notified (1st page): dansie  Time of first page:  0007  MD notified (2nd page): dansie  Time of second page: 0007  Responding MD:  danise  Time MD responded:  0007

## 2016-08-31 ENCOUNTER — Telehealth: Payer: Self-pay | Admitting: Internal Medicine

## 2016-08-31 NOTE — Telephone Encounter (Signed)
Patient was recently discharged for the hospital and was told to follow up with Dr Arbutus PedMohamed.  She called on Thursday and she stated that someone call her back and she hasn't heard anything  Kellie Shaffer DOB 10-14-1990 [phone]

## 2016-09-01 ENCOUNTER — Telehealth: Payer: Self-pay | Admitting: Internal Medicine

## 2016-09-01 NOTE — Telephone Encounter (Signed)
lvm to inform pt of 11/9 appt date/time per LOS °

## 2016-09-03 ENCOUNTER — Other Ambulatory Visit: Payer: Self-pay | Admitting: Medical Oncology

## 2016-09-03 DIAGNOSIS — D696 Thrombocytopenia, unspecified: Secondary | ICD-10-CM

## 2016-09-04 ENCOUNTER — Telehealth: Payer: Self-pay | Admitting: Medical Oncology

## 2016-09-04 ENCOUNTER — Telehealth: Payer: Self-pay | Admitting: Internal Medicine

## 2016-09-04 ENCOUNTER — Ambulatory Visit (HOSPITAL_BASED_OUTPATIENT_CLINIC_OR_DEPARTMENT_OTHER): Payer: Self-pay | Admitting: Medical Oncology

## 2016-09-04 ENCOUNTER — Ambulatory Visit (HOSPITAL_BASED_OUTPATIENT_CLINIC_OR_DEPARTMENT_OTHER): Payer: Self-pay | Admitting: Internal Medicine

## 2016-09-04 ENCOUNTER — Encounter: Payer: Self-pay | Admitting: Internal Medicine

## 2016-09-04 VITALS — BP 148/87 | HR 67 | Temp 98.4°F | Resp 18 | Ht 69.0 in | Wt 225.1 lb

## 2016-09-04 DIAGNOSIS — D696 Thrombocytopenia, unspecified: Secondary | ICD-10-CM

## 2016-09-04 DIAGNOSIS — D508 Other iron deficiency anemias: Secondary | ICD-10-CM

## 2016-09-04 DIAGNOSIS — D509 Iron deficiency anemia, unspecified: Secondary | ICD-10-CM

## 2016-09-04 DIAGNOSIS — I10 Essential (primary) hypertension: Secondary | ICD-10-CM

## 2016-09-04 DIAGNOSIS — E0590 Thyrotoxicosis, unspecified without thyrotoxic crisis or storm: Secondary | ICD-10-CM

## 2016-09-04 DIAGNOSIS — D693 Immune thrombocytopenic purpura: Secondary | ICD-10-CM

## 2016-09-04 HISTORY — DX: Iron deficiency anemia, unspecified: D50.9

## 2016-09-04 LAB — CBC WITH DIFFERENTIAL/PLATELET
BASO%: 0.1 % (ref 0.0–2.0)
Basophils Absolute: 0 10*3/uL (ref 0.0–0.1)
EOS%: 1.1 % (ref 0.0–7.0)
Eosinophils Absolute: 0.2 10*3/uL (ref 0.0–0.5)
HCT: 33.6 % — ABNORMAL LOW (ref 34.8–46.6)
HGB: 10.6 g/dL — ABNORMAL LOW (ref 11.6–15.9)
LYMPH%: 39.2 % (ref 14.0–49.7)
MCH: 24.3 pg — ABNORMAL LOW (ref 25.1–34.0)
MCHC: 31.5 g/dL (ref 31.5–36.0)
MCV: 76.9 fL — ABNORMAL LOW (ref 79.5–101.0)
MONO#: 1.3 10*3/uL — ABNORMAL HIGH (ref 0.1–0.9)
MONO%: 7.8 % (ref 0.0–14.0)
NEUT#: 8.7 10*3/uL — ABNORMAL HIGH (ref 1.5–6.5)
NEUT%: 51.8 % (ref 38.4–76.8)
PLATELETS: 203 10*3/uL (ref 145–400)
RBC: 4.37 10*6/uL (ref 3.70–5.45)
RDW: 15.7 % — ABNORMAL HIGH (ref 11.2–14.5)
WBC: 16.9 10*3/uL — ABNORMAL HIGH (ref 3.9–10.3)
lymph#: 6.6 10*3/uL — ABNORMAL HIGH (ref 0.9–3.3)

## 2016-09-04 MED ORDER — AMLODIPINE BESYLATE 5 MG PO TABS: 5.0000 mg | ORAL_TABLET | Freq: Every day | ORAL | 0 refills | Status: DC

## 2016-09-04 MED ORDER — CLONIDINE HCL 0.1 MG PO TABS
0.2000 mg | ORAL_TABLET | Freq: Once | ORAL | Status: AC
  Administered 2016-09-04: 0.2 mg via ORAL

## 2016-09-04 MED ORDER — CLONIDINE HCL 0.1 MG PO TABS
ORAL_TABLET | ORAL | Status: AC
  Filled 2016-09-04: qty 2

## 2016-09-04 NOTE — Patient Instructions (Signed)
Steps to Quit Smoking Smoking tobacco can be bad for your health. It can also affect almost every organ in your body. Smoking puts you and people around you at risk for many serious long-lasting (chronic) diseases. Quitting smoking is hard, but it is one of the best things that you can do for your health. It is never too late to quit. What are the benefits of quitting smoking? When you quit smoking, you lower your risk for getting serious diseases and conditions. They can include:  Lung cancer or lung disease.  Heart disease.  Stroke.  Heart attack.  Not being able to have children (infertility).  Weak bones (osteoporosis) and broken bones (fractures). If you have coughing, wheezing, and shortness of breath, those symptoms may get better when you quit. You may also get sick less often. If you are pregnant, quitting smoking can help to lower your chances of having a baby of low birth weight. What can I do to help me quit smoking? Talk with your doctor about what can help you quit smoking. Some things you can do (strategies) include:  Quitting smoking totally, instead of slowly cutting back how much you smoke over a period of time.  Going to in-person counseling. You are more likely to quit if you go to many counseling sessions.  Using resources and support systems, such as:  Online chats with a counselor.  Phone quitlines.  Printed self-help materials.  Support groups or group counseling.  Text messaging programs.  Mobile phone apps or applications.  Taking medicines. Some of these medicines may have nicotine in them. If you are pregnant or breastfeeding, do not take any medicines to quit smoking unless your doctor says it is okay. Talk with your doctor about counseling or other things that can help you. Talk with your doctor about using more than one strategy at the same time, such as taking medicines while you are also going to in-person counseling. This can help make quitting  easier. What things can I do to make it easier to quit? Quitting smoking might feel very hard at first, but there is a lot that you can do to make it easier. Take these steps:  Talk to your family and friends. Ask them to support and encourage you.  Call phone quitlines, reach out to support groups, or work with a counselor.  Ask people who smoke to not smoke around you.  Avoid places that make you want (trigger) to smoke, such as:  Bars.  Parties.  Smoke-break areas at work.  Spend time with people who do not smoke.  Lower the stress in your life. Stress can make you want to smoke. Try these things to help your stress:  Getting regular exercise.  Deep-breathing exercises.  Yoga.  Meditating.  Doing a body scan. To do this, close your eyes, focus on one area of your body at a time from head to toe, and notice which parts of your body are tense. Try to relax the muscles in those areas.  Download or buy apps on your mobile phone or tablet that can help you stick to your quit plan. There are many free apps, such as QuitGuide from the CDC (Centers for Disease Control and Prevention). You can find more support from smokefree.gov and other websites. This information is not intended to replace advice given to you by your health care provider. Make sure you discuss any questions you have with your health care provider. Document Released: 05/30/2009 Document Revised: 03/31/2016 Document   Reviewed: 12/18/2014 Elsevier Interactive Patient Education  2017 [employer] Inc.  

## 2016-09-04 NOTE — Telephone Encounter (Signed)
[facility]ardo AllEll[facility]. Patient scheduled to see Dr. Everardo AllEllison on 1/22 @ 9:15am.

## 2016-09-04 NOTE — Progress Notes (Signed)
Pt reminded to pick up Amlodipine and start it in am and to f/u with Dr Everardo AllEllison.

## 2016-09-04 NOTE — Progress Notes (Signed)
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  CURRENT THERAPY: Observation.  INTERVAL HISTORY: Kellie Shaffer 26 y.o. female returns to the clinic today for follow-up visit. The patient was last seen in November 2015. She has a history of idiopathic thrombocytopenic purpura and has been on observation. She was seen recently at [facility] complaining of aching pain and during her blood work she was found to have low platelets count of 26,000. She was started on prednisone Dosepak. She is feeling much better. She is here today for evaluation and repeat blood work and recommendation regarding her condition. She has a history of hyperthyroidism and she was seen by Dr. Everardo All with [facility] in the past. She was unable to afford her medication because she did not have visual by that time and she missed his follow-up appointment. She continues to have the significant exophthalmus. She denied having any fever or chills. She has no nausea or vomiting. She denied having any chest pain, shortness breath, cough or hemoptysis. She denied having any bleeding issues.  MEDICAL HISTORY: Past Medical History:  Diagnosis Date  . Abortion May 2013  . Anemia   . Atrial fibrillation (HCC)   . Hypercholesteremia   . Hypertension   . Hyperthyroidism   . Ovarian cyst     ALLERGIES:  is allergic to benadryl [diphenhydramine hcl] and chloroxine.  MEDICATIONS:  Current Outpatient Prescriptions  Medication Sig Dispense Refill  . Biotin 47829 MCG TABS Take 1 tablet by mouth daily.     . cetirizine (ZYRTEC) 10 MG tablet Take 10 mg by mouth daily as needed for allergies.     Marland Kitchen ibuprofen (ADVIL,MOTRIN) 200 MG tablet Take 600-800 mg by mouth every  4 (four) hours as needed for fever, headache, mild pain, moderate pain or cramping.    . ondansetron (ZOFRAN ODT) 4 MG disintegrating tablet Take 1 tablet (4 mg total) by mouth every 8 (eight) hours as needed for nausea or vomiting. 10 tablet 0  . Phenylephrine-DM-GG-APAP (TYLENOL COLD/FLU SEVERE PO) Take 30 mLs by mouth daily as needed (cold).    . predniSONE (STERAPRED UNI-PAK 21 TAB) 10 MG (21) TBPK tablet Take 1 tablet (10 mg total) by mouth daily. Take 6 tabs by mouth daily  for 2 days, then 5 tabs for 2 days, then 4 tabs for 2 days, then 3 tabs for 2 days, 2 tabs for 2 days, then 1 tab by mouth daily for 2 days 42 tablet 0   No current facility-administered medications for this visit.     SURGICAL HISTORY: History reviewed. No pertinent surgical history.  REVIEW OF SYSTEMS:  Constitutional: positive for fatigue Eyes: positive for Exophthalmus Ears, nose, mouth, throat, and face: negative Respiratory: negative Cardiovascular: negative Gastrointestinal: negative Genitourinary:negative Integument/breast: negative Hematologic/lymphatic: negative Musculoskeletal:negative Neurological: negative Behavioral/Psych: negative Endocrine: negative Allergic/Immunologic: negative   PHYSICAL EXAMINATION: General appearance: alert, cooperative, appears stated age and fatigued Head: Normocephalic, without obvious abnormality, atraumatic, Exophthalmus of the eyes Neck: no adenopathy, no JVD, supple, symmetrical, trachea midline and thyroid not enlarged, symmetric, no tenderness/mass/nodules Lymph nodes: Cervical, supraclavicular, and axillary nodes normal. Resp: clear to auscultation bilaterally Back: symmetric, no curvature. ROM normal. No CVA tenderness. Cardio: regular rate and rhythm, S1, S2 normal, no murmur,  click, rub or gallop GI: soft, non-tender; bowel sounds normal; no masses,  no organomegaly Extremities: extremities normal, atraumatic, no cyanosis or edema Neurologic: Alert and  oriented X 3, normal strength and tone. Normal symmetric reflexes. Normal coordination and gait  ECOG PERFORMANCE STATUS: 1 - Symptomatic but completely ambulatory  Blood pressure (!) 188/99, pulse 88, temperature 98.4 F (36.9 C), temperature source Oral, resp. rate 18, height 5\' 9"  (1.753 m), weight 225 lb 1.6 oz (102.1 kg), last menstrual period 08/04/2016, SpO2 97 %.  LABORATORY DATA: Lab Results  Component Value Date   WBC 16.9 (H) 09/04/2016   HGB 10.6 (L) 09/04/2016   HCT 33.6 (L) 09/04/2016   MCV 76.9 (L) 09/04/2016   PLT 203 09/04/2016      Chemistry      Component Value Date/Time   NA 138 08/26/2016 2346   NA 138 07/05/2014 1135   K 3.3 (L) 08/27/2016 0532   K 3.7 07/05/2014 1135   CL 108 08/26/2016 2346   CO2 24 08/26/2016 2346   CO2 23 07/05/2014 1135   BUN 10 08/26/2016 2346   BUN 12.8 07/05/2014 1135   CREATININE 0.53 08/26/2016 2346   CREATININE 0.7 07/05/2014 1135      Component Value Date/Time   CALCIUM 8.1 (L) 08/26/2016 2346   CALCIUM 9.2 07/05/2014 1135   ALKPHOS 40 08/26/2016 2346   ALKPHOS 76 07/05/2014 1135   AST 15 08/26/2016 2346   AST 13 07/05/2014 1135   ALT 15 08/26/2016 2346   ALT 19 07/05/2014 1135   BILITOT 0.5 08/26/2016 2346   BILITOT 0.40 07/05/2014 1135       RADIOGRAPHIC STUDIES: No results found.  ASSESSMENT AND PLAN: This is a very pleasant 26 years old African-American female with: 1) idiopathic thrombocytopenic purpura: She is currently on observation. Her platelets counts were down to 26,000 when she was seen in the hospital last week. She was started on tapered dose of prednisone in her platelets count has increased to 203,000. I recommended for the patient to complete the taper dose of prednisone. She will need routine follow-up visit by her primary care physician for close monitoring of her blood count. 2) hyperthyroidism: The patient is not currently on any treatment. She was last seen her endocrinologist more than 2  years ago. She has significant exophthalmus. I referred her back to Dr. Everardo AllEllison for evaluation. 3) hypertension: Uncontrolled. The patient has no primary care physician at this point but is expecting to see a new physician next month. I will give her dose of clonidine 0.2 mg 1 today. I will also start the patient on Norvasc 5 mg by mouth daily and I gave her 1 month supply until she sees her primary care physician. 4) for the iron deficiency anemia, I will start the patient on Integra +1 capsule by mouth daily. She was advised to call immediately if she has any concerning symptoms in the interval. The patient voices understanding of current disease status and treatment options and is in agreement with the current care plan.  All questions were answered. The patient knows to call the clinic with any problems, questions or concerns. We can certainly see the patient much sooner if necessary.  Disclaimer: This note was dictated with voice recognition software. Similar sounding words can inadvertently be transcribed and may not be corrected upon review.

## 2016-09-04 NOTE — Telephone Encounter (Signed)
Pt given clonidine today for elevated BP and within 20 minutes bp down .

## 2016-09-05 NOTE — Progress Notes (Deleted)
   Subjective:    Patient ID: Kellie Shaffer, female    DOB: 22-Sep-1990, 26 y.o.   MRN: [id number]  HPI Pt is referred by Dr Arbutus PedMohamed, for hyperthyroidism.  Pt reports she was dx'ed with hyperthyroidism in 2013.  she has never been on therapy for this.  she has never had XRT to the anterior neck, or thyroid surgery.  she has never had thyroid imaging.  she does not consume kelp or any other prescribed or non-prescribed thyroid medication.  she has never been on amiodarone.      Review of Systems denies weight loss, headache, hoarseness, visual loss, palpitations, sob, diarrhea, polyuria, muscle weakness, excessive diaphoresis, tremor, anxiety, heat intolerance, easy bruising, and rhinorrhea.     Objective:   Physical Exam VS: see vs page GEN: no distress HEAD: head: no deformity eyes: no periorbital swelling, no proptosis external nose and ears are normal mouth: no lesion seen NECK: supple, thyroid is not enlarged CHEST WALL: no deformity LUNGS: clear to auscultation CV: reg rate and rhythm, no murmur ABD: abdomen is soft, nontender.  no hepatosplenomegaly.  not distended.  no hernia MUSCULOSKELETAL: muscle bulk and strength are grossly normal.  no obvious joint swelling.  gait is normal and steady EXTEMITIES: no deformity.  no edema PULSES: no carotid bruit NEURO:  cn 2-12 grossly intact.   readily moves all 4's.  sensation is intact to touch on all 4's.  Slight tremor of the hands. SKIN:  Normal texture and temperature.  No rash or suspicious lesion is visible.   NODES:  None palpable at the neck.  PSYCH: alert, well-oriented.  Does not appear anxious nor depressed.     Lab Results  Component Value Date   TSH 0.009 (L) 07/05/2014   US: Diffusely heterogeneous thyroid tissue with ill-defined nodularity most consistent with multinodular goiter.    I have reviewed outside records, and summarized: Pt was noted to have elevated a1c, and referred here.      Assessment &  Plan:

## 2016-09-07 ENCOUNTER — Ambulatory Visit: Payer: Self-pay | Admitting: Endocrinology

## 2016-09-21 ENCOUNTER — Ambulatory Visit (INDEPENDENT_AMBULATORY_CARE_PROVIDER_SITE_OTHER): Payer: Self-pay | Admitting: Endocrinology

## 2016-09-21 ENCOUNTER — Encounter: Payer: Self-pay | Admitting: Endocrinology

## 2016-09-21 VITALS — BP 132/84 | HR 94 | Ht 69.0 in | Wt 221.0 lb

## 2016-09-21 DIAGNOSIS — E0590 Thyrotoxicosis, unspecified without thyrotoxic crisis or storm: Secondary | ICD-10-CM

## 2016-09-21 LAB — TSH: TSH: 0.4 u[IU]/mL (ref 0.35–4.50)

## 2016-09-21 LAB — T4, FREE: Free T4: 1.44 ng/dL (ref 0.60–1.60)

## 2016-09-21 NOTE — Progress Notes (Signed)
   Subjective:    Patient ID: Kellie Shaffer, female    DOB: 18-Feb-1991, 26 y.o.   MRN: [id number]  HPI Pt returns for f/u of hyperthyroidism (dx'ed 2013, during a pregnancy; US showed a small multinodular goiter, but she also has proptosis; she had an elective termination; and was then rx'ed with tapazole; she was lost to f/u, but hyperthyroidism was found again during a hospitalization in late 2015 for appendicitis; she was restarted on tapazole then; pt says she is not at risk for another pregnancy).  She has not recently taken the tapazole.  She is now off steroids.  She says she is not at risk for pregnancy.  She has reg menses. Past Medical History:  Diagnosis Date  . Abortion May 2013  . Anemia   . Atrial fibrillation (HCC)   . Hypercholesteremia   . Hypertension   . Hyperthyroidism   . Iron deficiency anemia 09/04/2016  . Ovarian cyst     No past surgical history on file.  Social History   Social History  . Marital status: Single    Spouse name: N/A  . Number of children: N/A  . Years of education: 5814   Occupational History  . [employer]    Social History Main Topics  . Smoking status: Current Every Day Smoker    Packs/day: 0.25    Types: Cigarettes  . Smokeless tobacco: Never Used  . Alcohol use 0.0 oz/week     Comment: occasionally  . Drug use: No  . Sexual activity: Yes    Birth control/ protection: Condom   Other Topics Concern  . Not on file   Social History Narrative   Regular exercise-yes   Caffeine Use-yes          Current Outpatient Prescriptions on File Prior to Visit  Medication Sig Dispense Refill  . amLODipine (NORVASC) 5 MG tablet Take 1 tablet (5 mg total) by mouth daily. 30 tablet 0  . Biotin 4098110000 MCG TABS Take 1 tablet by mouth daily.     . cetirizine (ZYRTEC) 10 MG tablet Take 10 mg by mouth daily as needed for allergies.      No current facility-administered medications on file prior to visit.     Allergies  Allergen  Reactions  . Benadryl [Diphenhydramine Hcl] Anaphylaxis and Hives  . Chloroxine Hives    Clorox    Family History  Problem Relation Age of Onset  . Diabetes Other     Parent  . Cancer Other     Breast Cancer-Parent  . Hypertension Other     Parent  . Hyperlipidemia Other     Parent  . Arthritis Other     Parent    BP 132/84   Pulse 94   Ht 5\' 9"  (1.753 m)   Wt 221 lb (100.2 kg)   SpO2 97%   BMI 32.64 kg/m   Review of Systems Denies fever.      Objective:   Physical Exam VITAL SIGNS:  See vs page GENERAL: no distress eyes: no periorbital swelling, moderate bilateral proptosis  NECK: There is no palpable thyroid enlargement.  No thyroid nodule is palpable.  No palpable lymphadenopathy at the anterior neck.    Lab Results  Component Value Date   TSH 0.40 09/21/2016      Assessment & Plan:  Hyperthyroidism, in remission.  No rx is needed now.  Please come back for a follow-up appointment in 3 months.

## 2016-09-21 NOTE — Patient Instructions (Addendum)
blood tests are requested for you today.  We'll let you know about the results. Based on the results, you will probably need to resume the methimazole. We can reconsider the radioactive iodine in the future if you want.   In view of your medical condition, you should avoid pregnancy until we have decided it is safe,   Please come back for a follow-up appointment in 1 month.

## 2016-10-07 ENCOUNTER — Telehealth: Payer: Self-pay

## 2016-10-07 MED ORDER — AMLODIPINE BESYLATE 5 MG PO TABS: 5.0000 mg | ORAL_TABLET | Freq: Every day | ORAL | 0 refills | Status: DC

## 2016-10-07 NOTE — Telephone Encounter (Signed)
Pt called stating Dr Arbutus PedMohamed gave her rx for norvasc and instructions to f/u with PCP. Her appt with PCP is March 13 and she has run out of norvasc. Refilled norvasc #20 to get her to PCP appt.

## 2016-10-19 ENCOUNTER — Ambulatory Visit (INDEPENDENT_AMBULATORY_CARE_PROVIDER_SITE_OTHER): Payer: Self-pay | Admitting: Endocrinology

## 2016-10-19 ENCOUNTER — Encounter: Payer: Self-pay | Admitting: Endocrinology

## 2016-10-19 VITALS — BP 142/87 | HR 87 | Ht 69.0 in | Wt 220.0 lb

## 2016-10-19 DIAGNOSIS — E0590 Thyrotoxicosis, unspecified without thyrotoxic crisis or storm: Secondary | ICD-10-CM

## 2016-10-19 LAB — T4, FREE: FREE T4: 1.44 ng/dL (ref 0.60–1.60)

## 2016-10-19 LAB — TSH: TSH: 0.24 u[IU]/mL — AB (ref 0.35–4.50)

## 2016-10-19 NOTE — Progress Notes (Signed)
Subjective:    Patient ID: Kellie Shaffer, female    DOB: [birth year], 26 y.o.   MRN: [id number]  HPI Pt returns for f/u of hyperthyroidism (dx'ed 2013, during a pregnancy; US showed a small multinodular goiter, but she also has proptosis; she had an elective termination; and was then rx'ed with tapazole; she was lost to f/u, but hyperthyroidism was found again during a hospitalization in late 2015 for appendicitis; she was restarted on tapazole then; pt says she is not at risk for another pregnancy; f/u TFT in 2018 were normal off rx).  She says she is not at risk for pregnancy.  She has reg menses. Past Medical History:  Diagnosis Date  . Abortion May 2013  . Anemia   . Atrial fibrillation (HCC)   . Hypercholesteremia   . Hypertension   . Hyperthyroidism   . Iron deficiency anemia 09/04/2016  . Ovarian cyst     No past surgical history on file.  Social History   Social History  . Marital status: Single    Spouse name: N/A  . Number of children: N/A  . Years of education: 7514   Occupational History  . [employer]    Social History Main Topics  . Smoking status: Current Every Day Smoker    Packs/day: 0.25    Types: Cigarettes  . Smokeless tobacco: Never Used  . Alcohol use 0.0 oz/week     Comment: occasionally  . Drug use: No  . Sexual activity: Yes    Birth control/ protection: Condom   Other Topics Concern  . Not on file   Social History Narrative   Regular exercise-yes   Caffeine Use-yes          Current Outpatient Prescriptions on File Prior to Visit  Medication Sig Dispense Refill  . amLODipine (NORVASC) 5 MG tablet Take 1 tablet (5 mg total) by mouth daily. Quantity sufficient to get to PCP appt. 20 tablet 0  . Biotin [id number] MCG TABS Take 1 tablet by mouth daily.     . cetirizine (ZYRTEC) 10 MG tablet Take 10 mg by mouth daily as needed for allergies.      No current facility-administered medications on file prior to visit.     Allergies    Allergen Reactions  . Benadryl [Diphenhydramine Hcl] Anaphylaxis and Hives  . Chloroxine Hives    Clorox    Family History  Problem Relation Age of Onset  . Diabetes Other     Parent  . Cancer Other     Breast Cancer-Parent  . Hypertension Other     Parent  . Hyperlipidemia Other     Parent  . Arthritis Other     Parent    BP (!) 142/87   Pulse 87   Ht 5\' 9"  (1.753 m)   Wt 220 lb (99.8 kg)   SpO2 98%   BMI 32.49 kg/m    Review of Systems Denies fever and tremor.      Objective:   Physical Exam VITAL SIGNS:  See vs page.  GENERAL: no distress.  eyes: no periorbital swelling, moderate bilateral proptosis.  NECK: There is no palpable thyroid enlargement.  No thyroid nodule is palpable.  No palpable lymphadenopathy at the anterior neck.     Lab Results  Component Value Date   TSH 0.24 (L) 10/19/2016      Assessment & Plan:  Hyperthyroidism, mild, recurrent: No treatment is needed now, but please come back for a follow-up appointment in  3 months.

## 2016-10-19 NOTE — Patient Instructions (Addendum)
blood tests are requested for you today.  We'll let you know about the results. With time, your thyroid blood tests will become abnormal again. In view of your medical condition, you should avoid pregnancy until we have decided it is safe.    Please come back for a follow-up appointment in 4-6 months.

## 2017-01-13 ENCOUNTER — Ambulatory Visit (HOSPITAL_COMMUNITY)
Admission: EM | Admit: 2017-01-13 | Discharge: 2017-01-15 | Disposition: A | Discharge status: Home / Self Care | Payer: Self-pay | Attending: General Surgery | Admitting: General Surgery

## 2017-01-13 ENCOUNTER — Encounter (HOSPITAL_COMMUNITY): Payer: Self-pay

## 2017-01-13 DIAGNOSIS — I10 Essential (primary) hypertension: Secondary | ICD-10-CM | POA: Insufficient documentation

## 2017-01-13 DIAGNOSIS — I4891 Unspecified atrial fibrillation: Secondary | ICD-10-CM | POA: Insufficient documentation

## 2017-01-13 DIAGNOSIS — E0590 Thyrotoxicosis, unspecified without thyrotoxic crisis or storm: Secondary | ICD-10-CM | POA: Insufficient documentation

## 2017-01-13 DIAGNOSIS — Z79899 Other long term (current) drug therapy: Secondary | ICD-10-CM | POA: Insufficient documentation

## 2017-01-13 DIAGNOSIS — D509 Iron deficiency anemia, unspecified: Secondary | ICD-10-CM | POA: Insufficient documentation

## 2017-01-13 DIAGNOSIS — K3580 Unspecified acute appendicitis: Secondary | ICD-10-CM | POA: Diagnosis present

## 2017-01-13 DIAGNOSIS — E7800 Pure hypercholesterolemia, unspecified: Secondary | ICD-10-CM | POA: Insufficient documentation

## 2017-01-13 DIAGNOSIS — K353 Acute appendicitis with localized peritonitis, without perforation or gangrene: Secondary | ICD-10-CM

## 2017-01-13 DIAGNOSIS — F17210 Nicotine dependence, cigarettes, uncomplicated: Secondary | ICD-10-CM | POA: Insufficient documentation

## 2017-01-13 NOTE — ED Provider Notes (Signed)
[facility]-EMERGENCY DEPT Provider Note   CSN: [id number] Arrival date & time: 01/13/17  1841     History   Chief Complaint Chief Complaint  Patient presents with  . Abdominal Pain    HPI Kellie Shaffer is a 26 y.o. female.  Patient presents with right lower quadrant abdominal pain that started earlier today and has gotten progressively worse throughout the day. She has had nausea without vomiting. No diarrhea. She reports decreased appetite. No fever. She denies vaginal discharge, dysuria. She states she has had appendicitis in the past but was found to have low platelets at the time and surgery was not performed.    The history is provided by the patient. No language interpreter was used.    Past Medical History:  Diagnosis Date  . Abortion May 2013  . Anemia   . Atrial fibrillation (HCC)   . Hypercholesteremia   . Hypertension   . Hyperthyroidism   . Iron deficiency anemia 09/04/2016  . Ovarian cyst     Patient Active Problem List   Diagnosis Date Noted  . Iron deficiency anemia 09/04/2016  . Atrial flutter (HCC) 07/05/2014  . Hyperthyroidism 07/05/2014  . Thrombocytopenia (HCC) 05/27/2014  . Acute appendicitis, uncomplicated 05/26/2014  . Hypercholesteremia     History reviewed. No pertinent surgical history.  OB History    Gravida Para Term Preterm AB Living   1             SAB TAB Ectopic Multiple Live Births                   Home Medications    Prior to Admission medications   Medication Sig Start Date End Date Taking? Authorizing Provider  amLODipine (NORVASC) 5 MG tablet Take 1 tablet (5 mg total) by mouth daily. Quantity sufficient to get to PCP appt. 10/07/16   Si Gaul, MD  Biotin 40981 MCG TABS Take 1 tablet by mouth daily.     [provider]  cetirizine (ZYRTEC) 10 MG tablet Take 10 mg by mouth daily as needed for allergies.     [provider]  IRON PO Take by mouth.    [provider]  Multiple Vitamin  (MULTIVITAMIN) capsule Take 1 capsule by mouth daily.    [provider]    Family History Family History  Problem Relation Age of Onset  . Diabetes Other        Parent  . Cancer Other        Breast Cancer-Parent  . Hypertension Other        Parent  . Hyperlipidemia Other        Parent  . Arthritis Other        Parent    Social History Social History  Substance Use Topics  . Smoking status: Current Every Day Smoker    Packs/day: 0.25    Types: Cigarettes  . Smokeless tobacco: Never Used  . Alcohol use 0.0 oz/week     Comment: occasionally     Allergies   Benadryl [diphenhydramine hcl] and Chloroxine   Review of Systems Review of Systems  Constitutional: Positive for appetite change and chills. Negative for fever.  Respiratory: Negative.  Negative for shortness of breath.   Cardiovascular: Negative.  Negative for chest pain.  Gastrointestinal: Positive for abdominal pain and nausea. Negative for diarrhea and vomiting.  Genitourinary: Negative.  Negative for dysuria, vaginal bleeding and vaginal discharge.  Musculoskeletal: Negative.  Negative for back pain and myalgias.  Skin: Negative.   Neurological: Negative.  Negative for weakness.     Physical Exam Updated Vital Signs BP (!) 128/113 (BP Location: Left Arm)   Pulse 86   Temp 97.3 F (36.3 C) (Oral)   Resp 20   SpO2 100%   Physical Exam  Constitutional: She is oriented to person, place, and time. She appears well-developed and well-nourished.  HENT:  Head: Normocephalic.  Mouth/Throat: Mucous membranes are dry.  Neck: Normal range of motion. Neck supple.  Cardiovascular: Normal rate and regular rhythm.   Pulmonary/Chest: Effort normal and breath sounds normal. She has no wheezes. She has no rales.  Abdominal: Soft. Bowel sounds are normal. There is tenderness. There is no rebound and no guarding.  Significant lower abdominal tenderness, greatest in the RLQ and suprapubic areas. There is  guarding. No rigidity.  Musculoskeletal: Normal range of motion.  Neurological: She is alert and oriented to person, place, and time.  Skin: Skin is warm and dry.  Psychiatric: She has a normal mood and affect.     ED Treatments / Results  Labs (all labs ordered are listed, but only abnormal results are displayed) Labs Reviewed  LIPASE, BLOOD  COMPREHENSIVE METABOLIC PANEL  CBC  URINALYSIS, ROUTINE W REFLEX MICROSCOPIC  POC URINE PREG, ED   Results for orders placed or performed during the hospital encounter of 01/13/17  Lipase, blood  Result Value Ref Range   Lipase 20 11 - 51 U/L  Comprehensive metabolic panel  Result Value Ref Range   Sodium 137 135 - 145 mmol/L   Potassium 3.2 (L) 3.5 - 5.1 mmol/L   Chloride 102 101 - 111 mmol/L   CO2 24 22 - 32 mmol/L   Glucose, Bld 110 (H) 65 - 99 mg/dL   BUN 13 6 - 20 mg/dL   Creatinine, Ser 1.61 0.44 - 1.00 mg/dL   Calcium 9.4 8.9 - 09.6 mg/dL   Total Protein 9.1 (H) 6.5 - 8.1 g/dL   Albumin 4.3 3.5 - 5.0 g/dL   AST 13 (L) 15 - 41 U/L   ALT 20 14 - 54 U/L   Alkaline Phosphatase 61 38 - 126 U/L   Total Bilirubin 0.4 0.3 - 1.2 mg/dL   GFR calc non Af Amer >60 >60 mL/min   GFR calc Af Amer >60 >60 mL/min   Anion gap 11 5 - 15  CBC  Result Value Ref Range   WBC 21.5 (H) 4.0 - 10.5 K/uL   RBC 4.72 3.87 - 5.11 MIL/uL   Hemoglobin 12.4 12.0 - 15.0 g/dL   HCT 04.5 40.9 - 81.1 %   MCV 78.4 78.0 - 100.0 fL   MCH 26.3 26.0 - 34.0 pg   MCHC 33.5 30.0 - 36.0 g/dL   RDW 91.4 78.2 - 95.6 %   Platelets 162 150 - 400 K/uL  POC urine preg, ED  Result Value Ref Range   Preg Test, Ur NEGATIVE NEGATIVE     EKG  EKG Interpretation None       Radiology No results found.  Procedures Procedures (including critical care time)  Medications Ordered in ED Medications - No data to display   Initial Impression / Assessment and Plan / ED Course  I have reviewed the triage vital signs and the nursing notes.  Pertinent labs &  imaging results that were available during my care of the patient were reviewed by me and considered in my medical decision making (see chart for details).     Patient  with RLQ pain x 1 day. No fever, vomiting or diarrhea. She endorses anorexia and nausea.   Significant leukocytosis of 21.5. Pelvic exam deferred until CT abd/pel performed as Appy is likely diagnosis. Pain medications provided. Patient and family updated.  CT shows uncomplicated appendicitis. Surgery paged and patient is admitted to their service.    Final Clinical Impressions(s) / ED Diagnoses   Final diagnoses:  None   1. Acute appendicitis  New Prescriptions New Prescriptions   No medications on file     Danne HarborUpstill, Emmalia Heyboer, PA-C 01/31/17 [id number]    Raeford RazorKohut, Stephen, MD 02/01/17 0030

## 2017-01-13 NOTE — ED Triage Notes (Signed)
States right lower quad pain for a couple of hours with nausea [facility]

## 2017-01-13 NOTE — ED Notes (Signed)
I attempted to collect labs and was unsuccessful. 

## 2017-01-14 ENCOUNTER — Emergency Department (HOSPITAL_COMMUNITY): Payer: Self-pay

## 2017-01-14 ENCOUNTER — Observation Stay (HOSPITAL_COMMUNITY): Payer: Self-pay | Admitting: Anesthesiology

## 2017-01-14 ENCOUNTER — Encounter (HOSPITAL_COMMUNITY): Admission: EM | Disposition: A | Discharge status: Home / Self Care | Payer: Self-pay | Source: Home / Self Care

## 2017-01-14 ENCOUNTER — Encounter (HOSPITAL_COMMUNITY): Payer: Self-pay

## 2017-01-14 DIAGNOSIS — K3580 Unspecified acute appendicitis: Secondary | ICD-10-CM | POA: Diagnosis present

## 2017-01-14 HISTORY — PX: LAPAROSCOPIC APPENDECTOMY: SHX408

## 2017-01-14 LAB — LIPASE, BLOOD: Lipase: 20 U/L (ref 11–51)

## 2017-01-14 LAB — HIV ANTIBODY (ROUTINE TESTING W REFLEX): HIV Screen 4th Generation wRfx: NONREACTIVE

## 2017-01-14 LAB — COMPREHENSIVE METABOLIC PANEL
ALK PHOS: 61 U/L (ref 38–126)
ALT: 20 U/L (ref 14–54)
AST: 13 U/L — ABNORMAL LOW (ref 15–41)
Albumin: 4.3 g/dL (ref 3.5–5.0)
Anion gap: 11 (ref 5–15)
BILIRUBIN TOTAL: 0.4 mg/dL (ref 0.3–1.2)
BUN: 13 mg/dL (ref 6–20)
CALCIUM: 9.4 mg/dL (ref 8.9–10.3)
CO2: 24 mmol/L (ref 22–32)
Chloride: 102 mmol/L (ref 101–111)
Creatinine, Ser: 0.6 mg/dL (ref 0.44–1.00)
GFR calc non Af Amer: >60 mL/min (ref >60)
GLUCOSE: 110 mg/dL — AB (ref 65–99)
Potassium: 3.2 mmol/L — ABNORMAL LOW (ref 3.5–5.1)
SODIUM: 137 mmol/L (ref 135–145)
TOTAL PROTEIN: 9.1 g/dL — AB (ref 6.5–8.1)

## 2017-01-14 LAB — CBC
HEMATOCRIT: 37 % (ref 36.0–46.0)
Hemoglobin: 12.4 g/dL (ref 12.0–15.0)
MCH: 26.3 pg (ref 26.0–34.0)
MCHC: 33.5 g/dL (ref 30.0–36.0)
MCV: 78.4 fL (ref 78.0–100.0)
Platelets: 162 10*3/uL (ref 150–400)
RBC: 4.72 MIL/uL (ref 3.87–5.11)
RDW: 14.8 % (ref 11.5–15.5)
WBC: 21.5 10*3/uL — ABNORMAL HIGH (ref 4.0–10.5)

## 2017-01-14 LAB — URINALYSIS, ROUTINE W REFLEX MICROSCOPIC
BACTERIA UA: NONE SEEN
BILIRUBIN URINE: NEGATIVE
GLUCOSE, UA: NEGATIVE mg/dL
HGB URINE DIPSTICK: NEGATIVE
KETONES UR: NEGATIVE mg/dL
NITRITE: NEGATIVE
PROTEIN: 30 mg/dL — AB
Specific Gravity, Urine: 1.03 (ref 1.005–1.030)
pH: 5 (ref 5.0–8.0)

## 2017-01-14 LAB — POC URINE PREG, ED: PREG TEST UR: NEGATIVE

## 2017-01-14 LAB — WET PREP, GENITAL
Sperm: NONE SEEN
Trich, Wet Prep: NONE SEEN
Yeast Wet Prep HPF POC: NONE SEEN

## 2017-01-14 SURGERY — APPENDECTOMY, LAPAROSCOPIC
Anesthesia: General | Site: Abdomen

## 2017-01-14 MED ORDER — ONDANSETRON HCL 4 MG/2ML IJ SOLN
4.0000 mg | Freq: Four times a day (QID) | INTRAMUSCULAR | Status: DC | PRN
  Administered 2017-01-14: 4 mg via INTRAVENOUS

## 2017-01-14 MED ORDER — HYDROMORPHONE HCL 1 MG/ML IJ SOLN
0.2500 mg | INTRAMUSCULAR | Status: DC | PRN
  Administered 2017-01-14 (×3): 0.5 mg via INTRAVENOUS

## 2017-01-14 MED ORDER — ENOXAPARIN SODIUM 40 MG/0.4ML ~~LOC~~ SOLN: 40.0000 mg | SUBCUTANEOUS | Status: DC

## 2017-01-14 MED ORDER — LACTATED RINGERS IV SOLN
INTRAVENOUS | Status: DC | PRN
  Administered 2017-01-14 (×2): via INTRAVENOUS

## 2017-01-14 MED ORDER — METRONIDAZOLE IN NACL 5-0.79 MG/ML-% IV SOLN
500.0000 mg | Freq: Three times a day (TID) | INTRAVENOUS | Status: DC
  Administered 2017-01-14 – 2017-01-15 (×2): 500 mg via INTRAVENOUS
  Filled 2017-01-14 (×2): qty 100

## 2017-01-14 MED ORDER — HYDROMORPHONE HCL 1 MG/ML IJ SOLN
INTRAMUSCULAR | Status: AC
  Administered 2017-01-14: 0.5 mg via INTRAVENOUS
  Filled 2017-01-14: qty 1

## 2017-01-14 MED ORDER — FENTANYL CITRATE (PF) 250 MCG/5ML IJ SOLN
INTRAMUSCULAR | Status: AC
  Filled 2017-01-14: qty 5

## 2017-01-14 MED ORDER — ONDANSETRON HCL 4 MG/2ML IJ SOLN
4.0000 mg | Freq: Four times a day (QID) | INTRAMUSCULAR | Status: DC | PRN
  Filled 2017-01-14: qty 2

## 2017-01-14 MED ORDER — PROPOFOL 10 MG/ML IV BOLUS
INTRAVENOUS | Status: AC
  Filled 2017-01-14: qty 20

## 2017-01-14 MED ORDER — HYDROCODONE-ACETAMINOPHEN 5-325 MG PO TABS
1.0000 | ORAL_TABLET | ORAL | Status: DC | PRN
  Administered 2017-01-14: 1 via ORAL
  Administered 2017-01-15: 2 via ORAL
  Administered 2017-01-15: 1 via ORAL
  Filled 2017-01-14: qty 1
  Filled 2017-01-14: qty 2
  Filled 2017-01-14: qty 1

## 2017-01-14 MED ORDER — HYDROMORPHONE HCL 1 MG/ML IJ SOLN
1.0000 mg | INTRAMUSCULAR | Status: DC
  Administered 2017-01-14: 1 mg via INTRAVENOUS
  Filled 2017-01-14: qty 1

## 2017-01-14 MED ORDER — FENTANYL CITRATE (PF) 100 MCG/2ML IJ SOLN
INTRAMUSCULAR | Status: AC
  Filled 2017-01-14: qty 2

## 2017-01-14 MED ORDER — IOPAMIDOL (ISOVUE-300) INJECTION 61%
100.0000 mL | Freq: Once | INTRAVENOUS | Status: AC | PRN
  Administered 2017-01-14: 100 mL via INTRAVENOUS

## 2017-01-14 MED ORDER — METRONIDAZOLE IN NACL 5-0.79 MG/ML-% IV SOLN
500.0000 mg | Freq: Three times a day (TID) | INTRAVENOUS | Status: DC
  Administered 2017-01-14: 500 mg via INTRAVENOUS

## 2017-01-14 MED ORDER — HYDROMORPHONE HCL 1 MG/ML IJ SOLN
INTRAMUSCULAR | Status: AC
  Filled 2017-01-14: qty 0.5

## 2017-01-14 MED ORDER — IOPAMIDOL (ISOVUE-300) INJECTION 61%
INTRAVENOUS | Status: AC
  Administered 2017-01-14: 100 mL via INTRAVENOUS
  Filled 2017-01-14: qty 100

## 2017-01-14 MED ORDER — DEXTROSE 5 % IV SOLN
2.0000 g | Freq: Once | INTRAVENOUS | Status: AC
  Administered 2017-01-14: 2 g via INTRAVENOUS
  Filled 2017-01-14: qty 2

## 2017-01-14 MED ORDER — PROPOFOL 10 MG/ML IV BOLUS
INTRAVENOUS | Status: DC | PRN
  Administered 2017-01-14: 170 mg via INTRAVENOUS

## 2017-01-14 MED ORDER — ONDANSETRON HCL 4 MG/2ML IJ SOLN
INTRAMUSCULAR | Status: AC
  Filled 2017-01-14: qty 2

## 2017-01-14 MED ORDER — ONDANSETRON HCL 4 MG/2ML IJ SOLN
INTRAMUSCULAR | Status: DC | PRN
  Administered 2017-01-14: 4 mg via INTRAVENOUS

## 2017-01-14 MED ORDER — MIDAZOLAM HCL 2 MG/2ML IJ SOLN
INTRAMUSCULAR | Status: AC
  Filled 2017-01-14: qty 2

## 2017-01-14 MED ORDER — LORATADINE 10 MG PO TABS: 10.0000 mg | ORAL_TABLET | Freq: Every day | ORAL | Status: DC

## 2017-01-14 MED ORDER — NON FORMULARY: 10.0000 | Freq: Every day | Status: DC

## 2017-01-14 MED ORDER — ONDANSETRON HCL 4 MG/2ML IJ SOLN
4.0000 mg | Freq: Once | INTRAMUSCULAR | Status: AC
  Administered 2017-01-14: 4 mg via INTRAVENOUS
  Filled 2017-01-14: qty 2

## 2017-01-14 MED ORDER — BUPIVACAINE-EPINEPHRINE 0.25% -1:200000 IJ SOLN
INTRAMUSCULAR | Status: DC | PRN
  Administered 2017-01-14: 12 mL

## 2017-01-14 MED ORDER — ACETAMINOPHEN 10 MG/ML IV SOLN
1000.0000 mg | Freq: Once | INTRAVENOUS | Status: AC
  Administered 2017-01-14: 1000 mg via INTRAVENOUS

## 2017-01-14 MED ORDER — LACTATED RINGERS IR SOLN
Status: DC | PRN
  Administered 2017-01-14: 1000 mL

## 2017-01-14 MED ORDER — HYDROMORPHONE HCL 1 MG/ML IJ SOLN
1.0000 mg | Freq: Once | INTRAMUSCULAR | Status: AC
  Administered 2017-01-14: 1 mg via INTRAVENOUS
  Filled 2017-01-14: qty 1

## 2017-01-14 MED ORDER — KETOROLAC TROMETHAMINE 30 MG/ML IJ SOLN: 30.0000 mg | Freq: Once | INTRAMUSCULAR | Status: DC | PRN

## 2017-01-14 MED ORDER — DEXTROSE 5 % IV SOLN
2.0000 g | INTRAVENOUS | Status: DC
  Administered 2017-01-15: 2 g via INTRAVENOUS
  Filled 2017-01-14: qty 2

## 2017-01-14 MED ORDER — CHLORHEXIDINE GLUCONATE CLOTH 2 % EX PADS: 6.0000 | MEDICATED_PAD | Freq: Once | CUTANEOUS | Status: DC

## 2017-01-14 MED ORDER — METRONIDAZOLE IN NACL 5-0.79 MG/ML-% IV SOLN
500.0000 mg | Freq: Once | INTRAVENOUS | Status: AC
  Administered 2017-01-14: 500 mg via INTRAVENOUS
  Filled 2017-01-14: qty 100

## 2017-01-14 MED ORDER — ONDANSETRON 4 MG PO TBDP: 4.0000 mg | ORAL_TABLET | Freq: Four times a day (QID) | ORAL | Status: DC | PRN

## 2017-01-14 MED ORDER — METOPROLOL TARTRATE 12.5 MG HALF TABLET
12.5000 mg | ORAL_TABLET | Freq: Two times a day (BID) | ORAL | Status: DC
  Administered 2017-01-14: 12.5 mg via ORAL
  Filled 2017-01-14: qty 1

## 2017-01-14 MED ORDER — DEXTROSE 5 % IV SOLN: 2.0000 g | INTRAVENOUS | Status: DC

## 2017-01-14 MED ORDER — SUGAMMADEX SODIUM 200 MG/2ML IV SOLN
INTRAVENOUS | Status: DC | PRN
  Administered 2017-01-14: 250 mg via INTRAVENOUS

## 2017-01-14 MED ORDER — SUGAMMADEX SODIUM 500 MG/5ML IV SOLN
INTRAVENOUS | Status: AC
  Filled 2017-01-14: qty 5

## 2017-01-14 MED ORDER — LACTATED RINGERS IV SOLN
INTRAVENOUS | Status: DC
  Administered 2017-01-14 – 2017-01-15 (×2): via INTRAVENOUS

## 2017-01-14 MED ORDER — ROCURONIUM BROMIDE 50 MG/5ML IV SOSY
PREFILLED_SYRINGE | INTRAVENOUS | Status: DC | PRN
  Administered 2017-01-14: 40 mg via INTRAVENOUS
  Administered 2017-01-14: 10 mg via INTRAVENOUS

## 2017-01-14 MED ORDER — DEXAMETHASONE SODIUM PHOSPHATE 10 MG/ML IJ SOLN
INTRAMUSCULAR | Status: DC | PRN
  Administered 2017-01-14: 10 mg via INTRAVENOUS

## 2017-01-14 MED ORDER — SUCCINYLCHOLINE CHLORIDE 200 MG/10ML IV SOSY
PREFILLED_SYRINGE | INTRAVENOUS | Status: DC | PRN
  Administered 2017-01-14: 100 mg via INTRAVENOUS

## 2017-01-14 MED ORDER — HYDROCORTISONE 1 % EX OINT
TOPICAL_OINTMENT | Freq: Two times a day (BID) | CUTANEOUS | Status: DC
  Filled 2017-01-14: qty 28.35

## 2017-01-14 MED ORDER — HYDROMORPHONE HCL 1 MG/ML IJ SOLN
1.0000 mg | INTRAMUSCULAR | Status: DC | PRN
  Administered 2017-01-14 (×2): 1 mg via INTRAVENOUS
  Filled 2017-01-14 (×2): qty 1

## 2017-01-14 MED ORDER — LACTATED RINGERS IV SOLN
INTRAVENOUS | Status: DC
  Administered 2017-01-14: 09:00:00 via INTRAVENOUS

## 2017-01-14 MED ORDER — METRONIDAZOLE IN NACL 5-0.79 MG/ML-% IV SOLN
INTRAVENOUS | Status: AC
  Filled 2017-01-14: qty 100

## 2017-01-14 MED ORDER — LIDOCAINE 2% (20 MG/ML) 5 ML SYRINGE
INTRAMUSCULAR | Status: DC | PRN
Start: 2017-01-14 — End: 2017-01-14
  Administered 2017-01-14: 100 mg via INTRAVENOUS

## 2017-01-14 MED ORDER — AMLODIPINE BESYLATE 5 MG PO TABS
5.0000 mg | ORAL_TABLET | Freq: Every day | ORAL | Status: DC
  Filled 2017-01-14: qty 1

## 2017-01-14 MED ORDER — PROMETHAZINE HCL 25 MG/ML IJ SOLN: 6.2500 mg | INTRAMUSCULAR | Status: DC | PRN

## 2017-01-14 MED ORDER — HYDROMORPHONE HCL 1 MG/ML IJ SOLN
0.5000 mg | Freq: Once | INTRAMUSCULAR | Status: AC
  Administered 2017-01-14: 0.5 mg via INTRAVENOUS
  Filled 2017-01-14: qty 0.5

## 2017-01-14 MED ORDER — DEXAMETHASONE SODIUM PHOSPHATE 10 MG/ML IJ SOLN
INTRAMUSCULAR | Status: AC
  Filled 2017-01-14: qty 1

## 2017-01-14 MED ORDER — HYDROMORPHONE HCL 1 MG/ML IJ SOLN: 1.0000 mg | INTRAMUSCULAR | Status: DC | PRN

## 2017-01-14 MED ORDER — CETIRIZINE HCL 10 MG PO TABS
10.0000 mg | ORAL_TABLET | Freq: Every day | ORAL | Status: DC
  Administered 2017-01-14: 10 mg via ORAL
  Filled 2017-01-14 (×2): qty 1

## 2017-01-14 MED ORDER — MIDAZOLAM HCL 5 MG/5ML IJ SOLN
INTRAMUSCULAR | Status: DC | PRN
  Administered 2017-01-14: 2 mg via INTRAVENOUS

## 2017-01-14 MED ORDER — METHOCARBAMOL 500 MG PO TABS
500.0000 mg | ORAL_TABLET | Freq: Four times a day (QID) | ORAL | Status: DC | PRN
  Administered 2017-01-14 – 2017-01-15 (×2): 500 mg via ORAL
  Filled 2017-01-14 (×2): qty 1

## 2017-01-14 MED ORDER — SUCCINYLCHOLINE CHLORIDE 200 MG/10ML IV SOSY
PREFILLED_SYRINGE | INTRAVENOUS | Status: AC
  Filled 2017-01-14: qty 10

## 2017-01-14 MED ORDER — ACETAMINOPHEN 10 MG/ML IV SOLN
INTRAVENOUS | Status: AC
  Administered 2017-01-14: 1000 mg via INTRAVENOUS
  Filled 2017-01-14: qty 100

## 2017-01-14 MED ORDER — 0.9 % SODIUM CHLORIDE (POUR BTL) OPTIME
TOPICAL | Status: DC | PRN
  Administered 2017-01-14: 1000 mL

## 2017-01-14 MED ORDER — ROCURONIUM BROMIDE 50 MG/5ML IV SOSY
PREFILLED_SYRINGE | INTRAVENOUS | Status: AC
  Filled 2017-01-14: qty 5

## 2017-01-14 MED ORDER — LIDOCAINE 2% (20 MG/ML) 5 ML SYRINGE
INTRAMUSCULAR | Status: AC
  Filled 2017-01-14: qty 5

## 2017-01-14 MED ORDER — FENTANYL CITRATE (PF) 100 MCG/2ML IJ SOLN
INTRAMUSCULAR | Status: DC | PRN
  Administered 2017-01-14 (×7): 50 ug via INTRAVENOUS

## 2017-01-14 MED ORDER — BUPIVACAINE-EPINEPHRINE (PF) 0.25% -1:200000 IJ SOLN
INTRAMUSCULAR | Status: AC
  Filled 2017-01-14: qty 30

## 2017-01-14 SURGICAL SUPPLY — 42 items
ADH SKN CLS APL DERMABOND .7 (GAUZE/BANDAGES/DRESSINGS) ×1
APPLIER CLIP ROT 10 11.4 M/L (STAPLE)
APR CLP MED LRG 11.4X10 (STAPLE)
BAG SPEC RTRVL LRG 6X4 10 (ENDOMECHANICALS) ×1
CABLE HIGH FREQUENCY MONO STRZ (ELECTRODE) ×1 IMPLANT
CHLORAPREP W/TINT 26ML (MISCELLANEOUS) ×2 IMPLANT
CLIP APPLIE ROT 10 11.4 M/L (STAPLE) IMPLANT
COVER SURGICAL LIGHT HANDLE (MISCELLANEOUS) ×2 IMPLANT
CUTTER FLEX LINEAR 45M (STAPLE) ×1 IMPLANT
DECANTER SPIKE VIAL GLASS SM (MISCELLANEOUS) ×1 IMPLANT
DERMABOND ADVANCED (GAUZE/BANDAGES/DRESSINGS) ×1
DERMABOND ADVANCED .7 DNX12 (GAUZE/BANDAGES/DRESSINGS) ×1 IMPLANT
DEVICE TROCAR PUNCTURE CLOSURE (ENDOMECHANICALS) IMPLANT
DRAPE LAPAROSCOPIC ABDOMINAL (DRAPES) ×2 IMPLANT
DRAPE LG THREE QUARTER DISP (DRAPES) ×1 IMPLANT
ELECT REM PT RETURN 15FT ADLT (MISCELLANEOUS) ×2 IMPLANT
ENDOLOOP SUT PDS II  0 18 (SUTURE)
ENDOLOOP SUT PDS II 0 18 (SUTURE) IMPLANT
GAUZE SPONGE 2X2 8PLY STRL LF (GAUZE/BANDAGES/DRESSINGS) IMPLANT
GLOVE EUDERMIC 7 POWDERFREE (GLOVE) ×2 IMPLANT
GOWN STRL REUS W/TWL XL LVL3 (GOWN DISPOSABLE) ×7 IMPLANT
IRRIG SUCT STRYKERFLOW 2 WTIP (MISCELLANEOUS)
IRRIGATION SUCT STRKRFLW 2 WTP (MISCELLANEOUS) ×1 IMPLANT
KIT BASIN OR (CUSTOM PROCEDURE TRAY) ×2 IMPLANT
POUCH SPECIMEN RETRIEVAL 10MM (ENDOMECHANICALS) ×2 IMPLANT
RELOAD 45 VASCULAR/THIN (ENDOMECHANICALS) IMPLANT
RELOAD STAPLE 45 2.5 WHT GRN (ENDOMECHANICALS) IMPLANT
RELOAD STAPLE 45 3.5 BLU ETS (ENDOMECHANICALS) IMPLANT
RELOAD STAPLE TA45 3.5 REG BLU (ENDOMECHANICALS) IMPLANT
SET IRRIG TUBING LAPAROSCOPIC (IRRIGATION / IRRIGATOR) ×1 IMPLANT
SHEARS HARMONIC ACE PLUS 36CM (ENDOMECHANICALS) ×2 IMPLANT
SPONGE GAUZE 2X2 STER 10/PKG (GAUZE/BANDAGES/DRESSINGS)
STRIP CLOSURE SKIN 1/2X4 (GAUZE/BANDAGES/DRESSINGS) IMPLANT
SUT MNCRL AB 4-0 PS2 18 (SUTURE) ×3 IMPLANT
SUT VICRYL 0 UR6 27IN ABS (SUTURE) IMPLANT
TOWEL OR 17X26 10 PK STRL BLUE (TOWEL DISPOSABLE) ×2 IMPLANT
TOWEL OR NON WOVEN STRL DISP B (DISPOSABLE) ×2 IMPLANT
TRAY FOLEY W/METER SILVER 16FR (SET/KITS/TRAYS/PACK) ×1 IMPLANT
TRAY LAPAROSCOPIC (CUSTOM PROCEDURE TRAY) ×2 IMPLANT
TROCAR BLADELESS OPT 5 100 (ENDOMECHANICALS) ×2 IMPLANT
TROCAR XCEL 12X100 BLDLESS (ENDOMECHANICALS) ×2 IMPLANT
TROCAR XCEL BLUNT TIP 100MML (ENDOMECHANICALS) ×2 IMPLANT

## 2017-01-14 NOTE — Anesthesia Postprocedure Evaluation (Signed)
Anesthesia Post Note  Patient: Kellie Shaffer  Procedure(s) Performed: Procedure(s) (LRB): APPENDECTOMY LAPAROSCOPIC (N/A)     Patient location during evaluation: PACU Anesthesia Type: General Level of consciousness: awake and alert Pain management: pain level controlled Vital Signs Assessment: post-procedure vital signs reviewed and stable Respiratory status: spontaneous breathing, nonlabored ventilation, respiratory function stable and patient connected to nasal cannula oxygen Cardiovascular status: blood pressure returned to baseline and stable Postop Assessment: no signs of nausea or vomiting Anesthetic complications: no    Last Vitals:  Vitals:   01/14/17 1700 01/14/17 1715  BP: (!) 117/59 (!) 110/54  Pulse: 65 63  Resp: 13 13  Temp:  37.1 C    Last Pain:  Vitals:   01/14/17 1700  TempSrc:   PainSc: 7                  Treasure Ingrum S

## 2017-01-14 NOTE — Anesthesia Preprocedure Evaluation (Signed)
Anesthesia Evaluation  Patient identified by MRN, date of birth, ID band Patient awake    Reviewed: Allergy & Precautions, NPO status , Patient's Chart, lab work & pertinent test results  Airway Mallampati: II  TM Distance: >3 FB Neck ROM: Full    Dental no notable dental hx.    Pulmonary Current Smoker,    Pulmonary exam normal breath sounds clear to auscultation       Cardiovascular hypertension, Normal cardiovascular exam Rhythm:Regular Rate:Normal     Neuro/Psych negative neurological ROS  negative psych ROS   GI/Hepatic negative GI ROS, Neg liver ROS,   Endo/Other  Hyperthyroidism   Renal/GU negative Renal ROS  negative genitourinary   Musculoskeletal negative musculoskeletal ROS (+)   Abdominal   Peds negative pediatric ROS (+)  Hematology negative hematology ROS (+)   Anesthesia Other Findings   Reproductive/Obstetrics negative OB ROS                             Anesthesia Physical Anesthesia Plan  ASA: II  Anesthesia Plan: General   Post-op Pain Management:    Induction: Intravenous  Airway Management Planned: Oral ETT  Additional Equipment:   Intra-op Plan:   Post-operative Plan: Extubation in OR  Informed Consent: I have reviewed the patients History and Physical, chart, labs and discussed the procedure including the risks, benefits and alternatives for the proposed anesthesia with the patient or authorized representative who has indicated his/her understanding and acceptance.   Dental advisory given  Plan Discussed with: CRNA and Surgeon  Anesthesia Plan Comments:         Anesthesia Quick Evaluation

## 2017-01-14 NOTE — Op Note (Signed)
Patient Name:           Kellie Shaffer   Date of Surgery:        01/14/2017  Pre op Diagnosis:      Acute appendicitis  Post op Diagnosis:    Same  Procedure:                 Laparoscopic appendectomy  Surgeon:                     Angelia Mould. Derrell Lolling, M.D., FACS  Assistant:                      OR staff   Indication for Assistant: n/a  Operative Indications:   This is a 26 year old female who presented to the emergency room late last night with right lower quadrant abdominal pain that had been going on for about 12 hours.  She was nauseated but without vomiting.  History appendicitis about 3 years ago uncomplicated but when admitted was found to have profound thrombocytopenia and hyperthyroidism.  She was treated with antibiotics in the appendicitis resolved.  She's had her hyperthyroidism treated by an endocrinologist here in town.  She's had no symptoms in the intervening 3 years.  Examination reveals localized tenderness in the right lower quadrant and suprapubic area.  WBC 21,000.  CT scan shows dilated appendix extending medially.  Platelet count 160,000.  She has been resuscitated with IV fluids and IV antibiotics and is brought to the operating room for appendectomy.  Operative Findings:       The appendix was acutely inflamed but was not gangrenous and did not appear perforated.  Terminal ileum and cecum looked normal.  The liver and gallbladder look normal.  Procedure in Detail:          Following the induction of general endotracheal anesthesia the patient's abdomen was prepped and draped in a sterile fashion.  She was up-to-date on her intravenous antibiotics.  Surgical timeout was performed.  0.25% Marcaine with epinephrine was used as a local infiltration anesthetic.  A vertical incision was made in the upper rim of the umbilicus.  The fascia was incised in the midline and the abdominal cavity entered under direct vision.  An 11 mm Hassan trocar was inserted and secured with the  Purstring suture of 0 Vicryl.  Pneumoperitoneum was created.  The patient was positioned.  Trochars placed in the left suprapubic area and the trocar placed in the left mid abdomen.  We pulled the omentum up and identified the cecum, terminal ileum, and teased  the inflamed appendix away from the structures.  We carefully divided the appendiceal mesentery with Harmonic scalpel.  We did this in several small steps until we had completely skeletonized the mesentery and could clearly see the junction of the appendix with the cecum.  The appendectomy was performed with an Endo GIA stapler on a blue load.  I took a small cuff of cecum.  After firing and removing the stapler the staple line looked excellent.  The appendix is placed in a specimen bag and removed.  The operative field was irrigated a little bit but actually looks quite nice and there was no bleeding or other problem.  The pneumoperitoneum was released and the trochars were removed.  The fascia at the umbilicus was closed with 0 Vicryl sutures.  Skin incisions were closed with subcuticular sutures of 4-0 Monocryl and Dermabond.  Patient tolerated the procedure well was taken  to PACU in stable condition.  EBL 10 mL.  Counts correct.  Complications none.     Angelia MouldHaywood M. Derrell LollingIngram, M.D., FACS General and Minimally Invasive Surgery Breast and Colorectal Surgery  01/14/2017 4:30 PM

## 2017-01-14 NOTE — Transfer of Care (Signed)
Immediate Anesthesia Transfer of Care Note  Patient: Kellie Shaffer  Procedure(s) Performed: Procedure(s): APPENDECTOMY LAPAROSCOPIC (N/A)  Patient Location: PACU  Anesthesia Type:General  Level of Consciousness:  sedated, patient cooperative and responds to stimulation  Airway & Oxygen Therapy:Patient Spontanous Breathing and Patient connected to face mask oxgen  Post-op Assessment:  Report given to PACU RN and Post -op Vital signs reviewed and stable  Post vital signs:  Reviewed and stable  Last Vitals:  Vitals:   01/14/17 1300 01/14/17 1630  BP: (!) 125/51 120/60  Pulse: 66 77  Resp:  12  Temp: 36.7 C 36.7 C    Complications: No apparent anesthesia complications

## 2017-01-14 NOTE — Anesthesia Procedure Notes (Signed)
Procedure Name: Intubation Date/Time: 01/14/2017 3:25 PM Performed by: West Pugh Pre-anesthesia Checklist: Patient identified, Emergency Drugs available, Suction available, Patient being monitored and Timeout performed Patient Re-evaluated:Patient Re-evaluated prior to inductionOxygen Delivery Method: Circle system utilized Preoxygenation: Pre-oxygenation with 100% oxygen Intubation Type: IV induction and Cricoid Pressure applied Ventilation: Mask ventilation without difficulty Laryngoscope Size: Mac and 4 Grade View: Grade II Tube type: Oral Tube size: 7.5 mm Number of attempts: 1 Airway Equipment and Method: Stylet Placement Confirmation: ETT inserted through vocal cords under direct vision,  positive ETCO2,  CO2 detector and breath sounds checked- equal and bilateral Secured at: 22 cm Tube secured with: Tape Dental Injury: Teeth and Oropharynx as per pre-operative assessment

## 2017-01-14 NOTE — H&P (Signed)
Kellie Shaffer is an 26 y.o. female.   Chief Complaint: RLQ pain HPI: Patient presented to ED with right lower quadrant abdominal pain that started yesterday and has gotten progressively worse throughout the day and night. She has had nausea but no vomiting or diarrhea. She reports decreased appetite. No fever. She denies vaginal discharge, dysuria. She states she has had appendicitis in the past but was found to have low platelets at the time and surgery was not performed, and it was treated with antibiotics.  Her platelets have since recovered and she has been discharged from her hematologist.  Past Medical History:  Diagnosis Date  . Abortion May 2013  . Anemia   . Atrial fibrillation ([place])   . Hypercholesteremia   . Hypertension   . Hyperthyroidism   . Iron deficiency anemia 09/04/2016  . Ovarian cyst     History reviewed. No pertinent surgical history.  Family History  Problem Relation Age of Onset  . Diabetes Other        Parent  . Cancer Other        Breast Cancer-Parent  . Hypertension Other        Parent  . Hyperlipidemia Other        Parent  . Arthritis Other        Parent   Social History:  reports that she has been smoking Cigarettes.  She has been smoking about 0.25 packs per day. She has never used smokeless tobacco. She reports that she drinks alcohol. She reports that she does not use drugs.  Allergies:  Allergies  Allergen Reactions  . Benadryl [Diphenhydramine Hcl] Anaphylaxis and Hives  . Chloroxine Hives    Clorox    Medications Prior to Admission  Medication Sig Dispense Refill  . acidophilus (RISAQUAD) CAPS capsule Take 1 capsule by mouth daily.    Marland Kitchen amLODipine (NORVASC) 5 MG tablet Take 1 tablet (5 mg total) by mouth daily. Quantity sufficient to get to PCP appt. 20 tablet 0  . cetirizine (ZYRTEC) 10 MG tablet Take 10 mg by mouth daily as needed for allergies.     Marland Kitchen ibuprofen (ADVIL,MOTRIN) 200 MG tablet Take 600 mg by mouth every 6 (six) hours  as needed for moderate pain.    . IRON PO Take 1 tablet by mouth daily.     . methimazole (TAPAZOLE) 5 MG tablet Take 2.5 mg by mouth 3 (three) times daily.    . metoprolol tartrate (LOPRESSOR) 25 MG tablet Take 12.5 mg by mouth 2 (two) times daily.    . Multiple Vitamin (MULTIVITAMIN) capsule Take 1 capsule by mouth daily.      Results for orders placed or performed during the hospital encounter of 01/13/17 (from the past 48 hour(s))  Lipase, blood     Status: None   Collection Time: 01/14/17 12:28 AM  Result Value Ref Range   Lipase 20 11 - 51 U/L  Comprehensive metabolic panel     Status: Abnormal   Collection Time: 01/14/17 12:28 AM  Result Value Ref Range   Sodium 137 135 - 145 mmol/L   Potassium 3.2 (L) 3.5 - 5.1 mmol/L   Chloride 102 101 - 111 mmol/L   CO2 24 22 - 32 mmol/L   Glucose, Bld 110 (H) 65 - 99 mg/dL   BUN 13 6 - 20 mg/dL   Creatinine, Ser 0.60 0.44 - 1.00 mg/dL   Calcium 9.4 8.9 - 10.3 mg/dL   Total Protein 9.1 (H) 6.5 - 8.1 g/dL  Albumin 4.3 3.5 - 5.0 g/dL   AST 13 (L) 15 - 41 U/L   ALT 20 14 - 54 U/L   Alkaline Phosphatase 61 38 - 126 U/L   Total Bilirubin 0.4 0.3 - 1.2 mg/dL   GFR calc non Af Amer >60 >60 mL/min   GFR calc Af Amer >60 >60 mL/min    Comment: (NOTE) The eGFR has been calculated using the CKD EPI equation. This calculation has not been validated in all clinical situations. eGFR's persistently <60 mL/min signify possible Chronic Kidney Disease.    Anion gap 11 5 - 15  CBC     Status: Abnormal   Collection Time: 01/14/17 12:28 AM  Result Value Ref Range   WBC 21.5 (H) 4.0 - 10.5 K/uL   RBC 4.72 3.87 - 5.11 MIL/uL   Hemoglobin 12.4 12.0 - 15.0 g/dL   HCT 37.0 36.0 - 46.0 %   MCV 78.4 78.0 - 100.0 fL   MCH 26.3 26.0 - 34.0 pg   MCHC 33.5 30.0 - 36.0 g/dL   RDW 14.8 11.5 - 15.5 %   Platelets 162 150 - 400 K/uL  Urinalysis, Routine w reflex microscopic     Status: Abnormal   Collection Time: 01/14/17 12:46 AM  Result Value Ref Range    Color, Urine AMBER (A) YELLOW    Comment: BIOCHEMICALS MAY BE AFFECTED BY COLOR   APPearance CLEAR CLEAR   Specific Gravity, Urine 1.030 1.005 - 1.030   pH 5.0 5.0 - 8.0   Glucose, UA NEGATIVE NEGATIVE mg/dL   Hgb urine dipstick NEGATIVE NEGATIVE   Bilirubin Urine NEGATIVE NEGATIVE   Ketones, ur NEGATIVE NEGATIVE mg/dL   Protein, ur 30 (A) NEGATIVE mg/dL   Nitrite NEGATIVE NEGATIVE   Leukocytes, UA TRACE (A) NEGATIVE   RBC / HPF 0-5 0 - 5 RBC/hpf   WBC, UA 0-5 0 - 5 WBC/hpf   Bacteria, UA NONE SEEN NONE SEEN   Squamous Epithelial / LPF 0-5 (A) NONE SEEN   Mucous PRESENT   POC urine preg, ED     Status: None   Collection Time: 01/14/17 12:46 AM  Result Value Ref Range   Preg Test, Ur NEGATIVE NEGATIVE    Comment:        THE SENSITIVITY OF THIS METHODOLOGY IS >24 mIU/mL   Wet prep, genital     Status: Abnormal   Collection Time: 01/14/17  4:10 AM  Result Value Ref Range   Yeast Wet Prep HPF POC NONE SEEN NONE SEEN   Trich, Wet Prep NONE SEEN NONE SEEN   Clue Cells Wet Prep HPF POC PRESENT (A) NONE SEEN   WBC, Wet Prep HPF POC MANY (A) NONE SEEN   Sperm NONE SEEN    Ct Abdomen Pelvis W Contrast  Result Date: 01/14/2017 CLINICAL DATA:  RIGHT lower quadrant pain and leukocytosis. History of ovarian cyst. EXAM: CT ABDOMEN AND PELVIS WITH CONTRAST TECHNIQUE: Multidetector CT imaging of the abdomen and pelvis was performed using the standard protocol following bolus administration of intravenous contrast. CONTRAST:  100 cc Isovue-300 COMPARISON:  CT abdomen and pelvis May 26, 2014 FINDINGS: LOWER CHEST: Lung bases are clear. Included heart size is normal. No pericardial effusion. HEPATOBILIARY: Liver is mildly enlarged, 23 cm in cranial caudad dimension. Gallbladder are normal. PANCREAS: Normal. SPLEEN: Normal. ADRENALS/URINARY TRACT: Kidneys are orthotopic, demonstrating symmetric enhancement. No nephrolithiasis, hydronephrosis or solid renal masses. The unopacified ureters are  normal in course and caliber. Urinary bladder is partially distended and  unremarkable. Normal adrenal glands. STOMACH/BOWEL: The stomach, small and large bowel are normal in course and caliber without inflammatory changes. The appendix is enlarged, 12 mm in transaxial dimension with 9 mm appendicolith at the origin. Mild periappendiceal inflammation and trace free fluid. VASCULAR/LYMPHATIC: Aortoiliac vessels are normal in course and caliber. No lymphadenopathy by CT size criteria. REPRODUCTIVE: Normal. OTHER: No  free air. MUSCULOSKELETAL: Nonacute. Similar RIGHT inferior sacroiliac bridging osteophyte. Mild bilateral hip osteoarthrosis. IMPRESSION: Early acute appendicitis without complication. Mild hepatomegaly. Acute findings discussed with and reconfirmed by PA.SHARI UPSTILL on 01/14/2017 at 2:46 am. Electronically Signed   By: Elon Alas M.D.   On: 01/14/2017 02:47    Review of Systems  Constitutional: Negative for chills and fever.  Eyes: Negative for blurred vision and double vision.  Respiratory: Negative for cough and shortness of breath.   Cardiovascular: Negative for chest pain and palpitations.  Gastrointestinal: Positive for abdominal pain and nausea. Negative for diarrhea and vomiting.  Genitourinary: Negative for dysuria, frequency and urgency.  Musculoskeletal: Negative for myalgias.  Skin: Negative for itching and rash.  Neurological: Negative for dizziness.    Blood pressure 117/62, pulse 70, temperature 97.3 F (36.3 C), temperature source Oral, resp. rate (!) 24, SpO2 99 %. Physical Exam  Constitutional: She is oriented to person, place, and time. She appears well-developed and well-nourished. No distress.  HENT:  Head: Normocephalic and atraumatic.  Eyes: Conjunctivae and EOM are normal. Pupils are equal, round, and reactive to light.  Neck: Normal range of motion. Neck supple.  Cardiovascular: Normal rate and regular rhythm.   Respiratory: Effort normal and breath  sounds normal. No respiratory distress.  GI: Soft. She exhibits no distension. There is tenderness.  Musculoskeletal: Normal range of motion.  Neurological: She is alert and oriented to person, place, and time.     Assessment/Plan 26 y.o. F with what appears to be recurrent appendicitis.  Pt admitted and placed on IV abx.  Cont NPO for surgery later today by Dr Dalbert Batman.   Rosario Adie., MD 2/88/3374, 6:45 AM

## 2017-01-14 NOTE — Progress Notes (Addendum)
General Surgery:  I have interviewed and examined this patient this morning I have reviewed her physical exam, lab work, and CT scan  She has a past history of appendicitis and states that I saw her a few years ago but we canceled surgery because of thrombocytopenia, and hyperthyroidism which was treated by Dr. Romero BellingSean Ellison.  Thrombocytopenia has resolved. Last TSH 0.24 on 10/19/2016.  Platelet count 160,000  She's had abdominal pain for 24-36 hours.  Mostly right lower quadrant. No prior abdominal surgery Exam reveals localized tenderness in the right lower quadrant and suprapubic area W BC 21,000 CT scan shows dilated appendix extending medially  Assessment/plan:    There is a high probability that she has recurrent acute appendicitis    She has been started on IV fluids and IV antibiotics (Rocephin and Flagyl have been started.)    I have advised taking her to the OR later today for laparoscopic appendectomy, possible open appendectomy    I have discussed the indications, details, techniques, and numerous risk of the surgery with her and her sister who is in the room.  She's aware of the risk of bleeding, infection, conversion to open laparotomy, injury to adjacent organs, wound healing problems such as wound infection or hernia, cardiac pulmonary and thromboembolic problem is.  She understands these issues well.  All of her questions are answered.  She agrees with this plan.     I have told her to take the jewelry out of her umbilicus.   Angelia MouldHaywood M. Derrell LollingIngram, M.D., [facility] General and Minimally invasive Surgery Breast and Colorectal Surgery Office:   [phone] Pager:   [phone]

## 2017-01-15 ENCOUNTER — Encounter (HOSPITAL_COMMUNITY): Payer: Self-pay | Admitting: General Surgery

## 2017-01-15 LAB — GC/CHLAMYDIA PROBE AMP (~~LOC~~) NOT AT ARMC
Chlamydia: NEGATIVE
Neisseria Gonorrhea: NEGATIVE

## 2017-01-15 MED ORDER — HYDROCODONE-ACETAMINOPHEN 5-325 MG PO TABS: 1.0000 | ORAL_TABLET | ORAL | 0 refills | Status: DC | PRN

## 2017-01-15 MED FILL — HYDROCODON-APAP 5-325: 5-325 | 2 days supply | Qty: 15 | Fill #0

## 2017-01-15 NOTE — Discharge Instructions (Signed)

## 2017-01-15 NOTE — Progress Notes (Signed)
1 Day Post-Op  Subjective: Doing well postop.  Asking to go home.  Feels much better than preop Ambulating to bathroom and voiding without difficulty Tolerating clear liquids but has not advance diet yet.  Plan regular diet at breakfast  Objective: Vital signs in last 24 hours: Temp:  [97.7 F (36.5 C)-99.1 F (37.3 C)] 97.8 F (36.6 C) (06/01 0112) Pulse Rate:  [58-77] 64 (06/01 0112) Resp:  [12-24] 15 (06/01 0112) BP: (109-125)/(51-86) 123/62 (06/01 0112) SpO2:  [98 %-100 %] 98 % (06/01 0112) Last BM Date: 01/12/17  Intake/Output from previous day: 05/31 0701 - 06/01 0700 In: 3767.1 [P.O.:840; I.V.:2727.1; IV Piggyback:200] Out: 3650 [Urine:3650] Intake/Output this shift: Total I/O In: 1683.3 [P.O.:600; I.V.:983.3; IV Piggyback:100] Out: 3150 [Urine:3150]  General appearance: Alert.  Looks quite comfortable and in no distress.  Mental status normal Resp: clear to auscultation bilaterally GI: Soft.  Trocar sites look good.  Minimal right lower quadrant tenderness  Lab Results:  Results for orders placed or performed during the hospital encounter of 01/13/17 (from the past 24 hour(s))  HIV antibody (Routine Testing)     Status: None   Collection Time: 01/14/17  7:49 AM  Result Value Ref Range   HIV Screen 4th Generation wRfx Non Reactive Non Reactive     Studies/Results: No results found.  . cetirizine  10 mg Oral Daily  . enoxaparin (LOVENOX) injection  40 mg Subcutaneous Q24H  . hydrocortisone   Topical BID     Assessment/Plan: s/p Procedure(s): APPENDECTOMY LAPAROSCOPIC  POD #1.  Laparoscopic appendectomy for acute appendicitis Satisfactory progress overnight Once tolerates regular diet she will meet all discharge criteria Will return later this morning to reassess and discharge assuming she has no problems with diet. We'll arrange follow-up in office in about 3 weeks Diet and activities discussed  @PROBHOSP @  LOS: 1 day    Kellie Shaffer  M 01/15/2017  . .prob

## 2017-02-18 ENCOUNTER — Ambulatory Visit (INDEPENDENT_AMBULATORY_CARE_PROVIDER_SITE_OTHER): Payer: Self-pay | Admitting: Endocrinology

## 2017-02-18 ENCOUNTER — Encounter: Payer: Self-pay | Admitting: Endocrinology

## 2017-02-18 VITALS — BP 132/80 | HR 74 | Ht 69.0 in | Wt 229.0 lb

## 2017-02-18 DIAGNOSIS — E0590 Thyrotoxicosis, unspecified without thyrotoxic crisis or storm: Secondary | ICD-10-CM

## 2017-02-18 LAB — TSH: TSH: 0.04 u[IU]/mL — ABNORMAL LOW (ref 0.35–4.50)

## 2017-02-18 LAB — T4, FREE: FREE T4: 1 ng/dL (ref 0.60–1.60)

## 2017-02-18 MED ORDER — METHIMAZOLE 10 MG PO TABS: 10.0000 mg | ORAL_TABLET | Freq: Two times a day (BID) | ORAL | 5 refills | Status: DC

## 2017-02-18 NOTE — Progress Notes (Signed)
Subjective:    Patient ID: Kellie Shaffer, female    DOB: [birth year], 26 y.o.   MRN: [id number]  HPI Pt returns for f/u of hyperthyroidism (dx'ed 2013, during a pregnancy; US showed a small multinodular goiter, but she also has proptosis; she was then rx'ed with tapazole; she was lost to f/u, but hyperthyroidism was found again during a hospitalization in late 2015 for appendicitis; she was restarted on tapazole then; pt says she is not at risk for another pregnancy; f/u TFT in 2018 were normal off rx; she says she is not at risk for pregnancy).   PCP resumed tapazole 2-3 mos ago. She takes 2.5 mg TID.  Since back on rx, pt states she feels well, except for slight palpitaions in the chest.   Past Medical History:  Diagnosis Date  . Abortion May 2013  . Anemia   . Atrial fibrillation ([facility])   . Hypercholesteremia   . Hypertension   . Hyperthyroidism   . Iron deficiency anemia 09/04/2016  . Ovarian cyst     Past Surgical History:  Procedure Laterality Date  . LAPAROSCOPIC APPENDECTOMY N/A 01/14/2017   Procedure: APPENDECTOMY LAPAROSCOPIC;  Surgeon: Claud KelpIngram, Haywood, MD;  Location: [facility];  Service: General;  Laterality: N/A;    Social History   Social History  . Marital status: Single    Spouse name: N/A  . Number of children: N/A  . Years of education: 5014   Occupational History  . [employer]    Social History Main Topics  . Smoking status: Current Every Day Smoker    Packs/day: 0.25    Types: Cigarettes  . Smokeless tobacco: Never Used  . Alcohol use 0.0 oz/week     Comment: occasionally  . Drug use: No  . Sexual activity: Yes    Birth control/ protection: Condom   Other Topics Concern  . Not on file   Social History Narrative   Regular exercise-yes   Caffeine Use-yes          Current Outpatient Prescriptions on File Prior to Visit  Medication Sig Dispense Refill  . amLODipine (NORVASC) 5 MG tablet Take 1 tablet (5 mg total) by mouth daily. Quantity  sufficient to get to PCP appt. (Patient taking differently: Take 5 mg by mouth 2 (two) times daily. Quantity sufficient to get to PCP appt.) 20 tablet 0  . cetirizine (ZYRTEC) 10 MG tablet Take 10 mg by mouth daily as needed for allergies.     . IRON PO Take 1 tablet by mouth daily.     . metoprolol tartrate (LOPRESSOR) 25 MG tablet Take 12.5 mg by mouth 2 (two) times daily.     No current facility-administered medications on file prior to visit.     Allergies  Allergen Reactions  . Benadryl [Diphenhydramine Hcl] Anaphylaxis and Hives  . Chloroxine Hives    Clorox    Family History  Problem Relation Age of Onset  . Diabetes Other        Parent  . Cancer Other        Breast Cancer-Parent  . Hypertension Other        Parent  . Hyperlipidemia Other        Parent  . Arthritis Other        Parent    BP 132/80   Pulse 74   Ht 5\' 9"  (1.753 m)   Wt 229 lb (103.9 kg)   SpO2 98%   BMI 33.82 kg/m  Review of Systems Denies fever.    Objective:   Physical Exam VITAL SIGNS:  See vs page GENERAL: no distress eyes: no periorbital swelling, no proptosis  NECK: There is no palpable thyroid enlargement.  No thyroid nodule is palpable.  No palpable lymphadenopathy at the anterior neck.   Lab Results  Component Value Date   TSH 0.04 (L) 02/18/2017      Assessment & Plan:  Hyperthyroidism, recurrent. Increase tapazole to 10 mg BID.

## 2017-02-18 NOTE — Patient Instructions (Signed)
blood tests are requested for you today.  We'll let you know about the results.  Please come back for a follow-up appointment in 3 months.  If ever you have fever while taking methimazole, stop it and call us, even if the reason is obvious, because of the risk of a rare side-effect.    

## 2017-03-23 ENCOUNTER — Telehealth: Payer: Self-pay | Admitting: Endocrinology

## 2017-03-23 ENCOUNTER — Other Ambulatory Visit: Payer: Self-pay

## 2017-03-23 MED ORDER — METHIMAZOLE 10 MG PO TABS: 10.0000 mg | ORAL_TABLET | Freq: Two times a day (BID) | ORAL | 5 refills | Status: DC

## 2017-03-23 NOTE — Telephone Encounter (Signed)
Patient says Dr. Everardo AllEllison was supposed to [facility] in [facility] 10mg  [facility] [address] since 07/05 after last visit, but it's not at the pharmacy.  Please leave a detailed message if no answer. Please advise,  -LL

## 2017-03-23 NOTE — Telephone Encounter (Signed)
Called and left patient a VM that prescription was sent in on 7/5 but I sent another prescription over as well.

## 2017-03-25 ENCOUNTER — Other Ambulatory Visit: Payer: Self-pay

## 2017-03-25 MED ORDER — METHIMAZOLE 10 MG PO TABS
10.0000 mg | ORAL_TABLET | Freq: Two times a day (BID) | ORAL | 5 refills | Status: DC
Start: 2017-03-25 — End: 2017-03-28

## 2017-03-27 ENCOUNTER — Encounter (HOSPITAL_COMMUNITY): Payer: Self-pay

## 2017-03-27 DIAGNOSIS — I10 Essential (primary) hypertension: Secondary | ICD-10-CM | POA: Insufficient documentation

## 2017-03-27 DIAGNOSIS — F17210 Nicotine dependence, cigarettes, uncomplicated: Secondary | ICD-10-CM | POA: Insufficient documentation

## 2017-03-27 DIAGNOSIS — Z79899 Other long term (current) drug therapy: Secondary | ICD-10-CM | POA: Insufficient documentation

## 2017-03-27 NOTE — ED Triage Notes (Signed)
States today feeling dizzy today like she is going to pass out has hx of afib but not on medication.  Alert and oriented x 3.

## 2017-03-28 ENCOUNTER — Emergency Department (HOSPITAL_COMMUNITY)
Admission: EM | Admit: 2017-03-28 | Discharge: 2017-03-28 | Disposition: A | Discharge status: Home / Self Care | Payer: Self-pay | Attending: Emergency Medicine | Admitting: Emergency Medicine

## 2017-03-28 DIAGNOSIS — R42 Dizziness and giddiness: Secondary | ICD-10-CM

## 2017-03-28 DIAGNOSIS — I10 Essential (primary) hypertension: Secondary | ICD-10-CM

## 2017-03-28 LAB — CBC
HEMATOCRIT: 39.3 % (ref 36.0–46.0)
Hemoglobin: 13.3 g/dL (ref 12.0–15.0)
MCH: 26.1 pg (ref 26.0–34.0)
MCHC: 33.8 g/dL (ref 30.0–36.0)
MCV: 77.2 fL — AB (ref 78.0–100.0)
PLATELETS: 107 10*3/uL — AB (ref 150–400)
RBC: 5.09 MIL/uL (ref 3.87–5.11)
RDW: 14.3 % (ref 11.5–15.5)
WBC: 8.3 10*3/uL (ref 4.0–10.5)

## 2017-03-28 LAB — URINALYSIS, ROUTINE W REFLEX MICROSCOPIC
Bilirubin Urine: NEGATIVE
Glucose, UA: NEGATIVE mg/dL
Hgb urine dipstick: NEGATIVE
Ketones, ur: NEGATIVE mg/dL
Leukocytes, UA: NEGATIVE
NITRITE: NEGATIVE
PH: 5 (ref 5.0–8.0)
Protein, ur: NEGATIVE mg/dL
SPECIFIC GRAVITY, URINE: 1.018 (ref 1.005–1.030)

## 2017-03-28 LAB — BASIC METABOLIC PANEL
Anion gap: 11 (ref 5–15)
BUN: 10 mg/dL (ref 6–20)
CHLORIDE: 102 mmol/L (ref 101–111)
CO2: 25 mmol/L (ref 22–32)
CREATININE: 0.73 mg/dL (ref 0.44–1.00)
Calcium: 10 mg/dL (ref 8.9–10.3)
GFR calc Af Amer: >60 mL/min (ref >60)
Glucose, Bld: 117 mg/dL — ABNORMAL HIGH (ref 65–99)
POTASSIUM: 3.1 mmol/L — AB (ref 3.5–5.1)
SODIUM: 138 mmol/L (ref 135–145)

## 2017-03-28 LAB — TSH: TSH: 0.049 u[IU]/mL — AB (ref 0.350–4.500)

## 2017-03-28 MED ORDER — METHIMAZOLE 10 MG PO TABS
10.0000 mg | ORAL_TABLET | Freq: Two times a day (BID) | ORAL | Status: DC
  Administered 2017-03-28: 10 mg via ORAL
  Filled 2017-03-28: qty 1

## 2017-03-28 MED ORDER — METOPROLOL TARTRATE 25 MG PO TABS: 12.5000 mg | ORAL_TABLET | Freq: Two times a day (BID) | ORAL | 1 refills | Status: DC

## 2017-03-28 MED ORDER — METHIMAZOLE 10 MG PO TABS: 10.0000 mg | ORAL_TABLET | Freq: Two times a day (BID) | ORAL | 5 refills | Status: DC

## 2017-03-28 MED ORDER — SODIUM CHLORIDE 0.9 % IV BOLUS (SEPSIS)
1000.0000 mL | Freq: Once | INTRAVENOUS | Status: AC
  Administered 2017-03-28: 1000 mL via INTRAVENOUS

## 2017-03-28 MED ORDER — POTASSIUM CHLORIDE CRYS ER 20 MEQ PO TBCR
40.0000 meq | EXTENDED_RELEASE_TABLET | Freq: Once | ORAL | Status: AC
  Administered 2017-03-28: 40 meq via ORAL
  Filled 2017-03-28: qty 2

## 2017-03-28 MED ORDER — AMLODIPINE BESYLATE 5 MG PO TABS: 5.0000 mg | ORAL_TABLET | Freq: Every day | ORAL | 1 refills | Status: DC

## 2017-03-28 MED ORDER — METOPROLOL TARTRATE 25 MG PO TABS
12.5000 mg | ORAL_TABLET | Freq: Two times a day (BID) | ORAL | Status: DC
  Administered 2017-03-28: 12.5 mg via ORAL
  Filled 2017-03-28: qty 1

## 2017-03-28 MED ORDER — AMLODIPINE BESYLATE 5 MG PO TABS
5.0000 mg | ORAL_TABLET | Freq: Every day | ORAL | Status: DC
  Administered 2017-03-28: 5 mg via ORAL
  Filled 2017-03-28: qty 1

## 2017-03-28 NOTE — Discharge Instructions (Signed)
Take your medications as prescribed. Follow-up with your primary care doctor is soon as you are able. Drink plenty of water to prevent dehydration. Follow up with a primary care doctor to ensure resolution of symptoms.

## 2017-03-28 NOTE — ED Provider Notes (Signed)
[facility]-EMERGENCY DEPT Provider Note   CSN: [id number] Arrival date & time: 03/27/17  2233    History   Chief Complaint Chief Complaint  Patient presents with  . Dizziness    HPI Kellie Shaffer is a 26 y.o. female.  26 year old female with a history of hyperthyroidism, dyslipidemia, atrial fibrillation, and hypertension presents to the emergency department for evaluation of dizziness and lightheadedness. Patient states that she has felt these symptoms since waking this morning. They have been constant and waxing and waning in severity. She states that she initially felt as though the room was spinning, but this has subsided. She has now continued to feel more lightheaded and fatigued throughout the day. This has been associated with mild nausea. She has not had any near syncopal or syncopal events. No chest pain, palpitations, shortness of breath, fever, vomiting, abdominal pain. She reports noncompliance with her thyroid medication x 1 month. She has been off her metoprolol for a few months.      Past Medical History:  Diagnosis Date  . Abortion May 2013  . Anemia   . Atrial fibrillation (HCC)   . Hypercholesteremia   . Hypertension   . Hyperthyroidism   . Iron deficiency anemia 09/04/2016  . Ovarian cyst     Patient Active Problem List   Diagnosis Date Noted  . Acute appendicitis 01/14/2017  . Iron deficiency anemia 09/04/2016  . Atrial flutter (HCC) 07/05/2014  . Hyperthyroidism 07/05/2014  . Thrombocytopenia (HCC) 05/27/2014  . Acute appendicitis, uncomplicated 05/26/2014  . Hypercholesteremia     Past Surgical History:  Procedure Laterality Date  . LAPAROSCOPIC APPENDECTOMY N/A 01/14/2017   Procedure: APPENDECTOMY LAPAROSCOPIC;  Surgeon: Claud KelpIngram, Haywood, MD;  Location: [facility];  Service: General;  Laterality: N/A;    OB History    Gravida Para Term Preterm AB Living   1             SAB TAB Ectopic Multiple Live Births                   Home  Medications    Prior to Admission medications   Medication Sig Start Date End Date Taking? Authorizing Provider  Biotin 9629510000 MCG TABS Take 1,000 mcg by mouth daily.    Yes [provider]  cetirizine (ZYRTEC) 10 MG tablet Take 10 mg by mouth daily as needed for allergies.    Yes [provider]  hydrochlorothiazide (HYDRODIURIL) 25 MG tablet Take 25 mg by mouth daily.    Yes [provider]  IRON PO Take 1 tablet by mouth daily.    Yes [provider]  amLODipine (NORVASC) 5 MG tablet Take 1 tablet (5 mg total) by mouth daily. 03/28/17   Antony MaduraHumes, Pervis Macintyre, PA-C  methimazole (TAPAZOLE) 10 MG tablet Take 1 tablet (10 mg total) by mouth 2 (two) times daily. 03/28/17   Antony MaduraHumes, Citlaly Camplin, PA-C  metoprolol tartrate (LOPRESSOR) 25 MG tablet Take 0.5 tablets (12.5 mg total) by mouth 2 (two) times daily. 03/28/17   Antony MaduraHumes, Kharter Brew, PA-C    Family History Family History  Problem Relation Age of Onset  . Diabetes Other        Parent  . Cancer Other        Breast Cancer-Parent  . Hypertension Other        Parent  . Hyperlipidemia Other        Parent  . Arthritis Other        Parent    Social History Social  History  Substance Use Topics  . Smoking status: Current Every Day Smoker    Packs/day: 0.25    Types: Cigarettes  . Smokeless tobacco: Never Used  . Alcohol use 0.0 oz/week     Comment: occasionally     Allergies   Benadryl [diphenhydramine hcl] and Chloroxine   Review of Systems Review of Systems Ten systems reviewed and are negative for acute change, except as noted in the HPI.    Physical Exam Updated Vital Signs BP 138/90   Pulse 78   Temp 98.4 F (36.9 C) (Oral)   Resp 13   Ht 5\' 9"  (1.753 m)   Wt 99.8 kg (220 lb)   SpO2 99%   BMI 32.49 kg/m   Physical Exam  Constitutional: She is oriented to person, place, and time. She appears well-developed and well-nourished. No distress.  Nontoxic and in NAD  HENT:  Head: Normocephalic and  atraumatic.  Eyes: Conjunctivae and EOM are normal. No scleral icterus.  Neck: Normal range of motion.  Cardiovascular: Normal rate, regular rhythm and intact distal pulses.   Pulmonary/Chest: Effort normal. No respiratory distress. She has no wheezes. She has no rales.  Respirations even and unlabored  Abdominal: Soft. She exhibits no distension and no mass. There is no tenderness. There is no guarding.  Soft, nontender abdomen. No distension or peritoneal signs.  Musculoskeletal: Normal range of motion.  Neurological: She is alert and oriented to person, place, and time. She exhibits normal muscle tone. Coordination normal.  GCS 15. Patient moving all extremities.  Skin: Skin is warm and dry. No rash noted. She is not diaphoretic. No erythema. No pallor.  Psychiatric: She has a normal mood and affect. Her behavior is normal.  Nursing note and vitals reviewed.    ED Treatments / Results  Labs (all labs ordered are listed, but only abnormal results are displayed) Labs Reviewed  BASIC METABOLIC PANEL - Abnormal; Notable for the following:       Result Value   Potassium 3.1 (*)    Glucose, Bld 117 (*)    All other components within normal limits  CBC - Abnormal; Notable for the following:    MCV 77.2 (*)    Platelets 107 (*)    All other components within normal limits  TSH - Abnormal; Notable for the following:    TSH 0.049 (*)    All other components within normal limits  URINALYSIS, ROUTINE W REFLEX MICROSCOPIC    EKG ED ECG REPORT   Date: 03/28/2017  Rate: 82  Rhythm: normal sinus rhythm  QRS Axis: normal  Intervals: normal  ST/T Wave abnormalities: nonspecific T wave changes  Conduction Disutrbances:RSR' in V1 or V2  Narrative Interpretation: Normal sinus rhythm with probable left atrial enlargement, RSR' in V1 or V2, borderline T wave abnormalities.  Old EKG Reviewed: largely unchanged; borderline LAD no longer present  I have personally reviewed the EKG tracing  and agree with the computerized printout as noted.   Radiology No results found.  Procedures Procedures (including critical care time)  Medications Ordered in ED Medications  metoprolol tartrate (LOPRESSOR) tablet 12.5 mg (12.5 mg Oral Given 03/28/17 0240)  amLODipine (NORVASC) tablet 5 mg (5 mg Oral Given 03/28/17 0240)  methimazole (TAPAZOLE) tablet 10 mg (10 mg Oral Given 03/28/17 0236)  sodium chloride 0.9 % bolus 1,000 mL (1,000 mLs Intravenous New Bag/Given 03/28/17 0238)  potassium chloride SA (K-DUR,KLOR-CON) CR tablet 40 mEq (40 mEq Oral Given 03/28/17 0227)  Initial Impression / Assessment and Plan / ED Course  I have reviewed the triage vital signs and the nursing notes.  Pertinent labs & imaging results that were available during my care of the patient were reviewed by me and considered in my medical decision making (see chart for details).     26 year old female presents to the emergency department for nonspecific lightheadedness. She has a reassuring physical exam as well as stable vital signs. No signs of orthostasis. Patient hydrated in the emergency department with 1 L IV fluids. She was also given oral potassium for mild hypokalemia of 3.1. Suspect symptoms may be related to medication noncompliance. The patient has been off of her antihypertensives as well as her hyperthyroid medication for at least one month. She has been restarted on these medications today.  Given reassuring evaluation, I believe the patient is appropriate for additional follow-up with her primary care doctor. On repeat assessment, the patient is sleeping and in no distress. She states that she is feeling better compared to arrival. Prescriptions given for Norvasc, metoprolol, and methimazole. Return precautions discussed and provided. Patient discharged in stable condition with no unaddressed concerns.   Final Clinical Impressions(s) / ED Diagnoses   Final diagnoses:  Lightheadedness   Hypertension not at goal    New Prescriptions Current Discharge Medication List       Antony Madura, PA-C 03/28/17 0403    Shon Baton, MD 03/28/17 [phone]

## 2017-03-28 NOTE — ED Notes (Signed)
Pt resting on side with eyes closed. Fluids almost complete. Plan to d/c home when fluids complete.

## 2017-05-21 ENCOUNTER — Ambulatory Visit (INDEPENDENT_AMBULATORY_CARE_PROVIDER_SITE_OTHER): Payer: Self-pay | Admitting: Endocrinology

## 2017-05-21 ENCOUNTER — Encounter: Payer: Self-pay | Admitting: Endocrinology

## 2017-05-21 VITALS — BP 120/84 | HR 72 | Wt 228.0 lb

## 2017-05-21 DIAGNOSIS — E0590 Thyrotoxicosis, unspecified without thyrotoxic crisis or storm: Secondary | ICD-10-CM

## 2017-05-21 LAB — T4, FREE: Free T4: 1.06 ng/dL (ref 0.60–1.60)

## 2017-05-21 LAB — TSH: TSH: 0.97 u[IU]/mL (ref 0.35–4.50)

## 2017-05-21 MED ORDER — METOPROLOL TARTRATE 25 MG PO TABS: 12.5000 mg | ORAL_TABLET | ORAL | 5 refills | Status: DC

## 2017-05-21 NOTE — Progress Notes (Signed)
Subjective:    Patient ID: Kellie Shaffer, female    DOB: 03/26/1991, 26 y.o.   MRN: [id number]  HPI Pt returns for f/u of hyperthyroidism (dx'ed 2013, during a pregnancy; US showed a small multinodular goiter, but she also has proptosis; she was then rx'ed with tapazole; she was lost to f/u, but hyperthyroidism was found again during a hospitalization in late 2015 for appendicitis; she was restarted on tapazole then; pt says she is not at risk for another pregnancy; f/u TFT in 2018 were normal off rx; she says she is not at risk for pregnancy).   pt states she feels well in general.  Specifically, she denies palpitations, tremor, and excessive diaphoresis.  Past Medical History:  Diagnosis Date  . Abortion May 2013  . Anemia   . Atrial fibrillation (HCC)   . Hypercholesteremia   . Hypertension   . Hyperthyroidism   . Iron deficiency anemia 09/04/2016  . Ovarian cyst     Past Surgical History:  Procedure Laterality Date  . LAPAROSCOPIC APPENDECTOMY N/A 01/14/2017   Procedure: APPENDECTOMY LAPAROSCOPIC;  Surgeon: Claud Kelp, MD;  Location: [facility];  Service: General;  Laterality: N/A;    Social History   Social History  . Marital status: Single    Spouse name: N/A  . Number of children: N/A  . Years of education: 21   Occupational History  . [employer]    Social History Main Topics  . Smoking status: Current Every Day Smoker    Packs/day: 0.25    Types: Cigarettes  . Smokeless tobacco: Never Used  . Alcohol use 0.0 oz/week     Comment: occasionally  . Drug use: No  . Sexual activity: Yes    Birth control/ protection: Condom   Other Topics Concern  . Not on file   Social History Narrative   Regular exercise-yes   Caffeine Use-yes          Current Outpatient Prescriptions on File Prior to Visit  Medication Sig Dispense Refill  . amLODipine (NORVASC) 5 MG tablet Take 1 tablet (5 mg total) by mouth daily. 30 tablet 1  . Biotin 40981 MCG TABS Take  1,000 mcg by mouth daily.     . cetirizine (ZYRTEC) 10 MG tablet Take 10 mg by mouth daily as needed for allergies.     . hydrochlorothiazide (HYDRODIURIL) 25 MG tablet Take 25 mg by mouth daily.     . IRON PO Take 1 tablet by mouth daily.     . methimazole (TAPAZOLE) 10 MG tablet Take 1 tablet (10 mg total) by mouth 2 (two) times daily. 60 tablet 5   No current facility-administered medications on file prior to visit.     Allergies  Allergen Reactions  . Benadryl [Diphenhydramine Hcl] Anaphylaxis and Hives  . Chloroxine Hives    Clorox    Family History  Problem Relation Age of Onset  . Diabetes Other        Parent  . Cancer Other        Breast Cancer-Parent  . Hypertension Other        Parent  . Hyperlipidemia Other        Parent  . Arthritis Other        Parent    BP 120/84   Pulse 72   Wt 228 lb (103.4 kg)   SpO2 99%   BMI 33.67 kg/m   Review of Systems Denies fever    Objective:   Physical Exam  VITAL SIGNS:  See vs page GENERAL: no distress eyes: no periorbital swelling, but there is bilat proptosis  NECK: There is no palpable thyroid enlargement.  No thyroid nodule is palpable.  No palpable lymphadenopathy at the anterior neck.      Assessment & Plan:  Hyperthyroidism: due for recheck  Patient Instructions  blood tests are requested for you today.  We'll let you know about the results. Please come back for a follow-up appointment in 4 months If ever you have fever while taking methimazole, stop it and call us, even if the reason is obvious, because of the risk of a rare side-effect.  '

## 2017-05-21 NOTE — Patient Instructions (Signed)
blood tests are requested for you today.  We'll let you know about the results. Please come back for a follow-up appointment in 4 months If ever you have fever while taking methimazole, stop it and call us, even if the reason is obvious, because of the risk of a rare side-effect.

## 2017-06-17 ENCOUNTER — Telehealth: Payer: Self-pay | Admitting: Internal Medicine

## 2017-06-17 NOTE — Telephone Encounter (Signed)
Patient called to schedule an appointment. Scheduled 11/26.

## 2017-06-21 ENCOUNTER — Encounter (HOSPITAL_COMMUNITY): Payer: Self-pay | Admitting: Family Medicine

## 2017-06-21 ENCOUNTER — Emergency Department (HOSPITAL_COMMUNITY)
Admission: EM | Admit: 2017-06-21 | Discharge: 2017-06-22 | Disposition: A | Discharge status: Home / Self Care | Payer: Self-pay | Attending: Emergency Medicine | Admitting: Emergency Medicine

## 2017-06-21 DIAGNOSIS — H5789 Other specified disorders of eye and adnexa: Secondary | ICD-10-CM | POA: Insufficient documentation

## 2017-06-21 DIAGNOSIS — I4891 Unspecified atrial fibrillation: Secondary | ICD-10-CM | POA: Insufficient documentation

## 2017-06-21 DIAGNOSIS — I10 Essential (primary) hypertension: Secondary | ICD-10-CM | POA: Insufficient documentation

## 2017-06-21 DIAGNOSIS — D649 Anemia, unspecified: Secondary | ICD-10-CM | POA: Insufficient documentation

## 2017-06-21 DIAGNOSIS — E039 Hypothyroidism, unspecified: Secondary | ICD-10-CM | POA: Insufficient documentation

## 2017-06-21 DIAGNOSIS — F17210 Nicotine dependence, cigarettes, uncomplicated: Secondary | ICD-10-CM | POA: Insufficient documentation

## 2017-06-21 DIAGNOSIS — Z79899 Other long term (current) drug therapy: Secondary | ICD-10-CM | POA: Insufficient documentation

## 2017-06-21 MED ORDER — ARTIFICIAL TEARS OPHTHALMIC OINT
TOPICAL_OINTMENT | Freq: Once | OPHTHALMIC | Status: AC
  Administered 2017-06-22: via OPHTHALMIC
  Filled 2017-06-21: qty 3.5

## 2017-06-21 MED ORDER — TETRACAINE HCL 0.5 % OP SOLN
1.0000 [drp] | Freq: Once | OPHTHALMIC | Status: AC
  Administered 2017-06-21: 1 [drp] via OPHTHALMIC
  Filled 2017-06-21: qty 4

## 2017-06-21 MED ORDER — FLUORESCEIN SODIUM 1 MG OP STRP
1.0000 | ORAL_STRIP | Freq: Once | OPHTHALMIC | Status: AC
  Administered 2017-06-21: 1 via OPHTHALMIC
  Filled 2017-06-21: qty 1

## 2017-06-21 NOTE — ED Triage Notes (Signed)
Patient is complaining of left eye redness, burning, watery, and eye drainage that has been occurring for 1.5 week. Patient reports she has a history of conjunctivitis.

## 2017-06-21 NOTE — ED Provider Notes (Signed)
[facility]-EMERGENCY DEPT Provider Note   CSN: [id number] Arrival date & time: 06/21/17  1902     History   Chief Complaint Chief Complaint  Patient presents with  . Conjunctivitis    HPI Lahoma RockerMarkysha M Ludlam is a 26 y.o. female.  HPI   Patient is a 26 year old female with a history of hypothyroidism, ITP, hypercholesterolemia, A. fib presenting for a week and a half of a foreign body sensation, itching, and watery drainage of the left eye.  Patient reports she has no history of trauma to this eye and has no incident where she felt something brush the eye.  Patient feels that her vision is very blurry in the left eye.  Patient reports she will wake up with watery drainage crusting around the left eye.  Patient denies any fever, chills, rashes.  Patient denies any headaches or retro-orbital pain or pressure.  Patient attempted antibiotic drops she had from a prior episode of conjunctivitis, without relief.  Patient has no history of immune suppressing conditions, but she does have a history of autoimmune conditions.  Patient is not a contact lens wearer.  Past Medical History:  Diagnosis Date  . Abortion May 2013  . Anemia   . Atrial fibrillation (HCC)   . Hypercholesteremia   . Hypertension   . Hyperthyroidism   . Iron deficiency anemia 09/04/2016  . Ovarian cyst     Patient Active Problem List   Diagnosis Date Noted  . Acute appendicitis 01/14/2017  . Iron deficiency anemia 09/04/2016  . Atrial flutter (HCC) 07/05/2014  . Hyperthyroidism 07/05/2014  . Thrombocytopenia (HCC) 05/27/2014  . Acute appendicitis, uncomplicated 05/26/2014  . Hypercholesteremia     History reviewed. No pertinent surgical history.  OB History    Gravida Para Term Preterm AB Living   1             SAB TAB Ectopic Multiple Live Births                   Home Medications    Prior to Admission medications   Medication Sig Start Date End Date Taking? Authorizing  Provider  amLODipine (NORVASC) 5 MG tablet Take 1 tablet (5 mg total) by mouth daily. 03/28/17   Antony MaduraHumes, Kelly, PA-C  Biotin [id number] MCG TABS Take 1,000 mcg by mouth daily.     [provider]  cetirizine (ZYRTEC) 10 MG tablet Take 10 mg by mouth daily as needed for allergies.     [provider]  hydrochlorothiazide (HYDRODIURIL) 25 MG tablet Take 25 mg by mouth daily.     [provider]  IRON PO Take 1 tablet by mouth daily.     [provider]  methimazole (TAPAZOLE) 10 MG tablet Take 1 tablet (10 mg total) by mouth 2 (two) times daily. 03/28/17   Antony MaduraHumes, Kelly, PA-C  metoprolol tartrate (LOPRESSOR) 25 MG tablet Take 0.5 tablets (12.5 mg total) by mouth every morning. 05/21/17   Romero BellingEllison, Sean, MD    Family History Family History  Problem Relation Age of Onset  . Diabetes Other        Parent  . Cancer Other        Breast Cancer-Parent  . Hypertension Other        Parent  . Hyperlipidemia Other        Parent  . Arthritis Other        Parent    Social History Social History   Tobacco Use  .  Smoking status: Current Every Day Smoker    Packs/day: 0.25    Types: Cigarettes  . Smokeless tobacco: Never Used  Substance Use Topics  . Alcohol use: Yes    Alcohol/week: 0.0 oz    Comment: 2-3 times a month   . Drug use: No     Allergies   Benadryl [diphenhydramine hcl] and Chloroxine   Review of Systems Review of Systems  Constitutional: Negative for chills and fever.  HENT: Negative for congestion, rhinorrhea, sinus pressure and sinus pain.   Eyes: Positive for photophobia, discharge, redness, itching and visual disturbance.  Gastrointestinal: Negative for nausea and vomiting.     Physical Exam Updated Vital Signs BP 137/89 (BP Location: Left Arm)   Pulse 87   Temp 98.9 F (37.2 C) (Oral)   Resp 18   Wt 104.8 kg (231 lb)   LMP 06/17/2017   SpO2 100%   BMI 34.11 kg/m   Physical Exam  Constitutional: She appears well-developed and  well-nourished. No distress.  Sitting comfortably in bed.  HENT:  Head: Normocephalic and atraumatic.  Eyes: Conjunctivae and EOM are normal. Pupils are equal, round, and reactive to light. Right eye exhibits no discharge. Left eye exhibits no discharge.  Left eye exam: No proptosis.  No periorbital swelling.  No pain with extraocular movements.  Minimal injection of the sclera of the left eye.  No limbic flush.  No active drainage.  Slit-lamp examination demonstrates no obvious cell or flare.  Possible punctate lesions noted over cornea overlying pupil on Woods lamp examination.  Neck: Normal range of motion.  Cardiovascular: Normal rate and regular rhythm.  Intact, 2+ radial pulse.  Pulmonary/Chest:  Normal respiratory effort. Patient converses comfortably. No audible wheeze or stridor.  Abdominal: She exhibits no distension.  Musculoskeletal: Normal range of motion.  Neurological: She is alert.  Cranial nerves intact to gross observation. Patient moves extremities without difficulty.  Skin: Skin is warm and dry. She is not diaphoretic.  Psychiatric: She has a normal mood and affect. Her behavior is normal. Judgment and thought content normal.  Nursing note and vitals reviewed.     Visual Acuity  Right Eye Distance: 20/70 Left Eye Distance: 20/100 Bilateral Distance: 20/50  Right Eye Near:   Left Eye Near:    Bilateral Near:     ED Treatments / Results  Labs (all labs ordered are listed, but only abnormal results are displayed) Labs Reviewed - No data to display  EKG  EKG Interpretation None       Radiology No results found.  Procedures Procedures (including critical care time)  Medications Ordered in ED Medications  artificial tears (LACRILUBE) ophthalmic ointment (not administered)  tetracaine (PONTOCAINE) 0.5 % ophthalmic solution 1 drop (1 drop Both Eyes Given 06/21/17 2233)  fluorescein ophthalmic strip 1 strip (1 strip Both Eyes Given 06/21/17 2233)      Initial Impression / Assessment and Plan / ED Course  I have reviewed the triage vital signs and the nursing notes.  Pertinent labs & imaging results that were available during my care of the patient were reviewed by me and considered in my medical decision making (see chart for details).     Final Clinical Impressions(s) / ED Diagnoses   Final diagnoses:  Irritation of left eye   Patient is well-appearing, afebrile, and in no acute distress.  Due to concerning visual acuity, and possible punctate lesions noted on slit-lamp examination overlying cornea, will proceed to ophthalmology consult.  There appears to be  no evidence of iritis, scleritis, herpes keratitis. There are possible punctate uptakes on the cornea overlying the pupil concerning for corneal abrasion. No evidence of ulcer.  11:54 PM spoke with ophthalmologist on-call, Dr. Sherryll Burger, who feels patient can follow-up tomorrow AM, and does not need to be started on topical antibiotics at this time per history and exam. Suggested artificial tear treatment for tonight.  This is a shared visit with Dr. Pricilla Loveless. Patient was independently evaluated by this attending physician. Attending physician consulted in evaluation and discharge management.   ED Discharge Orders    None       Delia Chimes 06/21/17 2356    Pricilla Loveless, MD 06/24/17 [phone]

## 2017-06-22 NOTE — Discharge Instructions (Signed)
Please see the information and instructions below regarding your visit.  Your diagnoses today include:  1. Irritation of left eye    Your exam is suggested that there may be some irritation of the left eye, and some possible abrasions to it.  We would like you to follow-up with an ophthalmologist tomorrow morning, Dr. Sherryll BurgerShah.  His phone number and address of office are listed in this paperwork.  Tests performed today include: See side panel of your discharge paperwork for testing performed today. Vital signs are listed at the bottom of these instructions.   Medications prescribed:    Take any prescribed medications only as prescribed, and any over the counter medications only as directed on the packaging.  You are given some artificial tear ointment they may apply tonight.  Home care instructions:  Please follow any educational materials contained in this packet.   Follow-up instructions: Please follow up with Dr. Sherryll BurgerShah tomorrow morning.  Return instructions:  Please return to the Emergency Department if you experience worsening symptoms.  Please return to the emergency department for any increasing pain particularly behind the eye, fever with your symptoms.  It is very important that he be evaluated by Dr. Sherryll BurgerShah tomorrow. Please return if you have any other emergent concerns.  Additional Information:   Your vital signs today were: BP 137/89 (BP Location: Left Arm)    Pulse 87    Temp 98.9 F (37.2 C) (Oral)    Resp 18    Wt 104.8 kg (231 lb)    LMP 06/17/2017    SpO2 100%    BMI 34.11 kg/m  If your blood pressure (BP) was elevated on multiple readings during this visit above 130 for the top number or above 80 for the bottom number, please have this repeated by your primary care provider within one month. --------------  Thank you for allowing us to participate in your care today.

## 2017-06-22 NOTE — ED Notes (Signed)
Pt ambulatory and independent at discharge.  Verbalized understanding of discharge instructions 

## 2017-07-07 ENCOUNTER — Telehealth: Payer: Self-pay

## 2017-07-07 ENCOUNTER — Other Ambulatory Visit: Payer: Self-pay

## 2017-07-07 MED ORDER — METOPROLOL TARTRATE 25 MG PO TABS: 12.5000 mg | ORAL_TABLET | ORAL | 5 refills | Status: DC

## 2017-07-07 MED ORDER — HYDROCHLOROTHIAZIDE 25 MG PO TABS: 25.0000 mg | ORAL_TABLET | Freq: Every day | ORAL | 1 refills | Status: DC

## 2017-07-07 NOTE — Telephone Encounter (Signed)
Done

## 2017-07-07 NOTE — Telephone Encounter (Signed)
Patient called and needs a refill on hydrochlorothiazide and metoprolol tartrate sent into her [facility] pharmacy

## 2017-07-12 ENCOUNTER — Telehealth: Payer: Self-pay | Admitting: Internal Medicine

## 2017-07-12 ENCOUNTER — Encounter: Payer: Self-pay | Admitting: Internal Medicine

## 2017-07-12 ENCOUNTER — Ambulatory Visit (HOSPITAL_BASED_OUTPATIENT_CLINIC_OR_DEPARTMENT_OTHER): Payer: Self-pay | Admitting: Internal Medicine

## 2017-07-12 ENCOUNTER — Other Ambulatory Visit: Payer: Self-pay | Admitting: Medical Oncology

## 2017-07-12 ENCOUNTER — Ambulatory Visit (HOSPITAL_BASED_OUTPATIENT_CLINIC_OR_DEPARTMENT_OTHER): Payer: Self-pay

## 2017-07-12 VITALS — BP 133/75 | HR 97 | Temp 98.8°F | Resp 20 | Ht 69.0 in | Wt 230.1 lb

## 2017-07-12 DIAGNOSIS — D696 Thrombocytopenia, unspecified: Secondary | ICD-10-CM

## 2017-07-12 DIAGNOSIS — D693 Immune thrombocytopenic purpura: Secondary | ICD-10-CM

## 2017-07-12 DIAGNOSIS — D508 Other iron deficiency anemias: Secondary | ICD-10-CM

## 2017-07-12 DIAGNOSIS — N920 Excessive and frequent menstruation with regular cycle: Secondary | ICD-10-CM

## 2017-07-12 DIAGNOSIS — E0590 Thyrotoxicosis, unspecified without thyrotoxic crisis or storm: Secondary | ICD-10-CM

## 2017-07-12 DIAGNOSIS — R233 Spontaneous ecchymoses: Secondary | ICD-10-CM

## 2017-07-12 LAB — COMPREHENSIVE METABOLIC PANEL
ALBUMIN: 3.9 g/dL (ref 3.5–5.0)
ALK PHOS: 70 U/L (ref 40–150)
ALT: 19 U/L (ref 0–55)
AST: 11 U/L (ref 5–34)
Anion Gap: 10 mEq/L (ref 3–11)
BILIRUBIN TOTAL: 0.34 mg/dL (ref 0.20–1.20)
BUN: 12 mg/dL (ref 7.0–26.0)
CALCIUM: 9.4 mg/dL (ref 8.4–10.4)
CO2: 24 mEq/L (ref 22–29)
CREATININE: 0.7 mg/dL (ref 0.6–1.1)
Chloride: 105 mEq/L (ref 98–109)
EGFR: >60 mL/min/{1.73_m2} (ref >60)
GLUCOSE: 133 mg/dL (ref 70–140)
Potassium: 3.1 mEq/L — ABNORMAL LOW (ref 3.5–5.1)
SODIUM: 139 meq/L (ref 136–145)
TOTAL PROTEIN: 8.4 g/dL — AB (ref 6.4–8.3)

## 2017-07-12 LAB — CBC WITH DIFFERENTIAL/PLATELET
BASO%: 0.1 % (ref 0.0–2.0)
Basophils Absolute: 0 10*3/uL (ref 0.0–0.1)
EOS%: 2.2 % (ref 0.0–7.0)
Eosinophils Absolute: 0.1 10*3/uL (ref 0.0–0.5)
HEMATOCRIT: 37.6 % (ref 34.8–46.6)
HGB: 12.2 g/dL (ref 11.6–15.9)
LYMPH%: 35.4 % (ref 14.0–49.7)
MCH: 26 pg (ref 25.1–34.0)
MCHC: 32.4 g/dL (ref 31.5–36.0)
MCV: 80.1 fL (ref 79.5–101.0)
MONO#: 0.6 10*3/uL (ref 0.1–0.9)
MONO%: 11 % (ref 0.0–14.0)
NEUT%: 51.3 % (ref 38.4–76.8)
NEUTROS ABS: 2.8 10*3/uL (ref 1.5–6.5)
Platelets: 9 10*3/uL — CL (ref 145–400)
RBC: 4.69 10*6/uL (ref 3.70–5.45)
RDW: 15.3 % — ABNORMAL HIGH (ref 11.2–14.5)
WBC: 5.5 10*3/uL (ref 3.9–10.3)
lymph#: 1.9 10*3/uL (ref 0.9–3.3)
nRBC: 0 % (ref 0–0)

## 2017-07-12 MED ORDER — PREDNISONE 20 MG PO TABS: ORAL_TABLET | ORAL | 0 refills | Status: DC

## 2017-07-12 MED ORDER — POTASSIUM CHLORIDE CRYS ER 20 MEQ PO TBCR: 20.0000 meq | EXTENDED_RELEASE_TABLET | Freq: Every day | ORAL | 0 refills | Status: DC

## 2017-07-12 MED ORDER — OMEPRAZOLE 20 MG PO CPDR: 20.0000 mg | DELAYED_RELEASE_CAPSULE | Freq: Two times a day (BID) | ORAL | 1 refills | Status: DC

## 2017-07-12 NOTE — Progress Notes (Signed)
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  CURRENT THERAPY: Observation.  INTERVAL HISTORY: Kellie Shaffer 26 y.o. female returns to the clinic today for follow-up visit.  The patient was last seen in January 2018.  She was diagnosed with hyperthyroidism and she was under the care of Dr. Everardo AllEllison.  She is currently on methimazole 10 mg p.o. daily.  She has been tolerating this treatment well but over the last few weeks she started noticing petechiae on her skin.  She also had heavy menstrual recently.  She denied having any other bleeding or ecchymosis.  She has no chest pain, shortness breath, cough or hemoptysis.  She denied having any nausea, vomiting, diarrhea or constipation.  She is here today for reevaluation and recommendation regarding her condition.  MEDICAL HISTORY: Past Medical History:  Diagnosis Date  . Abortion May 2013  . Anemia   . Atrial fibrillation (HCC)   . Hypercholesteremia   . Hypertension   . Hyperthyroidism   . Iron deficiency anemia 09/04/2016  . Ovarian cyst     ALLERGIES:  is allergic to benadryl [diphenhydramine hcl] and chloroxine.  MEDICATIONS:  Current Outpatient Medications  Medication Sig Dispense Refill  . amLODipine (NORVASC) 5 MG tablet Take 1 tablet (5 mg total) by mouth daily. 30 tablet 1  . Biotin 9811910000 MCG TABS Take 1,000 mcg by mouth daily.     . cetirizine (ZYRTEC) 10 MG tablet Take 10 mg by mouth daily as needed for allergies.     . hydrochlorothiazide (HYDRODIURIL) 25 MG tablet Take 1 tablet (25 mg total) by mouth daily. 30 tablet 1  . IRON PO Take 1 tablet by mouth daily.     . methimazole (TAPAZOLE) 10 MG tablet Take 1 tablet (10 mg total) by mouth 2 (two) times daily. 60 tablet 5    . metoprolol tartrate (LOPRESSOR) 25 MG tablet Take 0.5 tablets (12.5 mg total) by mouth every morning. 15 tablet 5   No current facility-administered medications for this visit.     SURGICAL HISTORY:  Past Surgical History:  Procedure Laterality Date  . LAPAROSCOPIC APPENDECTOMY N/A 01/14/2017   Procedure: APPENDECTOMY LAPAROSCOPIC;  Surgeon: Claud KelpIngram, Haywood, MD;  Location: [facility];  Service: General;  Laterality: N/A;    REVIEW OF SYSTEMS:  Constitutional: positive for fatigue Eyes: positive for Exophthalmus Ears, nose, mouth, throat, and face: negative Respiratory: negative Cardiovascular: negative Gastrointestinal: negative Genitourinary:negative Integument/breast: negative Hematologic/lymphatic: positive for petechiae Musculoskeletal:negative Neurological: negative Behavioral/Psych: negative Endocrine: negative Allergic/Immunologic: negative   PHYSICAL EXAMINATION: General appearance: alert, cooperative, appears stated age and fatigued Head: Normocephalic, without obvious abnormality, atraumatic, Exophthalmus of the eyes Neck: no adenopathy, no JVD, supple, symmetrical, trachea midline and thyroid not enlarged, symmetric, no tenderness/mass/nodules Lymph nodes: Cervical, supraclavicular, and axillary nodes normal. Resp: clear to auscultation bilaterally Back: symmetric, no curvature. ROM normal. No CVA tenderness. Cardio: regular rate and rhythm, S1, S2 normal, no murmur, click, rub or gallop GI: soft, non-tender; bowel sounds normal; no masses,  no organomegaly Extremities: extremities normal, atraumatic, no cyanosis or edema Neurologic: Alert and oriented X 3, normal strength and tone. Normal symmetric reflexes. Normal coordination and gait  ECOG PERFORMANCE STATUS: 1 - Symptomatic but completely ambulatory  Blood pressure 133/75, pulse 97, temperature 98.8  F (37.1 C), temperature source Oral, resp. rate 20, height 5\' 9"  (1.753 m), weight 230 lb 1.6 oz (104.4 kg),  last menstrual period 06/17/2017, SpO2 100 %.  LABORATORY DATA: Lab Results  Component Value Date   WBC 5.5 07/12/2017   HGB 12.2 07/12/2017   HCT 37.6 07/12/2017   MCV 80.1 07/12/2017   PLT 9 Platelet count confirmed by slide estimate (LL) 07/12/2017      Chemistry      Component Value Date/Time   NA 139 07/12/2017 1549   K 3.1 (L) 07/12/2017 1549   CL 102 03/28/2017 0019   CO2 24 07/12/2017 1549   BUN 12.0 07/12/2017 1549   CREATININE 0.7 07/12/2017 1549      Component Value Date/Time   CALCIUM 10.0 03/28/2017 0019   CALCIUM 9.2 07/05/2014 1135   ALKPHOS 61 01/14/2017 0028   ALKPHOS 76 07/05/2014 1135   AST 13 (L) 01/14/2017 0028   AST 13 07/05/2014 1135   ALT 20 01/14/2017 0028   ALT 19 07/05/2014 1135   BILITOT 0.4 01/14/2017 0028   BILITOT 0.40 07/05/2014 1135       RADIOGRAPHIC STUDIES: No results found.  ASSESSMENT AND PLAN: This is a very pleasant 26 years old African-American female with: 1) idiopathic thrombocytopenic purpura: The patient has been doing fine for several months.  She started becoming more symptomatic recently with petechiae and heavy menstrual..  Repeat CBC today showed a platelet count of 9000. I had a lengthy discussion with the patient today about her condition.  I recommended for her to start treatment with prednisone 1 mg/KG on daily basis.  Her total dose will be 100 mg p.o. daily.  I will taper her prednisone dose slowly over the next few weeks. I will schedule the patient for follow-up appointment next week with repeat CBC, comprehensive metabolic panel and LDH. I also recommended for the patient bleeding precaution and to be careful with any contact or trauma.  She was also advised to go immediately to the emergency department if she has any bleeding issues. The patient was also advised to take over-the-counter Prilosec 1-2 tablet p.o. daily during her treatment with prednisone for GI prophylaxis.  2) hyperthyroidism: She is currently  on treatment with methimazole.  I recommended for the patient to hold her treatment for now because of the thrombocytopenia adverse effect of this treatment.  She will resume it again once her platelet count improved.  The patient was advised to call immediately if she has any concerning symptoms in the interval. The patient voices understanding of current disease status and treatment options and is in agreement with the current care plan.  All questions were answered. The patient knows to call the clinic with any problems, questions or concerns. We can certainly see the patient much sooner if necessary.  Disclaimer: This note was dictated with voice recognition software. Similar sounding words can inadvertently be transcribed and may not be corrected upon review.

## 2017-07-12 NOTE — Telephone Encounter (Signed)
Gave avs and calendar for December  °

## 2017-07-19 ENCOUNTER — Ambulatory Visit (HOSPITAL_BASED_OUTPATIENT_CLINIC_OR_DEPARTMENT_OTHER): Payer: Self-pay | Admitting: Oncology

## 2017-07-19 ENCOUNTER — Encounter: Payer: Self-pay | Admitting: Oncology

## 2017-07-19 ENCOUNTER — Telehealth: Payer: Self-pay | Admitting: Oncology

## 2017-07-19 ENCOUNTER — Other Ambulatory Visit (HOSPITAL_BASED_OUTPATIENT_CLINIC_OR_DEPARTMENT_OTHER): Payer: Self-pay

## 2017-07-19 VITALS — BP 134/78 | HR 78 | Temp 98.2°F | Resp 20 | Ht 69.0 in | Wt 229.1 lb

## 2017-07-19 DIAGNOSIS — D696 Thrombocytopenia, unspecified: Secondary | ICD-10-CM

## 2017-07-19 DIAGNOSIS — E0590 Thyrotoxicosis, unspecified without thyrotoxic crisis or storm: Secondary | ICD-10-CM

## 2017-07-19 DIAGNOSIS — E876 Hypokalemia: Secondary | ICD-10-CM | POA: Insufficient documentation

## 2017-07-19 DIAGNOSIS — D693 Immune thrombocytopenic purpura: Secondary | ICD-10-CM

## 2017-07-19 LAB — CBC WITH DIFFERENTIAL/PLATELET
BASO%: 0.2 % (ref 0.0–2.0)
Basophils Absolute: 0 10*3/uL (ref 0.0–0.1)
EOS%: 0.7 % (ref 0.0–7.0)
Eosinophils Absolute: 0.2 10*3/uL (ref 0.0–0.5)
HEMATOCRIT: 34.9 % (ref 34.8–46.6)
HEMOGLOBIN: 11.2 g/dL — AB (ref 11.6–15.9)
LYMPH#: 6.5 10*3/uL — AB (ref 0.9–3.3)
LYMPH%: 30.9 % (ref 14.0–49.7)
MCH: 26 pg (ref 25.1–34.0)
MCHC: 32.1 g/dL (ref 31.5–36.0)
MCV: 80.9 fL (ref 79.5–101.0)
MONO#: 1.4 10*3/uL — AB (ref 0.1–0.9)
MONO%: 6.5 % (ref 0.0–14.0)
NEUT#: 12.9 10*3/uL — ABNORMAL HIGH (ref 1.5–6.5)
NEUT%: 61.7 % (ref 38.4–76.8)
PLATELETS: 81 10*3/uL — AB (ref 145–400)
RBC: 4.32 10*6/uL (ref 3.70–5.45)
RDW: 15.1 % — ABNORMAL HIGH (ref 11.2–14.5)
WBC: 20.9 10*3/uL — AB (ref 3.9–10.3)
nRBC: 0 % (ref 0–0)

## 2017-07-19 LAB — COMPREHENSIVE METABOLIC PANEL
ALBUMIN: 4.2 g/dL (ref 3.5–5.0)
ALK PHOS: 64 U/L (ref 40–150)
ALT: 29 U/L (ref 0–55)
AST: 11 U/L (ref 5–34)
Anion Gap: 12 mEq/L — ABNORMAL HIGH (ref 3–11)
BUN: 18.6 mg/dL (ref 7.0–26.0)
CHLORIDE: 102 meq/L (ref 98–109)
CO2: 23 mEq/L (ref 22–29)
Calcium: 9.9 mg/dL (ref 8.4–10.4)
Creatinine: 0.9 mg/dL (ref 0.6–1.1)
GLUCOSE: 135 mg/dL (ref 70–140)
POTASSIUM: 2.9 meq/L — AB (ref 3.5–5.1)
SODIUM: 138 meq/L (ref 136–145)
Total Bilirubin: 0.39 mg/dL (ref 0.20–1.20)
Total Protein: 8.4 g/dL — ABNORMAL HIGH (ref 6.4–8.3)

## 2017-07-19 LAB — LACTATE DEHYDROGENASE: LDH: 195 U/L (ref 125–245)

## 2017-07-19 MED ORDER — POTASSIUM CHLORIDE CRYS ER 20 MEQ PO TBCR: 20.0000 meq | EXTENDED_RELEASE_TABLET | Freq: Two times a day (BID) | ORAL | 0 refills | Status: DC

## 2017-07-19 MED ORDER — PREDNISONE 20 MG PO TABS: ORAL_TABLET | ORAL | 0 refills | Status: DC

## 2017-07-19 NOTE — Progress Notes (Signed)
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  CURRENT THERAPY: Prednisone 100 mg daily started on 07/12/2017.  INTERVAL HISTORY: Kellie Shaffer 26 y.o. female returns for routine follow-up visit by herself.  The patient has been taking her prednisone as ordered reports that she feels more irritable.  She has not noticed any petechiae or any other bleeding.  She had a heavy menstrual period which has stopped now.  She denies any easy bruisability.  Denies fevers and chills.  Denies chest pain, shortness breath, cough, hemoptysis.  Denies nausea, vomiting, constipation, diarrhea.  The patient is here for reevaluation and repeat blood work.  MEDICAL HISTORY: Past Medical History:  Diagnosis Date  . Abortion May 2013  . Anemia   . Atrial fibrillation (HCC)   . Hypercholesteremia   . Hypertension   . Hyperthyroidism   . Iron deficiency anemia 09/04/2016  . Ovarian cyst     ALLERGIES:  is allergic to benadryl [diphenhydramine hcl] and chloroxine.  MEDICATIONS:  Current Outpatient Medications  Medication Sig Dispense Refill  . amLODipine (NORVASC) 5 MG tablet Take 1 tablet (5 mg total) by mouth daily. 30 tablet 1  . Biotin 16109 MCG TABS Take 1,000 mcg by mouth daily.     . cetirizine (ZYRTEC) 10 MG tablet Take 10 mg by mouth daily as needed for allergies.     . hydrochlorothiazide (HYDRODIURIL) 25 MG tablet Take 1 tablet (25 mg total) by mouth daily. 30 tablet 1  . IRON PO Take 1 tablet by mouth daily.     . methimazole (TAPAZOLE) 10 MG tablet Take 1 tablet (10 mg total) by mouth 2 (two) times daily. 60 tablet 5  . metoprolol tartrate (LOPRESSOR) 25 MG tablet Take 0.5 tablets (12.5 mg total) by mouth every morning. 15 tablet 5  . omeprazole (PRILOSEC) 20 MG capsule Take 1 capsule (20 mg  total) by mouth 2 (two) times daily before a meal. 60 capsule 1  . potassium chloride SA (K-DUR,KLOR-CON) 20 MEQ tablet Take 1 tablet (20 mEq total) by mouth 2 (two) times daily. 14 tablet 0  . predniSONE (DELTASONE) 20 MG tablet 5 tablets p.o. daily 70 tablet 0  . predniSONE (DELTASONE) 20 MG tablet Take 4 tabs daily for 1 week, then 3 tabs daily for 1 week, then 2 tabs daily for 1 week, then 1 tab daily for 1 week, then 1/2 tab daily for 1 week. 75 tablet 0   No current facility-administered medications for this visit.     SURGICAL HISTORY:  Past Surgical History:  Procedure Laterality Date  . LAPAROSCOPIC APPENDECTOMY N/A 01/14/2017   Procedure: APPENDECTOMY LAPAROSCOPIC;  Surgeon: Claud Kelp, MD;  Location: [facility];  Service: General;  Laterality: N/A;    REVIEW OF SYSTEMS:   Review of Systems  Constitutional: Negative for appetite change, chills, fatigue, fever and unexpected weight change.  HENT:   Negative for mouth sores, nosebleeds, sore throat and trouble swallowing.   Eyes: Negative for eye problems and icterus.  Respiratory: Negative for cough, hemoptysis, shortness of breath and wheezing.   Cardiovascular: Negative for chest pain and leg swelling.  Gastrointestinal: Negative for abdominal pain, constipation, diarrhea, nausea and vomiting.  Genitourinary: Negative for bladder incontinence, difficulty urinating, dysuria, frequency and hematuria.   Musculoskeletal: Negative for back pain, gait problem, neck  pain and neck stiffness.  Skin: Negative for itching and rash.  Neurological: Negative for dizziness, extremity weakness, gait problem, headaches, light-headedness and seizures.  Hematological: Negative for adenopathy. Does not bruise/bleed easily.  Psychiatric/Behavioral: Negative for confusion, depression and sleep disturbance. The patient is not nervous/anxious.     PHYSICAL EXAMINATION:  Blood pressure 134/78, pulse 78, temperature 98.2 F (36.8 C), temperature  source Oral, resp. rate 20, height 5\' 9"  (1.753 m), weight 229 lb 1.6 oz (103.9 kg), SpO2 100 %.  Physical Exam  Constitutional: Oriented to person, place, and time and well-developed, well-nourished, and in no distress. No distress.  HENT:  Head: Normocephalic and atraumatic.  Mouth/Throat: Oropharynx is clear and moist. No oropharyngeal exudate.  Eyes: Conjunctivae are normal. Right eye exhibits no discharge. Left eye exhibits no discharge. No scleral icterus.  Neck: Normal range of motion. Neck supple.  Cardiovascular: Normal rate, regular rhythm, normal heart sounds and intact distal pulses.   Pulmonary/Chest: Effort normal and breath sounds normal. No respiratory distress. No wheezes. No rales.  Abdominal: Soft. Bowel sounds are normal. Exhibits no distension and no mass. There is no tenderness.  Musculoskeletal: Normal range of motion. Exhibits no edema.  Lymphadenopathy:    No cervical adenopathy.  Neurological: Alert and oriented to person, place, and time. Exhibits normal muscle tone. Gait normal. Coordination normal.  Skin: Skin is warm and dry. No rash noted. Not diaphoretic. No erythema. No pallor.  Psychiatric: Mood, memory and judgment normal.  Vitals reviewed.  LABORATORY DATA: Lab Results  Component Value Date   WBC 20.9 (H) 07/19/2017   HGB 11.2 (L) 07/19/2017   HCT 34.9 07/19/2017   MCV 80.9 07/19/2017   PLT 81 (L) 07/19/2017      Chemistry      Component Value Date/Time   NA 138 07/19/2017 1340   K 2.9 (LL) 07/19/2017 1340   CL 102 03/28/2017 0019   CO2 23 07/19/2017 1340   BUN 18.6 07/19/2017 1340   CREATININE 0.9 07/19/2017 1340      Component Value Date/Time   CALCIUM 9.9 07/19/2017 1340   ALKPHOS 64 07/19/2017 1340   AST 11 07/19/2017 1340   ALT 29 07/19/2017 1340   BILITOT 0.39 07/19/2017 1340       RADIOGRAPHIC STUDIES:  No results found.   ASSESSMENT/PLAN:   This is a very pleasant 26 year old African-American female with: 1)  idiopathic thrombocytopenic purpura: The patient has been doing fine for several months.  She started becoming more symptomatic recently with petechiae and heavy menstrual..  Repeat CBC today showed a platelet count of 81,000. The patient is currently on prednisone 1 mg/KG on daily basis for a total dose of 100 mg p.o. daily.   The patient was seen with Dr. Arbutus PedMohamed.  Labs are reviewed which show an improvement in her platelet count.  Recommend that she continue prednisone 100 mg daily for a total of 2 weeks.  She will then begin to taper the prednisone to 80 mg daily for 1 week then 60 mg daily for 1 week then 40 mg daily for 1 week then 20 mg daily for 1 week and then 10 mg daily for 1 week. The patient was also advised to take over-the-counter Prilosec 1-2 tablet p.o. daily during her treatment with prednisone for GI prophylaxis. The patient will return in 2-3 weeks to reevaluate her lab work.  She was instructed to call us if she develops any increased bruising or bleeding.  2) hyperthyroidism: Methimazole is  currently on hold.    The patient may resume her treatment in 1 week.  3) hypokalemia: The patient's potassium level is low at 2.9.  A prescription for K-Dur 20 mEq twice a day for 1 week has been sent to her pharmacy.  The patient was notified by telephone of this abnormal lab and need to take potassium twice a day.  The patient was advised to call immediately if she has any concerning symptoms in the interval. The patient voices understanding of current disease status and treatment options and is in agreement with the current care plan.  All questions were answered. The patient knows to call the clinic with any problems, questions or concerns. We can certainly see the patient much sooner if necessary.  Orders Placed This Encounter  Procedures  . CBC with Differential/Platelet    Standing Status:   Future    Standing Expiration Date:   07/19/2018  . Comprehensive metabolic panel     Standing Status:   Future    Standing Expiration Date:   07/19/2018   Clenton PareKristin Elishia Kaczorowski, DNP, AGPCNP-BC, AOCNP 07/19/17  ADDENDUM: Hematology/Oncology Attending: I had a face-to-face encounter with the patient.  I recommended his care plan.  This is a very pleasant 26 years old African-American female with idiopathic thrombocytopenic purpura as well as Graves' disease. The patient was seen last week and her platelets count was down to 9000. I started her on high-dose prednisone and she is currently on 1 mg/kg p.o. Daily. She has been tolerating this treatment well.  Repeat CBC today showed improvement of her platelets count to 81,000. I recommended for the patient to continue with a taper dose of prednisone over the next few weeks For the hypokalemia, we will start the patient on K Dur 40 mEq p.o. Daily For the history of hyperthyroidism, the patient will resume her treatment with methimazole next week. We will see the patient back for follow-up visit in 2 weeks for reevaluation and management of any adverse effect of her treatment. She was advised to call immediately if she has any concerning symptoms in the interval.  Disclaimer: This note was dictated with voice recognition software. Similar sounding words can inadvertently be transcribed and may be missed upon review. Lajuana MatteMohamed K Mohamed, MD 07/20/17 .

## 2017-07-19 NOTE — Patient Instructions (Signed)
Continue Prednisone 100 mg daily for 1 week then take 80 mg daily for 1 week, then 60 mg daily for 1 week, then 40 mg daily for 1 week, then 20 mg daily for 1 week, then 10 mg daily for 1 week.  You can restart your methimazole next week.  Follow-up in 2-3 weeks.

## 2017-07-19 NOTE — Telephone Encounter (Signed)
Scheduled appt per 12/3 los -patient is aware of appts - did not want a print out of AVS or calender -

## 2017-07-21 ENCOUNTER — Other Ambulatory Visit: Payer: Self-pay | Admitting: Oncology

## 2017-07-21 DIAGNOSIS — D696 Thrombocytopenia, unspecified: Secondary | ICD-10-CM

## 2017-08-03 ENCOUNTER — Other Ambulatory Visit: Payer: Self-pay

## 2017-08-03 ENCOUNTER — Ambulatory Visit: Payer: Self-pay | Admitting: Oncology

## 2017-08-04 ENCOUNTER — Telehealth: Payer: Self-pay | Admitting: Internal Medicine

## 2017-08-04 NOTE — Telephone Encounter (Signed)
Scheduled appt per 12/18 sch message - unable to reach patietn - left message with appt date and time and sent reminder letter in the mail with appt date and time.

## 2017-08-13 ENCOUNTER — Telehealth: Payer: Self-pay | Admitting: Oncology

## 2017-08-13 ENCOUNTER — Ambulatory Visit: Payer: Self-pay

## 2017-08-13 ENCOUNTER — Other Ambulatory Visit (HOSPITAL_BASED_OUTPATIENT_CLINIC_OR_DEPARTMENT_OTHER): Payer: Self-pay

## 2017-08-13 ENCOUNTER — Telehealth: Payer: Self-pay | Admitting: Emergency Medicine

## 2017-08-13 ENCOUNTER — Other Ambulatory Visit: Payer: Self-pay

## 2017-08-13 ENCOUNTER — Ambulatory Visit (HOSPITAL_BASED_OUTPATIENT_CLINIC_OR_DEPARTMENT_OTHER): Payer: Self-pay | Admitting: Oncology

## 2017-08-13 ENCOUNTER — Telehealth: Payer: Self-pay | Admitting: *Deleted

## 2017-08-13 ENCOUNTER — Encounter: Payer: Self-pay | Admitting: Oncology

## 2017-08-13 ENCOUNTER — Emergency Department (HOSPITAL_BASED_OUTPATIENT_CLINIC_OR_DEPARTMENT_OTHER)
Admission: EM | Admit: 2017-08-13 | Discharge: 2017-08-14 | Disposition: A | Discharge status: Home / Self Care | Payer: Self-pay | Attending: Emergency Medicine | Admitting: Emergency Medicine

## 2017-08-13 ENCOUNTER — Encounter (HOSPITAL_BASED_OUTPATIENT_CLINIC_OR_DEPARTMENT_OTHER): Payer: Self-pay | Admitting: Emergency Medicine

## 2017-08-13 ENCOUNTER — Encounter (HOSPITAL_COMMUNITY): Payer: Self-pay | Admitting: Emergency Medicine

## 2017-08-13 ENCOUNTER — Emergency Department (HOSPITAL_COMMUNITY)
Admission: EM | Admit: 2017-08-13 | Discharge: 2017-08-13 | Discharge status: Against Medical Advice | Payer: Self-pay | Attending: Emergency Medicine | Admitting: Emergency Medicine

## 2017-08-13 VITALS — BP 135/75 | HR 96 | Temp 98.6°F | Resp 20 | Ht 69.0 in | Wt 222.4 lb

## 2017-08-13 DIAGNOSIS — R739 Hyperglycemia, unspecified: Secondary | ICD-10-CM | POA: Insufficient documentation

## 2017-08-13 DIAGNOSIS — Z79899 Other long term (current) drug therapy: Secondary | ICD-10-CM | POA: Insufficient documentation

## 2017-08-13 DIAGNOSIS — B370 Candidal stomatitis: Secondary | ICD-10-CM

## 2017-08-13 DIAGNOSIS — R358 Other polyuria: Secondary | ICD-10-CM

## 2017-08-13 DIAGNOSIS — D693 Immune thrombocytopenic purpura: Secondary | ICD-10-CM

## 2017-08-13 DIAGNOSIS — F17210 Nicotine dependence, cigarettes, uncomplicated: Secondary | ICD-10-CM | POA: Insufficient documentation

## 2017-08-13 DIAGNOSIS — D696 Thrombocytopenia, unspecified: Secondary | ICD-10-CM

## 2017-08-13 DIAGNOSIS — R632 Polyphagia: Secondary | ICD-10-CM

## 2017-08-13 DIAGNOSIS — Z5321 Procedure and treatment not carried out due to patient leaving prior to being seen by health care provider: Secondary | ICD-10-CM | POA: Insufficient documentation

## 2017-08-13 DIAGNOSIS — I10 Essential (primary) hypertension: Secondary | ICD-10-CM | POA: Insufficient documentation

## 2017-08-13 DIAGNOSIS — H538 Other visual disturbances: Secondary | ICD-10-CM

## 2017-08-13 DIAGNOSIS — R631 Polydipsia: Secondary | ICD-10-CM

## 2017-08-13 DIAGNOSIS — E0590 Thyrotoxicosis, unspecified without thyrotoxic crisis or storm: Secondary | ICD-10-CM

## 2017-08-13 LAB — CBG MONITORING, ED
Glucose-Capillary: >600 mg/dL (ref 65–99)
Glucose-Capillary: 419 mg/dL — ABNORMAL HIGH (ref 65–99)
Glucose-Capillary: 358 mg/dL — ABNORMAL HIGH (ref 65–99)
Glucose-Capillary: 274 mg/dL — ABNORMAL HIGH (ref 65–99)

## 2017-08-13 LAB — CBC
HEMATOCRIT: 39.3 % (ref 36.0–46.0)
HEMOGLOBIN: 13.2 g/dL (ref 12.0–15.0)
MCH: 27.3 pg (ref 26.0–34.0)
MCHC: 33.6 g/dL (ref 30.0–36.0)
MCV: 81.4 fL (ref 78.0–100.0)
Platelets: 207 10*3/uL (ref 150–400)
RBC: 4.83 MIL/uL (ref 3.87–5.11)
RDW: 14.4 % (ref 11.5–15.5)
WBC: 10.2 10*3/uL (ref 4.0–10.5)

## 2017-08-13 LAB — I-STAT BETA HCG BLOOD, ED (MC, WL, AP ONLY)

## 2017-08-13 LAB — URINALYSIS, ROUTINE W REFLEX MICROSCOPIC
BACTERIA UA: NONE SEEN
BILIRUBIN URINE: NEGATIVE
Glucose, UA: >=500 mg/dL — AB
KETONES UR: NEGATIVE mg/dL
Leukocytes, UA: NEGATIVE
NITRITE: NEGATIVE
PROTEIN: NEGATIVE mg/dL
SPECIFIC GRAVITY, URINE: 1.024 (ref 1.005–1.030)
pH: 7 (ref 5.0–8.0)

## 2017-08-13 LAB — CBC WITH DIFFERENTIAL/PLATELET
BASO%: 0.2 % (ref 0.0–2.0)
BASOS ABS: 0 10*3/uL (ref 0.0–0.1)
EOS ABS: 0.1 10*3/uL (ref 0.0–0.5)
EOS%: 1.2 % (ref 0.0–7.0)
HCT: 37.2 % (ref 34.8–46.6)
HGB: 12.1 g/dL (ref 11.6–15.9)
LYMPH%: 33.3 % (ref 14.0–49.7)
MCH: 26.5 pg (ref 25.1–34.0)
MCHC: 32.5 g/dL (ref 31.5–36.0)
MCV: 81.6 fL (ref 79.5–101.0)
MONO#: 0.8 10*3/uL (ref 0.1–0.9)
MONO%: 9.2 % (ref 0.0–14.0)
NEUT#: 5.1 10*3/uL (ref 1.5–6.5)
NEUT%: 56.1 % (ref 38.4–76.8)
PLATELETS: 160 10*3/uL (ref 145–400)
RBC: 4.56 10*6/uL (ref 3.70–5.45)
RDW: 15 % — ABNORMAL HIGH (ref 11.2–14.5)
WBC: 9 10*3/uL (ref 3.9–10.3)
lymph#: 3 10*3/uL (ref 0.9–3.3)

## 2017-08-13 LAB — COMPREHENSIVE METABOLIC PANEL
ALBUMIN: 3.9 g/dL (ref 3.5–5.0)
ALT: 15 U/L (ref 0–55)
AST: 5 U/L (ref 5–34)
Alkaline Phosphatase: 100 U/L (ref 40–150)
Anion Gap: 12 mEq/L — ABNORMAL HIGH (ref 3–11)
BUN: 18.6 mg/dL (ref 7.0–26.0)
CHLORIDE: 95 meq/L — AB (ref 98–109)
CO2: 25 meq/L (ref 22–29)
Calcium: 9.9 mg/dL (ref 8.4–10.4)
Creatinine: 1.3 mg/dL — ABNORMAL HIGH (ref 0.6–1.1)
EGFR: >60 mL/min/{1.73_m2} (ref >60)
GLUCOSE: 672 mg/dL — AB (ref 70–140)
POTASSIUM: 4.3 meq/L (ref 3.5–5.1)
SODIUM: 132 meq/L — AB (ref 136–145)
Total Bilirubin: 0.52 mg/dL (ref 0.20–1.20)
Total Protein: 7.6 g/dL (ref 6.4–8.3)

## 2017-08-13 LAB — BASIC METABOLIC PANEL
ANION GAP: 11 (ref 5–15)
BUN: 21 mg/dL — ABNORMAL HIGH (ref 6–20)
CO2: 26 mmol/L (ref 22–32)
Calcium: 10.5 mg/dL — ABNORMAL HIGH (ref 8.9–10.3)
Chloride: 92 mmol/L — ABNORMAL LOW (ref 101–111)
Creatinine, Ser: 0.81 mg/dL (ref 0.44–1.00)
GLUCOSE: 551 mg/dL — AB (ref 65–99)
POTASSIUM: 4.1 mmol/L (ref 3.5–5.1)
Sodium: 129 mmol/L — ABNORMAL LOW (ref 135–145)

## 2017-08-13 LAB — WHOLE BLOOD GLUCOSE
Glucose: 517 mg/dL — ABNORMAL HIGH (ref 70–100)
HRS PC: 4 Hours

## 2017-08-13 LAB — LACTATE DEHYDROGENASE: LDH: 135 U/L (ref 125–245)

## 2017-08-13 MED ORDER — FLUCONAZOLE 100 MG PO TABS: 100.0000 mg | ORAL_TABLET | Freq: Every day | ORAL | 0 refills | Status: DC

## 2017-08-13 MED ORDER — SODIUM CHLORIDE 0.9 % IV BOLUS (SEPSIS)
1000.0000 mL | Freq: Once | INTRAVENOUS | Status: AC
  Administered 2017-08-13: 1000 mL via INTRAVENOUS

## 2017-08-13 MED ORDER — METFORMIN HCL 500 MG PO TABS: 500.0000 mg | ORAL_TABLET | Freq: Two times a day (BID) | ORAL | 0 refills | Status: DC

## 2017-08-13 MED ORDER — INSULIN REGULAR HUMAN 100 UNIT/ML IJ SOLN
15.0000 [IU] | Freq: Once | INTRAMUSCULAR | Status: AC
  Administered 2017-08-13: 15 [IU] via SUBCUTANEOUS
  Filled 2017-08-13: qty 0.15

## 2017-08-13 NOTE — Telephone Encounter (Signed)
"  What should I do?  Sent to ED an hour ago for help to get my blood sugar down.  .  A call was to be made to ED so I would not have to wait long.  I feel tired, light headed, blurred vision.  Do I just sit tight or what?  My mom and dad both have diabetes.  Never had problem with blood sugar." Viewed CMET results, report was called.  advised to remain in ED for further evaluation with today's glucose results.  If symptoms progress advance quickly to registration desk for a nurse.  Denies any further questions or needs at this time.

## 2017-08-13 NOTE — Progress Notes (Signed)
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  CURRENT THERAPY: Prednisone 100 mg daily started on 07/12/2017.  INTERVAL HISTORY: Kellie Shaffer 26 y.o. female returns for routine follow-up visit accompanied by her friend.  The patient remains on prednisone for her ITP.  She reports increased fatigue, weakness, increased appetite and thirst, blurred vision, and difficulty swallowing for the past 5 days.  Reports increased frequency of urination.  She is currently on prednisone 40 mg daily.  She denies fevers and chills.  Denies chest pain, shortness of breath, cough, hemoptysis.  Denies nausea, vomiting, constipation, diarrhea.  She denies bleeding.  The patient is here for repeat blood work.  MEDICAL HISTORY: Past Medical History:  Diagnosis Date  . Abortion May 2013  . Anemia   . Atrial fibrillation (HCC)   . Hypercholesteremia   . Hypertension   . Hyperthyroidism   . Iron deficiency anemia 09/04/2016  . Ovarian cyst     ALLERGIES:  is allergic to benadryl [diphenhydramine hcl] and chloroxine.  MEDICATIONS:  Current Outpatient Medications  Medication Sig Dispense Refill  . amLODipine (NORVASC) 5 MG tablet Take 1 tablet (5 mg total) by mouth daily. 30 tablet 1  . Biotin [id number] MCG TABS Take 1,000 mcg by mouth daily.     . cetirizine (ZYRTEC) 10 MG tablet Take 10 mg by mouth daily as needed for allergies.     . hydrochlorothiazide (HYDRODIURIL) 25 MG tablet Take 1 tablet (25 mg total) by mouth daily. 30 tablet 1  . IRON PO Take 1 tablet by mouth daily.     . methimazole (TAPAZOLE) 10 MG tablet Take 1 tablet (10 mg total) by mouth 2 (two) times daily. 60 tablet 5  . metoprolol tartrate (LOPRESSOR) 25 MG tablet Take 0.5 tablets (12.5 mg total) by mouth every morning. 15 tablet 5  .  omeprazole (PRILOSEC) 20 MG capsule Take 1 capsule (20 mg total) by mouth 2 (two) times daily before a meal. 60 capsule 1  . predniSONE (DELTASONE) 20 MG tablet Take 4 tabs daily for 1 week, then 3 tabs daily for 1 week, then 2 tabs daily for 1 week, then 1 tab daily for 1 week, then 1/2 tab daily for 1 week. 75 tablet 0  . fluconazole (DIFLUCAN) 100 MG tablet Take 1 tablet (100 mg total) by mouth daily. 7 tablet 0  . potassium chloride SA (K-DUR,KLOR-CON) 20 MEQ tablet Take 1 tablet (20 mEq total) by mouth 2 (two) times daily. 14 tablet 0   No current facility-administered medications for this visit.     SURGICAL HISTORY:  Past Surgical History:  Procedure Laterality Date  . LAPAROSCOPIC APPENDECTOMY N/A 01/14/2017   Procedure: APPENDECTOMY LAPAROSCOPIC;  Surgeon: Claud KelpIngram, Haywood, MD;  Location: [facility];  Service: General;  Laterality: N/A;    REVIEW OF SYSTEMS:   Review of Systems  Constitutional: Negative for chills, fever.  Positive for fatigue, weakness, increased appetite, and weight loss. HENT:   Negative for mouth sores, nosebleeds, sore throat.  Positive for difficulty swallowing.   Eyes: Negative for eye problems and icterus. Positive for blurred vision. Respiratory: Negative for cough, hemoptysis, shortness of breath and wheezing.   Cardiovascular: Negative for chest pain and leg swelling.  Gastrointestinal: Negative for abdominal pain, constipation, diarrhea, nausea and vomiting.  Genitourinary: Negative for bladder incontinence,  difficulty urinating, dysuria, and hematuria.  Positive for polyuria.  Musculoskeletal: Negative for back pain, gait problem, neck pain and neck stiffness.  Skin: Negative for itching and rash.  Neurological: Negative for dizziness, extremity weakness, gait problem, headaches, light-headedness and seizures.  Hematological: Negative for adenopathy. Does not bruise/bleed easily.  Psychiatric/Behavioral: Negative for confusion, depression and sleep  disturbance. The patient is not nervous/anxious.     PHYSICAL EXAMINATION:  Blood pressure 135/75, pulse 96, temperature 98.6 F (37 C), temperature source Oral, resp. rate 20, height 5\' 9"  (1.753 m), weight 222 lb 6.4 oz (100.9 kg), SpO2 99 %.  Physical Exam  Constitutional: Oriented to person, place, and time and well-developed, well-nourished, and in no distress. No distress.  HENT:  Head: Normocephalic and atraumatic.  Mouth/Throat: Posterior pharynx positive for thrush.  Eyes: Conjunctivae are normal. Right eye exhibits no discharge. Left eye exhibits no discharge. No scleral icterus.  Neck: Normal range of motion. Neck supple.  Cardiovascular: Normal rate, regular rhythm, normal heart sounds and intact distal pulses.   Pulmonary/Chest: Effort normal and breath sounds normal. No respiratory distress. No wheezes. No rales.  Abdominal: Soft. Bowel sounds are normal. Exhibits no distension and no mass. There is no tenderness.  Musculoskeletal: Normal range of motion. Exhibits no edema.  Lymphadenopathy:    No cervical adenopathy.  Neurological: Alert and oriented to person, place, and time. Exhibits normal muscle tone. Gait normal. Coordination normal.  Skin: Skin is warm and dry. No rash noted. Not diaphoretic. No erythema. No pallor.  Psychiatric: Mood, memory and judgment normal.  Vitals reviewed.  LABORATORY DATA: Lab Results  Component Value Date   WBC 9.0 08/13/2017   HGB 12.1 08/13/2017   HCT 37.2 08/13/2017   MCV 81.6 08/13/2017   PLT 160 08/13/2017      Chemistry      Component Value Date/Time   NA 132 (L) 08/13/2017 1044   K 4.3 08/13/2017 1044   CL 102 03/28/2017 0019   CO2 25 08/13/2017 1044   BUN 18.6 08/13/2017 1044   CREATININE 1.3 (H) 08/13/2017 1044   GLU 517 (H) 08/13/2017 1257      Component Value Date/Time   CALCIUM 9.9 08/13/2017 1044   ALKPHOS 100 08/13/2017 1044   AST 5 08/13/2017 1044   ALT 15 08/13/2017 1044   BILITOT 0.52 08/13/2017 1044        RADIOGRAPHIC STUDIES:  No results found.   ASSESSMENT/PLAN:   This is a very pleasant [age]year old African-American female with: 1) idiopathic thrombocytopenic purpura:The patient has been doing fine for several months. She started becoming more symptomatic recently with petechiae and heavy menstrual.. Repeat CBC today showed a platelet count of 160,000. The patient is currently on prednisone 40 mg daily.  The patient was seen with Dr. Arbutus PedMohamed.  Labs are reviewed which show an improvement in her platelet count.  Recommend that she reduce her prednisone to 20 mg daily starting tomorrow times 4 days and then take 10 mg daily for 4 days and then stop. The patient was also advised to take over-the-counter Prilosec 1-2 tablet p.o. daily during her treatment with prednisone for GI prophylaxis. The patient will return in `1-2 weeks to reevaluate her lab work.  She was instructed to call us if she develops any increased bruising or bleeding.  2) hyperthyroidism:Currently on methimazole.  Followed by endocrinology.  3) oral candidiasis: A prescription for fluconazole 100 mg daily times 7 days was sent to her pharmacy.  4) hyperglycemia: The patient  does not have a diagnosis of diabetes.  Hyperglycemia likely related to prednisone which will be tapered.  Glucose is 672 in our office today.  She has symptoms including polyuria, polyphagia, polydipsia, and blurred vision.  Labs reviewed with Dr. Arbutus Ped.  15 units of regular insulin was administered in our office.  Call placed to endocrinology who can see her on January 22 for their first available appointment.  Repeat glucose was 517 after 15 units of regular insulin.  The patient was feeling more lethargic.  Advised patient to go to the emergency room for evaluation and treatment of her hyperglycemia.  The patient is in agreement to this plan.  The patient was advised to call immediately if she has any concerning symptoms in the  interval. The patient voices understanding of current disease status and treatment options and is in agreement with the current care plan.  All questions were answered. The patient knows to call the clinic with any problems, questions or concerns. We can certainly see the patient much sooner if necessary.  Orders Placed This Encounter  Procedures  . Glucose, whole blood (CBG)    Standing Status:   Future    Number of Occurrences:   1    Standing Expiration Date:   08/13/2018  . CBC with Differential/Platelet    Standing Status:   Future    Standing Expiration Date:   08/13/2018  . Comprehensive metabolic panel    Standing Status:   Future    Standing Expiration Date:   08/13/2018    Clenton Pare, DNP, AGPCNP-BC, AOCNP 08/13/17  ADDENDUM: Hematology/Oncology Attending: I had a face-to-face encounter with the patient.  I recommended her care plan.  This is a very pleasant 26 years old African-American female with history of immune mediated thrombocytopenic purpura.  The patient is currently on a taper dose of prednisone 40 mg p.o. daily.  She has been tolerating this treatment well except for agitation and increased appetite.  She came to the clinic today with blood sugar of 672.  Her platelet count has significantly improved.  I recommended for the patient to taper her prednisone at a faster rate.  She will be on 20 mg p.o. daily for 4 days followed by 10 mg p.o. daily for 4 days then discontinue.  Regarding the hyperglycemia, we gave the patient 15 units of regular insulin in the clinic and her blood sugar was still elevated at 517. It was recommended for the patient to go to the emergency department for further evaluation of her condition and management of her hyperglycemia. We will see the patient back for follow-up visit in 2 weeks for reevaluation and repeat blood work. For the oral thrush, we started the patient on Diflucan. The patient was advised to call immediately if she has  any concerning symptoms in the interval.  Disclaimer: This note was dictated with voice recognition software. Similar sounding words can inadvertently be transcribed and may be missed upon review. Lajuana Matte, MD 08/15/17

## 2017-08-13 NOTE — Patient Instructions (Signed)
Take Prednisone 20 mg daily X 4 days starting on Saturday 12/29 and then take Prednisone 10 mg daily X 4 days and then stop.  A prescription for fluconzaole 100 mg daily was sent to your pharmacy. Take this until gone.   If fatigue and weakness worsen or you develop nausea, vomiting, or confusion, go to the ER for evaluation.

## 2017-08-13 NOTE — Telephone Encounter (Signed)
Glucose rechecked after given 15U of insulin. Pt recheck is 517, NP made aware. NP recommend patient go to ED for eval. [facility].

## 2017-08-13 NOTE — ED Provider Notes (Signed)
[facility] EMERGENCY DEPARTMENT Provider Note   CSN: [id number] Arrival date & time: 08/13/17  1849     History   Chief Complaint Chief Complaint  Patient presents with  . Hyperglycemia    HPI Lahoma RockerMarkysha M Bertholf is a 26 y.o. female.  HPI   Reports increased thirst, increased urination, increased appetite, fatigue and blurred vision. Blurred vision looking at things far away, new today.  Other symptoms have been present for 5 days.  Have been on steroids for about 4-5 weeks. Began treatment 11/26.  Also has had candidal infection of throat, has not been able to fill rx yet.  No numbness, weakness, nausea nor vomiting. Labs done today at office and at [facility] so came to ED here.   Past Medical History:  Diagnosis Date  . Abortion May 2013  . Anemia   . Atrial fibrillation (HCC)   . Hypercholesteremia   . Hypertension   . Hyperthyroidism   . Iron deficiency anemia 09/04/2016  . Ovarian cyst     Patient Active Problem List   Diagnosis Date Noted  . Hyperglycemia 08/13/2017  . Oral candidiasis 08/13/2017  . Hypokalemia 07/19/2017  . Acute appendicitis 01/14/2017  . Iron deficiency anemia 09/04/2016  . Atrial flutter (HCC) 07/05/2014  . Hyperthyroidism 07/05/2014  . Thrombocytopenia (HCC) 05/27/2014  . Acute appendicitis, uncomplicated 05/26/2014  . Hypercholesteremia     Past Surgical History:  Procedure Laterality Date  . LAPAROSCOPIC APPENDECTOMY N/A 01/14/2017   Procedure: APPENDECTOMY LAPAROSCOPIC;  Surgeon: Claud KelpIngram, Haywood, MD;  Location: [facility];  Service: General;  Laterality: N/A;    OB History    Gravida Para Term Preterm AB Living   1             SAB TAB Ectopic Multiple Live Births                   Home Medications    Prior to Admission medications   Medication Sig Start Date End Date Taking? Authorizing Provider  amLODipine (NORVASC) 5 MG tablet Take 1 tablet (5 mg total) by mouth daily. 03/28/17   Antony MaduraHumes,  Kelly, PA-C  Biotin [id number] MCG TABS Take 1,000 mcg by mouth daily.     [provider]  cetirizine (ZYRTEC) 10 MG tablet Take 10 mg by mouth daily as needed for allergies.     [provider]  fluconazole (DIFLUCAN) 100 MG tablet Take 1 tablet (100 mg total) by mouth daily. 08/13/17   Myrtis Serurcio, Kristin R, NP  hydrochlorothiazide (HYDRODIURIL) 25 MG tablet Take 1 tablet (25 mg total) by mouth daily. 07/07/17   Romero BellingEllison, Sean, MD  IRON PO Take 1 tablet by mouth daily.     [provider]  metFORMIN (GLUCOPHAGE) 500 MG tablet Take 1 tablet (500 mg total) by mouth 2 (two) times daily with a meal. 08/13/17   Alvira MondaySchlossman, Jamareon Shimel, MD  methimazole (TAPAZOLE) 10 MG tablet Take 1 tablet (10 mg total) by mouth 2 (two) times daily. 03/28/17   Antony MaduraHumes, Kelly, PA-C  metoprolol tartrate (LOPRESSOR) 25 MG tablet Take 0.5 tablets (12.5 mg total) by mouth every morning. 07/07/17   Romero BellingEllison, Sean, MD  omeprazole (PRILOSEC) 20 MG capsule Take 1 capsule (20 mg total) by mouth 2 (two) times daily before a meal. 07/12/17   Si GaulMohamed, Mohamed, MD  potassium chloride SA (K-DUR,KLOR-CON) 20 MEQ tablet Take 1 tablet (20 mEq total) by mouth 2 (two) times daily. 07/19/17   Myrtis Serurcio, Kristin R,  NP  predniSONE (DELTASONE) 20 MG tablet Take 4 tabs daily for 1 week, then 3 tabs daily for 1 week, then 2 tabs daily for 1 week, then 1 tab daily for 1 week, then 1/2 tab daily for 1 week. 07/19/17   Myrtis Ser, NP    Family History Family History  Problem Relation Age of Onset  . Diabetes Other        Parent  . Cancer Other        Breast Cancer-Parent  . Hypertension Other        Parent  . Hyperlipidemia Other        Parent  . Arthritis Other        Parent    Social History Social History   Tobacco Use  . Smoking status: Current Every Day Smoker    Packs/day: 0.25    Types: Cigarettes  . Smokeless tobacco: Never Used  Substance Use Topics  . Alcohol use: Yes    Alcohol/week: 0.0 oz    Comment: 2-3  times a month   . Drug use: No     Allergies   Benadryl [diphenhydramine hcl] and Chloroxine   Review of Systems Review of Systems  Constitutional: Negative for fever.  HENT: Negative for sore throat.   Eyes: Positive for visual disturbance.  Respiratory: Negative for cough and shortness of breath.   Cardiovascular: Negative for chest pain.  Gastrointestinal: Negative for abdominal pain, nausea and vomiting.  Endocrine: Positive for polydipsia, polyphagia and polyuria.  Genitourinary: Negative for difficulty urinating.  Musculoskeletal: Negative for back pain and neck pain.  Skin: Negative for rash.  Neurological: Negative for syncope and headaches.     Physical Exam Updated Vital Signs BP 120/75 (BP Location: Left Arm)   Pulse 72   Temp 98.2 F (36.8 C) (Oral)   Resp 16   Ht 5\' 9"  (1.753 m)   Wt 100.7 kg (222 lb)   LMP 08/13/2017   SpO2 99%   BMI 32.78 kg/m   Physical Exam  Constitutional: She is oriented to person, place, and time. She appears well-developed and well-nourished. No distress.  HENT:  Head: Normocephalic and atraumatic.  Eyes: Conjunctivae and EOM are normal.  Slight bilateral proptosis, pt states eyes at baseline.  Neck: Normal range of motion.  Cardiovascular: Normal rate, regular rhythm, normal heart sounds and intact distal pulses. Exam reveals no gallop and no friction rub.  No murmur heard. Pulmonary/Chest: Effort normal and breath sounds normal. No respiratory distress. She has no wheezes. She has no rales.  Abdominal: Soft. She exhibits no distension. There is no tenderness. There is no guarding.  Musculoskeletal: She exhibits no edema or tenderness.  Neurological: She is alert and oriented to person, place, and time. She has normal strength. No cranial nerve deficit or sensory deficit. GCS eye subscore is 4. GCS verbal subscore is 5. GCS motor subscore is 6.  Skin: Skin is warm and dry. No rash noted. She is not diaphoretic. No erythema.    Nursing note and vitals reviewed.    ED Treatments / Results  Labs (all labs ordered are listed, but only abnormal results are displayed) Labs Reviewed  CBG MONITORING, ED - Abnormal; Notable for the following components:      Result Value   Glucose-Capillary 419 (*)    All other components within normal limits  CBG MONITORING, ED - Abnormal; Notable for the following components:   Glucose-Capillary 358 (*)    All other components within normal  limits  CBG MONITORING, ED - Abnormal; Notable for the following components:   Glucose-Capillary 274 (*)    All other components within normal limits    EKG  EKG Interpretation None       Radiology No results found.  Procedures Procedures (including critical care time)  Medications Ordered in ED Medications  sodium chloride 0.9 % bolus 1,000 mL (0 mLs Intravenous Stopped 08/13/17 2230)  sodium chloride 0.9 % bolus 1,000 mL (0 mLs Intravenous Stopped 08/13/17 2047)     Initial Impression / Assessment and Plan / ED Course  I have reviewed the triage vital signs and the nursing notes.  Pertinent labs & imaging results that were available during my care of the patient were reviewed by me and considered in my medical decision making (see chart for details).     26 year old female with a history of hyperthyroidism, hypertension, hyperlipidemia, ITP on prednisone, presents with concern for hyperglycemia.  Patient has been on steroids since November 26, was found today to have significant hyperglycemia with glucose above 600.  She was sent to the emergency department for further care.  She is given insulin in the office earlier.  L[facility] today in the office [facility] while she was in the emergency care, show no sign of DKA or other complications.  Patient with significant hyperglycemia.  She was given 2 L of normal saline with improvement of glucose down to 274.  Reports her visual changes have improved following  improvement of her glucose.  She has no other neurologic abnormalities on exam.  Given patient's duration of steroid treatment, we are unable to stop the steroid abruptly, and will continue the taper as directed by Dr. Shirline FreesMohammed.  Given she will remain on steroids with significant hyperglycemia, will treat with metformin.  Recommend continued follow-up closely with primary care physician, adjustment of diet, and further discussion with Dr. Shirline FreesMohammed as well as scheduled follow-up with endocrinology. Patient discharged in stable condition with understanding of reasons to return.   Final Clinical Impressions(s) / ED Diagnoses   Final diagnoses:  Hyperglycemia    ED Discharge Orders        Ordered    metFORMIN (GLUCOPHAGE) 500 MG tablet  2 times daily with meals     08/13/17 2355       Alvira MondaySchlossman, Lether Tesch, MD 08/14/17 0159

## 2017-08-13 NOTE — ED Triage Notes (Signed)
[facility]

## 2017-08-13 NOTE — Telephone Encounter (Signed)
Scheduled appt per 12/28 los - Patient sister took message - gave her the appt date and time . Said she would notify her sister.

## 2017-08-13 NOTE — ED Notes (Signed)
Pt called for room x2, no response from lobby 

## 2017-08-13 NOTE — ED Triage Notes (Signed)
Patient reports that she saw her hematologist today and was sent here for further evaluation. Patient has been on prednisone for about 5 weeks and beginning this week began having hyperglycemia. No history of it in past. Pt was given 15 units insulin PTA. Patient c/o blurred vision, urinary frequency, excess thirst and lethargy. Pt reports having some lab work done PTA as well.

## 2017-08-13 NOTE — ED Triage Notes (Signed)
Patient states that she was seen by DR today [facility] for Elevated Blood Sugar. The patient recently was on steroids  - patient states that she has 8 more days of steroids

## 2017-08-17 DIAGNOSIS — R87619 Unspecified abnormal cytological findings in specimens from cervix uteri: Secondary | ICD-10-CM

## 2017-08-17 HISTORY — DX: Unspecified abnormal cytological findings in specimens from cervix uteri: R87.619

## 2017-08-19 ENCOUNTER — Telehealth: Payer: Self-pay | Admitting: *Deleted

## 2017-08-19 NOTE — Telephone Encounter (Signed)
FYI "Last Friday I went to ED for help to get my blood sugar down.  It's been in the 200's until today.  Blood sugar equal 552 now.  This morning blod sugar equal 477.  Should I return to the ED?  I feel very tired.  I was urinating a lot yesterday but not today.  Not thirsty, no N/V, s.o.b., pain or increased heart rate.  Started metformin twice a day.  First available appointment with endocrinologist Dr. Everardo AllEllison is September 07, 2017.  Never had a problem.  The steroid is why my blood sugar is high.         Instructed to report to ED.    "I'm at work.  Will be leaving soon.  I can check blood sugar again in an hour.  If it's still high, I'll go to ED."    Reviewed danger of blood sugar being too low or too high.    "I checked blood sugar after eating.  No one told me what level blood sugar should be, how to check it or what to eat.  ED staff said to drink lots of water and rest.  I work at [employer] walking quite a bit at work.  I'm not eating sweets.  Today I've eaten a bacon egg and cheese biscuit this morning, salad with chicken, croutons with Ceasar dressing."  Asked that she go to ED as blood sugar may not drop below 180.  Reviewed signs of hyper/hypo glycemic symptoms, examples of CHO's, low, high glycemic foods, watch portions and the importance of exercise.  Endocrinologist will guide and decide what her blood sugar should be.  A reference to use is blood sugar = 70 to 130 before meals and not rise above 180's to 200's after meal.   Routing call information to collaborative nurse and provider for review.  Further patient communication through collaborative nurse.

## 2017-08-23 ENCOUNTER — Encounter: Payer: Self-pay | Admitting: Internal Medicine

## 2017-08-23 ENCOUNTER — Inpatient Hospital Stay: Payer: Self-pay

## 2017-08-23 ENCOUNTER — Inpatient Hospital Stay: Payer: Self-pay | Attending: Internal Medicine | Admitting: Internal Medicine

## 2017-08-23 ENCOUNTER — Telehealth: Payer: Self-pay | Admitting: Internal Medicine

## 2017-08-23 VITALS — BP 131/79 | HR 84 | Temp 98.3°F | Resp 18 | Ht 69.0 in | Wt 211.9 lb

## 2017-08-23 DIAGNOSIS — D693 Immune thrombocytopenic purpura: Secondary | ICD-10-CM | POA: Insufficient documentation

## 2017-08-23 DIAGNOSIS — T380X5A Adverse effect of glucocorticoids and synthetic analogues, initial encounter: Secondary | ICD-10-CM | POA: Insufficient documentation

## 2017-08-23 DIAGNOSIS — E876 Hypokalemia: Secondary | ICD-10-CM | POA: Insufficient documentation

## 2017-08-23 DIAGNOSIS — R739 Hyperglycemia, unspecified: Secondary | ICD-10-CM | POA: Insufficient documentation

## 2017-08-23 DIAGNOSIS — E0590 Thyrotoxicosis, unspecified without thyrotoxic crisis or storm: Secondary | ICD-10-CM | POA: Insufficient documentation

## 2017-08-23 DIAGNOSIS — D696 Thrombocytopenia, unspecified: Secondary | ICD-10-CM

## 2017-08-23 DIAGNOSIS — D0590 Unspecified type of carcinoma in situ of unspecified breast: Secondary | ICD-10-CM

## 2017-08-23 DIAGNOSIS — I10 Essential (primary) hypertension: Secondary | ICD-10-CM | POA: Insufficient documentation

## 2017-08-23 LAB — COMPREHENSIVE METABOLIC PANEL
ALBUMIN: 4.3 g/dL (ref 3.5–5.0)
ALK PHOS: 90 U/L (ref 40–150)
ALT: 33 U/L (ref 0–55)
AST: 15 U/L (ref 5–34)
Anion gap: 17 — ABNORMAL HIGH (ref 3–11)
BILIRUBIN TOTAL: 0.4 mg/dL (ref 0.2–1.2)
BUN: 11 mg/dL (ref 7–26)
CALCIUM: 10.7 mg/dL — AB (ref 8.4–10.4)
CO2: 22 mmol/L (ref 22–29)
Chloride: 93 mmol/L — ABNORMAL LOW (ref 98–109)
Creatinine, Ser: 1.01 mg/dL (ref 0.60–1.10)
GFR calc Af Amer: >60 mL/min (ref >60)
GFR calc non Af Amer: >60 mL/min (ref >60)
GLUCOSE: 370 mg/dL — AB (ref 70–140)
POTASSIUM: 2.6 mmol/L — AB (ref 3.3–4.7)
SODIUM: 132 mmol/L — AB (ref 136–145)
TOTAL PROTEIN: 8.3 g/dL (ref 6.4–8.3)

## 2017-08-23 LAB — CBC WITH DIFFERENTIAL/PLATELET
ABS GRANULOCYTE: 5.1 10*3/uL (ref 1.5–6.5)
BASOS PCT: 0 %
Basophils Absolute: 0 10*3/uL (ref 0.0–0.1)
Eosinophils Absolute: 0.1 10*3/uL (ref 0.0–0.5)
Eosinophils Relative: 1 %
HEMATOCRIT: 38.5 % (ref 34.8–46.6)
Hemoglobin: 13.3 g/dL (ref 11.6–15.9)
LYMPHS ABS: 3.1 10*3/uL (ref 0.9–3.3)
Lymphocytes Relative: 35 %
MCH: 26.9 pg (ref 25.1–34.0)
MCHC: 34.5 g/dL (ref 31.5–36.0)
MCV: 77.8 fL — ABNORMAL LOW (ref 79.5–101.0)
MONO ABS: 0.8 10*3/uL (ref 0.1–0.9)
Monocytes Relative: 8 %
Neutro Abs: 5.1 10*3/uL (ref 1.5–6.5)
Neutrophils Relative %: 56 %
Platelets: 219 10*3/uL (ref 145–400)
RBC: 4.95 MIL/uL (ref 3.70–5.45)
RDW: 14 % (ref 11.2–16.1)
WBC: 9.1 10*3/uL (ref 3.9–10.3)

## 2017-08-23 MED ORDER — POTASSIUM CHLORIDE CRYS ER 20 MEQ PO TBCR: 20.0000 meq | EXTENDED_RELEASE_TABLET | Freq: Two times a day (BID) | ORAL | 0 refills | Status: DC

## 2017-08-23 NOTE — Telephone Encounter (Signed)
Scheduled appt per 1/7 los - Gave pt AVS and calender per los. - Lab and f/u in 3 months.

## 2017-08-23 NOTE — Progress Notes (Signed)
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  CURRENT THERAPY: Observation.  INTERVAL HISTORY: Kellie Shaffer 27 y.o. female returns to the clinic today for follow-up visit accompanied by a family member.  The patient is feeling fine today with no specific complaints.  She was seen recently at the emergency department with hyperglycemia secondary to steroids treatment.  She is currently off steroids.  She denied having any chest pain, shortness breath, cough or hemoptysis.  She denied having any weight loss or night sweats.  She has no nausea, vomiting, diarrhea or constipation.  She has no bleeding issues.  The patient had repeat CBC and comprehensive metabolic panel performed earlier today and she is here for evaluation and discussion of her lab results.   MEDICAL HISTORY: Past Medical History:  Diagnosis Date  . Abortion May 2013  . Anemia   . Atrial fibrillation (HCC)   . Hypercholesteremia   . Hypertension   . Hyperthyroidism   . Iron deficiency anemia 09/04/2016  . Ovarian cyst     ALLERGIES:  is allergic to benadryl [diphenhydramine hcl] and chloroxine.  MEDICATIONS:  Current Outpatient Medications  Medication Sig Dispense Refill  . amLODipine (NORVASC) 5 MG tablet Take 1 tablet (5 mg total) by mouth daily. 30 tablet 1  . Biotin [id number] MCG TABS Take 1,000 mcg by mouth daily.     . cetirizine (ZYRTEC) 10 MG tablet Take 10 mg by mouth daily as needed for allergies.     . fluconazole (DIFLUCAN) 100 MG tablet Take 1 tablet (100 mg total) by mouth daily. 7 tablet 0  . hydrochlorothiazide (HYDRODIURIL) 25 MG tablet Take 1 tablet (25 mg total) by mouth daily. 30 tablet 1  . IRON PO Take 1 tablet by mouth daily.     . metFORMIN  (GLUCOPHAGE) 500 MG tablet Take 1 tablet (500 mg total) by mouth 2 (two) times daily with a meal. 30 tablet 0  . methimazole (TAPAZOLE) 10 MG tablet Take 1 tablet (10 mg total) by mouth 2 (two) times daily. 60 tablet 5  . metoprolol tartrate (LOPRESSOR) 25 MG tablet Take 0.5 tablets (12.5 mg total) by mouth every morning. 15 tablet 5  . omeprazole (PRILOSEC) 20 MG capsule Take 1 capsule (20 mg total) by mouth 2 (two) times daily before a meal. 60 capsule 1  . potassium chloride SA (K-DUR,KLOR-CON) 20 MEQ tablet Take 1 tablet (20 mEq total) by mouth 2 (two) times daily. 14 tablet 0  . predniSONE (DELTASONE) 20 MG tablet Take 4 tabs daily for 1 week, then 3 tabs daily for 1 week, then 2 tabs daily for 1 week, then 1 tab daily for 1 week, then 1/2 tab daily for 1 week. 75 tablet 0   No current facility-administered medications for this visit.     SURGICAL HISTORY:  Past Surgical History:  Procedure Laterality Date  . LAPAROSCOPIC APPENDECTOMY N/A 01/14/2017   Procedure: APPENDECTOMY LAPAROSCOPIC;  Surgeon: Claud KelpIngram, Haywood, MD;  Location: [facility];  Service: General;  Laterality: N/A;    REVIEW OF SYSTEMS:  Constitutional: positive for fatigue Eyes: negative Ears, nose, mouth, throat, and face: negative Respiratory: negative Cardiovascular: negative Gastrointestinal: negative Genitourinary:negative Integument/breast: negative Hematologic/lymphatic: negative Musculoskeletal:negative Neurological: negative Behavioral/Psych: negative Endocrine: negative Allergic/Immunologic: negative  PHYSICAL EXAMINATION: General appearance: alert, cooperative, appears stated age and no distress Head: Normocephalic, without obvious abnormality, atraumatic Neck: no adenopathy, no JVD, supple, symmetrical, trachea midline and thyroid not enlarged, symmetric, no tenderness/mass/nodules Lymph nodes: Cervical, supraclavicular, and axillary nodes normal. Resp: clear to auscultation bilaterally Back: symmetric,  no curvature. ROM normal. No CVA tenderness. Cardio: regular rate and rhythm, S1, S2 normal, no murmur, click, rub or gallop GI: soft, non-tender; bowel sounds normal; no masses,  no organomegaly Extremities: extremities normal, atraumatic, no cyanosis or edema Neurologic: Alert and oriented X 3, normal strength and tone. Normal symmetric reflexes. Normal coordination and gait  ECOG PERFORMANCE STATUS: 1 - Symptomatic but completely ambulatory  Blood pressure 131/79, pulse 84, temperature 98.3 F (36.8 C), temperature source Oral, resp. rate 18, height 5\' 9"  (1.753 m), weight 211 lb 14.4 oz (96.1 kg), last menstrual period 08/13/2017, SpO2 100 %.  LABORATORY DATA: Lab Results  Component Value Date   WBC 9.1 08/23/2017   HGB 13.3 08/23/2017   HCT 38.5 08/23/2017   MCV 77.8 (L) 08/23/2017   PLT 219 08/23/2017      Chemistry      Component Value Date/Time   NA 132 (L) 08/23/2017 1043   NA 132 (L) 08/13/2017 1044   K 2.6 (LL) 08/23/2017 1043   K 4.3 08/13/2017 1044   CL 93 (L) 08/23/2017 1043   CO2 22 08/23/2017 1043   CO2 25 08/13/2017 1044   BUN 11 08/23/2017 1043   BUN 18.6 08/13/2017 1044   CREATININE 1.01 08/23/2017 1043   CREATININE 1.3 (H) 08/13/2017 1044   GLU 517 (H) 08/13/2017 1257      Component Value Date/Time   CALCIUM 10.5 (H) 08/13/2017 1608   CALCIUM 9.9 08/13/2017 1044   ALKPHOS 100 08/13/2017 1044   AST 5 08/13/2017 1044   ALT 15 08/13/2017 1044   BILITOT 0.52 08/13/2017 1044       RADIOGRAPHIC STUDIES: No results found.  ASSESSMENT AND PLAN: This is a very pleasant 27 years old African-American female with: 1) idiopathic thrombocytopenic purpura: The patient was started recently on a taper dose of prednisone which was completed.  Her platelets count has significantly improved.  Her platelets count today are 219,000. I recommended for the patient to continue on observation with repeat CBC, comprehensive metabolic panel and LDH in 3 months.  2)  hyperthyroidism: She resumed her treatment with methimazole.  She will continue her routine follow-up visit by her endocrinologist.  3) steroid-induced hyperglycemia: Continues to improve after she discontinued the treatment with prednisone.  We will continue to monitor for now.  She will also see her endocrinologist for evaluation.  4) hypokalemia: I will start the patient on K Dur 40 mEq p.o. daily.  She was also advised to increase her potassium intake in diet. The patient was advised to call immediately if she has any concerning symptoms in the interval.  The patient voices understanding of current disease status and treatment options and is in agreement with the current care plan.  All questions were answered. The patient knows to call the clinic with any problems, questions or concerns. We can certainly see the patient much sooner if necessary.  Disclaimer: This note was dictated with voice recognition software. Similar sounding words can inadvertently be transcribed and may not be corrected upon review.

## 2017-08-27 ENCOUNTER — Other Ambulatory Visit: Payer: Self-pay

## 2017-08-27 ENCOUNTER — Encounter (HOSPITAL_BASED_OUTPATIENT_CLINIC_OR_DEPARTMENT_OTHER): Payer: Self-pay | Admitting: Emergency Medicine

## 2017-08-27 ENCOUNTER — Inpatient Hospital Stay (HOSPITAL_BASED_OUTPATIENT_CLINIC_OR_DEPARTMENT_OTHER)
Admission: EM | Admit: 2017-08-27 | Discharge: 2017-08-31 | DRG: 638 | Disposition: A | Discharge status: Home / Self Care | Payer: Self-pay | Attending: Family Medicine | Admitting: Family Medicine

## 2017-08-27 DIAGNOSIS — D693 Immune thrombocytopenic purpura: Secondary | ICD-10-CM | POA: Diagnosis present

## 2017-08-27 DIAGNOSIS — E0590 Thyrotoxicosis, unspecified without thyrotoxic crisis or storm: Secondary | ICD-10-CM | POA: Diagnosis present

## 2017-08-27 DIAGNOSIS — I4891 Unspecified atrial fibrillation: Secondary | ICD-10-CM | POA: Diagnosis present

## 2017-08-27 DIAGNOSIS — I10 Essential (primary) hypertension: Secondary | ICD-10-CM | POA: Diagnosis present

## 2017-08-27 DIAGNOSIS — F17210 Nicotine dependence, cigarettes, uncomplicated: Secondary | ICD-10-CM | POA: Diagnosis present

## 2017-08-27 DIAGNOSIS — E876 Hypokalemia: Secondary | ICD-10-CM | POA: Diagnosis present

## 2017-08-27 DIAGNOSIS — E1110 Type 2 diabetes mellitus with ketoacidosis without coma: Principal | ICD-10-CM | POA: Diagnosis present

## 2017-08-27 DIAGNOSIS — Z79899 Other long term (current) drug therapy: Secondary | ICD-10-CM

## 2017-08-27 DIAGNOSIS — L239 Allergic contact dermatitis, unspecified cause: Secondary | ICD-10-CM | POA: Diagnosis present

## 2017-08-27 DIAGNOSIS — Z888 Allergy status to other drugs, medicaments and biological substances status: Secondary | ICD-10-CM

## 2017-08-27 DIAGNOSIS — K0889 Other specified disorders of teeth and supporting structures: Secondary | ICD-10-CM | POA: Diagnosis present

## 2017-08-27 DIAGNOSIS — E0910 Drug or chemical induced diabetes mellitus with ketoacidosis without coma: Secondary | ICD-10-CM

## 2017-08-27 DIAGNOSIS — E7800 Pure hypercholesterolemia, unspecified: Secondary | ICD-10-CM | POA: Diagnosis present

## 2017-08-27 LAB — CBG MONITORING, ED
GLUCOSE-CAPILLARY: 312 mg/dL — AB (ref 65–99)
Glucose-Capillary: >600 mg/dL (ref 65–99)
Glucose-Capillary: 404 mg/dL — ABNORMAL HIGH (ref 65–99)
Glucose-Capillary: 359 mg/dL — ABNORMAL HIGH (ref 65–99)

## 2017-08-27 LAB — PREGNANCY, URINE: Preg Test, Ur: NEGATIVE

## 2017-08-27 LAB — BASIC METABOLIC PANEL
ANION GAP: 15 (ref 5–15)
BUN: 13 mg/dL (ref 6–20)
CHLORIDE: 104 mmol/L (ref 101–111)
CO2: 15 mmol/L — AB (ref 22–32)
Calcium: 10.1 mg/dL (ref 8.9–10.3)
Creatinine, Ser: 0.84 mg/dL (ref 0.44–1.00)
GFR calc non Af Amer: >60 mL/min (ref >60)
Glucose, Bld: 351 mg/dL — ABNORMAL HIGH (ref 65–99)
POTASSIUM: 2.6 mmol/L — AB (ref 3.5–5.1)
Sodium: 134 mmol/L — ABNORMAL LOW (ref 135–145)

## 2017-08-27 LAB — CBC WITH DIFFERENTIAL/PLATELET
BASOS ABS: 0 10*3/uL (ref 0.0–0.1)
Basophils Relative: 0 %
Eosinophils Absolute: 0.1 10*3/uL (ref 0.0–0.7)
Eosinophils Relative: 1 %
HEMATOCRIT: 39.3 % (ref 36.0–46.0)
Hemoglobin: 13.5 g/dL (ref 12.0–15.0)
LYMPHS ABS: 1.8 10*3/uL (ref 0.7–4.0)
LYMPHS PCT: 21 %
MCH: 26.6 pg (ref 26.0–34.0)
MCHC: 34.4 g/dL (ref 30.0–36.0)
MCV: 77.5 fL — ABNORMAL LOW (ref 78.0–100.0)
MONOS PCT: 8 %
Monocytes Absolute: 0.7 10*3/uL (ref 0.1–1.0)
NEUTROS PCT: 70 %
Neutro Abs: 5.9 10*3/uL (ref 1.7–7.7)
Platelets: 330 10*3/uL (ref 150–400)
RBC: 5.07 MIL/uL (ref 3.87–5.11)
RDW: 14.5 % (ref 11.5–15.5)
WBC: 8.5 10*3/uL (ref 4.0–10.5)

## 2017-08-27 LAB — URINALYSIS, ROUTINE W REFLEX MICROSCOPIC
Bilirubin Urine: NEGATIVE
Glucose, UA: >=500 mg/dL — AB
Hgb urine dipstick: NEGATIVE
LEUKOCYTES UA: NEGATIVE
NITRITE: NEGATIVE
PH: 5.5 (ref 5.0–8.0)
PROTEIN: NEGATIVE mg/dL
Specific Gravity, Urine: 1.01 (ref 1.005–1.030)

## 2017-08-27 LAB — I-STAT VENOUS BLOOD GAS, ED
ACID-BASE DEFICIT: 12 mmol/L — AB (ref 0.0–2.0)
Bicarbonate: 12.2 mmol/L — ABNORMAL LOW (ref 20.0–28.0)
O2 Saturation: 95 %
PH VEN: 7.304 (ref 7.250–7.430)
PO2 VEN: 81 mmHg — AB (ref 32.0–45.0)
TCO2: 13 mmol/L — ABNORMAL LOW (ref 22–32)
pCO2, Ven: 24.5 mmHg — ABNORMAL LOW (ref 44.0–60.0)

## 2017-08-27 LAB — URINALYSIS, MICROSCOPIC (REFLEX)
RBC / HPF: NONE SEEN RBC/hpf (ref 0–5)
WBC UA: NONE SEEN WBC/hpf (ref 0–5)

## 2017-08-27 LAB — COMPREHENSIVE METABOLIC PANEL
ALT: 30 U/L (ref 14–54)
ANION GAP: 24 — AB (ref 5–15)
AST: 14 U/L — ABNORMAL LOW (ref 15–41)
Albumin: 4.4 g/dL (ref 3.5–5.0)
Alkaline Phosphatase: 73 U/L (ref 38–126)
BILIRUBIN TOTAL: 1.5 mg/dL — AB (ref 0.3–1.2)
BUN: 16 mg/dL (ref 6–20)
CO2: 11 mmol/L — ABNORMAL LOW (ref 22–32)
Calcium: 10.2 mg/dL (ref 8.9–10.3)
Chloride: 89 mmol/L — ABNORMAL LOW (ref 101–111)
Creatinine, Ser: 1.18 mg/dL — ABNORMAL HIGH (ref 0.44–1.00)
Glucose, Bld: 929 mg/dL (ref 65–99)
POTASSIUM: 3.3 mmol/L — AB (ref 3.5–5.1)
Sodium: 124 mmol/L — ABNORMAL LOW (ref 135–145)
TOTAL PROTEIN: 8.2 g/dL — AB (ref 6.5–8.1)

## 2017-08-27 MED ORDER — SODIUM CHLORIDE 0.9 % IV BOLUS (SEPSIS)
1000.0000 mL | Freq: Once | INTRAVENOUS | Status: AC
  Administered 2017-08-27: 1000 mL via INTRAVENOUS

## 2017-08-27 MED ORDER — POTASSIUM CHLORIDE 10 MEQ/100ML IV SOLN
10.0000 meq | INTRAVENOUS | Status: AC
  Administered 2017-08-28 (×2): 10 meq via INTRAVENOUS
  Filled 2017-08-27 (×2): qty 100

## 2017-08-27 MED ORDER — SODIUM CHLORIDE 0.9 % IV SOLN
INTRAVENOUS | Status: DC
  Administered 2017-08-27: 20:00:00 via INTRAVENOUS

## 2017-08-27 MED ORDER — POTASSIUM CHLORIDE 10 MEQ/100ML IV SOLN
10.0000 meq | Freq: Once | INTRAVENOUS | Status: AC
  Administered 2017-08-27: 10 meq via INTRAVENOUS
  Filled 2017-08-27: qty 100

## 2017-08-27 MED ORDER — DEXTROSE-NACL 5-0.45 % IV SOLN: INTRAVENOUS | Status: DC

## 2017-08-27 MED ORDER — SODIUM CHLORIDE 0.9 % IV SOLN
INTRAVENOUS | Status: AC
  Administered 2017-08-27: 5.4 [IU]/h via INTRAVENOUS
  Administered 2017-08-28: 10.6 [IU]/h via INTRAVENOUS
  Filled 2017-08-27 (×3): qty 1

## 2017-08-27 NOTE — ED Notes (Signed)
ED Provider at bedside. 

## 2017-08-27 NOTE — ED Provider Notes (Signed)
[facility] EMERGENCY DEPARTMENT Provider Note   CSN: [id number] Arrival date & time: 08/27/17  1555     History   Chief Complaint Chief Complaint  Patient presents with  . Hyperglycemia    HPI Kellie Shaffer is a 27 y.o. female.  HPI Patient presents with hyperglycemia.  She has a history of ITP and had been on steroids.  Around 1 week ago finished up a month and a half course.  Around 2 weeks ago was seen in the ER with hyperglycemia more than 600.  Started on metformin at that time.  States sugars have continued to run high.  States that they were "high" on her meter yesterday and had been elevated a few days before that also.  Otherwise numbers will be up in the 400s.  Has been urinating frequently.  Getting up at night.  She is thirsty all the time but really does not have an appetite.  States her appetite is decreased since she got off her steroids.  No fevers or chills.  No chest pain or trouble breathing. Past Medical History:  Diagnosis Date  . Abortion May 2013  . Anemia   . Atrial fibrillation (HCC)   . Hypercholesteremia   . Hypertension   . Hyperthyroidism   . Iron deficiency anemia 09/04/2016  . Ovarian cyst     Patient Active Problem List   Diagnosis Date Noted  . Hyperglycemia 08/13/2017  . Oral candidiasis 08/13/2017  . Hypokalemia 07/19/2017  . Acute appendicitis 01/14/2017  . Iron deficiency anemia 09/04/2016  . Atrial flutter (HCC) 07/05/2014  . Hyperthyroidism 07/05/2014  . Thrombocytopenia (HCC) 05/27/2014  . Acute appendicitis, uncomplicated 05/26/2014  . Hypercholesteremia     Past Surgical History:  Procedure Laterality Date  . LAPAROSCOPIC APPENDECTOMY N/A 01/14/2017   Procedure: APPENDECTOMY LAPAROSCOPIC;  Surgeon: Claud KelpIngram, Haywood, MD;  Location: [facility];  Service: General;  Laterality: N/A;    OB History    Gravida Para Term Preterm AB Living   1             SAB TAB Ectopic Multiple Live Births                    Home Medications    Prior to Admission medications   Medication Sig Start Date End Date Taking? Authorizing Provider  amLODipine (NORVASC) 5 MG tablet Take 1 tablet (5 mg total) by mouth daily. 03/28/17   Antony MaduraHumes, Kelly, PA-C  Biotin [id number] MCG TABS Take 1,000 mcg by mouth daily.     [provider]  cetirizine (ZYRTEC) 10 MG tablet Take 10 mg by mouth daily as needed for allergies.     [provider]  hydrochlorothiazide (HYDRODIURIL) 25 MG tablet Take 1 tablet (25 mg total) by mouth daily. 07/07/17   Romero BellingEllison, Sean, MD  IRON PO Take 1 tablet by mouth daily.     [provider]  metFORMIN (GLUCOPHAGE) 500 MG tablet Take 1 tablet (500 mg total) by mouth 2 (two) times daily with a meal. 08/13/17   Alvira MondaySchlossman, Erin, MD  methimazole (TAPAZOLE) 10 MG tablet Take 1 tablet (10 mg total) by mouth 2 (two) times daily. 03/28/17   Antony MaduraHumes, Kelly, PA-C  metoprolol tartrate (LOPRESSOR) 25 MG tablet Take 0.5 tablets (12.5 mg total) by mouth every morning. 07/07/17   Romero BellingEllison, Sean, MD    Family History Family History  Problem Relation Age of Onset  . Diabetes Other        Parent  .  Cancer Other        Breast Cancer-Parent  . Hypertension Other        Parent  . Hyperlipidemia Other        Parent  . Arthritis Other        Parent    Social History Social History   Tobacco Use  . Smoking status: Current Every Day Smoker    Packs/day: 0.25    Types: Cigarettes  . Smokeless tobacco: Never Used  Substance Use Topics  . Alcohol use: Yes    Alcohol/week: 0.0 oz    Comment: 2-3 times a month   . Drug use: No     Allergies   Benadryl [diphenhydramine hcl] and Chloroxine   Review of Systems Review of Systems  Constitutional: Positive for fatigue. Negative for appetite change and fever.  HENT: Negative for congestion.   Respiratory: Negative for chest tightness and shortness of breath.   Gastrointestinal: Negative for abdominal pain.  Endocrine: Positive for  polydipsia and polyuria.  Genitourinary: Negative for difficulty urinating and vaginal discharge.  Musculoskeletal: Negative for back pain.  Skin: Negative for rash.  Neurological: Negative for weakness.  Hematological: Negative for adenopathy.  Psychiatric/Behavioral: Negative for confusion.     Physical Exam Updated Vital Signs BP 128/77 (BP Location: Left Arm)   Pulse 95   Temp 97.8 F (36.6 C) (Oral)   Resp 20   Ht 5\' 9"  (1.753 m)   Wt 95.7 kg (211 lb)   LMP 08/13/2017   SpO2 100%   BMI 31.16 kg/m   Physical Exam  Constitutional: She appears well-developed.  HENT:  Head: Atraumatic.  Mucous membranes are dry  Eyes: Pupils are equal, round, and reactive to light.  Cardiovascular: Normal rate.  Pulmonary/Chest: Effort normal.  Abdominal: Soft. There is no tenderness.  Musculoskeletal: She exhibits no edema.  Neurological: She is alert.  Skin: Skin is warm. Capillary refill takes less than 2 seconds.  Psychiatric: She has a normal mood and affect.     ED Treatments / Results  Labs (all labs ordered are listed, but only abnormal results are displayed) Labs Reviewed  COMPREHENSIVE METABOLIC PANEL - Abnormal; Notable for the following components:      Result Value   Sodium 124 (*)    Potassium 3.3 (*)    Chloride 89 (*)    CO2 11 (*)    Glucose, Bld 929 (*)    Creatinine, Ser 1.18 (*)    Total Protein 8.2 (*)    AST 14 (*)    Total Bilirubin 1.5 (*)    Anion gap 24 (*)    All other components within normal limits  CBC WITH DIFFERENTIAL/PLATELET - Abnormal; Notable for the following components:   MCV 77.5 (*)    All other components within normal limits  URINALYSIS, ROUTINE W REFLEX MICROSCOPIC - Abnormal; Notable for the following components:   Glucose, UA >=500 (*)    Ketones, ur >80 (*)    All other components within normal limits  URINALYSIS, MICROSCOPIC (REFLEX) - Abnormal; Notable for the following components:   Bacteria, UA RARE (*)    Squamous  Epithelial / LPF 0-5 (*)    All other components within normal limits  CBG MONITORING, ED - Abnormal; Notable for the following components:   Glucose-Capillary >600 (*)    All other components within normal limits  I-STAT VENOUS BLOOD GAS, ED - Abnormal; Notable for the following components:   pCO2, Ven 24.5 (*)    pO2,  Ven 81.0 (*)    Bicarbonate 12.2 (*)    TCO2 13 (*)    Acid-base deficit 12.0 (*)    All other components within normal limits  PREGNANCY, URINE  BLOOD GAS, VENOUS    EKG  EKG Interpretation None       Radiology No results found.  Procedures Procedures (including critical care time)  Medications Ordered in ED Medications  sodium chloride 0.9 % bolus 1,000 mL (1,000 mLs Intravenous New Bag/Given 08/27/17 1802)  potassium chloride 10 mEq in 100 mL IVPB (10 mEq Intravenous New Bag/Given 08/27/17 1800)  sodium chloride 0.9 % bolus 1,000 mL (0 mLs Intravenous Stopped 08/27/17 1802)     Initial Impression / Assessment and Plan / ED Course  I have reviewed the triage vital signs and the nursing notes.  Pertinent labs & imaging results that were available during my care of the patient were reviewed by me and considered in my medical decision making (see chart for details).     Patient presents with hyperglycemia at home.  Began secondary to steroids for her ITP.  She now is in DKA with a anion gap of 24 bicarb of 11 but normal pH.  Greater than 80 ketones in the urine.  Also hypokalemia.  Will admit to hospitalist for stepdown treatment.  CRITICAL CARE Performed by: Benjiman Core Total critical care time: 30 minutes Critical care time was exclusive of separately billable procedures and treating other patients. Critical care was necessary to treat or prevent imminent or life-threatening deterioration. Critical care was time spent personally by me on the following activities: development of treatment plan with patient and/or surrogate as well as nursing,  discussions with consultants, evaluation of patient's response to treatment, examination of patient, obtaining history from patient or surrogate, ordering and performing treatments and interventions, ordering and review of laboratory studies, ordering and review of radiographic studies, pulse oximetry and re-evaluation of patient's condition.   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

## 2017-08-27 NOTE — ED Notes (Signed)
Pt on cardiac monitor and auto VS 

## 2017-08-27 NOTE — ED Notes (Signed)
Delay in administration of potassium until second IV is obtained.

## 2017-08-27 NOTE — ED Notes (Signed)
Pt ambulatory to bathroom

## 2017-08-27 NOTE — ED Triage Notes (Signed)
Patient states that she was recently taken off of steroids- she was taken off because her Blood Sugars were running high. The patient states that she is having generalized weakness and Heart rate is fluctuating and her body just doesn't "feel right" - patient is tearful in triage because she is frustrated

## 2017-08-28 DIAGNOSIS — E876 Hypokalemia: Secondary | ICD-10-CM

## 2017-08-28 DIAGNOSIS — E0910 Drug or chemical induced diabetes mellitus with ketoacidosis without coma: Secondary | ICD-10-CM

## 2017-08-28 DIAGNOSIS — E0590 Thyrotoxicosis, unspecified without thyrotoxic crisis or storm: Secondary | ICD-10-CM

## 2017-08-28 LAB — BASIC METABOLIC PANEL
ANION GAP: 7 (ref 5–15)
ANION GAP: 6 (ref 5–15)
Anion gap: 8 (ref 5–15)
Anion gap: 8 (ref 5–15)
BUN: 12 mg/dL (ref 6–20)
BUN: 11 mg/dL (ref 6–20)
BUN: 13 mg/dL (ref 6–20)
BUN: 14 mg/dL (ref 6–20)
CALCIUM: 8.8 mg/dL — AB (ref 8.9–10.3)
CO2: 21 mmol/L — ABNORMAL LOW (ref 22–32)
CO2: 21 mmol/L — ABNORMAL LOW (ref 22–32)
CO2: 19 mmol/L — AB (ref 22–32)
CO2: 19 mmol/L — ABNORMAL LOW (ref 22–32)
CREATININE: 0.64 mg/dL (ref 0.44–1.00)
Calcium: 10 mg/dL (ref 8.9–10.3)
Calcium: 9.9 mg/dL (ref 8.9–10.3)
Calcium: 9.2 mg/dL (ref 8.9–10.3)
Chloride: 106 mmol/L (ref 101–111)
Chloride: 108 mmol/L (ref 101–111)
Chloride: 107 mmol/L (ref 101–111)
Chloride: 110 mmol/L (ref 101–111)
Creatinine, Ser: 0.55 mg/dL (ref 0.44–1.00)
Creatinine, Ser: 0.62 mg/dL (ref 0.44–1.00)
Creatinine, Ser: 0.6 mg/dL (ref 0.44–1.00)
GFR calc Af Amer: >60 mL/min (ref >60)
GFR calc Af Amer: >60 mL/min (ref >60)
GFR calc Af Amer: >60 mL/min (ref >60)
GFR calc Af Amer: >60 mL/min (ref >60)
GFR calc non Af Amer: >60 mL/min (ref >60)
GLUCOSE: 141 mg/dL — AB (ref 65–99)
GLUCOSE: 264 mg/dL — AB (ref 65–99)
GLUCOSE: 137 mg/dL — AB (ref 65–99)
Glucose, Bld: 226 mg/dL — ABNORMAL HIGH (ref 65–99)
POTASSIUM: 2.6 mmol/L — AB (ref 3.5–5.1)
POTASSIUM: 2.9 mmol/L — AB (ref 3.5–5.1)
POTASSIUM: 3.2 mmol/L — AB (ref 3.5–5.1)
Potassium: 3.2 mmol/L — ABNORMAL LOW (ref 3.5–5.1)
Sodium: 135 mmol/L (ref 135–145)
Sodium: 137 mmol/L (ref 135–145)
Sodium: 133 mmol/L — ABNORMAL LOW (ref 135–145)
Sodium: 135 mmol/L (ref 135–145)

## 2017-08-28 LAB — GLUCOSE, CAPILLARY
GLUCOSE-CAPILLARY: 155 mg/dL — AB (ref 65–99)
GLUCOSE-CAPILLARY: 129 mg/dL — AB (ref 65–99)
GLUCOSE-CAPILLARY: 200 mg/dL — AB (ref 65–99)
GLUCOSE-CAPILLARY: 309 mg/dL — AB (ref 65–99)
GLUCOSE-CAPILLARY: 333 mg/dL — AB (ref 65–99)
GLUCOSE-CAPILLARY: 147 mg/dL — AB (ref 65–99)
GLUCOSE-CAPILLARY: 150 mg/dL — AB (ref 65–99)
GLUCOSE-CAPILLARY: 135 mg/dL — AB (ref 65–99)
Glucose-Capillary: 246 mg/dL — ABNORMAL HIGH (ref 65–99)
Glucose-Capillary: 239 mg/dL — ABNORMAL HIGH (ref 65–99)
Glucose-Capillary: 230 mg/dL — ABNORMAL HIGH (ref 65–99)
Glucose-Capillary: 192 mg/dL — ABNORMAL HIGH (ref 65–99)
Glucose-Capillary: 119 mg/dL — ABNORMAL HIGH (ref 65–99)
Glucose-Capillary: 123 mg/dL — ABNORMAL HIGH (ref 65–99)
Glucose-Capillary: 389 mg/dL — ABNORMAL HIGH (ref 65–99)
Glucose-Capillary: 174 mg/dL — ABNORMAL HIGH (ref 65–99)
Glucose-Capillary: 200 mg/dL — ABNORMAL HIGH (ref 65–99)
Glucose-Capillary: 136 mg/dL — ABNORMAL HIGH (ref 65–99)
Glucose-Capillary: 131 mg/dL — ABNORMAL HIGH (ref 65–99)

## 2017-08-28 LAB — MRSA PCR SCREENING: MRSA by PCR: NEGATIVE

## 2017-08-28 LAB — HEMOGLOBIN A1C
HEMOGLOBIN A1C: 11.5 % — AB (ref 4.8–5.6)
Mean Plasma Glucose: 283.35 mg/dL

## 2017-08-28 LAB — TSH: TSH: 0.896 u[IU]/mL (ref 0.350–4.500)

## 2017-08-28 LAB — CBG MONITORING, ED: GLUCOSE-CAPILLARY: 273 mg/dL — AB (ref 65–99)

## 2017-08-28 MED ORDER — INSULIN REGULAR HUMAN 100 UNIT/ML IJ SOLN
INTRAMUSCULAR | Status: DC
  Administered 2017-08-28: 2.7 [IU]/h via INTRAVENOUS
  Administered 2017-08-28: 18:00:00 via INTRAVENOUS
  Filled 2017-08-28 (×2): qty 1

## 2017-08-28 MED ORDER — CETIRIZINE HCL 10 MG PO TABS
10.0000 mg | ORAL_TABLET | Freq: Every day | ORAL | Status: DC
  Administered 2017-08-28 – 2017-08-31 (×4): 10 mg via ORAL
  Filled 2017-08-28 (×4): qty 1

## 2017-08-28 MED ORDER — POTASSIUM CHLORIDE 10 MEQ/100ML IV SOLN
10.0000 meq | INTRAVENOUS | Status: DC
  Filled 2017-08-28: qty 100

## 2017-08-28 MED ORDER — BIOTIN 10000 MCG PO TABS: 1000.0000 ug | ORAL_TABLET | Freq: Every day | ORAL | Status: DC

## 2017-08-28 MED ORDER — POTASSIUM CHLORIDE CRYS ER 20 MEQ PO TBCR
40.0000 meq | EXTENDED_RELEASE_TABLET | ORAL | Status: AC
  Administered 2017-08-28 (×2): 40 meq via ORAL
  Filled 2017-08-28 (×2): qty 2

## 2017-08-28 MED ORDER — INSULIN GLARGINE 100 UNIT/ML ~~LOC~~ SOLN
15.0000 [IU] | Freq: Every day | SUBCUTANEOUS | Status: DC
  Administered 2017-08-28: 15 [IU] via SUBCUTANEOUS
  Filled 2017-08-28 (×2): qty 0.15

## 2017-08-28 MED ORDER — POTASSIUM CHLORIDE CRYS ER 20 MEQ PO TBCR
40.0000 meq | EXTENDED_RELEASE_TABLET | Freq: Once | ORAL | Status: AC
  Administered 2017-08-28: 40 meq via ORAL
  Filled 2017-08-28: qty 2

## 2017-08-28 MED ORDER — POTASSIUM CHLORIDE 10 MEQ/100ML IV SOLN: 10.0000 meq | INTRAVENOUS | Status: DC

## 2017-08-28 MED ORDER — AMLODIPINE BESYLATE 5 MG PO TABS
5.0000 mg | ORAL_TABLET | Freq: Every day | ORAL | Status: DC
  Administered 2017-08-28 – 2017-08-30 (×3): 5 mg via ORAL
  Filled 2017-08-28 (×3): qty 1

## 2017-08-28 MED ORDER — POTASSIUM CHLORIDE 10 MEQ/100ML IV SOLN
INTRAVENOUS | Status: AC
  Administered 2017-08-28: 10 meq via INTRAVENOUS
  Filled 2017-08-28: qty 100

## 2017-08-28 MED ORDER — POTASSIUM CHLORIDE 10 MEQ/100ML IV SOLN
10.0000 meq | INTRAVENOUS | Status: AC
  Administered 2017-08-28 (×2): 10 meq via INTRAVENOUS
  Filled 2017-08-28: qty 100

## 2017-08-28 MED ORDER — INSULIN ASPART 100 UNIT/ML ~~LOC~~ SOLN
6.0000 [IU] | Freq: Three times a day (TID) | SUBCUTANEOUS | Status: DC
  Administered 2017-08-29 – 2017-08-30 (×3): 6 [IU] via SUBCUTANEOUS

## 2017-08-28 MED ORDER — INSULIN ASPART 100 UNIT/ML ~~LOC~~ SOLN: 0.0000 [IU] | Freq: Three times a day (TID) | SUBCUTANEOUS | Status: DC

## 2017-08-28 MED ORDER — INSULIN ASPART 100 UNIT/ML ~~LOC~~ SOLN: 3.0000 [IU] | Freq: Three times a day (TID) | SUBCUTANEOUS | Status: DC

## 2017-08-28 MED ORDER — INSULIN ASPART 100 UNIT/ML ~~LOC~~ SOLN: 0.0000 [IU] | Freq: Every day | SUBCUTANEOUS | Status: DC

## 2017-08-28 MED ORDER — INSULIN ASPART 100 UNIT/ML ~~LOC~~ SOLN
0.0000 [IU] | SUBCUTANEOUS | Status: DC
  Administered 2017-08-28: 2 [IU] via SUBCUTANEOUS
  Administered 2017-08-29: 12 [IU] via SUBCUTANEOUS
  Administered 2017-08-29: 8 [IU] via SUBCUTANEOUS

## 2017-08-28 MED ORDER — POTASSIUM CHLORIDE IN NACL 20-0.9 MEQ/L-% IV SOLN
INTRAVENOUS | Status: DC
  Administered 2017-08-28 – 2017-08-29 (×3): via INTRAVENOUS
  Filled 2017-08-28 (×7): qty 1000

## 2017-08-28 MED ORDER — SODIUM CHLORIDE 0.9 % IV SOLN: INTRAVENOUS | Status: DC

## 2017-08-28 MED ORDER — METHIMAZOLE 10 MG PO TABS
10.0000 mg | ORAL_TABLET | Freq: Two times a day (BID) | ORAL | Status: DC
Start: 2017-08-28 — End: 2017-08-31
  Administered 2017-08-28 – 2017-08-31 (×7): 10 mg via ORAL
  Filled 2017-08-28 (×7): qty 1

## 2017-08-28 MED ORDER — DEXTROSE-NACL 5-0.45 % IV SOLN
INTRAVENOUS | Status: DC
  Administered 2017-08-28: 03:00:00 via INTRAVENOUS

## 2017-08-28 MED ORDER — ENOXAPARIN SODIUM 40 MG/0.4ML ~~LOC~~ SOLN
40.0000 mg | SUBCUTANEOUS | Status: DC
  Administered 2017-08-28 – 2017-08-31 (×4): 40 mg via SUBCUTANEOUS
  Filled 2017-08-28 (×4): qty 0.4

## 2017-08-28 MED ORDER — CETIRIZINE HCL 5 MG/5ML PO SOLN
10.0000 mg | Freq: Once | ORAL | Status: AC
  Administered 2017-08-28: 10 mg via ORAL
  Filled 2017-08-28: qty 10

## 2017-08-28 MED ORDER — LORATADINE 10 MG PO TABS: 10.0000 mg | ORAL_TABLET | Freq: Every day | ORAL | Status: DC

## 2017-08-28 NOTE — H&P (Signed)
History and Physical    Kellie Shaffer ZOX:[id number] DOB: [birth year] DOA: 08/27/2017  PCP: Patient, No Pcp Per  Patient coming from: Home  I have personally briefly [facility]  Chief Complaint: Hyperglycemia  HPI: Kellie RockerMarkysha M Shimer is a 27 y.o. female with medical history significant of ITP, hyperthyroidism.  Finished up month and a half course of steroids about 1 week ago.  2 weeks ago seen in ER with hyperglycemia, BGL more than 600.  Started on metformin.  Since that time BGL has continued to run high.  Were "high" on meter yesterday.  Returns to ED for high BGLs.   ED Course: Found to be in DKA with AG 24, bicarb 11, BGL 929.   Review of Systems: As per HPI otherwise 10 point review of systems negative.   Past Medical History:  Diagnosis Date  . Abortion May 2013  . Anemia   . Atrial fibrillation (HCC)   . Hypercholesteremia   . Hypertension   . Hyperthyroidism   . Iron deficiency anemia 09/04/2016  . Ovarian cyst     Past Surgical History:  Procedure Laterality Date  . LAPAROSCOPIC APPENDECTOMY N/A 01/14/2017   Procedure: APPENDECTOMY LAPAROSCOPIC;  Surgeon: Claud KelpIngram, Haywood, MD;  Location: [facility];  Service: General;  Laterality: N/A;     reports that she has been smoking cigarettes.  She has been smoking about 0.25 packs per day. she has never used smokeless tobacco. She reports that she drinks alcohol. She reports that she does not use drugs.  Allergies  Allergen Reactions  . Benadryl [Diphenhydramine Hcl] Anaphylaxis and Hives  . Chloroxine Hives    Clorox    Family History  Problem Relation Age of Onset  . Diabetes Other        Parent  . Cancer Other        Breast Cancer-Parent  . Hypertension Other        Parent  . Hyperlipidemia Other        Parent  . Arthritis Other        Parent     Prior to Admission medications   Medication Sig Start Date End Date Taking? Authorizing Provider    amLODipine (NORVASC) 5 MG tablet Take 1 tablet (5 mg total) by mouth daily. 03/28/17  Yes Antony MaduraHumes, Kelly, PA-C  Biotin [id number] MCG TABS Take 1,000 mcg by mouth daily.    Yes [provider]  cetirizine (ZYRTEC) 10 MG tablet Take 10 mg by mouth daily as needed for allergies.    Yes [provider]  hydrochlorothiazide (HYDRODIURIL) 25 MG tablet Take 1 tablet (25 mg total) by mouth daily. 07/07/17  Yes Romero BellingEllison, Sean, MD  IRON PO Take 1 tablet by mouth daily.    Yes [provider]  methimazole (TAPAZOLE) 10 MG tablet Take 1 tablet (10 mg total) by mouth 2 (two) times daily. 03/28/17  Yes Antony MaduraHumes, Kelly, PA-C  metoprolol tartrate (LOPRESSOR) 25 MG tablet Take 0.5 tablets (12.5 mg total) by mouth every morning. 07/07/17  Yes Romero BellingEllison, Sean, MD    Physical Exam: Vitals:   08/27/17 2240 08/27/17 2300 08/27/17 2330 08/28/17 0015  BP: 118/71 115/66 115/64 114/69  Pulse: 95 75 72 77  Resp: 17 17 15 14   Temp:      TempSrc:      SpO2: 100% 100% 100% 100%  Weight:      Height:        Constitutional: NAD, calm, comfortable Eyes:  PERRL, lids and conjunctivae normal ENMT: Mucous membranes are moist. Posterior pharynx clear of any exudate or lesions.Normal dentition.  Neck: normal, supple, no masses, no thyromegaly Respiratory: clear to auscultation bilaterally, no wheezing, no crackles. Normal respiratory effort. No accessory muscle use.  Cardiovascular: Regular rate and rhythm, no murmurs / rubs / gallops. No extremity edema. 2+ pedal pulses. No carotid bruits.  Abdomen: no tenderness, no masses palpated. No hepatosplenomegaly. Bowel sounds positive.  Musculoskeletal: no clubbing / cyanosis. No joint deformity upper and lower extremities. Good ROM, no contractures. Normal muscle tone.  Skin: no rashes, lesions, ulcers. No induration Neurologic: CN 2-12 grossly intact. Sensation intact, DTR normal. Strength 5/5 in all 4.  Psychiatric: Normal judgment and insight. Alert and  oriented x 3. Normal mood.    Labs on Admission: I have personally reviewed following labs and imaging studies  CBC: Recent Labs  Lab 08/23/17 1043 08/27/17 1648  WBC 9.1 8.5  NEUTROABS 5.1 5.9  HGB 13.3 13.5  HCT 38.5 39.3  MCV 77.8* 77.5*  PLT 219 330   Basic Metabolic Panel: Recent Labs  Lab 08/23/17 1043 08/27/17 1648 08/27/17 2302  NA 132* 124* 134*  K 2.6* 3.3* 2.6*  CL 93* 89* 104  CO2 22 11* 15*  GLUCOSE 370* 929* 351*  BUN 11 16 13   CREATININE 1.01 1.18* 0.84  CALCIUM 10.7* 10.2 10.1   GFR: Estimated Creatinine Clearance: 123.9 mL/min (by C-G formula based on SCr of 0.84 mg/dL). Liver Function Tests: Recent Labs  Lab 08/23/17 1043 08/27/17 1648  AST 15 14*  ALT 33 30  ALKPHOS 90 73  BILITOT 0.4 1.5*  PROT 8.3 8.2*  ALBUMIN 4.3 4.4   No results for input(s): LIPASE, AMYLASE in the last 168 hours. No results for input(s): AMMONIA in the last 168 hours. Coagulation Profile: No results for input(s): INR, PROTIME in the last 168 hours. Cardiac Enzymes: No results for input(s): CKTOTAL, CKMB, CKMBINDEX, TROPONINI in the last 168 hours. BNP (last 3 results) No results for input(s): PROBNP in the last 8760 hours. HbA1C: No results for input(s): HGBA1C in the last 72 hours. CBG: Recent Labs  Lab 08/27/17 2231 08/27/17 2333 08/28/17 0039 08/28/17 0202 08/28/17 0306  GLUCAP 359* 312* 273* 246* 239*   Lipid Profile: No results for input(s): CHOL, HDL, LDLCALC, TRIG, CHOLHDL, LDLDIRECT in the last 72 hours. Thyroid Function Tests: No results for input(s): TSH, T4TOTAL, FREET4, T3FREE, THYROIDAB in the last 72 hours. Anemia Panel: No results for input(s): VITAMINB12, FOLATE, FERRITIN, TIBC, IRON, RETICCTPCT in the last 72 hours. Urine analysis:    Component Value Date/Time   COLORURINE YELLOW 08/27/2017 1649   APPEARANCEUR CLEAR 08/27/2017 1649   LABSPEC 1.010 08/27/2017 1649   PHURINE 5.5 08/27/2017 1649   GLUCOSEU >=500 (A) 08/27/2017 1649     HGBUR NEGATIVE 08/27/2017 1649   BILIRUBINUR NEGATIVE 08/27/2017 1649   KETONESUR >80 (A) 08/27/2017 1649   PROTEINUR NEGATIVE 08/27/2017 1649   UROBILINOGEN 1.0 05/09/2015 2240   NITRITE NEGATIVE 08/27/2017 1649   LEUKOCYTESUR NEGATIVE 08/27/2017 1649    Radiological Exams on Admission: No results found.  EKG: Independently reviewed.  Assessment/Plan Principal Problem:   DKA (diabetic ketoacidoses) (HCC) Active Problems:   Hyperthyroidism    1. DKA - 1. DKA pathway 2. IVF: 2L bolus in ED then 125 cc/hr NS then 100 cc/hr D5 half 3. Insulin gtt 4. BMP Q4H per pathway 2. Hypokalemia - 1. 3 runs IV K finishing now, ordering 2 more 2. Repeat BMP,  suspect it will still be low though 3. Hyperthyroidism - 1. Cont tapazole  DVT prophylaxis: Lovenox Code Status: Full Family Communication: No family in room Disposition Plan: Home after admit Consults called: None Admission status: Admit to inpatient - inpatient status for treatment of DKA   Hillary Bow DO [facility] Pager [phone]  If 7AM-7PM, please contact day team taking care of patient [email or web address] Password [facility]  08/28/2017, 3:18 AM

## 2017-08-28 NOTE — Progress Notes (Signed)
MD made aware of increase in insulin gtt RT:cbg (389) after eating, stated to keep patient on insulin gtt and she will no transfer off unit later today as planned, patient updated on plan of care

## 2017-08-28 NOTE — ED Notes (Signed)
[facility] arrived to transport pt [facility]

## 2017-08-28 NOTE — Progress Notes (Signed)
[facility] PROGRESS NOTE  Kellie Shaffer  ZOX:[id number] DOB: 04-Nov-1990 DOA: 08/27/2017 PCP: Patient, No Pcp Per Outpatient Specialists: Endocrinology, Everardo AllEllison Brief Narrative: Kellie RockerMarkysha M Schlotter is a 27 y.o. female with a history of hyperthyroidism, ITP recently treated with prolonged steroids who presented to the ED for the 2nd time for hyperglycemia, found to be in DKA, admitted on insulin infusion 1/11.    Subjective: Feels tired and hungry. No fevers, illness. Reports blurry vision, polyuria, polydipsia recently.   Objective: BP 134/74   Pulse 77   Temp 98.1 F (36.7 C) (Oral)   Resp 13   Ht 5\' 9"  (1.753 m)   Wt 95.7 kg (211 lb)   LMP 08/13/2017   SpO2 100%   BMI 31.16 kg/m   Gen: Non-toxic 26yo F in no distress Neck: No palpable goiter/nodules, supple Pulm: Clear and nonlabored on room air  CV: RRR, no murmur, no JVD, no edema GI: Soft, NT, ND, +BS  Neuro: Alert and oriented. No focal deficits. Ext: Warm, no deformities Skin: No rashes, lesions, acanthosis  Assessment & Plan: Principal Problem:   DKA (diabetic ketoacidoses) (HCC) Active Problems:   Hyperthyroidism  Hyperglycemia: With +FH of DM, history of other autoimmune diseases, I suspect steroids may have uncovered IDDM (not just caused transient hyperglycemia severe enough to cause DKA). HbA1c 11.5% (Avg CBG ~280mg /dl over 3 months) - Transition to lantus 15u (based on 0.15u/kg) and mealtime insulin (3u TIDWC, below 0.05u/kg) with SSI correction and HS correction. Will stop insulin gtt 2 hours after lantus given.  - Diabetes coordinator consulted - Check HbA1c and c-peptide - Carb-modified diet  Hypokalemia: Refractory to replacement.  - Repeat po 40mEq x2, then recheck this PM.  - Hopeful for improvement with replacement and starting diet.   ITP: Platelets now wnl, having completed prednisone (x~2 months), followed by Dr. Arbutus PedMohamed.  - Monitor CBC while inpatient - Needs repeat CBC, CMP, and  LDH in 3 months.  - Ok to give DVT ppx  Hyperthyroidism: Followed by Dr. Everardo AllEllison. TSH 0.896 - Continue methimazole  Hazeline Junkeryan Shelton Soler, MD [facility] Pager [phone]  If 7PM-7AM, please contact night-coverage [email or web address] Password [email or web address] 08/28/2017, 9:42 AM

## 2017-08-28 NOTE — Progress Notes (Signed)
CRITICAL VALUE ALERT  Critical Value:  Potassium 2.6  Date & Time Notied:  08-28-17 0445  Provider Notified: Julian ReilGardner MD  Orders Received/Actions taken: New orders for IV & PO potassium

## 2017-08-28 NOTE — ED Notes (Signed)
Prior to [facility] transporting pt, pt developed hives to bilat arms. Pt denies SOB or itching. Pt denies any allergies other than benadryl and chloroxine. EDP made aware. [facility] made aware. See MAR for treatment. [facility] left facility with pt.

## 2017-08-28 NOTE — Progress Notes (Signed)
Has gotten about half of 1 run since BMP showing 2.6.  Will order another 2 runs (they are going at half rate due to patient tolerance), and order PO x1 now, so over the next 4 hours (at which point next BMP due)

## 2017-08-29 LAB — GLUCOSE, CAPILLARY
GLUCOSE-CAPILLARY: 256 mg/dL — AB (ref 65–99)
GLUCOSE-CAPILLARY: 358 mg/dL — AB (ref 65–99)
Glucose-Capillary: 222 mg/dL — ABNORMAL HIGH (ref 65–99)
Glucose-Capillary: 321 mg/dL — ABNORMAL HIGH (ref 65–99)
Glucose-Capillary: 284 mg/dL — ABNORMAL HIGH (ref 65–99)

## 2017-08-29 LAB — C-PEPTIDE: C PEPTIDE: 1.1 ng/mL (ref 1.1–4.4)

## 2017-08-29 LAB — BASIC METABOLIC PANEL
Anion gap: 7 (ref 5–15)
BUN: 12 mg/dL (ref 6–20)
CALCIUM: 8.3 mg/dL — AB (ref 8.9–10.3)
CO2: 18 mmol/L — ABNORMAL LOW (ref 22–32)
CREATININE: 0.53 mg/dL (ref 0.44–1.00)
Chloride: 111 mmol/L (ref 101–111)
GFR calc Af Amer: >60 mL/min (ref >60)
GFR calc non Af Amer: >60 mL/min (ref >60)
Glucose, Bld: 260 mg/dL — ABNORMAL HIGH (ref 65–99)
Potassium: 3.2 mmol/L — ABNORMAL LOW (ref 3.5–5.1)
SODIUM: 136 mmol/L (ref 135–145)

## 2017-08-29 MED ORDER — LIVING WELL WITH DIABETES BOOK
Freq: Once | Status: AC
  Administered 2017-08-30: 03:00:00
  Filled 2017-08-29: qty 1

## 2017-08-29 MED ORDER — INSULIN ASPART 100 UNIT/ML ~~LOC~~ SOLN
0.0000 [IU] | Freq: Three times a day (TID) | SUBCUTANEOUS | Status: DC
  Administered 2017-08-29: 15 [IU] via SUBCUTANEOUS
  Administered 2017-08-30: 11 [IU] via SUBCUTANEOUS

## 2017-08-29 MED ORDER — INSULIN ASPART 100 UNIT/ML ~~LOC~~ SOLN
0.0000 [IU] | Freq: Every day | SUBCUTANEOUS | Status: DC
  Administered 2017-08-29: 3 [IU] via SUBCUTANEOUS

## 2017-08-29 MED ORDER — INSULIN GLARGINE 100 UNIT/ML ~~LOC~~ SOLN
30.0000 [IU] | Freq: Every day | SUBCUTANEOUS | Status: DC
  Administered 2017-08-29: 30 [IU] via SUBCUTANEOUS
  Filled 2017-08-29: qty 0.3

## 2017-08-29 MED ORDER — POTASSIUM CHLORIDE CRYS ER 20 MEQ PO TBCR
40.0000 meq | EXTENDED_RELEASE_TABLET | Freq: Once | ORAL | Status: AC
  Administered 2017-08-29: 40 meq via ORAL
  Filled 2017-08-29: qty 2

## 2017-08-29 MED ORDER — INSULIN ASPART 100 UNIT/ML ~~LOC~~ SOLN: 0.0000 [IU] | Freq: Every day | SUBCUTANEOUS | Status: DC

## 2017-08-29 MED ORDER — INSULIN GLARGINE 100 UNIT/ML ~~LOC~~ SOLN
20.0000 [IU] | Freq: Two times a day (BID) | SUBCUTANEOUS | Status: DC
  Administered 2017-08-29: 20 [IU] via SUBCUTANEOUS
  Filled 2017-08-29 (×3): qty 0.2

## 2017-08-29 MED ORDER — INSULIN GLARGINE 100 UNIT/ML ~~LOC~~ SOLN
25.0000 [IU] | Freq: Two times a day (BID) | SUBCUTANEOUS | Status: DC
  Filled 2017-08-29: qty 0.25

## 2017-08-29 MED ORDER — INSULIN ASPART 100 UNIT/ML ~~LOC~~ SOLN
0.0000 [IU] | Freq: Three times a day (TID) | SUBCUTANEOUS | Status: DC
  Administered 2017-08-29: 15 [IU] via SUBCUTANEOUS

## 2017-08-29 MED ORDER — HYDROCORTISONE 1 % EX LOTN
TOPICAL_LOTION | Freq: Four times a day (QID) | CUTANEOUS | Status: DC | PRN
  Filled 2017-08-29: qty 118

## 2017-08-29 NOTE — Discharge Instructions (Signed)

## 2017-08-29 NOTE — Progress Notes (Signed)
Inpatient Diabetes Program Recommendations  AACE/ADA: New Consensus Statement on Inpatient Glycemic Control (2015)  Target Ranges:  Prepandial:   less than 140 mg/dL      Peak postprandial:   less than 180 mg/dL (1-2 hours)      Critically ill patients:  140 - 180 mg/dL   Lab Results  Component Value Date   GLUCAP 321 (H) 08/29/2017   HGBA1C 11.5 (H) 08/28/2017    Review of Glycemic Control  Diabetes history: None Outpatient Diabetes medications: None Current orders for Inpatient glycemic control: Lantus 20 units bid, Novolog 0-20 units tidwc and hs + 6 units tidwc HgbA1C - 11.5% C-peptide results not in.  Will need affordable insulin at discharge with no insurance. Insulin titrated this am.  Inpatient Diabetes Program Recommendations:     Increase Novolog to 10 units tidwc If FBS in am is > 150 mg/dL, would increase basal insulin.  70/30 30 units bid for home. Will need PCP to manage her DM. Order case manager consult. Living Well book   Will follow up in am.  Thank you. Kellie Shaffer, RD, LDN, CDE Inpatient Diabetes Coordinator [phone]

## 2017-08-29 NOTE — Progress Notes (Signed)
Provided pt with following clinical reference education printouts:  Coping with Diabetes  Diabetic Ketoacidosis  Tips for Eating Away from Home if You Have Diabetes  Diabetes Mellitus and Nutrition  Insulin Storage and Care  Insulin Injection Instructions, Single Insulin Dose, Adult

## 2017-08-29 NOTE — Progress Notes (Signed)
[facility] PROGRESS NOTE  Kellie Shaffer  WUJ:[id number] DOB: September 06, 1990 DOA: 08/27/2017 PCP: Patient, No Pcp Per Outpatient Specialists: Endocrinology, Everardo AllEllison Brief Narrative: Kellie Shaffer is a 27 y.o. female with a history of hyperthyroidism, ITP recently treated with prolonged steroids who presented to the ED for the 2nd time for hyperglycemia, found to be in DKA, admitted on insulin infusion 1/11. She has required significant insulin, so was kept in the SDU an additional day and transferred to the floor 1/13.    Subjective: Feels better, but feels tired. No sweating, lightheadedness, nausea or vomiting. Vision better. Eating well.    Objective: BP 116/60   Pulse 80   Temp 98.5 F (36.9 C) (Oral)   Resp 16   Ht 5\' 9"  (1.753 m)   Wt 95.7 kg (211 lb)   LMP 08/13/2017   SpO2 100%   BMI 31.16 kg/m   Gen: Non-toxic 26yo F in no distress Neck: No palpable goiter/nodules, supple Pulm: Clear and nonlabored on room air  CV: RRR, no murmur, no JVD, no edema GI: Soft, NT, ND, +BS  Neuro: Alert and oriented. No focal deficits. Ext: Warm, no deformities Skin: Very faint macular eruption diffusely. No wounds.   Assessment & Plan: Principal Problem:   DKA (diabetic ketoacidoses) (HCC) Active Problems:   Hyperthyroidism  Hyperglycemia: With +FH of DM, history of other autoimmune diseases, I suspect steroids may have uncovered IDDM (not just caused transient hyperglycemia severe enough to cause DKA). HbA1c 11.5% (Avg CBG ~280mg /dl over 3 months) - Transition to lantus 30u this morning, give 6u TIDWC + SSI and HS correction (received >60u yesterday). Will follow up CBGs this PM for titration.  - Diabetes coordinator consulted. Pt will require ReliOn 70/30 insulin at discharge. I've asked RN to have pt educated on administration of insulin.  - C-peptide drawn 1/12, pending. - Carb-modified diet.  Hypokalemia: Refractory to replacement.  - Repeat po 40mEq x2, then  recheck this PM.  - Hopeful for improvement with replacement and starting diet.   ITP: Platelets now wnl, having completed prednisone (x~2 months), followed by Dr. Arbutus PedMohamed.  - Monitor CBC while inpatient - Needs repeat CBC, CMP, and LDH in 3 months.  - Ok to give DVT ppx  Hyperthyroidism: Followed by Dr. Everardo AllEllison. TSH 0.896 - Continue methimazole  Contact allergic dermatitis: Suspect due to detergent in the hospital.  - Zyrtec as ordered daily. Has severe allergy to benadryl and would prefer to avoid steroids, so urged her to bathe and put on her home clothes.   Hazeline Junkeryan Landrum Carbonell, MD [facility] Pager [phone]  If 7PM-7AM, please contact night-coverage [email or web address] Password [email or web address] 08/29/2017, 11:19 AM

## 2017-08-29 NOTE — Progress Notes (Signed)
Instructed pt on how to arrange to view educational videos & to watch videos [id number], per MD orders. Understanding verbalized. Kellie Shaffer, [employer] Bath [employer]aylor

## 2017-08-30 LAB — GLUCOSE, CAPILLARY
Glucose-Capillary: 270 mg/dL — ABNORMAL HIGH (ref 65–99)
Glucose-Capillary: 252 mg/dL — ABNORMAL HIGH (ref 65–99)
Glucose-Capillary: 294 mg/dL — ABNORMAL HIGH (ref 65–99)
Glucose-Capillary: 206 mg/dL — ABNORMAL HIGH (ref 65–99)

## 2017-08-30 LAB — BASIC METABOLIC PANEL
Anion gap: 6 (ref 5–15)
BUN: 6 mg/dL (ref 6–20)
CALCIUM: 8.1 mg/dL — AB (ref 8.9–10.3)
CHLORIDE: 106 mmol/L (ref 101–111)
CO2: 23 mmol/L (ref 22–32)
CREATININE: 0.48 mg/dL (ref 0.44–1.00)
GFR calc Af Amer: >60 mL/min (ref >60)
GFR calc non Af Amer: >60 mL/min (ref >60)
Glucose, Bld: 257 mg/dL — ABNORMAL HIGH (ref 65–99)
Potassium: 3 mmol/L — ABNORMAL LOW (ref 3.5–5.1)
Sodium: 135 mmol/L (ref 135–145)

## 2017-08-30 MED ORDER — ACETAMINOPHEN 325 MG PO TABS
650.0000 mg | ORAL_TABLET | ORAL | Status: DC | PRN
  Administered 2017-08-30 – 2017-08-31 (×4): 650 mg via ORAL
  Filled 2017-08-30 (×4): qty 2

## 2017-08-30 MED ORDER — KETOROLAC TROMETHAMINE 15 MG/ML IJ SOLN: 15.0000 mg | Freq: Three times a day (TID) | INTRAMUSCULAR | Status: DC | PRN

## 2017-08-30 MED ORDER — INSULIN ASPART 100 UNIT/ML ~~LOC~~ SOLN: 8.0000 [IU] | Freq: Three times a day (TID) | SUBCUTANEOUS | Status: DC

## 2017-08-30 MED ORDER — POTASSIUM CHLORIDE CRYS ER 20 MEQ PO TBCR
40.0000 meq | EXTENDED_RELEASE_TABLET | Freq: Two times a day (BID) | ORAL | Status: AC
  Administered 2017-08-30 (×2): 40 meq via ORAL
  Filled 2017-08-30 (×2): qty 2

## 2017-08-30 MED ORDER — INSULIN ASPART PROT & ASPART (70-30 MIX) 100 UNIT/ML ~~LOC~~ SUSP
45.0000 [IU] | Freq: Two times a day (BID) | SUBCUTANEOUS | Status: DC
  Administered 2017-08-30 – 2017-08-31 (×2): 45 [IU] via SUBCUTANEOUS
  Filled 2017-08-30: qty 10

## 2017-08-30 MED ORDER — LISINOPRIL 5 MG PO TABS
5.0000 mg | ORAL_TABLET | Freq: Every day | ORAL | Status: DC
  Administered 2017-08-31: 5 mg via ORAL
  Filled 2017-08-30: qty 1

## 2017-08-30 MED ORDER — INSULIN GLARGINE 100 UNIT/ML ~~LOC~~ SOLN
30.0000 [IU] | Freq: Two times a day (BID) | SUBCUTANEOUS | Status: DC
  Administered 2017-08-30: 30 [IU] via SUBCUTANEOUS
  Filled 2017-08-30 (×2): qty 0.3

## 2017-08-30 MED ORDER — INSULIN STARTER KIT- SYRINGES (ENGLISH)
1.0000 | Freq: Once | Status: AC
  Administered 2017-08-30: 1
  Filled 2017-08-30: qty 1

## 2017-08-30 MED ORDER — METFORMIN HCL 500 MG PO TABS
500.0000 mg | ORAL_TABLET | Freq: Two times a day (BID) | ORAL | Status: DC
  Administered 2017-08-30 – 2017-08-31 (×2): 500 mg via ORAL
  Filled 2017-08-30 (×2): qty 1

## 2017-08-30 NOTE — Discharge Summary (Signed)
Physician Discharge Summary  Kellie Shaffer:[id number] DOB: 1990/12/20 DOA: 08/27/2017  [facility]P: [facility], Dr. Everardo All  Admit date: 08/27/2017 Discharge date: 08/31/2017  Admitted From: Home Disposition: Home   Recommendations for Outpatient Follow-up:  1. Follow up with PCP to establish care at [facility] 1/25 2. Follow up with endocrinology as scheduled 1/22  Home Health: None Equipment/Devices: None Discharge Condition: Stable CODE STATUS: Full Diet recommendation: Carbohydrate limited  Brief/Interim Summary: Kellie Shaffer is a 27 y.o. female with a history of hyperthyroidism, ITP recently treated with prolonged steroids who presented to the ED for the 2nd time for hyperglycemia, found to be in DKA, admitted on insulin infusion 1/11. She has required significant insulin, so was kept in the SDU an additional day and transferred to the floor 1/13 on lantus/novolog. 24 hr requirement was ~100 units, so in preparation for discharge insulin switched to 70/30 45u BID. Glucose control has improved. Norvasc was replaced by lisinopril, and lipid panel indicates hyperlipidemia, though pt prefers to monitor off medications instituting dietary modifications at this time.   Discharge Diagnoses:  Principal Problem:   DKA (diabetic ketoacidoses) (HCC) Active Problems:   Hyperthyroidism  Hyperglycemia: With +FH of DM, history of other autoimmune diseases, I suspect steroids may have uncovered IDDM (not just caused transient hyperglycemia severe enough to cause DKA). HbA1c 11.5% (Avg CBG ~280mg /dl over 3 months). C-peptide at LLN, 1.1, indicating some insufficient insulin release in the face of severe hyperglycemia.  - CBGs improved without hypoglycemia on novolog 70/30 45u BID. Will continue this.  - Follow up with PCP next week.  - Carb-modified diet education provided.  HTN:  - Stop norvasc, start lisinopril  Hypokalemia: Resolved with replacement  ITP: Platelets now  wnl, having completed prednisone (x~2 months), followed by Dr. Arbutus Ped.  - Needs repeat CBC, CMP, and LDH in 3 months.   Hyperthyroidism: Followed by Dr. Everardo All. TSH 0.896 - Continue methimazole - Follow up with Dr. Everardo All 1/22.  Contact allergic dermatitis: Suspect due to detergent in the hospital. Resolved.  Tooth pain and facial swelling: no palpable abscess on exam, afebrile. Will not be able to get in to see dentist for months.  - Start clindamycin x 7 days, ibuprofen/tylenol prn pain. Urged to get dentist appt ASAP.  Discharge Instructions Discharge Instructions    Diet Carb Modified   Complete by:  As directed    Discharge instructions   Complete by:  As directed    You were admitted with a new diagnosis of diabetes which requires insulin for management. Your blood sugars have improved significantly and you are stable for discharge with the following recommendations:  - Start taking novolin 70/30 flexpen 45 units with breakfast and 45 units with dinner. If you have questions about how to use this, ask the pharmacist for detailed directions.  - Check and record your blood sugar in the morning and three times daily with meals - Stop taking norvasc and start taking lisinopril for blood pressure and to help protect your kidneys.  - Take metformin twice daily to help with diabetes - For the toothache, you will need to see a dentist. To prevent an abscess, take augmentin twice daily for 7 days. You may also take tylenol over the counter and/or ibuprofen 600mg  as needed for pain.  - Follow up with Dr. Everardo All and with your new PCP (see below) in the next week, or seek medical attention sooner if your symptoms return.   Increase activity slowly  Complete by:  As directed      Allergies as of 08/31/2017      Reactions   Benadryl [diphenhydramine Hcl] Anaphylaxis, Hives   Chloroxine Hives   Clorox      Medication List    STOP taking these medications   amLODipine 5 MG  tablet Commonly known as:  NORVASC     TAKE these medications   amoxicillin-clavulanate 875-125 MG tablet Commonly known as:  AUGMENTIN Take 1 tablet by mouth 2 (two) times daily.   Biotin 1610910000 MCG Tabs Take 1,000 mcg by mouth daily.   cetirizine 10 MG tablet Commonly known as:  ZYRTEC Take 10 mg by mouth daily as needed for allergies.   hydrochlorothiazide 25 MG tablet Commonly known as:  HYDRODIURIL Take 1 tablet (25 mg total) by mouth daily.   ibuprofen 600 MG tablet Commonly known as:  ADVIL,MOTRIN Take 1 tablet (600 mg total) by mouth every 6 (six) hours as needed (toothache).   Insulin Isophane & Regular Human (70-30) 100 UNIT/ML PEN Commonly known as:  NOVOLIN 70/30 FLEXPEN RELION Inject 45 Units into the skin 2 (two) times daily with a meal.   Insulin Pen Needle 31G X 5 MM Misc Inject insulin BIDWC   IRON PO Take 1 tablet by mouth daily.   lisinopril 5 MG tablet Commonly known as:  PRINIVIL,ZESTRIL Take 1 tablet (5 mg total) by mouth daily. Start taking on:  09/01/2017   metFORMIN 500 MG tablet Commonly known as:  GLUCOPHAGE Take 1 tablet (500 mg total) by mouth 2 (two) times daily with a meal.   methimazole 10 MG tablet Commonly known as:  TAPAZOLE Take 1 tablet (10 mg total) by mouth 2 (two) times daily.   metoprolol tartrate 25 MG tablet Commonly known as:  LOPRESSOR Take 0.5 tablets (12.5 mg total) by mouth every morning.      Follow-up Information    [facility]. Go on 09/10/2017.   Specialty:  Internal Medicine Why:  appointment at 9 am Contact information: [address] [place] [id number] [phone]         Allergies  Allergen Reactions  . Benadryl [Diphenhydramine Hcl] Anaphylaxis and Hives  . Chloroxine Hives    Clorox    Consultations:  Diabetes coordinator  Dietitian  Procedures/Studies: None  Subjective: Feeling well, though has developed right upper toothache. Thinks she's  been grinding her teeth in the hospital, now has pain improved with tylenol and feels her right cheek slightly swelling. No fever. Eating well, coming to grips with Dx DM  Discharge Exam: BP 127/70 (BP Location: Left Arm)   Pulse 71   Temp 98.4 F (36.9 C) (Oral)   Resp 16   Ht 5\' 9"  (1.753 m)   Wt 95.7 kg (211 lb)   LMP 08/13/2017   SpO2 99%   BMI 31.16 kg/m   General: Pt is alert, awake, not in acute distress HEENT: No palpable or visible abscess. Overall good dentition. Right face subtly swollen when compared to left without tongue or throat edema. Cardiovascular: RRR, S1/S2 +, no rubs, no gallops Respiratory: CTA bilaterally, no wheezing, no rhonchi Abdominal: Soft, NT, ND, bowel sounds + Extremities: No edema, no cyanosis  Labs: Basic Metabolic Panel: Recent Labs  Lab 08/28/17 1502 08/28/17 2033 08/29/17 0544 08/30/17 0532 08/31/17 0537  NA 133* 135 136 135 137  K 3.2* 3.2* 3.2* 3.0* 3.5  CL 107 110 111 106 106  CO2 19* 19* 18* 23  23  GLUCOSE 264* 137* 260* 257* 164*  BUN 13 14 12 6  <5*  CREATININE 0.62 0.60 0.53 0.48 0.34*  CALCIUM 9.2 8.8* 8.3* 8.1* 8.2*   Liver Function Tests: Recent Labs  Lab 08/27/17 1648  AST 14*  ALT 30  ALKPHOS 73  BILITOT 1.5*  PROT 8.2*  ALBUMIN 4.4   CBC: Recent Labs  Lab 08/27/17 1648  WBC 8.5  NEUTROABS 5.9  HGB 13.5  HCT 39.3  MCV 77.5*  PLT 330   CBG: Recent Labs  Lab 08/30/17 0726 08/30/17 1158 08/30/17 1653 08/30/17 2113 08/31/17 0721  GLUCAP 270* 252* 294* 206* 191*   Hgb A1c No results for input(s): HGBA1C in the last 72 hours. Thyroid function studies No results for input(s): TSH, T4TOTAL, T3FREE, THYROIDAB in the last 72 hours.  Invalid input(s): FREET3 Urinalysis    Component Value Date/Time   COLORURINE YELLOW 08/27/2017 1649   APPEARANCEUR CLEAR 08/27/2017 1649   LABSPEC 1.010 08/27/2017 1649   PHURINE 5.5 08/27/2017 1649   GLUCOSEU >=500 (A) 08/27/2017 1649   HGBUR NEGATIVE 08/27/2017  1649   BILIRUBINUR NEGATIVE 08/27/2017 1649   KETONESUR >80 (A) 08/27/2017 1649   PROTEINUR NEGATIVE 08/27/2017 1649   UROBILINOGEN 1.0 05/09/2015 2240   NITRITE NEGATIVE 08/27/2017 1649   LEUKOCYTESUR NEGATIVE 08/27/2017 1649    Microbiology Recent Results (from the past 240 hour(s))  MRSA PCR Screening     Status: None   Collection Time: 08/28/17  2:23 AM  Result Value Ref Range Status   MRSA by PCR NEGATIVE NEGATIVE Final    Comment:        The GeneXpert MRSA Assay (FDA approved for NASAL specimens only), is one component of a comprehensive MRSA colonization surveillance program. It is not intended to diagnose MRSA infection nor to guide or monitor treatment for MRSA infections.     Time coordinating discharge: Approximately 40 minutes  Kellie Junker, MD  [facility] 08/31/2017, 4:35 PM Pager [phone]

## 2017-08-30 NOTE — Progress Notes (Signed)
Spoke with patient at bedside. She has an appt with Dr. Everardo AllEllison on 1/22, he follows her for thyroid issues. [facility] also [facility]able [facility], this will allow her to get meds through the [facility] pharmacy. She has researched some of her meds and found [facility] to be cheaper for her. She feels she can afford her meds out of pocket. She has been checking her BS at home so has a meter and supplies, she is learning to give herself injections. Appt made for her on 1/25 for hospital f/u, appt placed in AVS. Anticipating d/c today. [phone]

## 2017-08-30 NOTE — Progress Notes (Signed)
Inpatient Diabetes Program Recommendations  AACE/ADA: New Consensus Statement on Inpatient Glycemic Control (2015)  Target Ranges:  Prepandial:   less than 140 mg/dL      Peak postprandial:   less than 180 mg/dL (1-2 hours)      Critically ill patients:  140 - 180 mg/dL   Lab Results  Component Value Date   GLUCAP 270 (H) 08/30/2017   HGBA1C 11.5 (H) 08/28/2017    Review of Glycemic Control Results for Kellie Shaffer, Kellie Shaffer (MRN [id number]) as of 08/30/2017 11:48  Ref. Range 08/29/2017 08:03 08/29/2017 11:52 08/29/2017 16:35 08/29/2017 20:51 08/30/2017 07:26  Glucose-Capillary Latest Ref Range: 65 - 99 mg/dL 562256 (H) 130358 (H) 865321 (H) 284 (H) 270 (H)    Diabetes history: New onset DM Outpatient Diabetes medications: Metformin 500 mg bid Current orders for Inpatient glycemic control:  Novolog resistant tid with meals and HS, Novolog 8 units tid with meals, Lantus 30 units bid  Inpatient Diabetes Program Recommendations:    Note plans for patient to transition to 70/30 today due to cost.  Discussed with MD.   Talked at length with patient regarding DM including types of DM (type 1 vs. Type 2), hyperglycemia, hypoglycemia, and diet.  Patient states that she has lost 25 pounds since November when she was told that her blood sugars were high.  She states that her last steroid dose was last week.  Patient has also been eating "no carbs" in hopes of helping her blood sugars.  We discussed CHO and the importance of glucose for energy for the body.  Patient was glad that she would "still be able to eat".  Will order dietician consult.  She has meter at home and knows how to check blood sugars.  We discussed insulin and she states that her roommate is a CNA and therefore has them to help her.  I told her that I want her to be comfortable self-administering insulin prior to d/c home.  Will discuss with RN.  Will re-visit patient this afternoon to answer further questions.  Plan is for her to stay overnight for  further insulin adjustments since blood sugars remain>200 mg/dL.   Thanks,  Beryl MeagerJenny Aquil Duhe, RN, BC-ADM Inpatient Diabetes Coordinator Pager [phone] (8a-5p)

## 2017-08-30 NOTE — Progress Notes (Signed)
[facility] PROGRESS NOTE  Kellie Shaffer  ONG:[id number] DOB: Oct 16, 1990 DOA: 08/27/2017 PCP: Patient, No Pcp Per Outpatient Specialists: Endocrinology, Everardo AllEllison Brief Narrative: Kellie Shaffer is a 27 y.o. female with a history of hyperthyroidism, ITP recently treated with prolonged steroids who presented to the ED for the 2nd time for hyperglycemia, found to be in DKA, admitted on insulin infusion 1/11. She has required significant insulin, so was kept in the SDU an additional day and transferred to the floor 1/13.    Subjective: Tearful about gravity of diagnosis, eager to manage it well, seems motivated. Has not had CBG < 250 all yesterday.   Objective: BP 128/66 (BP Location: Left Arm)   Pulse 64   Temp 98.2 F (36.8 C) (Oral)   Resp 15   Ht 5\' 9"  (1.753 m)   Wt 95.7 kg (211 lb)   LMP 08/13/2017   SpO2 100%   BMI 31.16 kg/m   Gen: Non-toxic 26yo F in no distress Neck: No palpable goiter/nodules, supple Pulm: Clear and nonlabored on room air  CV: RRR, no murmur, no JVD, no edema GI: Soft, NT, ND, +BS  Neuro: Alert and oriented. No focal deficits. Ext: Warm, no deformities Skin: No rashes or wounds or acanthosis.  Assessment & Plan: Principal Problem:   DKA (diabetic ketoacidoses) (HCC) Active Problems:   Hyperthyroidism  Hyperglycemia: With +FH of DM, history of other autoimmune diseases, I suspect steroids may have uncovered IDDM (not just caused transient hyperglycemia severe enough to cause DKA). HbA1c 11.5% (Avg CBG ~280mg /dl over 3 months). C-peptide at LLN, 1.1, indicating some insufficient insulin release in the face of severe hyperglycemia.  - Needed 50 units long acting yesterday and 57 of short acting. Will transition to 70/30 insulin which is what she will be on at discharge due to cost. Start 45 units for PM dose and monitor overnight. Hopeful for DC tmrw.  - Pt to self-administer insulin, please.  - Carb-modified diet. RD consulted.   HTN:  -  Stop norvasc, start lisinopril  Hypokalemia: Refractory to replacement.  - Repeat po 40mEq x2 today.  ITP: Platelets now wnl, having completed prednisone (x~2 months), followed by Dr. Arbutus PedMohamed.  - Needs repeat CBC, CMP, and LDH in 3 months.   Hyperthyroidism: Followed by Dr. Everardo AllEllison. TSH 0.896 - Continue methimazole - Follow up with Dr. Everardo AllEllison 1/22.  Contact allergic dermatitis: Suspect due to detergent in the hospital. Stable. - Zyrtec as ordered daily. Has severe allergy to benadryl and would prefer to avoid steroids, so urged her to bathe and put on her home clothes.   Kellie Junkeryan Kamree Wiens, MD [facility] Pager [phone]  If 7PM-7AM, please contact night-coverage [email or web address] Password [email or web address] 08/30/2017, 12:36 PM

## 2017-08-31 ENCOUNTER — Encounter (HOSPITAL_COMMUNITY): Payer: Self-pay | Admitting: Family Medicine

## 2017-08-31 LAB — LIPID PANEL
CHOL/HDL RATIO: 6.2 ratio
Cholesterol: 216 mg/dL — ABNORMAL HIGH (ref 0–200)
HDL: 35 mg/dL — ABNORMAL LOW (ref >40)
LDL Cholesterol: 168 mg/dL — ABNORMAL HIGH (ref 0–99)
Triglycerides: 67 mg/dL (ref <150)
VLDL: 13 mg/dL (ref 0–40)

## 2017-08-31 LAB — BASIC METABOLIC PANEL
ANION GAP: 8 (ref 5–15)
BUN: <5 mg/dL — ABNORMAL LOW (ref 6–20)
CO2: 23 mmol/L (ref 22–32)
Calcium: 8.2 mg/dL — ABNORMAL LOW (ref 8.9–10.3)
Chloride: 106 mmol/L (ref 101–111)
Creatinine, Ser: 0.34 mg/dL — ABNORMAL LOW (ref 0.44–1.00)
GFR calc Af Amer: >60 mL/min (ref >60)
GLUCOSE: 164 mg/dL — AB (ref 65–99)
POTASSIUM: 3.5 mmol/L (ref 3.5–5.1)
Sodium: 137 mmol/L (ref 135–145)

## 2017-08-31 LAB — GLUCOSE, CAPILLARY: GLUCOSE-CAPILLARY: 191 mg/dL — AB (ref 65–99)

## 2017-08-31 MED ORDER — LISINOPRIL 5 MG PO TABS: 5.0000 mg | ORAL_TABLET | Freq: Every day | ORAL | 0 refills | Status: DC

## 2017-08-31 MED ORDER — IBUPROFEN 400 MG PO TABS: 600.0000 mg | ORAL_TABLET | Freq: Four times a day (QID) | ORAL | Status: DC | PRN

## 2017-08-31 MED ORDER — IBUPROFEN 600 MG PO TABS: 600.0000 mg | ORAL_TABLET | Freq: Four times a day (QID) | ORAL | 0 refills | Status: DC | PRN

## 2017-08-31 MED ORDER — INSULIN PEN NEEDLE 31G X 5 MM MISC: 0 refills | Status: DC

## 2017-08-31 MED ORDER — INSULIN ISOPHANE & REGULAR (HUMAN 70-30)100 UNIT/ML KWIKPEN: 45.0000 [IU] | PEN_INJECTOR | Freq: Two times a day (BID) | SUBCUTANEOUS | 0 refills | Status: DC

## 2017-08-31 MED ORDER — METFORMIN HCL 500 MG PO TABS: 500.0000 mg | ORAL_TABLET | Freq: Two times a day (BID) | ORAL | 0 refills | Status: DC

## 2017-08-31 MED ORDER — AMOXICILLIN-POT CLAVULANATE 875-125 MG PO TABS: 1.0000 | ORAL_TABLET | Freq: Two times a day (BID) | ORAL | 0 refills | Status: DC

## 2017-08-31 NOTE — Progress Notes (Signed)
NUTRITION NOTE  RD consulted for nutrition education regarding diabetes.   Lab Results  Component Value Date   HGBA1C 11.5 (H) 08/28/2017    RD provided "Carbohydrate Counting for People with Diabetes" AND "Label Reading Tips for Diabetes" handouts from the Academy of Nutrition and Dietetics. Discussed different food groups and their effects on blood sugar, emphasizing carbohydrate-containing foods. Provided list of carbohydrates and recommended serving sizes of common foods.  Discussed importance of controlled and consistent carbohydrate intake throughout the day. Provided examples of ways to balance meals/snacks and encouraged intake of high-fiber, whole grain complex carbohydrates. Teach back method used.  Patient with a good understanding of the term "carbohydrate" and was able to give many examples of carb-containing foods. She had a good understanding of why it is important to spread the intake of carbs out throughout the day. Patient denied any questions/concerns related to diet at this time and states "this won't be bad." Informed pt that she does not need to eliminate any foods, but it is important to monitor portion control and make "swaps" when warranted as to not go over carb limits for meals (45-60 grams/meal) or snacks (15 rams/snack). Pt usually eats one larger meal per day and then eats several smaller meals or snacks throughout the day.  This RD had talked with Dr. Jarvis NewcomerGrunz earlier today and he was able to put in consult for NDES; made pt aware of this and pt is appreciative.   Expect good compliance. Body mass index is 31.16 kg/m. Pt meets criteria for obesity based on current BMI.  Current diet order is Carb Modified, patient is consuming approximately 100% of meals at this time. Labs and medications reviewed. No further nutrition interventions warranted at this time. RD contact information provided. If additional nutrition issues arise, please re-consult RD.      Trenton GammonJessica  Juliah Scadden, MS, RD, LDN, [facility] Inpatient Clinical Dietitian Pager # [phone] After hours/weekend pager # [phone]

## 2017-08-31 NOTE — Progress Notes (Signed)
Inpatient Diabetes Program Recommendations  AACE/ADA: New Consensus Statement on Inpatient Glycemic Control (2015)  Target Ranges:  Prepandial:   less than 140 mg/dL      Peak postprandial:   less than 180 mg/dL (1-2 hours)      Critically ill patients:  140 - 180 mg/dL   Lab Results  Component Value Date   GLUCAP 191 (H) 08/31/2017   HGBA1C 11.5 (H) 08/28/2017    Review of Glycemic ControlResults for Kellie Shaffer, Kellie M (MRN [id number]) as of 08/31/2017 10:07  Ref. Range 08/30/2017 07:26 08/30/2017 11:58 08/30/2017 16:53 08/30/2017 21:13 08/31/2017 07:21  Glucose-Capillary Latest Ref Range: 65 - 99 mg/dL 409270 (H) 811252 (H) 914294 (H) 206 (H) 191 (H)    Diabetes history: New onset DM Current orders for Inpatient glycemic control:  Novolog 70/30 45 units bid  Inpatient Diabetes Program Recommendations:    Reiterated survival skills of DM today with teach back.  Patient was able to discuss hypoglycemia, treatment, complications, monitoring, and when to call MD.  She had not given insulin injection yet although she told her RN that she had done it before.  Therefore I demonstrated insulin injection using vial of NS.  Patient injected 10 units of air, drew up normal saline, cleaned site and injected 10 units of normal saline. She was very nervous and tearful noting that she has a fear of needles.  However after injection she stated "that was not bad and I can do this".  Had patient watch insulin videos on patient education network as well.  Still has not seen dietician.  Would benefit from this as well.   Discussed with RN.   Thanks,  Beryl MeagerJenny Jayvyn Haselton, RN, BC-ADM Inpatient Diabetes Coordinator Pager [phone] (8a-5p)

## 2017-09-07 ENCOUNTER — Encounter: Payer: Self-pay | Admitting: Endocrinology

## 2017-09-07 ENCOUNTER — Ambulatory Visit: Payer: Self-pay | Admitting: Endocrinology

## 2017-09-07 VITALS — BP 111/72 | HR 75 | Wt 223.6 lb

## 2017-09-07 DIAGNOSIS — E0590 Thyrotoxicosis, unspecified without thyrotoxic crisis or storm: Secondary | ICD-10-CM

## 2017-09-07 NOTE — Patient Instructions (Addendum)
Please continue the same methimazole. It is best to never miss it. However, if you do miss a pill, take 2 the next time.  Please come back for a follow-up appointment in 4 months.  If ever you have fever while taking methimazole, stop it and call us, even if the reason is obvious, because of the risk of a rare side-effect.

## 2017-09-07 NOTE — Progress Notes (Signed)
Subjective:    Patient ID: Kellie Shaffer, female    DOB: 07-19-1991, 27 y.o.   MRN: [id number]  HPI Pt returns for f/u of hyperthyroidism (dx'ed 2013, during a pregnancy; US showed a small multinodular goiter, but she also has proptosis; she was then rx'ed with tapazole; she was lost to f/u, but hyperthyroidism was found again during a hospitalization in late 2015 for appendicitis; she was restarted on tapazole then; pt says she is not at risk for another pregnancy; f/u TFT in 2018 were normal off rx; later in 2018, it was resumed due to suppressed TSH).   She feels better, since recent hospitalization.  She says she sometimes misses the methimazole. Past Medical History:  Diagnosis Date  . Abortion May 2013  . Anemia   . Atrial fibrillation (HCC)   . Hypercholesteremia   . Hypertension   . Hyperthyroidism   . Iron deficiency anemia 09/04/2016  . Ovarian cyst     Past Surgical History:  Procedure Laterality Date  . LAPAROSCOPIC APPENDECTOMY N/A 01/14/2017   Procedure: APPENDECTOMY LAPAROSCOPIC;  Surgeon: Claud Kelp, MD;  Location: [facility];  Service: General;  Laterality: N/A;    Social History   Socioeconomic History  . Marital status: Single    Spouse name: Not on file  . Number of children: Not on file  . Years of education: 81  . Highest education level: Not on file  Social Needs  . Financial resource strain: Not on file  . Food insecurity - worry: Not on file  . Food insecurity - inability: Not on file  . Transportation needs - medical: Not on file  . Transportation needs - non-medical: Not on file  Occupational History  . Occupation: [employer]  Tobacco Use  . Smoking status: Current Every Day Smoker    Packs/day: 0.25    Types: Cigarettes  . Smokeless tobacco: Never Used  Substance and Sexual Activity  . Alcohol use: Yes    Alcohol/week: 0.0 oz    Comment: 2-3 times a month   . Drug use: No  . Sexual activity: Yes    Birth control/protection:  Condom  Other Topics Concern  . Not on file  Social History Narrative   Regular exercise-yes   Caffeine Use-yes          Current Outpatient Medications on File Prior to Visit  Medication Sig Dispense Refill  . Biotin 25956 MCG TABS Take 1,000 mcg by mouth daily.     . cetirizine (ZYRTEC) 10 MG tablet Take 10 mg by mouth daily as needed for allergies.     . hydrochlorothiazide (HYDRODIURIL) 25 MG tablet Take 1 tablet (25 mg total) by mouth daily. 30 tablet 1  . Insulin Isophane & Regular Human (NOVOLIN 70/30 FLEXPEN RELION) (70-30) 100 UNIT/ML PEN Inject 45 Units into the skin 2 (two) times daily with a meal. 30 mL 0  . Insulin Pen Needle 31G X 5 MM MISC Inject insulin BIDWC 90 each 0  . IRON PO Take 1 tablet by mouth daily.     Marland Kitchen lisinopril (PRINIVIL,ZESTRIL) 5 MG tablet Take 1 tablet (5 mg total) by mouth daily. 30 tablet 0  . metFORMIN (GLUCOPHAGE) 500 MG tablet Take 1 tablet (500 mg total) by mouth 2 (two) times daily with a meal. 60 tablet 0  . methimazole (TAPAZOLE) 10 MG tablet Take 1 tablet (10 mg total) by mouth 2 (two) times daily. 60 tablet 5  . metoprolol tartrate (LOPRESSOR) 25 MG tablet Take  0.5 tablets (12.5 mg total) by mouth every morning. 15 tablet 5  . POTASSIUM PO Take by mouth daily.     No current facility-administered medications on file prior to visit.     Allergies  Allergen Reactions  . Benadryl [Diphenhydramine Hcl] Anaphylaxis and Hives  . Chloroxine Hives    Clorox    Family History  Problem Relation Age of Onset  . Diabetes Other        Parent  . Cancer Other        Breast Cancer-Parent  . Hypertension Other        Parent  . Hyperlipidemia Other        Parent  . Arthritis Other        Parent    BP 111/72 (BP Location: Left Arm, Patient Position: Sitting, Cuff Size: Normal)   Pulse 75   Wt 223 lb 9.6 oz (101.4 kg)   LMP 08/13/2017   SpO2 98%   BMI 33.02 kg/m   Review of Systems Denies fever.     Objective:   Physical Exam VITAL  SIGNS:  See vs page GENERAL: no distress eyes: no periorbital swelling, but there is bilat proptosis  NECK: There is no palpable thyroid enlargement.  No thyroid nodule is palpable.  No palpable lymphadenopathy at the anterior neck.    Lab Results  Component Value Date   TSH 0.896 08/28/2017      Assessment & Plan:  Hyperthyroidism: well-controlled. 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 It is best to never miss it. However, if you do miss a pill, take 2 the next time.  Please come back for a follow-up appointment in 4 months.  If ever you have fever while taking methimazole, stop it and call us, even if the reason is obvious, because of the risk of a rare side-effect.

## 2017-09-08 ENCOUNTER — Other Ambulatory Visit: Payer: Self-pay | Admitting: Endocrinology

## 2017-09-10 ENCOUNTER — Ambulatory Visit (INDEPENDENT_AMBULATORY_CARE_PROVIDER_SITE_OTHER): Payer: Self-pay | Admitting: Family Medicine

## 2017-09-10 ENCOUNTER — Encounter: Payer: Self-pay | Admitting: Family Medicine

## 2017-09-10 VITALS — BP 128/71 | HR 79 | Temp 97.9°F | Resp 18 | Ht 69.0 in | Wt 223.8 lb

## 2017-09-10 DIAGNOSIS — E119 Type 2 diabetes mellitus without complications: Secondary | ICD-10-CM

## 2017-09-10 DIAGNOSIS — H539 Unspecified visual disturbance: Secondary | ICD-10-CM

## 2017-09-10 DIAGNOSIS — E0590 Thyrotoxicosis, unspecified without thyrotoxic crisis or storm: Secondary | ICD-10-CM

## 2017-09-10 DIAGNOSIS — D693 Immune thrombocytopenic purpura: Secondary | ICD-10-CM

## 2017-09-10 LAB — GLUCOSE, CAPILLARY: GLUCOSE-CAPILLARY: 76 mg/dL (ref 65–99)

## 2017-09-10 LAB — POCT URINALYSIS DIP (DEVICE)
BILIRUBIN URINE: NEGATIVE
Glucose, UA: NEGATIVE mg/dL
Hgb urine dipstick: NEGATIVE
Ketones, ur: NEGATIVE mg/dL
LEUKOCYTES UA: NEGATIVE
NITRITE: NEGATIVE
Protein, ur: NEGATIVE mg/dL
Specific Gravity, Urine: 1.02 (ref 1.005–1.030)
Urobilinogen, UA: 0.2 mg/dL (ref 0.0–1.0)
pH: 6 (ref 5.0–8.0)

## 2017-09-10 MED ORDER — METOPROLOL TARTRATE 25 MG PO TABS: 12.5000 mg | ORAL_TABLET | ORAL | 1 refills | Status: DC

## 2017-09-10 MED ORDER — HYDROCHLOROTHIAZIDE 25 MG PO TABS: 25.0000 mg | ORAL_TABLET | Freq: Every day | ORAL | 1 refills | Status: DC

## 2017-09-10 MED ORDER — INSULIN ISOPHANE & REGULAR (HUMAN 70-30)100 UNIT/ML KWIKPEN: 45.0000 [IU] | PEN_INJECTOR | Freq: Two times a day (BID) | SUBCUTANEOUS | 3 refills | Status: DC

## 2017-09-10 MED ORDER — METFORMIN HCL 500 MG PO TABS: 500.0000 mg | ORAL_TABLET | Freq: Two times a day (BID) | ORAL | 1 refills | Status: DC

## 2017-09-10 MED ORDER — CETIRIZINE HCL 10 MG PO TABS: 10.0000 mg | ORAL_TABLET | Freq: Every day | ORAL | 1 refills | Status: DC | PRN

## 2017-09-10 MED ORDER — LISINOPRIL 5 MG PO TABS: 5.0000 mg | ORAL_TABLET | Freq: Every day | ORAL | 1 refills | Status: DC

## 2017-09-10 MED ORDER — INSULIN PEN NEEDLE 31G X 5 MM MISC: 0 refills | Status: DC

## 2017-09-10 MED ORDER — ATORVASTATIN CALCIUM 40 MG PO TABS: 40.0000 mg | ORAL_TABLET | Freq: Every day | ORAL | 3 refills | Status: DC

## 2017-09-10 MED ORDER — METHIMAZOLE 10 MG PO TABS: 10.0000 mg | ORAL_TABLET | Freq: Two times a day (BID) | ORAL | 5 refills | Status: DC

## 2017-09-10 MED ORDER — INSULIN ISOPHANE & REGULAR (HUMAN 70-30)100 UNIT/ML KWIKPEN: 35.0000 [IU] | PEN_INJECTOR | Freq: Two times a day (BID) | SUBCUTANEOUS | 3 refills | Status: DC

## 2017-09-10 MED FILL — !HUMULIN 70/30 KWIKPEN: (70-30) 100 | 30 days supply | Qty: 21 | Fill #0

## 2017-09-10 MED FILL — methIMAzole 10 MG TABS: 10 | 30 days supply | Qty: 60 | Fill #0

## 2017-09-10 MED FILL — ?CETIRIZINE HCL 10 MG TABLE: 10 | 30 days supply | Qty: 30 | Fill #0

## 2017-09-10 MED FILL — ?METFORMIN HCL 500MG TABLET: 500 | 30 days supply | Qty: 60 | Fill #0

## 2017-09-10 MED FILL — ?ATORVASTATIN 40MG TABLET: 40 | 30 days supply | Qty: 30 | Fill #0

## 2017-09-10 MED FILL — HYDROCHLOROTHIAZIDE 25 MG T: 25 | 30 days supply | Qty: 30 | Fill #0

## 2017-09-10 MED FILL — LISINOPRIL 5 MG TABLET: 5 | 30 days supply | Qty: 30 | Fill #0

## 2017-09-10 MED FILL — METOPROLOL TARTRATE 25 MG T: 25 | 30 days supply | Qty: 15 | Fill #0

## 2017-09-10 NOTE — Patient Instructions (Addendum)
Continue your current insulin regimen however I am reducing your units to 35 units twice per day for now. I will recheck your hemoglobin A1C in 4 weeks.  Return in 4 weeks for PAP and diabetes follow-up.   I am referring you for a Diabetic Eye Exam.   Diabetes Mellitus and Nutrition When you have diabetes (diabetes mellitus), it is very important to have healthy eating habits because your blood sugar (glucose) levels are greatly affected by what you eat and drink. Eating healthy foods in the appropriate amounts, at about the same times every day, can help you:  Control your blood glucose.  Lower your risk of heart disease.  Improve your blood pressure.  Reach or maintain a healthy weight.  Every person with diabetes is different, and each person has different needs for a meal plan. Your health care provider may recommend that you work with a diet and nutrition specialist (dietitian) to make a meal plan that is best for you. Your meal plan may vary depending on factors such as:  The calories you need.  The medicines you take.  Your weight.  Your blood glucose, blood pressure, and cholesterol levels.  Your activity level.  Other health conditions you have, such as heart or kidney disease.  How do carbohydrates affect me? Carbohydrates affect your blood glucose level more than any other type of food. Eating carbohydrates naturally increases the amount of glucose in your blood. Carbohydrate counting is a method for keeping track of how many carbohydrates you eat. Counting carbohydrates is important to keep your blood glucose at a healthy level, especially if you use insulin or take certain oral diabetes medicines. It is important to know how many carbohydrates you can safely have in each meal. This is different for every person. Your dietitian can help you calculate how many carbohydrates you should have at each meal and for snack. Foods that contain carbohydrates include:  Bread,  cereal, rice, pasta, and crackers.  Potatoes and corn.  Peas, beans, and lentils.  Milk and yogurt.  Fruit and juice.  Desserts, such as cakes, cookies, ice cream, and candy.  How does alcohol affect me? Alcohol can cause a sudden decrease in blood glucose (hypoglycemia), especially if you use insulin or take certain oral diabetes medicines. Hypoglycemia can be a life-threatening condition. Symptoms of hypoglycemia (sleepiness, dizziness, and confusion) are similar to symptoms of having too much alcohol. If your health care provider says that alcohol is safe for you, follow these guidelines:  Limit alcohol intake to no more than 1 drink per day for nonpregnant women and 2 drinks per day for men. One drink equals 12 oz of beer, 5 oz of wine, or 1 oz of hard liquor.  Do not drink on an empty stomach.  Keep yourself hydrated with water, diet soda, or unsweetened iced tea.  Keep in mind that regular soda, juice, and other mixers may contain a lot of sugar and must be counted as carbohydrates.  What are tips for following this plan? Reading food labels  Start by checking the serving size on the label. The amount of calories, carbohydrates, fats, and other nutrients listed on the label are based on one serving of the food. Many foods contain more than one serving per package.  Check the total grams (g) of carbohydrates in one serving. You can calculate the number of servings of carbohydrates in one serving by dividing the total carbohydrates by 15. For example, if a food has 30 g of  total carbohydrates, it would be equal to 2 servings of carbohydrates.  Check the number of grams (g) of saturated and trans fats in one serving. Choose foods that have low or no amount of these fats.  Check the number of milligrams (mg) of sodium in one serving. Most people should limit total sodium intake to less than 2,300 mg per day.  Always check the nutrition information of foods labeled as "low-fat"  or "nonfat". These foods may be higher in added sugar or refined carbohydrates and should be avoided.  Talk to your dietitian to identify your daily goals for nutrients listed on the label. Shopping  Avoid buying canned, premade, or processed foods. These foods tend to be high in fat, sodium, and added sugar.  Shop around the outside edge of the grocery store. This includes fresh fruits and vegetables, bulk grains, fresh meats, and fresh dairy. Cooking  Use low-heat cooking methods, such as baking, instead of high-heat cooking methods like deep frying.  Cook using healthy oils, such as olive, canola, or sunflower oil.  Avoid cooking with butter, cream, or high-fat meats. Meal planning  Eat meals and snacks regularly, preferably at the same times every day. Avoid going long periods of time without eating.  Eat foods high in fiber, such as fresh fruits, vegetables, beans, and whole grains. Talk to your dietitian about how many servings of carbohydrates you can eat at each meal.  Eat 4-6 ounces of lean protein each day, such as lean meat, chicken, fish, eggs, or tofu. 1 ounce is equal to 1 ounce of meat, chicken, or fish, 1 egg, or 1/4 cup of tofu.  Eat some foods each day that contain healthy fats, such as avocado, nuts, seeds, and fish. Lifestyle   Check your blood glucose regularly.  Exercise at least 30 minutes 5 or more days each week, or as told by your health care provider.  Take medicines as told by your health care provider.  Do not use any products that contain nicotine or tobacco, such as cigarettes and e-cigarettes. If you need help quitting, ask your health care provider.  Work with a Veterinary surgeoncounselor or diabetes educator to identify strategies to manage stress and any emotional and social challenges. What are some questions to ask my health care provider?  Do I need to meet with a diabetes educator?  Do I need to meet with a dietitian?  What number can I call if I have  questions?  When are the best times to check my blood glucose? Where to find more information:  American Diabetes Association: diabetes.org/food-and-fitness/food  Academy of Nutrition and Dietetics: https://www.vargas.com/www.eatright.org/resources/health/diseases-and-conditions/diabetes  [employer] of Diabetes and Digestive and Kidney Diseases (NIH): FindJewelers.czwww.niddk.nih.gov/health-information/diabetes/overview/diet-eating-physical-activity Summary  A healthy meal plan will help you control your blood glucose and maintain a healthy lifestyle.  Working with a diet and nutrition specialist (dietitian) can help you make a meal plan that is best for you.  Keep in mind that carbohydrates and alcohol have immediate effects on your blood glucose levels. It is important to count carbohydrates and to use alcohol carefully. This information is not intended to replace advice given to you by your health care provider. Make sure you discuss any questions you have with your health care provider. Document Released: 04/30/2005 Document Revised: 09/07/2016 Document Reviewed: 09/07/2016 Elsevier Interactive Patient Education  [employer]2018 [employer]

## 2017-09-10 NOTE — Progress Notes (Signed)
Patient ID: Kellie Shaffer, female    DOB: Dec 25, 1990, 27 y.o.   MRN: [id number]  PCP: Bing Neighbors, FNP  Chief Complaint  Patient presents with  . New Patient (Initial Visit)    Subjective:  HPI Kellie Shaffer is a 27 y.o. female with thrombocytopenia,  type 2 diabetes diabetes new diagnosis, atrial flutter, hx abnormal Pap results, and hyperthyroidism (to be Graves' disease).  Patient was treated by hematology for thrombocytopenia back in November with a 4-5-week course of prednisone treatment thrombocytopenia has resolved.  Patient suffered from episodic hyperglycemia, presented to the ED 08/13/2017 and was started on metformin 500 mg 2 times daily.  She subsequently re-presented to [facility] ED on 08/27/2017 was found to be experiencing DKA for which she was admitted to inpatient services.  Upon admission lab values which were abnormal include : A1c was 11.5., glucose was 129 on arrival, creatinine mildly elevated 1.18, potassium 3.3, and gap 24.  She was started on Novolin 70/30 45 units twice daily with significant improvement of hyperglycemia.  Suspected that the long course of prednisone likely revealed an underlying insulin-dependent diabetes.  Today she reports recent episodes of low blood sugar.  She is currently continuing 45 units twice daily of Novolin as well as metformin 500 mg 2 times daily.  She is currently on lisinopril for renal protection.  Has remained negative of proteinuria.  No recent eye exam.  Patient is currently followed by endocrinology for management of hyperthyroidism.  She is currently taking methimazole for treatment of hyperthyroidism. Last endocrinology follow-up was 09/07/17.  Present Kellie Shaffer denies shortness of breath, chest pain, chest tightness, shortness of breath, new weakness, headache, or dizziness. Social History   Socioeconomic History  . Marital status: Single    Spouse name: Not on file  . Number of children: Not on file  . Years  of education: 61  . Highest education level: Not on file  Social Needs  . Financial resource strain: Not on file  . Food insecurity - worry: Not on file  . Food insecurity - inability: Not on file  . Transportation needs - medical: Not on file  . Transportation needs - non-medical: Not on file  Occupational History  . Occupation: [employer]  Tobacco Use  . Smoking status: Current Every Day Smoker    Packs/day: 0.25    Types: Cigarettes  . Smokeless tobacco: Never Used  Substance and Sexual Activity  . Alcohol use: Yes    Alcohol/week: 0.0 oz    Comment: 2-3 times a month   . Drug use: No  . Sexual activity: Yes    Birth control/protection: Condom  Other Topics Concern  . Not on file  Social History Narrative   Regular exercise-yes   Caffeine Use-yes          Family History  Problem Relation Age of Onset  . Diabetes Other        Parent  . Cancer Other        Breast Cancer-Parent  . Hypertension Other        Parent  . Hyperlipidemia Other        Parent  . Arthritis Other        Parent   Review of Systems  Constitutional: Positive for fatigue.  HENT: Positive for dental problem.   Eyes: Positive for visual disturbance.  Respiratory: Negative.   Cardiovascular: Negative.   Gastrointestinal: Negative.   Endocrine: Negative.   Genitourinary: Negative.   Allergic/Immunologic: Negative.  Hematological: Negative.   Psychiatric/Behavioral: The patient is nervous/anxious.        Anxious regarding overall health and new diagnoses.    Patient Active Problem List   Diagnosis Date Noted  . DKA (diabetic ketoacidoses) (HCC) 08/27/2017  . Hyperglycemia 08/13/2017  . Oral candidiasis 08/13/2017  . Hypokalemia 07/19/2017  . Acute appendicitis 01/14/2017  . Iron deficiency anemia 09/04/2016  . Atrial flutter (HCC) 07/05/2014  . Hyperthyroidism 07/05/2014  . Thrombocytopenia (HCC) 05/27/2014  . Acute appendicitis, uncomplicated 05/26/2014  . Hypercholesteremia      Allergies  Allergen Reactions  . Benadryl [Diphenhydramine Hcl] Anaphylaxis and Hives  . Chloroxine Hives    Clorox    Prior to Admission medications   Medication Sig Start Date End Date Taking? Authorizing Provider  Biotin 40981 MCG TABS Take 1,000 mcg by mouth daily.    Yes [provider]  cetirizine (ZYRTEC) 10 MG tablet Take 10 mg by mouth daily as needed for allergies.    Yes [provider]  hydrochlorothiazide (HYDRODIURIL) 25 MG tablet TAKE ONE TABLET BY MOUTH ONE TIME DAILY  09/09/17  Yes Romero Belling, MD  Insulin Isophane & Regular Human (NOVOLIN 70/30 FLEXPEN RELION) (70-30) 100 UNIT/ML PEN Inject 45 Units into the skin 2 (two) times daily with a meal. 08/31/17  Yes Tyrone Nine, MD  Insulin Pen Needle 31G X 5 MM MISC Inject insulin BIDWC 08/31/17  Yes Tyrone Nine, MD  IRON PO Take 1 tablet by mouth daily.    Yes [provider]  lisinopril (PRINIVIL,ZESTRIL) 5 MG tablet Take 1 tablet (5 mg total) by mouth daily. 09/01/17  Yes Tyrone Nine, MD  metFORMIN (GLUCOPHAGE) 500 MG tablet Take 1 tablet (500 mg total) by mouth 2 (two) times daily with a meal. 08/31/17  Yes Tyrone Nine, MD  methimazole (TAPAZOLE) 10 MG tablet Take 1 tablet (10 mg total) by mouth 2 (two) times daily. 03/28/17  Yes Antony Madura, PA-C  metoprolol tartrate (LOPRESSOR) 25 MG tablet Take 0.5 tablets (12.5 mg total) by mouth every morning. 07/07/17  Yes Romero Belling, MD  POTASSIUM PO Take by mouth daily.   Yes [provider]    Past Medical, Surgical Family and Social History reviewed and updated.    Objective:   Today's Vitals   09/10/17 0934  BP: 128/71  Pulse: 79  Resp: 18  Temp: 97.9 F (36.6 C)  SpO2: 100%  Weight: 223 lb 12.8 oz (101.5 kg)  Height: 5\' 9"  (1.753 m)  PainSc: 0-No pain    Wt Readings from Last 3 Encounters:  09/10/17 223 lb 12.8 oz (101.5 kg)  09/07/17 223 lb 9.6 oz (101.4 kg)  08/27/17 211 lb (95.7 kg)   Physical Exam   Constitutional: She is oriented to person, place, and time. She appears well-developed and well-nourished.  HENT:  Head: Normocephalic and atraumatic.  Right Ear: External ear normal.  Left Ear: External ear normal.  Mouth/Throat: Oropharynx is clear and moist.  Eyes: Conjunctivae and EOM are normal. Pupils are equal, round, and reactive to light.  Ptosis noted  Neck: Normal range of motion. Neck supple. No thyromegaly present.  Cardiovascular: Normal rate, regular rhythm, normal heart sounds and intact distal pulses.  Pulmonary/Chest: Effort normal and breath sounds normal.  Abdominal: Soft. Bowel sounds are normal.  Musculoskeletal: Normal range of motion.  Lymphadenopathy:    She has no cervical adenopathy.  Neurological: She is alert and oriented to person, place, and time.  Skin: Skin  is warm and dry.  Psychiatric: She has a normal mood and affect. Her behavior is normal. Judgment and thought content normal.   Diabetic Foot Exam - Simple   Simple Foot Form  09/10/2017 10:34 AM  Visual Inspection Sensation Testing Pulse Check Comments: Foot exam grossly normal today.    Assessment & Plan:  1. Type 2 diabetes mellitus without complication, without long-term current use of insulin (HCC), A1c 11.5.  She reports some episodes of hypoglycemia at current dose of Novolin 45 units twice daily.  I will reduce units to 35 units twice daily with meals.  Encouraged patient she must eat meals with insulin administration.  Encouraged consistent daily monitoring of blood sugar and logging blood sugar readings.  Foot exam grossly normal today.  We will obtain a urine microalbumin, CMP, and refer to ophthalmology for diabetic eye exam.  2. Hyperthyroidism, currently followed and managed by endocrinology.  At present she is prescribed methimazole and reports tolerating medication.  She should continue continue close follow-up with her neurologist Dr. Romero BellingSean Ellison MD.  3. Chronic ITP (idiopathic  thrombocytopenia) [facility](HCC) previously managed by hematology although condition has since resolved.  CBC with platelets to ensure blood count remains within normal limits.  4. Vision disturbance, and suffers from proptosis secondary to Graves' disease and recently diagnosed with insulin-dependent diabetes.[facility].   Patient provided a [place] financial assistance application to complete to cover expenses to be referred to specialty.  Meds ordered this encounter  Medications  . metoprolol tartrate (LOPRESSOR) 25 MG tablet    Sig: Take 0.5 tablets (12.5 mg total) by mouth every morning.    Dispense:  90 tablet    Refill:  1    Order Specific Question:   Supervising Provider    Answer:   Quentin AngstJEGEDE, OLUGBEMIGA E [id number]]  . methimazole (TAPAZOLE) 10 MG tablet    Sig: Take 1 tablet (10 mg total) by mouth 2 (two) times daily.    Dispense:  180 tablet    Refill:  5    Order Specific Question:   Supervising Provider    Answer:   Quentin AngstJEGEDE, OLUGBEMIGA E [id number]]  . metFORMIN (GLUCOPHAGE) 500 MG tablet    Sig: Take 1 tablet (500 mg total) by mouth 2 (two) times daily with a meal.    Dispense:  180 tablet    Refill:  1    Patient to p/u when needed    Order Specific Question:   Supervising Provider    Answer:   Quentin AngstJEGEDE, OLUGBEMIGA E [[id number]]  . lisinopril (PRINIVIL,ZESTRIL) 5 MG tablet    Sig: Take 1 tablet (5 mg total) by mouth daily.    Dispense:  90 tablet    Refill:  1    Patient will p/u when needed    Order Specific Question:   Supervising Provider    Answer:   Quentin AngstJEGEDE, OLUGBEMIGA E [id number]]  . Insulin Pen Needle 31G X 5 MM MISC    Sig: Inject insulin BIDWC    Dispense:  90 each    Refill:  0    Order Specific Question:   Supervising Provider    Answer:   Quentin AngstJEGEDE, OLUGBEMIGA E [id number]]  . DISCONTD: Insulin Isophane & Regular Human (NOVOLIN 70/30 FLEXPEN RELION) (70-30) 100 UNIT/ML PEN    Sig: Inject 45 Units into the skin 2 (two) times  daily with a meal.    Dispense:  30 mL    Refill:  3  Pt will pick up when needed    Order Specific Question:   Supervising Provider    Answer:   Quentin Angst [[id number]]  . hydrochlorothiazide (HYDRODIURIL) 25 MG tablet    Sig: Take 1 tablet (25 mg total) by mouth daily.    Dispense:  90 tablet    Refill:  1    Order Specific Question:   Supervising Provider    Answer:   Quentin Angst [id number]  . cetirizine (ZYRTEC) 10 MG tablet    Sig: Take 1 tablet (10 mg total) by mouth daily as needed for allergies.    Dispense:  90 tablet    Refill:  1    Order Specific Question:   Supervising Provider    Answer:   Quentin Angst [id number]  . atorvastatin (LIPITOR) 40 MG tablet    Sig: Take 1 tablet (40 mg total) by mouth daily.    Dispense:  90 tablet    Refill:  3    Order Specific Question:   Supervising Provider    Answer:   Quentin Angst [id number]  . Insulin Isophane & Regular Human (NOVOLIN 70/30 FLEXPEN RELION) (70-30) 100 UNIT/ML PEN    Sig: Inject 35 Units into the skin 2 (two) times daily with a meal.    Dispense:  30 mL    Refill:  3    Pt will pick up when needed    Order Specific Question:   Supervising Provider    Answer:   Quentin Angst [[id number]]    Orders Placed This Encounter  Procedures  . Microalbumin, urine  . CBC with Differential  . Comprehensive metabolic panel  . Glucose, capillary  . Ambulatory referral to Ophthalmology  . POCT urinalysis dip (device)  . HM Diabetes Foot Exam    Return for follow-up in 4 weeks for complete Pap, Recheck A1c, and chronic condition management.  Godfrey Pick. Tiburcio Pea, MSN, FNP-C [facility]  [address] [place], [place] [address] [phone]

## 2017-09-11 LAB — CBC WITH DIFFERENTIAL/PLATELET
BASOS: 0 %
Basophils Absolute: 0 10*3/uL (ref 0.0–0.2)
EOS (ABSOLUTE): 0.1 10*3/uL (ref 0.0–0.4)
Eos: 1 %
HEMATOCRIT: 38.1 % (ref 34.0–46.6)
HEMOGLOBIN: 12.3 g/dL (ref 11.1–15.9)
IMMATURE GRANS (ABS): 0 10*3/uL (ref 0.0–0.1)
Immature Granulocytes: 0 %
LYMPHS: 31 %
Lymphocytes Absolute: 2.3 10*3/uL (ref 0.7–3.1)
MCH: 26.9 pg (ref 26.6–33.0)
MCHC: 32.3 g/dL (ref 31.5–35.7)
MCV: 83 fL (ref 79–97)
MONOCYTES: 5 %
Monocytes Absolute: 0.4 10*3/uL (ref 0.1–0.9)
NEUTROS ABS: 4.7 10*3/uL (ref 1.4–7.0)
Neutrophils: 63 %
Platelets: 290 10*3/uL (ref 150–379)
RBC: 4.57 x10E6/uL (ref 3.77–5.28)
RDW: 15.8 % — ABNORMAL HIGH (ref 12.3–15.4)
WBC: 7.6 10*3/uL (ref 3.4–10.8)

## 2017-09-11 LAB — COMPREHENSIVE METABOLIC PANEL
A/G RATIO: 1.5 (ref 1.2–2.2)
ALBUMIN: 4.6 g/dL (ref 3.5–5.5)
ALT: 17 IU/L (ref 0–32)
AST: 11 IU/L (ref 0–40)
Alkaline Phosphatase: 57 IU/L (ref 39–117)
BILIRUBIN TOTAL: 0.2 mg/dL (ref 0.0–1.2)
BUN / CREAT RATIO: 12 (ref 9–23)
BUN: 7 mg/dL (ref 6–20)
CHLORIDE: 103 mmol/L (ref 96–106)
CO2: 25 mmol/L (ref 20–29)
Calcium: 10 mg/dL (ref 8.7–10.2)
Creatinine, Ser: 0.57 mg/dL (ref 0.57–1.00)
GFR calc non Af Amer: 127 mL/min/{1.73_m2} (ref >59)
GFR, EST AFRICAN AMERICAN: 147 mL/min/{1.73_m2} (ref >59)
GLOBULIN, TOTAL: 3.1 g/dL (ref 1.5–4.5)
Glucose: 63 mg/dL — ABNORMAL LOW (ref 65–99)
POTASSIUM: 3.9 mmol/L (ref 3.5–5.2)
Sodium: 144 mmol/L (ref 134–144)
TOTAL PROTEIN: 7.7 g/dL (ref 6.0–8.5)

## 2017-09-11 LAB — MICROALBUMIN, URINE: Microalbumin, Urine: 5 ug/mL

## 2017-09-21 ENCOUNTER — Ambulatory Visit: Payer: Self-pay | Admitting: Endocrinology

## 2017-10-07 MED FILL — ?METOPROLOL 25 MG TABLET: 25 | 30 days supply | Qty: 15 | Fill #1

## 2017-10-07 MED FILL — HYDROCHLOROTHIAZIDE 25 MG T: 25 | 30 days supply | Qty: 30 | Fill #1

## 2017-10-07 MED FILL — ?ATORVASTATIN 40MG TAB: 40 | 30 days supply | Qty: 30 | Fill #1

## 2017-10-08 ENCOUNTER — Ambulatory Visit: Payer: Self-pay | Admitting: Family Medicine

## 2017-10-08 ENCOUNTER — Encounter (HOSPITAL_COMMUNITY): Payer: Self-pay | Admitting: Emergency Medicine

## 2017-10-08 ENCOUNTER — Emergency Department (HOSPITAL_COMMUNITY)
Admission: EM | Admit: 2017-10-08 | Discharge: 2017-10-09 | Disposition: A | Discharge status: Home / Self Care | Payer: Self-pay | Attending: Emergency Medicine | Admitting: Emergency Medicine

## 2017-10-08 ENCOUNTER — Other Ambulatory Visit: Payer: Self-pay

## 2017-10-08 ENCOUNTER — Encounter: Payer: Self-pay | Admitting: Family Medicine

## 2017-10-08 DIAGNOSIS — E119 Type 2 diabetes mellitus without complications: Secondary | ICD-10-CM | POA: Insufficient documentation

## 2017-10-08 DIAGNOSIS — Z794 Long term (current) use of insulin: Secondary | ICD-10-CM | POA: Insufficient documentation

## 2017-10-08 DIAGNOSIS — R42 Dizziness and giddiness: Secondary | ICD-10-CM | POA: Insufficient documentation

## 2017-10-08 DIAGNOSIS — F17210 Nicotine dependence, cigarettes, uncomplicated: Secondary | ICD-10-CM | POA: Insufficient documentation

## 2017-10-08 DIAGNOSIS — Z79899 Other long term (current) drug therapy: Secondary | ICD-10-CM | POA: Insufficient documentation

## 2017-10-08 LAB — URINALYSIS, ROUTINE W REFLEX MICROSCOPIC
BACTERIA UA: NONE SEEN
Bilirubin Urine: NEGATIVE
Glucose, UA: NEGATIVE mg/dL
HGB URINE DIPSTICK: NEGATIVE
Ketones, ur: NEGATIVE mg/dL
NITRITE: NEGATIVE
PROTEIN: NEGATIVE mg/dL
RBC / HPF: NONE SEEN RBC/hpf (ref 0–5)
SPECIFIC GRAVITY, URINE: 1.017 (ref 1.005–1.030)
pH: 6 (ref 5.0–8.0)

## 2017-10-08 LAB — BASIC METABOLIC PANEL
Anion gap: 12 (ref 5–15)
BUN: 8 mg/dL (ref 6–20)
CHLORIDE: 103 mmol/L (ref 101–111)
CO2: 26 mmol/L (ref 22–32)
CREATININE: 0.69 mg/dL (ref 0.44–1.00)
Calcium: 9.8 mg/dL (ref 8.9–10.3)
GFR calc Af Amer: >60 mL/min (ref >60)
GFR calc non Af Amer: >60 mL/min (ref >60)
GLUCOSE: 96 mg/dL (ref 65–99)
POTASSIUM: 3 mmol/L — AB (ref 3.5–5.1)
SODIUM: 141 mmol/L (ref 135–145)

## 2017-10-08 LAB — CBC
HEMATOCRIT: 36.6 % (ref 36.0–46.0)
Hemoglobin: 11.6 g/dL — ABNORMAL LOW (ref 12.0–15.0)
MCH: 26.5 pg (ref 26.0–34.0)
MCHC: 31.7 g/dL (ref 30.0–36.0)
MCV: 83.8 fL (ref 78.0–100.0)
PLATELETS: 248 10*3/uL (ref 150–400)
RBC: 4.37 MIL/uL (ref 3.87–5.11)
RDW: 14.9 % (ref 11.5–15.5)
WBC: 6.6 10*3/uL (ref 4.0–10.5)

## 2017-10-08 LAB — I-STAT BETA HCG BLOOD, ED (MC, WL, AP ONLY)

## 2017-10-08 NOTE — ED Triage Notes (Signed)
Pt complaint of worsening/more episodes of dizziness over past week; chills and nausea associated. Hx of DM. No n/d or cough.

## 2017-10-08 NOTE — ED Provider Notes (Signed)
TIME SEEN: 11:52 PM  CHIEF COMPLAINT: Lightheadedness  HPI: Patient is a 27 year old female with history of hypertension, insulin-dependent diabetes, hyperlipidemia who presents to the emergency department with complaints of feeling lightheaded intermittently today.  Also had palpitations and felt very sweaty this morning was concerned that her blood sugar was dropping with these episodes.  States blood sugars morning was 88.  States that her blood sugar normally runs much higher than this but she was just started on insulin.  She has not had any glucose less than 80s.  No fevers, vomiting, diarrhea.  States that she has been feeling cold recently and feels like "my circulation is off".  No numbness, tingling or focal weakness.  No headache.  No chest pain or shortness of breath.  No syncopal event.  ROS: See HPI Constitutional: no fever  Eyes: no drainage  ENT: no runny nose   Cardiovascular:  no chest pain  Resp: no SOB  GI: no vomiting GU: no dysuria Integumentary: no rash  Allergy: no hives  Musculoskeletal: no leg swelling  Neurological: no slurred speech ROS otherwise negative  PAST MEDICAL HISTORY/PAST SURGICAL HISTORY:  Past Medical History:  Diagnosis Date  . Abortion May 2013  . Anemia   . Atrial fibrillation (HCC)   . Diabetes mellitus without complication (HCC)   . History of ITP   . Hypercholesteremia   . Hypertension   . Hyperthyroidism   . Iron deficiency anemia 09/04/2016  . Ovarian cyst     MEDICATIONS:  Prior to Admission medications   Medication Sig Start Date End Date Taking? Authorizing Provider  atorvastatin (LIPITOR) 40 MG tablet Take 1 tablet (40 mg total) by mouth daily. 09/10/17  Yes Bing Neighbors, FNP  Biotin 40981 MCG TABS Take 1,000 mcg by mouth daily.    Yes [provider]  cetirizine (ZYRTEC) 10 MG tablet Take 1 tablet (10 mg total) by mouth daily as needed for allergies. 09/10/17  Yes Bing Neighbors, FNP  hydrochlorothiazide  (HYDRODIURIL) 25 MG tablet Take 1 tablet (25 mg total) by mouth daily. 09/10/17  Yes Bing Neighbors, FNP  Insulin Isophane & Regular Human (NOVOLIN 70/30 FLEXPEN RELION) (70-30) 100 UNIT/ML PEN Inject 35 Units into the skin 2 (two) times daily with a meal. 09/10/17  Yes Bing Neighbors, FNP  IRON PO Take 1 tablet by mouth daily.    Yes [provider]  lisinopril (PRINIVIL,ZESTRIL) 5 MG tablet Take 1 tablet (5 mg total) by mouth daily. 09/10/17  Yes Bing Neighbors, FNP  metFORMIN (GLUCOPHAGE) 500 MG tablet Take 1 tablet (500 mg total) by mouth 2 (two) times daily with a meal. 09/10/17  Yes Bing Neighbors, FNP  methimazole (TAPAZOLE) 10 MG tablet Take 1 tablet (10 mg total) by mouth 2 (two) times daily. 09/10/17  Yes Bing Neighbors, FNP  metoprolol tartrate (LOPRESSOR) 25 MG tablet Take 0.5 tablets (12.5 mg total) by mouth every morning. 09/10/17  Yes Bing Neighbors, FNP  POTASSIUM PO Take 1 tablet by mouth daily.    Yes [provider]  Insulin Pen Needle 31G X 5 MM MISC Inject insulin BIDWC 09/10/17   Bing Neighbors, FNP    ALLERGIES:  Allergies  Allergen Reactions  . Benadryl [Diphenhydramine Hcl] Anaphylaxis and Hives  . Chloroxine Hives    Clorox    SOCIAL HISTORY:  Social History   Tobacco Use  . Smoking status: Current Every Day Smoker    Packs/day: 0.25  Types: Cigarettes  . Smokeless tobacco: Never Used  Substance Use Topics  . Alcohol use: Yes    Alcohol/week: 0.0 oz    Comment: 2-3 times a month     FAMILY HISTORY: Family History  Problem Relation Age of Onset  . Diabetes Other        Parent  . Cancer Other        Breast Cancer-Parent  . Hypertension Other        Parent  . Hyperlipidemia Other        Parent  . Arthritis Other        Parent    EXAM: BP 131/78   Pulse 68   Temp 98.1 F (36.7 C) (Oral)   Resp 17   LMP 09/11/2017   SpO2 100%  CONSTITUTIONAL: Alert and oriented and responds appropriately to  questions. Well-appearing; well-nourished HEAD: Normocephalic EYES: Conjunctivae clear, pupils appear equal, EOMI ENT: normal nose; moist mucous membranes NECK: Supple, no meningismus, no nuchal rigidity, no LAD  CARD: RRR; S1 and S2 appreciated; no murmurs, no clicks, no rubs, no gallops RESP: Normal chest excursion without splinting or tachypnea; breath sounds clear and equal bilaterally; no wheezes, no rhonchi, no rales, no hypoxia or respiratory distress, speaking full sentences ABD/GI: Normal bowel sounds; non-distended; soft, non-tender, no rebound, no guarding, no peritoneal signs, no hepatosplenomegaly BACK:  The back appears normal and is non-tender to palpation, there is no CVA tenderness EXT: Normal ROM in all joints; non-tender to palpation; no edema; normal capillary refill; no cyanosis, no calf tenderness or swelling, 2+ radial pulses bilaterally    SKIN: Normal color for age and race; warm; no rash NEURO: Moves all extremities equally, normal sensation diffusely, cranial nerves II through XII intact, normal speech, normal gait PSYCH: The patient's mood and manner are appropriate. Grooming and personal hygiene are appropriate.  MEDICAL DECISION MAKING: Patient here with lightheadedness.  Currently asymptomatic.  Denies vertigo.  Normal neurologic exam.  Had palpitations but currently in a sinus rhythm.  No chest pain.  Labs unremarkable other than potassium of 3.0.  Hemoglobin 11.6.  Urine shows no sign of infection or dehydration.  Pregnancy test is negative.  EKG shows no ischemia, arrhythmia or interval changes.  Doubt intracranial hemorrhage, stroke, PE, ACS.  Discussed with patient that this could be because of her blood sugar being lower than she normally runs I recommended follow-up with her primary care physician.  Recommended she watch her blood glucose closely at home and eat multiple small meals a day.  We discussed at length return precautions.  Patient is comfortable with  this plan.  At this time, I do not feel there is any life-threatening condition present. I have reviewed and discussed all results (EKG, imaging, lab, urine as appropriate) and exam findings with patient/family. I have reviewed nursing notes and appropriate previous records.  I feel the patient is safe to be discharged home without further emergent workup and can continue workup as an outpatient as needed. Discussed usual and customary return precautions. Patient/family verbalize understanding and are comfortable with this plan.  Outpatient follow-up has been provided if needed. All questions have been answered.      EKG Interpretation  Date/Time:  Friday October 08 2017 19:08:20 EST Ventricular Rate:  78 PR Interval:    QRS Duration: 111 QT Interval:  388 QTC Calculation: 442 R Axis:   43 Text Interpretation:  Sinus rhythm Consider right atrial enlargement Consider right ventricular hypertrophy Borderline T abnormalities, anterior leads  No significant change since last tracing Confirmed by Ioane Bhola, Baxter Hire [phone]) on 10/08/2017 11:18:35 PM         Annika Selke, Layla Maw, DO 10/09/17 0041

## 2017-10-09 NOTE — Discharge Instructions (Signed)
Please increase your water intake at home.  Please follow-up closely with your primary care physician to see if they want to adjust your insulin.  Please monitor your blood glucose closely at home and continue to eat multiple small meals a day.

## 2017-10-10 ENCOUNTER — Emergency Department (HOSPITAL_COMMUNITY)
Admission: EM | Admit: 2017-10-10 | Discharge: 2017-10-10 | Disposition: A | Discharge status: Home / Self Care | Payer: Self-pay | Attending: Emergency Medicine | Admitting: Emergency Medicine

## 2017-10-10 ENCOUNTER — Encounter (HOSPITAL_COMMUNITY): Payer: Self-pay | Admitting: Nurse Practitioner

## 2017-10-10 DIAGNOSIS — R42 Dizziness and giddiness: Secondary | ICD-10-CM | POA: Insufficient documentation

## 2017-10-10 DIAGNOSIS — Z5321 Procedure and treatment not carried out due to patient leaving prior to being seen by health care provider: Secondary | ICD-10-CM | POA: Insufficient documentation

## 2017-10-10 NOTE — ED Triage Notes (Signed)
Pt is c/o lightheadedness and "feeling the heart beat for while," seen last night for similar symptoms states 'keeps happening.'

## 2017-10-10 NOTE — ED Notes (Signed)
Patient left AMA per registration 

## 2017-10-11 ENCOUNTER — Ambulatory Visit (INDEPENDENT_AMBULATORY_CARE_PROVIDER_SITE_OTHER): Payer: Self-pay | Admitting: Family Medicine

## 2017-10-11 ENCOUNTER — Encounter: Payer: Self-pay | Admitting: Family Medicine

## 2017-10-11 VITALS — BP 118/64 | HR 96 | Temp 98.0°F | Ht 69.0 in | Wt 216.0 lb

## 2017-10-11 DIAGNOSIS — E119 Type 2 diabetes mellitus without complications: Secondary | ICD-10-CM

## 2017-10-11 DIAGNOSIS — R002 Palpitations: Secondary | ICD-10-CM

## 2017-10-11 DIAGNOSIS — E0590 Thyrotoxicosis, unspecified without thyrotoxic crisis or storm: Secondary | ICD-10-CM

## 2017-10-11 LAB — POCT URINALYSIS DIP (DEVICE)
BILIRUBIN URINE: NEGATIVE
Glucose, UA: NEGATIVE mg/dL
KETONES UR: NEGATIVE mg/dL
Leukocytes, UA: NEGATIVE
NITRITE: NEGATIVE
PH: 5.5 (ref 5.0–8.0)
Protein, ur: NEGATIVE mg/dL
Urobilinogen, UA: 0.2 mg/dL (ref 0.0–1.0)

## 2017-10-11 LAB — POCT GLYCOSYLATED HEMOGLOBIN (HGB A1C): Hemoglobin A1C: 8.4

## 2017-10-11 LAB — GLUCOSE, CAPILLARY: GLUCOSE-CAPILLARY: 117 mg/dL — AB (ref 65–99)

## 2017-10-11 MED ORDER — SITAGLIPTIN PHOSPHATE 50 MG PO TABS: 50.0000 mg | ORAL_TABLET | Freq: Every day | ORAL | 2 refills | Status: DC

## 2017-10-11 MED ORDER — METFORMIN HCL 1000 MG PO TABS: 1000.0000 mg | ORAL_TABLET | Freq: Two times a day (BID) | ORAL | 2 refills | Status: DC

## 2017-10-11 MED FILL — JANUVIA 50 MG TABLET: 50 | 30 days supply | Qty: 30 | Fill #0

## 2017-10-11 MED FILL — ?METFORMIN HCL 1,000 MG TAB: 1000 | 30 days supply | Qty: 60 | Fill #0

## 2017-10-11 NOTE — Progress Notes (Signed)
Patient ID: Kellie Shaffer, female    DOB: October 26, 1990, 27 y.o.   MRN: [id number]  PCP: Bing Neighbors, FNP  Chief Complaint  Patient presents with  . Follow-up    4 weeks on chronic condition     Subjective:  HPI Kellie Shaffer is a 27 y.o. female with hx atrial flutter,th diabetes and hyperthyroidism, thrombocytopenia,  presents for a 4 week follow-up of  Diabetes. Kellie Shaffer recently newly diagnosed diabetic. Last A1C 11.5, 1 month previously.  She reports that she has been continuously taking medications as prescribed and administering insulin as prescribed.  She is currently managed on 35 units twice daily of Novolin.  She complains today of periodic episodes of hypoglycemia.  She works second shift and therefore does not eat dinner until around 11 PM and at that time she administered her second dose of Novolin.  She reports on a few occasions waking up with blood sugars low in the 70s and 80s, shaking and sweating.  She stopped taking the insulin 3 days ago due to these repeated episodes of low blood sugars.  She reports anxiety regarding continue to use insulin as this likes the way she feels when her blood sugar is low.  She reports no readings greater than 180.  She has made great efforts to improve dietary choices to manage her diabetes.  He denies any shortness of breath, chest pain, dizziness, or new weakness.  She did have an episode that she experienced some palpitations and presented to the ED a few days prior.  She was worked up and and all labs were normal and she was discharged home. She reports no more problems with dizziness or palpitations since this occurrence. Social History   Socioeconomic History  . Marital status: Single    Spouse name: Not on file  . Number of children: Not on file  . Years of education: 33  . Highest education level: Not on file  Social Needs  . Financial resource strain: Not on file  . Food insecurity - worry: Not on file  . Food  insecurity - inability: Not on file  . Transportation needs - medical: Not on file  . Transportation needs - non-medical: Not on file  Occupational History  . Occupation: [employer]  Tobacco Use  . Smoking status: Current Every Day Smoker    Packs/day: 0.25    Types: Cigarettes  . Smokeless tobacco: Never Used  Substance and Sexual Activity  . Alcohol use: Yes    Alcohol/week: 0.0 oz    Comment: 2-3 times a month   . Drug use: No  . Sexual activity: Yes    Birth control/protection: Condom  Other Topics Concern  . Not on file  Social History Narrative   Regular exercise-yes   Caffeine Use-yes          Family History  Problem Relation Age of Onset  . Diabetes Other        Parent  . Cancer Other        Breast Cancer-Parent  . Hypertension Other        Parent  . Hyperlipidemia Other        Parent  . Arthritis Other        Parent    Review of Systems Pertinent negative indicated in HPI  Patient Active Problem List   Diagnosis Date Noted  . DKA (diabetic ketoacidoses) (HCC) 08/27/2017  . Hyperglycemia 08/13/2017  . Oral candidiasis 08/13/2017  . Hypokalemia 07/19/2017  .  Acute appendicitis 01/14/2017  . Iron deficiency anemia 09/04/2016  . Atrial flutter (HCC) 07/05/2014  . Hyperthyroidism 07/05/2014  . Thrombocytopenia (HCC) 05/27/2014  . Acute appendicitis, uncomplicated 05/26/2014  . Hypercholesteremia     Allergies  Allergen Reactions  . Benadryl [Diphenhydramine Hcl] Anaphylaxis and Hives  . Chloroxine Hives    Clorox    Prior to Admission medications   Medication Sig Start Date End Date Taking? Authorizing Provider  atorvastatin (LIPITOR) 40 MG tablet Take 1 tablet (40 mg total) by mouth daily. 09/10/17  Yes Bing Neighbors, FNP  Biotin 16109 MCG TABS Take 1,000 mcg by mouth daily.    Yes [provider]  cetirizine (ZYRTEC) 10 MG tablet Take 1 tablet (10 mg total) by mouth daily as needed for allergies. 09/10/17  Yes Bing Neighbors, FNP  hydrochlorothiazide (HYDRODIURIL) 25 MG tablet Take 1 tablet (25 mg total) by mouth daily. 09/10/17  Yes Bing Neighbors, FNP  Insulin Pen Needle 31G X 5 MM MISC Inject insulin BIDWC 09/10/17  Yes Bing Neighbors, FNP  IRON PO Take 1 tablet by mouth daily.    Yes [provider]  lisinopril (PRINIVIL,ZESTRIL) 5 MG tablet Take 1 tablet (5 mg total) by mouth daily. 09/10/17  Yes Bing Neighbors, FNP  metFORMIN (GLUCOPHAGE) 500 MG tablet Take 1 tablet (500 mg total) by mouth 2 (two) times daily with a meal. 09/10/17  Yes Bing Neighbors, FNP  methimazole (TAPAZOLE) 10 MG tablet Take 1 tablet (10 mg total) by mouth 2 (two) times daily. 09/10/17  Yes Bing Neighbors, FNP  metoprolol tartrate (LOPRESSOR) 25 MG tablet Take 0.5 tablets (12.5 mg total) by mouth every morning. 09/10/17  Yes Bing Neighbors, FNP  POTASSIUM PO Take 1 tablet by mouth daily.    Yes [provider]  Insulin Isophane & Regular Human (NOVOLIN 70/30 FLEXPEN RELION) (70-30) 100 UNIT/ML PEN Inject 35 Units into the skin 2 (two) times daily with a meal. Patient not taking: Reported on 10/11/2017 09/10/17   Bing Neighbors, FNP    Past Medical, Surgical Family and Social History reviewed and updated.    Objective:   Today's Vitals   10/11/17 1515  BP: 118/64  Pulse: 96  Temp: 98 F (36.7 C)  TempSrc: Oral  SpO2: 100%  Weight: 216 lb (98 kg)  Height: 5\' 9"  (1.753 m)    Wt Readings from Last 3 Encounters:  10/11/17 216 lb (98 kg)  09/10/17 223 lb 12.8 oz (101.5 kg)  09/07/17 223 lb 9.6 oz (101.4 kg)    Physical Exam  Constitutional: She is oriented to person, place, and time. She appears well-developed and well-nourished.  HENT:  Head: Normocephalic and atraumatic.  Right Ear: External ear normal.  Left Ear: External ear normal.  Nose: Nose normal.  Mouth/Throat: Oropharynx is clear and moist.  Eyes: Conjunctivae and EOM are normal. Pupils are equal, round, and reactive to  light.  Neck: No thyromegaly present.  Cardiovascular: Normal rate, normal heart sounds and intact distal pulses.  Pulmonary/Chest: Effort normal and breath sounds normal.  Musculoskeletal: Normal range of motion.  Lymphadenopathy:    She has no cervical adenopathy.  Neurological: She is alert and oriented to person, place, and time.  Skin: Skin is warm and dry.  Psychiatric: She has a normal mood and affect. Her behavior is normal. Judgment normal.   Assessment & Plan:  1. Type 2 diabetes mellitus without complication, without long-term current use of insulin (HCC)  (  Hb A1C) 8.4 today, improved significantly over the course of 4 weeks. Will trial patient on metformin 1000 mg twice daily and add Januvia 50 mg  as she prefers not to resume insulin.  She is encouraged to incorporate physical activity at a minimum of 2-3 days 15-minute increments of vigorous activity to further facilitate and improvement  glycemic control in addition to medications.  2. Hyperthyroidism, continue follow-up with endocrinology for management of hypothyroidism.  3. Palpitations, regular rate and rhythm today.  Palpitations may be secondary to hypothyroidism.  Patient encouraged not to miss any doses of her metoprolol or methimazole.     Meds ordered this encounter  Medications  . metFORMIN (GLUCOPHAGE) 1000 MG tablet    Sig: Take 1 tablet (1,000 mg total) by mouth 2 (two) times daily with a meal.    Dispense:  90 tablet    Refill:  2    Dose change only    Order Specific Question:   Supervising Provider    Answer:   Quentin AngstJEGEDE, OLUGBEMIGA E [id number]]  . sitaGLIPtin (JANUVIA) 50 MG tablet    Sig: Take 1 tablet (50 mg total) by mouth daily.    Dispense:  90 tablet    Refill:  2    Order Specific Question:   Supervising Provider    Answer:   Quentin AngstJEGEDE, OLUGBEMIGA E [id number]]    Return for care in 6 weeks for evaluation of A1c and diabetes management.   Kellie PickKimberly S. Tiburcio PeaHarris, MSN, FNP-C [facility]  [address] [place], [place] [address] [phone]

## 2017-10-11 NOTE — Patient Instructions (Signed)
New diabetes plan: Metformin 1000 mg twice daily with food. Januvia 50 mg once daily.     Diabetes Mellitus and Nutrition When you have diabetes (diabetes mellitus), it is very important to have healthy eating habits because your blood sugar (glucose) levels are greatly affected by what you eat and drink. Eating healthy foods in the appropriate amounts, at about the same times every day, can help you:  Control your blood glucose.  Lower your risk of heart disease.  Improve your blood pressure.  Reach or maintain a healthy weight.  Every person with diabetes is different, and each person has different needs for a meal plan. Your health care provider may recommend that you work with a diet and nutrition specialist (dietitian) to make a meal plan that is best for you. Your meal plan may vary depending on factors such as:  The calories you need.  The medicines you take.  Your weight.  Your blood glucose, blood pressure, and cholesterol levels.  Your activity level.  Other health conditions you have, such as heart or kidney disease.  How do carbohydrates affect me? Carbohydrates affect your blood glucose level more than any other type of food. Eating carbohydrates naturally increases the amount of glucose in your blood. Carbohydrate counting is a method for keeping track of how many carbohydrates you eat. Counting carbohydrates is important to keep your blood glucose at a healthy level, especially if you use insulin or take certain oral diabetes medicines. It is important to know how many carbohydrates you can safely have in each meal. This is different for every person. Your dietitian can help you calculate how many carbohydrates you should have at each meal and for snack. Foods that contain carbohydrates include:  Bread, cereal, rice, pasta, and crackers.  Potatoes and corn.  Peas, beans, and lentils.  Milk and yogurt.  Fruit and juice.  Desserts, such as cakes, cookies,  ice cream, and candy.  How does alcohol affect me? Alcohol can cause a sudden decrease in blood glucose (hypoglycemia), especially if you use insulin or take certain oral diabetes medicines. Hypoglycemia can be a life-threatening condition. Symptoms of hypoglycemia (sleepiness, dizziness, and confusion) are similar to symptoms of having too much alcohol. If your health care provider says that alcohol is safe for you, follow these guidelines:  Limit alcohol intake to no more than 1 drink per day for nonpregnant women and 2 drinks per day for men. One drink equals 12 oz of beer, 5 oz of wine, or 1 oz of hard liquor.  Do not drink on an empty stomach.  Keep yourself hydrated with water, diet soda, or unsweetened iced tea.  Keep in mind that regular soda, juice, and other mixers may contain a lot of sugar and must be counted as carbohydrates.  What are tips for following this plan? Reading food labels  Start by checking the serving size on the label. The amount of calories, carbohydrates, fats, and other nutrients listed on the label are based on one serving of the food. Many foods contain more than one serving per package.  Check the total grams (g) of carbohydrates in one serving. You can calculate the number of servings of carbohydrates in one serving by dividing the total carbohydrates by 15. For example, if a food has 30 g of total carbohydrates, it would be equal to 2 servings of carbohydrates.  Check the number of grams (g) of saturated and trans fats in one serving. Choose foods that have low or  no amount of these fats.  Check the number of milligrams (mg) of sodium in one serving. Most people should limit total sodium intake to less than 2,300 mg per day.  Always check the nutrition information of foods labeled as "low-fat" or "nonfat". These foods may be higher in added sugar or refined carbohydrates and should be avoided.  Talk to your dietitian to identify your daily goals for  nutrients listed on the label. Shopping  Avoid buying canned, premade, or processed foods. These foods tend to be high in fat, sodium, and added sugar.  Shop around the outside edge of the grocery store. This includes fresh fruits and vegetables, bulk grains, fresh meats, and fresh dairy. Cooking  Use low-heat cooking methods, such as baking, instead of high-heat cooking methods like deep frying.  Cook using healthy oils, such as olive, canola, or sunflower oil.  Avoid cooking with butter, cream, or high-fat meats. Meal planning  Eat meals and snacks regularly, preferably at the same times every day. Avoid going long periods of time without eating.  Eat foods high in fiber, such as fresh fruits, vegetables, beans, and whole grains. Talk to your dietitian about how many servings of carbohydrates you can eat at each meal.  Eat 4-6 ounces of lean protein each day, such as lean meat, chicken, fish, eggs, or tofu. 1 ounce is equal to 1 ounce of meat, chicken, or fish, 1 egg, or 1/4 cup of tofu.  Eat some foods each day that contain healthy fats, such as avocado, nuts, seeds, and fish. Lifestyle   Check your blood glucose regularly.  Exercise at least 30 minutes 5 or more days each week, or as told by your health care provider.  Take medicines as told by your health care provider.  Do not use any products that contain nicotine or tobacco, such as cigarettes and e-cigarettes. If you need help quitting, ask your health care provider.  Work with a Veterinary surgeon or diabetes educator to identify strategies to manage stress and any emotional and social challenges. What are some questions to ask my health care provider?  Do I need to meet with a diabetes educator?  Do I need to meet with a dietitian?  What number can I call if I have questions?  When are the best times to check my blood glucose? Where to find more information:  American Diabetes Association:  diabetes.org/food-and-fitness/food  Academy of Nutrition and Dietetics: https://www.vargas.com/  [employer] of Diabetes and Digestive and Kidney Diseases (NIH): FindJewelers.cz Summary  A healthy meal plan will help you control your blood glucose and maintain a healthy lifestyle.  Working with a diet and nutrition specialist (dietitian) can help you make a meal plan that is best for you.  Keep in mind that carbohydrates and alcohol have immediate effects on your blood glucose levels. It is important to count carbohydrates and to use alcohol carefully. This information is not intended to replace advice given to you by your health care provider. Make sure you discuss any questions you have with your health care provider. Document Released: 04/30/2005 Document Revised: 09/07/2016 Document Reviewed: 09/07/2016 Elsevier Interactive Patient Education  2018 [employer].     Sinus Tachycardia Sinus tachycardia is a kind of fast heartbeat. In sinus tachycardia, the heart beats more than 100 times a minute. Sinus tachycardia starts in a part of the heart called the sinus node. Sinus tachycardia may be harmless, or it may be a sign of a serious condition. What are the causes?  This condition may be caused by:  Exercise or exertion.  A fever.  Pain.  Loss of body fluids (dehydration).  Severe bleeding (hemorrhage).  Anxiety and stress.  Certain substances, including: ? Alcohol. ? Caffeine. ? Tobacco and nicotine products. ? Diet pills. ? Illegal drugs.  Medical conditions including: ? Heart disease. ? An infection. ? An overactive thyroid (hyperthyroidism). ? A lack of red blood cells (anemia).  What are the signs or symptoms? Symptoms of this condition include:  A feeling that the heart is beating quickly (palpitations).  Suddenly noticing your heartbeat  (cardiac awareness).  Dizziness.  Tiredness (fatigue).  Shortness of breath.  Chest pain.  Nausea.  Fainting.  How is this diagnosed? This condition is diagnosed with:  A physical exam.  Other tests, such as: ? Blood tests. ? An electrocardiogram (ECG). This test measures the electrical activity of the heart. ? Holter monitoring. For this test, you wear a device that records your heartbeat for one or more days.  You may be referred to a heart specialist (cardiologist). How is this treated? Treatment for this condition depends on the cause or underlying condition. Treatment may involve:  Treating the underlying condition.  Taking new medicines or changing your current medicines as told by your health care provider.  Making changes to your diet or lifestyle.  Practicing relaxation methods.  Follow these instructions at home: Lifestyle  Do not use any products that contain nicotine or tobacco, such as cigarettes and e-cigarettes. If you need help quitting, ask your health care provider.  Learn relaxation methods, like deep breathing, to help you when you get stressed or anxious.  Do not use illegal drugs, such as cocaine.  Do not abuse alcohol. Limit alcohol intake to no more than 1 drink a day for non-pregnant women and 2 drinks a day for men. One drink equals 12 oz of beer, 5 oz of wine, or 1 oz of hard liquor.  Find time to rest and relax often. This reduces stress.  Avoid: ? Caffeine. ? Stimulants such as over-the-counter diet pills or pills that help you to stay awake. ? Situations that cause anxiety or stress. General instructions  Drink enough fluids to keep your urine clear or pale yellow.  Take over-the-counter and prescription medicines only as told by your health care provider.  Keep all follow-up visits as told by your health care provider. This is important. Contact a health care provider if:  You have a fever.  You have vomiting or diarrhea  that keeps happening (is persistent). Get help right away if:  You have pain in your chest, upper arms, jaw, or neck.  You become weak or dizzy.  You feel faint.  You have palpitations that do not go away. This information is not intended to replace advice given to you by your health care provider. Make sure you discuss any questions you have with your health care provider. Document Released: 09/10/2004 Document Revised: 02/29/2016 Document Reviewed: 02/15/2015 Elsevier Interactive Patient Education  [employer]2018 Elsevier Inc.

## 2017-10-14 ENCOUNTER — Ambulatory Visit: Payer: Self-pay | Admitting: Endocrinology

## 2017-10-14 ENCOUNTER — Encounter: Payer: Self-pay | Admitting: Endocrinology

## 2017-10-14 VITALS — BP 108/64 | HR 82 | Ht 69.0 in | Wt 219.0 lb

## 2017-10-14 DIAGNOSIS — E0590 Thyrotoxicosis, unspecified without thyrotoxic crisis or storm: Secondary | ICD-10-CM

## 2017-10-14 LAB — T4, FREE: FREE T4: 0.89 ng/dL (ref 0.60–1.60)

## 2017-10-14 LAB — TSH: TSH: 4.56 u[IU]/mL — AB (ref 0.35–4.50)

## 2017-10-14 MED ORDER — METHIMAZOLE 10 MG PO TABS: 10.0000 mg | ORAL_TABLET | Freq: Every day | ORAL | 1 refills | Status: DC

## 2017-10-14 NOTE — Patient Instructions (Addendum)
blood tests are requested for you today.  We'll let you know about the results. It is best to never miss it. However, if you do miss a pill, take 2 the next time.  Please come back for a follow-up appointment in 4 months.  If ever you have fever while taking methimazole, stop it and call us, even if the reason is obvious, because of the risk of a rare side-effect.

## 2017-10-14 NOTE — Progress Notes (Signed)
Subjective:    Patient ID: Kellie Shaffer, female    DOB: 1991-01-18, 27 y.o.   MRN: [id number]  HPI Pt returns for f/u of hyperthyroidism (dx'ed 2013, during a pregnancy; US showed a small multinodular goiter, but she also has proptosis; she was then rx'ed with tapazole; she was lost to f/u, but hyperthyroidism was found again during a hospitalization in late 2015 for appendicitis; she was restarted on tapazole then; pt says she is not at risk for another pregnancy; f/u TFT in 2018 were normal off rx; later in 2018, it was resumed due to suppressed TSH).   She says she never misses the methimazole. She has intermitt palpitations, lightheadedness, and tremor.   Past Medical History:  Diagnosis Date  . Abortion May 2013  . Anemia   . Atrial fibrillation (HCC)   . Diabetes mellitus without complication (HCC)   . History of ITP   . Hypercholesteremia   . Hypertension   . Hyperthyroidism   . Iron deficiency anemia 09/04/2016  . Ovarian cyst     Past Surgical History:  Procedure Laterality Date  . APPENDECTOMY    . LAPAROSCOPIC APPENDECTOMY N/A 01/14/2017   Procedure: APPENDECTOMY LAPAROSCOPIC;  Surgeon: Claud KelpIngram, Haywood, MD;  Location: [facility];  Service: General;  Laterality: N/A;    Social History   Socioeconomic History  . Marital status: Single    Spouse name: Not on file  . Number of children: Not on file  . Years of education: 4014  . Highest education level: Not on file  Social Needs  . Financial resource strain: Not on file  . Food insecurity - worry: Not on file  . Food insecurity - inability: Not on file  . Transportation needs - medical: Not on file  . Transportation needs - non-medical: Not on file  Occupational History  . Occupation: [employer]  Tobacco Use  . Smoking status: Current Every Day Smoker    Packs/day: 0.25    Types: Cigarettes  . Smokeless tobacco: Never Used  Substance and Sexual Activity  . Alcohol use: Yes    Alcohol/week: 0.0 oz   Comment: 2-3 times a month   . Drug use: No  . Sexual activity: Yes    Birth control/protection: Condom  Other Topics Concern  . Not on file  Social History Narrative   Regular exercise-yes   Caffeine Use-yes          Current Outpatient Medications on File Prior to Visit  Medication Sig Dispense Refill  . atorvastatin (LIPITOR) 40 MG tablet Take 1 tablet (40 mg total) by mouth daily. 90 tablet 3  . Biotin 7829510000 MCG TABS Take 1,000 mcg by mouth daily.     . cetirizine (ZYRTEC) 10 MG tablet Take 1 tablet (10 mg total) by mouth daily as needed for allergies. 90 tablet 1  . hydrochlorothiazide (HYDRODIURIL) 25 MG tablet Take 1 tablet (25 mg total) by mouth daily. 90 tablet 1  . IRON PO Take 1 tablet by mouth daily.     Marland Kitchen. lisinopril (PRINIVIL,ZESTRIL) 5 MG tablet Take 1 tablet (5 mg total) by mouth daily. 90 tablet 1  . metFORMIN (GLUCOPHAGE) 1000 MG tablet Take 1 tablet (1,000 mg total) by mouth 2 (two) times daily with a meal. 90 tablet 2  . metoprolol tartrate (LOPRESSOR) 25 MG tablet Take 0.5 tablets (12.5 mg total) by mouth every morning. 90 tablet 1  . POTASSIUM PO Take 1 tablet by mouth daily.     . sitaGLIPtin (  JANUVIA) 50 MG tablet Take 1 tablet (50 mg total) by mouth daily. 90 tablet 2   No current facility-administered medications on file prior to visit.     Allergies  Allergen Reactions  . Benadryl [Diphenhydramine Hcl] Anaphylaxis and Hives  . Chloroxine Hives    Clorox    Family History  Problem Relation Age of Onset  . Diabetes Other        Parent  . Cancer Other        Breast Cancer-Parent  . Hypertension Other        Parent  . Hyperlipidemia Other        Parent  . Arthritis Other        Parent    BP 108/64 (BP Location: Left Arm, Patient Position: Sitting, Cuff Size: Large)   Pulse 82   Ht 5\' 9"  (1.753 m)   Wt 219 lb (99.3 kg)   LMP 10/10/2017   SpO2 98%   BMI 32.34 kg/m    Review of Systems Denies fever.     Objective:   Physical  Exam VITAL SIGNS:  See vs page GENERAL: no distress eyes: no periorbital swelling, but there is bilat proptosis  NECK: There is no palpable thyroid enlargement.  No thyroid nodule is palpable.  No palpable lymphadenopathy at the anterior neck.        Assessment & Plan:  Hyperthyroidism: due for recheck  Patient Instructions  blood tests are requested for you today.  We'll let you know about the results. It is best to never miss it. However, if you do miss a pill, take 2 the next time.  Please come back for a follow-up appointment in 4 months.  If ever you have fever while taking methimazole, stop it and call us, even if the reason is obvious, because of the risk of a rare side-effect.

## 2017-10-15 ENCOUNTER — Ambulatory Visit: Payer: Self-pay

## 2017-10-15 ENCOUNTER — Ambulatory Visit: Payer: Self-pay | Attending: Family Medicine

## 2017-10-26 MED FILL — methIMAzole 10 MG TABS: 10 | 30 days supply | Qty: 60 | Fill #1

## 2017-10-26 MED FILL — LISINOPRIL 5 MG TAB: 5 | 30 days supply | Qty: 30 | Fill #1

## 2017-11-02 ENCOUNTER — Encounter: Payer: Self-pay | Admitting: Family Medicine

## 2017-11-02 ENCOUNTER — Ambulatory Visit (INDEPENDENT_AMBULATORY_CARE_PROVIDER_SITE_OTHER): Payer: Self-pay | Admitting: Family Medicine

## 2017-11-02 VITALS — BP 120/60 | HR 98 | Temp 98.4°F | Ht 69.0 in | Wt 218.0 lb

## 2017-11-02 DIAGNOSIS — F410 Panic disorder [episodic paroxysmal anxiety] without agoraphobia: Secondary | ICD-10-CM

## 2017-11-02 DIAGNOSIS — F419 Anxiety disorder, unspecified: Secondary | ICD-10-CM

## 2017-11-02 DIAGNOSIS — E119 Type 2 diabetes mellitus without complications: Secondary | ICD-10-CM

## 2017-11-02 LAB — POCT URINALYSIS DIP (DEVICE)
Bilirubin Urine: NEGATIVE
Glucose, UA: NEGATIVE mg/dL
Hgb urine dipstick: NEGATIVE
Ketones, ur: NEGATIVE mg/dL
Leukocytes, UA: NEGATIVE
Nitrite: NEGATIVE
PH: 7 (ref 5.0–8.0)
PROTEIN: NEGATIVE mg/dL
SPECIFIC GRAVITY, URINE: 1.02 (ref 1.005–1.030)
UROBILINOGEN UA: 0.2 mg/dL (ref 0.0–1.0)

## 2017-11-02 MED ORDER — METFORMIN HCL 1000 MG PO TABS: 500.0000 mg | ORAL_TABLET | Freq: Two times a day (BID) | ORAL | 2 refills | Status: DC

## 2017-11-02 MED ORDER — BUSPIRONE HCL 10 MG PO TABS: 10.0000 mg | ORAL_TABLET | Freq: Three times a day (TID) | ORAL | 1 refills | Status: DC

## 2017-11-02 NOTE — Patient Instructions (Signed)
Start Buspar 10 mg up to 3 times per day as needed.      Living With Anxiety After being diagnosed with an anxiety disorder, you may be relieved to know why you have felt or behaved a certain way. It is natural to also feel overwhelmed about the treatment ahead and what it will mean for your life. With care and support, you can manage this condition and recover from it. How to cope with anxiety Dealing with stress Stress is your body's reaction to life changes and events, both good and bad. Stress can last just a few hours or it can be ongoing. Stress can play a major role in anxiety, so it is important to learn both how to cope with stress and how to think about it differently. Talk with your health care provider or a counselor to learn more about stress reduction. He or she may suggest some stress reduction techniques, such as:  Music therapy. This can include creating or listening to music that you enjoy and that inspires you.  Mindfulness-based meditation. This involves being aware of your normal breaths, rather than trying to control your breathing. It can be done while sitting or walking.  Centering prayer. This is a kind of meditation that involves focusing on a word, phrase, or sacred image that is meaningful to you and that brings you peace.  Deep breathing. To do this, expand your stomach and inhale slowly through your nose. Hold your breath for 3-5 seconds. Then exhale slowly, allowing your stomach muscles to relax.  Self-talk. This is a skill where you identify thought patterns that lead to anxiety reactions and correct those thoughts.  Muscle relaxation. This involves tensing muscles then relaxing them.  Choose a stress reduction technique that fits your lifestyle and personality. Stress reduction techniques take time and practice. Set aside 5-15 minutes a day to do them. Therapists can offer training in these techniques. The training may be covered by some insurance plans.  Other things you can do to manage stress include:  Keeping a stress diary. This can help you learn what triggers your stress and ways to control your response.  Thinking about how you respond to certain situations. You may not be able to control everything, but you can control your reaction.  Making time for activities that help you relax, and not feeling guilty about spending your time in this way.  Therapy combined with coping and stress-reduction skills provides the best chance for successful treatment. Medicines Medicines can help ease symptoms. Medicines for anxiety include:  Anti-anxiety drugs.  Antidepressants.  Beta-blockers.  Medicines may be used as the main treatment for anxiety disorder, along with therapy, or if other treatments are not working. Medicines should be prescribed by a health care provider. Relationships Relationships can play a big part in helping you recover. Try to spend more time connecting with trusted friends and family members. Consider going to couples counseling, taking family education classes, or going to family therapy. Therapy can help you and others better understand the condition. How to recognize changes in your condition Everyone has a different response to treatment for anxiety. Recovery from anxiety happens when symptoms decrease and stop interfering with your daily activities at home or work. This may mean that you will start to:  Have better concentration and focus.  Sleep better.  Be less irritable.  Have more energy.  Have improved memory.  It is important to recognize when your condition is getting worse. Contact your health care  provider if your symptoms interfere with home or work and you do not feel like your condition is improving. Where to find help and support: You can get help and support from these sources:  Self-help groups.  Online and [employer]community organizations.  A trusted spiritual leader.  Couples  counseling.  Family education classes.  Family therapy.  Follow these instructions at home:  Eat a healthy diet that includes plenty of vegetables, fruits, whole grains, low-fat dairy products, and lean protein. Do not eat a lot of foods that are high in solid fats, added sugars, or salt.  Exercise. Most adults should do the following: ? Exercise for at least 150 minutes each week. The exercise should increase your heart rate and make you sweat (moderate-intensity exercise). ? Strengthening exercises at least twice a week.  Cut down on caffeine, tobacco, alcohol, and other potentially harmful substances.  Get the right amount and quality of sleep. Most adults need 7-9 hours of sleep each night.  Make choices that simplify your life.  Take over-the-counter and prescription medicines only as told by your health care provider.  Avoid caffeine, alcohol, and certain over-the-counter cold medicines. These may make you feel worse. Ask your pharmacist which medicines to avoid.  Keep all follow-up visits as told by your health care provider. This is important. Questions to ask your health care provider  Would I benefit from therapy?  How often should I follow up with a health care provider?  How long do I need to take medicine?  Are there any long-term side effects of my medicine?  Are there any alternatives to taking medicine? Contact a health care provider if:  You have a hard time staying focused or finishing daily tasks.  You spend many hours a day feeling worried about everyday life.  You become exhausted by worry.  You start to have headaches, feel tense, or have nausea.  You urinate more than normal.  You have diarrhea. Get help right away if:  You have a racing heart and shortness of breath.  You have thoughts of hurting yourself or others. If you ever feel like you may hurt yourself or others, or have thoughts about taking your own life, get help right away. You  can go to your nearest emergency department or call:  Your local emergency services (911 in the [place]  A suicide crisis helpline, such as the National Suicide Prevention Lifeline at 786 474 42251-(574)544-2922. This is open 24-hours a day.  Summary  Taking steps to deal with stress can help calm you.  Medicines cannot cure anxiety disorders, but they can help ease symptoms.  Family, friends, and partners can play a big part in helping you recover from an anxiety disorder. This information is not intended to replace advice given to you by your health care provider. Make sure you discuss any questions you have with your health care provider. Document Released: 07/28/2016 Document Revised: 07/28/2016 Document Reviewed: 07/28/2016 Elsevier Interactive Patient Education  [employer]2018 Elsevier Inc.

## 2017-11-02 NOTE — Progress Notes (Signed)
Patient ID: CHAVY AVERA, female    DOB: [birth year], 27 y.o.   MRN: [id number]  PCP: Bing Neighbors, FNP  Chief Complaint  Patient presents with  . Follow-up    6 weeks for diabetes    Subjective:  HPI Kellie Shaffer is a 27 y.o. female with type 2 diabetes, hyperthyroidism, obesity, presents for evaluation of panic attacks. Kellie Shaffer was recently diagnosed with type 2 diabetes induced by prolonged prednisone therapy for treatment of thrombocytopenia. During her last visit here in clinic due improvement of A1C from 11.2 which decreased 8.4, her Novolin was discontinued. She reports her blood sugars have remained less than 180 with current oral anti-diabetic therapy. However, today she is concerned for recent episodes of diaphoresis, dizziness, and palpitations. Denies any associates chest pain or SOB. Symptoms improve with sitting and taking deep breaths. Denies prior history of panic attacks or anxiety. Admits to currently experiencing life stressors and admits to feeling anxious. She complains of poor night-time rest and relaxation as she is constantly thinking and worrying about everything related to work and health.  Social History   Socioeconomic History  . Marital status: Single    Spouse name: Not on file  . Number of children: Not on file  . Years of education: 36  . Highest education level: Not on file  Social Needs  . Financial resource strain: Not on file  . Food insecurity - worry: Not on file  . Food insecurity - inability: Not on file  . Transportation needs - medical: Not on file  . Transportation needs - non-medical: Not on file  Occupational History  . Occupation: [employer]  Tobacco Use  . Smoking status: Current Every Day Smoker    Packs/day: 0.25    Types: Cigarettes  . Smokeless tobacco: Never Used  Substance and Sexual Activity  . Alcohol use: Yes    Alcohol/week: 0.0 oz    Comment: 2-3 times a month   . Drug use: No  . Sexual  activity: Yes    Birth control/protection: Condom  Other Topics Concern  . Not on file  Social History Narrative   Regular exercise-yes   Caffeine Use-yes          Family History  Problem Relation Age of Onset  . Diabetes Other        Parent  . Cancer Other        Breast Cancer-Parent  . Hypertension Other        Parent  . Hyperlipidemia Other        Parent  . Arthritis Other        Parent   Review of Systems Pertinent negatives listed in HPI  Patient Active Problem List   Diagnosis Date Noted  . DKA (diabetic ketoacidoses) (HCC) 08/27/2017  . Hyperglycemia 08/13/2017  . Oral candidiasis 08/13/2017  . Hypokalemia 07/19/2017  . Acute appendicitis 01/14/2017  . Iron deficiency anemia 09/04/2016  . Atrial flutter (HCC) 07/05/2014  . Hyperthyroidism 07/05/2014  . Thrombocytopenia (HCC) 05/27/2014  . Acute appendicitis, uncomplicated 05/26/2014  . Hypercholesteremia     Allergies  Allergen Reactions  . Benadryl [Diphenhydramine Hcl] Anaphylaxis and Hives  . Chloroxine Hives    Clorox    Prior to Admission medications   Medication Sig Start Date End Date Taking? Authorizing Provider  atorvastatin (LIPITOR) 40 MG tablet Take 1 tablet (40 mg total) by mouth daily. 09/10/17  Yes Bing Neighbors, FNP  Biotin 40981 MCG TABS Take 1,000 mcg  by mouth daily.    Yes [provider]  cetirizine (ZYRTEC) 10 MG tablet Take 1 tablet (10 mg total) by mouth daily as needed for allergies. 09/10/17  Yes Bing NeighborsHarris, Gara Kincade S, FNP  hydrochlorothiazide (HYDRODIURIL) 25 MG tablet Take 1 tablet (25 mg total) by mouth daily. 09/10/17  Yes Bing NeighborsHarris, Rawlins Stuard S, FNP  IRON PO Take 1 tablet by mouth daily.    Yes [provider]  lisinopril (PRINIVIL,ZESTRIL) 5 MG tablet Take 1 tablet (5 mg total) by mouth daily. 09/10/17  Yes Bing NeighborsHarris, Jeremiah Curci S, FNP  methimazole (TAPAZOLE) 10 MG tablet Take 1 tablet (10 mg total) by mouth daily. 10/14/17  Yes Romero BellingEllison, Sean, MD  metoprolol tartrate  (LOPRESSOR) 25 MG tablet Take 0.5 tablets (12.5 mg total) by mouth every morning. 09/10/17  Yes Bing NeighborsHarris, Lovell Roe S, FNP  POTASSIUM PO Take 1 tablet by mouth daily.    Yes [provider]  metFORMIN (GLUCOPHAGE) 1000 MG tablet Take 1 tablet (1,000 mg total) by mouth 2 (two) times daily with a meal. Patient not taking: Reported on 11/02/2017 10/11/17   Bing NeighborsHarris, Saamir Armstrong S, FNP  sitaGLIPtin (JANUVIA) 50 MG tablet Take 1 tablet (50 mg total) by mouth daily. Patient not taking: Reported on 11/02/2017 10/11/17   Bing NeighborsHarris, Rithy Mandley S, FNP    Past Medical, Surgical Family and Social History reviewed and updated.    Objective:   Today's Vitals   11/02/17 1430  BP: 120/60  Pulse: 98  Temp: 98.4 F (36.9 C)  TempSrc: Oral  SpO2: 100%  Weight: 218 lb (98.9 kg)  Height: 5\' 9"  (1.753 m)    Wt Readings from Last 3 Encounters:  11/02/17 218 lb (98.9 kg)  10/14/17 219 lb (99.3 kg)  10/11/17 216 lb (98 kg)   Physical Exam  Constitutional: She is oriented to person, place, and time. She appears well-developed and well-nourished.  HENT:  Head: Normocephalic and atraumatic.  Eyes: Pupils are equal, round, and reactive to light.  Neck: Normal range of motion. Neck supple.  Cardiovascular: Normal rate, regular rhythm, normal heart sounds and intact distal pulses.  Pulmonary/Chest: Effort normal and breath sounds normal.  Musculoskeletal: Normal range of motion.  Neurological: She is alert and oriented to person, place, and time.  Skin: Skin is warm and dry.  Psychiatric: Her speech is normal and behavior is normal. Judgment and thought content normal. Her mood appears anxious. Cognition and memory are normal.   Assessment & Plan:  1. Anxiety 2. Panic anxiety syndrome Will trial Buspirone 10 mg, 3 times daily for anxiety symptoms. Suspect this is situational anxiety related to current life stressors. If symptoms persist, will consider adding SSRI to current regimen.  3. Type 2 diabetes  mellitus without complication, without long-term current use of insulin (HCC), continue current regimen. A1C improving, 8.4. Continue increase physical activity and consistent carbohydrate diet.     Meds ordered this encounter  Medications  . metFORMIN (GLUCOPHAGE) 1000 MG tablet    Sig: Take 0.5 tablets (500 mg total) by mouth 2 (two) times daily with a meal.    Dispense:  90 tablet    Refill:  2    Dose change only    Order Specific Question:   Supervising Provider    Answer:   Quentin AngstJEGEDE, OLUGBEMIGA E [id number]]  . busPIRone (BUSPAR) 10 MG tablet    Sig: Take 1 tablet (10 mg total) by mouth 3 (three) times daily.    Dispense:  90 tablet    Refill:  1  Order Specific Question:   Supervising Provider    Answer:   Quentin Angst [[id number]]     Godfrey Pick Tiburcio Pea, MSN, FNP-C [facility]  [address] [place] Maroon [place], [place] [address] [phone]

## 2017-11-11 MED FILL — METOPROLOL TARTRATE 25 MG T: 25 | 30 days supply | Qty: 15 | Fill #2

## 2017-11-11 MED FILL — ATORVASTATIN 40 MG TABLET: 40 | 30 days supply | Qty: 30 | Fill #2

## 2017-11-12 MED FILL — HYDROCHLOROTHIAZIDE 25 MG T: 25 | 30 days supply | Qty: 30 | Fill #2

## 2017-11-12 MED FILL — ?CETIRIZINE HCL 10 MG TABLE: 10 | 30 days supply | Qty: 30 | Fill #1

## 2017-11-17 ENCOUNTER — Encounter (HOSPITAL_BASED_OUTPATIENT_CLINIC_OR_DEPARTMENT_OTHER): Payer: Self-pay

## 2017-11-17 ENCOUNTER — Other Ambulatory Visit: Payer: Self-pay

## 2017-11-17 ENCOUNTER — Emergency Department (HOSPITAL_BASED_OUTPATIENT_CLINIC_OR_DEPARTMENT_OTHER)
Admission: EM | Admit: 2017-11-17 | Discharge: 2017-11-18 | Disposition: A | Discharge status: Home / Self Care | Payer: Self-pay | Attending: Emergency Medicine | Admitting: Emergency Medicine

## 2017-11-17 DIAGNOSIS — F17210 Nicotine dependence, cigarettes, uncomplicated: Secondary | ICD-10-CM | POA: Insufficient documentation

## 2017-11-17 DIAGNOSIS — R778 Other specified abnormalities of plasma proteins: Secondary | ICD-10-CM

## 2017-11-17 DIAGNOSIS — R7989 Other specified abnormal findings of blood chemistry: Secondary | ICD-10-CM

## 2017-11-17 DIAGNOSIS — R42 Dizziness and giddiness: Secondary | ICD-10-CM | POA: Insufficient documentation

## 2017-11-17 DIAGNOSIS — Z7984 Long term (current) use of oral hypoglycemic drugs: Secondary | ICD-10-CM | POA: Insufficient documentation

## 2017-11-17 DIAGNOSIS — R110 Nausea: Secondary | ICD-10-CM | POA: Insufficient documentation

## 2017-11-17 DIAGNOSIS — R002 Palpitations: Secondary | ICD-10-CM | POA: Insufficient documentation

## 2017-11-17 DIAGNOSIS — E876 Hypokalemia: Secondary | ICD-10-CM | POA: Insufficient documentation

## 2017-11-17 DIAGNOSIS — E119 Type 2 diabetes mellitus without complications: Secondary | ICD-10-CM | POA: Insufficient documentation

## 2017-11-17 DIAGNOSIS — I10 Essential (primary) hypertension: Secondary | ICD-10-CM | POA: Insufficient documentation

## 2017-11-17 DIAGNOSIS — R748 Abnormal levels of other serum enzymes: Secondary | ICD-10-CM | POA: Insufficient documentation

## 2017-11-17 DIAGNOSIS — Z79899 Other long term (current) drug therapy: Secondary | ICD-10-CM | POA: Insufficient documentation

## 2017-11-17 NOTE — ED Triage Notes (Addendum)
Pt c/o intermittent dizziness and palpitations since 1400 today, no SOB, no pain, pt in no acute distress in triage.  She has had this before, had it checked out, and told it was anxiety.  Pt has a hx of a-fib, and hyperthyroidism, states she is compliant with all meds

## 2017-11-18 LAB — CBC WITH DIFFERENTIAL/PLATELET
BASOS ABS: 0 10*3/uL (ref 0.0–0.1)
Basophils Relative: 0 %
Eosinophils Absolute: 0.2 10*3/uL (ref 0.0–0.7)
Eosinophils Relative: 3 %
HEMATOCRIT: 37.5 % (ref 36.0–46.0)
Hemoglobin: 12.1 g/dL (ref 12.0–15.0)
LYMPHS PCT: 40 %
Lymphs Abs: 2.8 10*3/uL (ref 0.7–4.0)
MCH: 27.4 pg (ref 26.0–34.0)
MCHC: 32.3 g/dL (ref 30.0–36.0)
MCV: 84.8 fL (ref 78.0–100.0)
MONO ABS: 0.5 10*3/uL (ref 0.1–1.0)
Monocytes Relative: 7 %
NEUTROS ABS: 3.6 10*3/uL (ref 1.7–7.7)
Neutrophils Relative %: 50 %
Platelets: 203 10*3/uL (ref 150–400)
RBC: 4.42 MIL/uL (ref 3.87–5.11)
RDW: 15.1 % (ref 11.5–15.5)
WBC: 7 10*3/uL (ref 4.0–10.5)

## 2017-11-18 LAB — BASIC METABOLIC PANEL
ANION GAP: 11 (ref 5–15)
BUN: 13 mg/dL (ref 6–20)
CO2: 24 mmol/L (ref 22–32)
Calcium: 9.3 mg/dL (ref 8.9–10.3)
Chloride: 103 mmol/L (ref 101–111)
Creatinine, Ser: 0.69 mg/dL (ref 0.44–1.00)
GFR calc Af Amer: >60 mL/min (ref >60)
GLUCOSE: 112 mg/dL — AB (ref 65–99)
POTASSIUM: 2.6 mmol/L — AB (ref 3.5–5.1)
Sodium: 138 mmol/L (ref 135–145)

## 2017-11-18 LAB — POTASSIUM: POTASSIUM: 3.2 mmol/L — AB (ref 3.5–5.1)

## 2017-11-18 LAB — TSH: TSH: 4.488 u[IU]/mL (ref 0.350–4.500)

## 2017-11-18 LAB — TROPONIN I
TROPONIN I: 0.05 ng/mL — AB (ref <0.03)
Troponin I: 0.05 ng/mL (ref <0.03)

## 2017-11-18 MED ORDER — POTASSIUM CHLORIDE 10 MEQ/100ML IV SOLN
10.0000 meq | Freq: Once | INTRAVENOUS | Status: AC
  Administered 2017-11-18: 10 meq via INTRAVENOUS
  Filled 2017-11-18: qty 100

## 2017-11-18 MED ORDER — POTASSIUM CHLORIDE CRYS ER 20 MEQ PO TBCR
40.0000 meq | EXTENDED_RELEASE_TABLET | Freq: Once | ORAL | Status: AC
  Administered 2017-11-18: 40 meq via ORAL
  Filled 2017-11-18: qty 2

## 2017-11-18 MED ORDER — ONDANSETRON HCL 4 MG/2ML IJ SOLN: 4.0000 mg | Freq: Once | INTRAMUSCULAR | Status: DC

## 2017-11-18 MED ORDER — POTASSIUM CHLORIDE CRYS ER 20 MEQ PO TBCR: 20.0000 meq | EXTENDED_RELEASE_TABLET | Freq: Two times a day (BID) | ORAL | 0 refills | Status: DC

## 2017-11-18 MED ORDER — POTASSIUM CHLORIDE 20 MEQ/15ML (10%) PO SOLN
20.0000 meq | Freq: Once | ORAL | Status: AC
  Administered 2017-11-18: 20 meq via ORAL
  Filled 2017-11-18: qty 15

## 2017-11-18 NOTE — ED Notes (Signed)
Attempted IV draw in left AC unsuccessful.

## 2017-11-18 NOTE — ED Provider Notes (Signed)
[facility] Provider Note: Lowella Dell, MD, FACEP  CSN: [id number] MRN: [id number] ARRIVAL: 11/17/17 at 2350 ROOM: MH01/[facility]01   CHIEF COMPLAINT  Palpitations   HISTORY OF PRESENT ILLNESS  11/18/17 12:01 AM Kellie Shaffer is a 27 y.o. female with a history of hyperthyroidism on methimazole.  She also has a history of anxiety and atrial fibrillation.  She is here with intermittent palpitations since 2 PM yesterday.  By palpitations she states she feels like "something does not feel right" even though her heart rate has been in the 80s when she has checked it.  She denies associated chest pain or shortness of breath.  She has had some intermittent lightheadedness and nausea.    Past Medical History:  Diagnosis Date  . Abortion May 2013  . Anemia   . Atrial fibrillation (HCC)   . Diabetes mellitus without complication (HCC)   . History of ITP   . Hypercholesteremia   . Hypertension   . Hyperthyroidism   . Iron deficiency anemia 09/04/2016  . Ovarian cyst     Past Surgical History:  Procedure Laterality Date  . APPENDECTOMY    . LAPAROSCOPIC APPENDECTOMY N/A 01/14/2017   Procedure: APPENDECTOMY LAPAROSCOPIC;  Surgeon: Claud Kelp, MD;  Location: [facility];  Service: General;  Laterality: N/A;    Family History  Problem Relation Age of Onset  . Diabetes Other        Parent  . Cancer Other        Breast Cancer-Parent  . Hypertension Other        Parent  . Hyperlipidemia Other        Parent  . Arthritis Other        Parent    Social History   Tobacco Use  . Smoking status: Current Every Day Smoker    Packs/day: 0.25    Types: Cigarettes  . Smokeless tobacco: Never Used  Substance Use Topics  . Alcohol use: Yes    Alcohol/week: 0.0 oz    Comment: 2-3 times a month   . Drug use: No    Prior to Admission medications   Medication Sig Start Date End Date Taking? Authorizing Provider  atorvastatin (LIPITOR) 40 MG tablet Take 1 tablet (40 mg  total) by mouth daily. 09/10/17  Yes Bing Neighbors, FNP  Biotin 78295 MCG TABS Take 1,000 mcg by mouth daily.    Yes [provider]  busPIRone (BUSPAR) 10 MG tablet Take 1 tablet (10 mg total) by mouth 3 (three) times daily. 11/02/17  Yes Bing Neighbors, FNP  cetirizine (ZYRTEC) 10 MG tablet Take 1 tablet (10 mg total) by mouth daily as needed for allergies. 09/10/17  Yes Bing Neighbors, FNP  hydrochlorothiazide (HYDRODIURIL) 25 MG tablet Take 1 tablet (25 mg total) by mouth daily. 09/10/17  Yes Bing Neighbors, FNP  IRON PO Take 1 tablet by mouth daily.    Yes [provider]  lisinopril (PRINIVIL,ZESTRIL) 5 MG tablet Take 1 tablet (5 mg total) by mouth daily. 09/10/17  Yes Bing Neighbors, FNP  methimazole (TAPAZOLE) 10 MG tablet Take 1 tablet (10 mg total) by mouth daily. 10/14/17  Yes Romero Belling, MD  POTASSIUM PO Take 1 tablet by mouth daily.    Yes [provider]  metFORMIN (GLUCOPHAGE) 1000 MG tablet Take 0.5 tablets (500 mg total) by mouth 2 (two) times daily with a meal. 11/02/17   Bing Neighbors, FNP  metoprolol tartrate (LOPRESSOR) 25 MG tablet  Take 0.5 tablets (12.5 mg total) by mouth every morning. 09/10/17   Bing NeighborsHarris, Kimberly S, FNP    Allergies Benadryl [diphenhydramine hcl] and Chloroxine   REVIEW OF SYSTEMS  Negative except as noted here or in the History of Present Illness.   PHYSICAL EXAMINATION  Initial Vital Signs Height 5\' 9"  (1.753 m), weight 99.8 kg (220 lb), last menstrual period 11/03/2017.  Examination General: Well-developed, well-nourished female in no acute distress; appearance consistent with age of record HENT: normocephalic; atraumatic; proptosis Eyes: pupils equal, round and reactive to light; extraocular muscles intact Neck: supple Heart: regular rate and rhythm; no ectopy Lungs: clear to auscultation bilaterally Abdomen: soft; nondistended; nontender; bowel sounds present Extremities: No deformity; full  range of motion; pulses normal Neurologic: Awake, alert and oriented; motor function intact in all extremities and symmetric; no facial droop Skin: Warm and dry Psychiatric: Normal mood and affect   RESULTS  Summary of this visit's results, reviewed by myself:   EKG Interpretation  Date/Time:  Wednesday November 17 2017 23:57:45 EDT Ventricular Rate:  77 PR Interval:    QRS Duration: 114 QT Interval:  400 QTC Calculation: 453 R Axis:   61 Text Interpretation:  Sinus rhythm Borderline intraventricular conduction delay Borderline T abnormalities, anterior leads No significant change was found Confirmed by Paula LibraMolpus, [place] ([id number]) on 11/18/2017 12:00:59 AM      Laboratory Studies: Results for orders placed or performed during the hospital encounter of 11/17/17 (from the past 24 hour(s))  Troponin I     Status: Abnormal   Collection Time: 11/18/17 12:25 AM  Result Value Ref Range   Troponin I 0.05 (HH) <0.03 ng/mL  Basic metabolic panel     Status: Abnormal   Collection Time: 11/18/17 12:25 AM  Result Value Ref Range   Sodium 138 135 - 145 mmol/L   Potassium 2.6 (LL) 3.5 - 5.1 mmol/L   Chloride 103 101 - 111 mmol/L   CO2 24 22 - 32 mmol/L   Glucose, Bld 112 (H) 65 - 99 mg/dL   BUN 13 6 - 20 mg/dL   Creatinine, Ser 1.910.69 0.44 - 1.00 mg/dL   Calcium 9.3 8.9 - 47.810.3 mg/dL   GFR calc non Af Amer >60 >60 mL/min   GFR calc Af Amer >60 >60 mL/min   Anion gap 11 5 - 15  CBC with Differential/Platelet     Status: None   Collection Time: 11/18/17 12:25 AM  Result Value Ref Range   WBC 7.0 4.0 - 10.5 K/uL   RBC 4.42 3.87 - 5.11 MIL/uL   Hemoglobin 12.1 12.0 - 15.0 g/dL   HCT 29.537.5 62.136.0 - 30.846.0 %   MCV 84.8 78.0 - 100.0 fL   MCH 27.4 26.0 - 34.0 pg   MCHC 32.3 30.0 - 36.0 g/dL   RDW 65.715.1 84.611.5 - 96.215.5 %   Platelets 203 150 - 400 K/uL   Neutrophils Relative % 50 %   Neutro Abs 3.6 1.7 - 7.7 K/uL   Lymphocytes Relative 40 %   Lymphs Abs 2.8 0.7 - 4.0 K/uL   Monocytes Relative 7 %    Monocytes Absolute 0.5 0.1 - 1.0 K/uL   Eosinophils Relative 3 %   Eosinophils Absolute 0.2 0.0 - 0.7 K/uL   Basophils Relative 0 %   Basophils Absolute 0.0 0.0 - 0.1 K/uL  Troponin I     Status: Abnormal   Collection Time: 11/18/17  2:30 AM  Result Value Ref Range   Troponin I 0.05 (HH) <0.03  ng/mL  Potassium     Status: Abnormal   Collection Time: 11/18/17  2:30 AM  Result Value Ref Range   Potassium 3.2 (L) 3.5 - 5.1 mmol/L   Imaging Studies: No results found.  ED COURSE  Nursing notes and initial vitals signs, including pulse oximetry, reviewed.  Vitals:   11/18/17 0200 11/18/17 0215 11/18/17 0230 11/18/17 0245  BP: 117/70  114/79   Pulse: 79 81 81 85  Resp: 19 16 15 16   Temp:      TempSrc:      SpO2: 99% 100% 99% 98%  Weight:      Height:       1:56 AM Potassium repletion initiated.  We will recheck troponin three hours after initial draw.  3:18 AM Patient feeling better.  Rhythm strip reviewed and patient has had no arrhythmias or ectopy during her ED stay.  Potassium is improved.  Troponin unchanged which is reassuring.  I suspect her troponin is chronically elevated due to her hyperthyroidism.  We will have her follow-up with her endocrinologist, Dr. Everardo All, for further management.  A TSH has been sent.  PROCEDURES    ED DIAGNOSES     ICD-10-CM   1. Palpitations R00.2   2. Hypokalemia E87.6   3. Elevated troponin I level R74.8        Samiyyah Moffa, Jonny Ruiz, MD 11/18/17 [id number]

## 2017-11-18 NOTE — ED Notes (Signed)
Date and time results received: 11/18/17 0055 (use smartphrase ".now" to insert current time)  Test: troponin Critical Value: 0.05  Name of Provider Notified: Dr. Read DriversMolpus  Orders Received? Or Actions Taken?: awaiting further orders

## 2017-11-18 NOTE — ED Notes (Signed)
Date and time results received: 11/18/17 0054 (use smartphrase ".now" to insert current time)  Test: potassium Critical Value: 2.6  Name of Provider Notified: Dr. Read DriversMolpus  Orders Received? Or Actions Taken?: awaiting further orders.

## 2017-11-18 NOTE — ED Notes (Signed)
Date and time results received: 11/18/17 0307 (use smartphrase ".now" to insert current time)  Test: troponin Critical Value: 0.05  Name of Provider Notified: Dr. Read DriversMolpus  Orders Received? Or Actions Taken?: no new orders

## 2017-11-22 ENCOUNTER — Telehealth: Payer: Self-pay | Admitting: Endocrinology

## 2017-11-22 ENCOUNTER — Ambulatory Visit: Payer: Self-pay | Admitting: Family Medicine

## 2017-11-23 ENCOUNTER — Inpatient Hospital Stay: Payer: Self-pay | Attending: Internal Medicine | Admitting: Internal Medicine

## 2017-11-23 ENCOUNTER — Ambulatory Visit (INDEPENDENT_AMBULATORY_CARE_PROVIDER_SITE_OTHER): Payer: Self-pay | Admitting: Family Medicine

## 2017-11-23 ENCOUNTER — Telehealth: Payer: Self-pay | Admitting: Internal Medicine

## 2017-11-23 ENCOUNTER — Encounter: Payer: Self-pay | Admitting: Internal Medicine

## 2017-11-23 ENCOUNTER — Encounter: Payer: Self-pay | Admitting: Family Medicine

## 2017-11-23 ENCOUNTER — Inpatient Hospital Stay: Payer: Self-pay

## 2017-11-23 VITALS — BP 123/56 | HR 78 | Temp 98.7°F | Resp 18 | Ht 69.0 in | Wt 218.7 lb

## 2017-11-23 VITALS — BP 116/68 | HR 78 | Temp 98.1°F | Ht 69.0 in | Wt 220.0 lb

## 2017-11-23 DIAGNOSIS — E0590 Thyrotoxicosis, unspecified without thyrotoxic crisis or storm: Secondary | ICD-10-CM

## 2017-11-23 DIAGNOSIS — R002 Palpitations: Secondary | ICD-10-CM | POA: Insufficient documentation

## 2017-11-23 DIAGNOSIS — E876 Hypokalemia: Secondary | ICD-10-CM

## 2017-11-23 DIAGNOSIS — I10 Essential (primary) hypertension: Secondary | ICD-10-CM | POA: Insufficient documentation

## 2017-11-23 DIAGNOSIS — D696 Thrombocytopenia, unspecified: Secondary | ICD-10-CM

## 2017-11-23 DIAGNOSIS — R748 Abnormal levels of other serum enzymes: Secondary | ICD-10-CM

## 2017-11-23 DIAGNOSIS — E119 Type 2 diabetes mellitus without complications: Secondary | ICD-10-CM | POA: Insufficient documentation

## 2017-11-23 DIAGNOSIS — R778 Other specified abnormalities of plasma proteins: Secondary | ICD-10-CM

## 2017-11-23 DIAGNOSIS — E039 Hypothyroidism, unspecified: Secondary | ICD-10-CM | POA: Insufficient documentation

## 2017-11-23 DIAGNOSIS — D693 Immune thrombocytopenic purpura: Secondary | ICD-10-CM | POA: Insufficient documentation

## 2017-11-23 DIAGNOSIS — R7989 Other specified abnormal findings of blood chemistry: Secondary | ICD-10-CM

## 2017-11-23 LAB — CMP (CANCER CENTER ONLY)
ALK PHOS: 65 U/L (ref 40–150)
ALT: 22 U/L (ref 0–55)
AST: 13 U/L (ref 5–34)
Albumin: 4.2 g/dL (ref 3.5–5.0)
Anion gap: 9 (ref 3–11)
BUN: 10 mg/dL (ref 7–26)
CALCIUM: 10.6 mg/dL — AB (ref 8.4–10.4)
CHLORIDE: 104 mmol/L (ref 98–109)
CO2: 25 mmol/L (ref 22–29)
CREATININE: 0.86 mg/dL (ref 0.60–1.10)
GFR, Estimated: >60 mL/min (ref >60)
Glucose, Bld: 99 mg/dL (ref 70–140)
Potassium: 3.9 mmol/L (ref 3.5–5.1)
SODIUM: 138 mmol/L (ref 136–145)
Total Bilirubin: 0.7 mg/dL (ref 0.2–1.2)
Total Protein: 8.2 g/dL (ref 6.4–8.3)

## 2017-11-23 LAB — CBC WITH DIFFERENTIAL (CANCER CENTER ONLY)
BASOS PCT: 0 %
Basophils Absolute: 0 10*3/uL (ref 0.0–0.1)
EOS ABS: 0.1 10*3/uL (ref 0.0–0.5)
EOS PCT: 1 %
HCT: 35.4 % (ref 34.8–46.6)
Hemoglobin: 11.4 g/dL — ABNORMAL LOW (ref 11.6–15.9)
LYMPHS ABS: 2.7 10*3/uL (ref 0.9–3.3)
Lymphocytes Relative: 41 %
MCH: 27.3 pg (ref 25.1–34.0)
MCHC: 32.2 g/dL (ref 31.5–36.0)
MCV: 84.9 fL (ref 79.5–101.0)
MONOS PCT: 8 %
Monocytes Absolute: 0.6 10*3/uL (ref 0.1–0.9)
Neutro Abs: 3.3 10*3/uL (ref 1.5–6.5)
Neutrophils Relative %: 50 %
PLATELETS: 197 10*3/uL (ref 145–400)
RBC: 4.17 MIL/uL (ref 3.70–5.45)
RDW: 15.3 % — AB (ref 11.2–14.5)
WBC Count: 6.6 10*3/uL (ref 3.9–10.3)

## 2017-11-23 NOTE — Progress Notes (Signed)
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  CURRENT THERAPY: Observation.  INTERVAL HISTORY: Kellie Shaffer 27 y.o. female returns to the clinic today for follow-up visit.  The patient is feeling fine today with no specific complaints except for heart palpitation.  She was seen by her endocrinologist and her thyroid levels were fine.  She was seen also by her primary care physician and referred to cardiology for evaluation.  The patient denied having any other chest pain, shortness of breath, cough or hemoptysis.  She denied having any fever or chills.  She has no nausea, vomiting, diarrhea or constipation.  She denied having any bleeding issues.  She is here today for evaluation and repeat blood work.   MEDICAL HISTORY: Past Medical History:  Diagnosis Date  . Abortion May 2013  . Anemia   . Atrial fibrillation (HCC)   . Diabetes mellitus without complication (HCC)   . History of ITP   . Hypercholesteremia   . Hypertension   . Hyperthyroidism   . Iron deficiency anemia 09/04/2016  . Ovarian cyst     ALLERGIES:  is allergic to benadryl [diphenhydramine hcl] and chloroxine.  MEDICATIONS:  Current Outpatient Medications  Medication Sig Dispense Refill  . atorvastatin (LIPITOR) 40 MG tablet Take 1 tablet (40 mg total) by mouth daily. 90 tablet 3  . Biotin 8119110000 MCG TABS Take 1,000 mcg by mouth daily.     . busPIRone (BUSPAR) 10 MG tablet Take 1 tablet (10 mg total) by mouth 3 (three) times daily. 90 tablet 1  . cetirizine (ZYRTEC) 10 MG tablet Take 1 tablet (10 mg total) by mouth daily as needed for allergies. 90 tablet 1  . hydrochlorothiazide (HYDRODIURIL) 25 MG tablet Take 1 tablet (25 mg total) by mouth  daily. 90 tablet 1  . IRON PO Take 1 tablet by mouth daily.     Marland Kitchen. lisinopril (PRINIVIL,ZESTRIL) 5 MG tablet Take 1 tablet (5 mg total) by mouth daily. 90 tablet 1  . methimazole (TAPAZOLE) 10 MG tablet Take 1 tablet (10 mg total) by mouth daily. 90 tablet 1  . metoprolol tartrate (LOPRESSOR) 25 MG tablet Take 0.5 tablets (12.5 mg total) by mouth every morning. 90 tablet 1  . potassium chloride SA (K-DUR,KLOR-CON) 20 MEQ tablet Take 1 tablet (20 mEq total) by mouth 2 (two) times daily. 10 tablet 0   No current facility-administered medications for this visit.     SURGICAL HISTORY:  Past Surgical History:  Procedure Laterality Date  . APPENDECTOMY    . LAPAROSCOPIC APPENDECTOMY N/A 01/14/2017   Procedure: APPENDECTOMY LAPAROSCOPIC;  Surgeon: Claud KelpIngram, Haywood, MD;  Location: [facility];  Service: General;  Laterality: N/A;    REVIEW OF SYSTEMS:  A comprehensive review of systems was negative except for: Cardiovascular: positive for palpitations   PHYSICAL EXAMINATION: General appearance: alert, cooperative, appears stated age and no distress Head: Normocephalic, without obvious abnormality, atraumatic Neck: no adenopathy, no JVD, supple, symmetrical, trachea midline and thyroid not enlarged, symmetric, no tenderness/mass/nodules Lymph nodes: Cervical, supraclavicular, and axillary nodes normal. Resp: clear to auscultation bilaterally Back: symmetric, no curvature. ROM normal. No CVA tenderness. Cardio: regular rate and rhythm, S1, S2 normal, no murmur, click, rub or gallop GI: soft, non-tender; bowel  sounds normal; no masses,  no organomegaly Extremities: extremities normal, atraumatic, no cyanosis or edema  ECOG PERFORMANCE STATUS: 1 - Symptomatic but completely ambulatory  Blood pressure (!) 123/56, pulse 78, temperature 98.7 F (37.1 C), temperature source Oral, resp. rate 18, height 5\' 9"  (1.753 m), weight 218 lb 11.2 oz (99.2 kg), last menstrual period 11/03/2017, SpO2 100  %.  LABORATORY DATA: Lab Results  Component Value Date   WBC 6.6 11/23/2017   HGB 12.1 11/18/2017   HCT 35.4 11/23/2017   MCV 84.9 11/23/2017   PLT 197 11/23/2017      Chemistry      Component Value Date/Time   NA 138 11/18/2017 0025   NA 144 09/10/2017 1050   NA 132 (L) 08/13/2017 1044   K 3.2 (L) 11/18/2017 0230   K 4.3 08/13/2017 1044   CL 103 11/18/2017 0025   CO2 24 11/18/2017 0025   CO2 25 08/13/2017 1044   BUN 13 11/18/2017 0025   BUN 7 09/10/2017 1050   BUN 18.6 08/13/2017 1044   CREATININE 0.69 11/18/2017 0025   CREATININE 1.3 (H) 08/13/2017 1044   GLU 517 (H) 08/13/2017 1257      Component Value Date/Time   CALCIUM 9.3 11/18/2017 0025   CALCIUM 9.9 08/13/2017 1044   ALKPHOS 57 09/10/2017 1050   ALKPHOS 100 08/13/2017 1044   AST 11 09/10/2017 1050   AST 5 08/13/2017 1044   ALT 17 09/10/2017 1050   ALT 15 08/13/2017 1044   BILITOT 0.2 09/10/2017 1050   BILITOT 0.52 08/13/2017 1044       RADIOGRAPHIC STUDIES: No results found.  ASSESSMENT AND PLAN: This is a very pleasant 27 years old African-American female with: 1) idiopathic thrombocytopenic purpura: She was treated a few months ago with a taper dose of prednisone because of the significant thrombocytopenia.  She is feeling much better today.  Her platelets count has significantly improved.  Her platelets count today are 197,000. I recommended for the patient to continue on observation with repeat CBC  in 3 months.  2) hyperthyroidism: She resumed her treatment with methimazole.  She will continue her routine follow-up visit by her endocrinologist.  3) cardiac palpitation: She was referred to cardiology by her primary care physician for evaluation.  The patient was advised to call immediately if she has any concerning symptoms in the interval.  The patient voices understanding of current disease status and treatment options and is in agreement with the current care plan.  All questions were  answered. The patient knows to call the clinic with any problems, questions or concerns. We can certainly see the patient much sooner if necessary.  Disclaimer: This note was dictated with voice recognition software. Similar sounding words can inadvertently be transcribed and may not be corrected upon review.

## 2017-11-23 NOTE — Patient Instructions (Addendum)
You will be notified of any abnormal labs. Continue Potassium replacement as ordered.      Palpitations A palpitation is the feeling that your heart:  Has an uneven (irregular) heartbeat.  Is beating faster than normal.  Is fluttering.  Is skipping a beat.  This is usually not a serious problem. In some cases, you may need more medical tests. Follow these instructions at home:  Avoid: ? Caffeine in coffee, tea, soft drinks, diet pills, and energy drinks. ? Chocolate. ? Alcohol.  Do not use any tobacco products. These include cigarettes, chewing tobacco, and e-cigarettes. If you need help quitting, ask your doctor.  Try to reduce your stress. These things may help: ? Yoga. ? Meditation. ? Physical activity. Swimming, jogging, and walking are good choices. ? A method that helps you use your mind to control things in your body, like heartbeats (biofeedback).  Get plenty of rest and sleep.  Take over-the-counter and prescription medicines only as told by your doctor.  Keep all follow-up visits as told by your doctor. This is important. Contact a doctor if:  Your heartbeat is still fast or uneven after 24 hours.  Your palpitations occur more often. Get help right away if:  You have chest pain.  You feel short of breath.  You have a very bad headache.  You feel dizzy.  You pass out (faint). This information is not intended to replace advice given to you by your health care provider. Make sure you discuss any questions you have with your health care provider. Document Released: 05/12/2008 Document Revised: 01/09/2016 Document Reviewed: 04/18/2015 Elsevier Interactive Patient Education  [employer]2018 Elsevier Inc.

## 2017-11-23 NOTE — Telephone Encounter (Signed)
Appts scheduled AVS/Calendar printed per 4/9 los °

## 2017-11-23 NOTE — Progress Notes (Signed)
Patient ID: Kellie RockerMarkysha M Daugherty, female    DOB: 10-Feb-1991, 27 y.o.   MRN: [id number]  PCP: Bing NeighborsHarris, Phila Shoaf S, FNP  Chief Complaint  Patient presents with  . Hospitalization Follow-up    Subjective:  HPI Kellie Shaffer is a 27 y.o. female with a history of anxiety, diabetes, hyperthyroidism, presents ED follow-up.   Etter SjogrenMarkysha recently presented to the ED with a complaint of palpations and was found to have slightly elevated troponin level x 2 and hypokalemia 2.8. Potassium was replaced. EKG was significant for  SR with conduction delay and borderline T abnormalities. She has no known history of cardiovascular abnormalities. No prior evaluation by cardiologist. She is followed by endocrinology for hyperthyroidism which the provider in the ED attributed to the elevation in Troponin level. Palpitations occur regardless of activity. She feels palpation occur more during the night. Reports associated dizziness and "gravity increases and difficult to move or engage in activity" Longest episode 45 minute to hour. Wakening at night due palpations. Last episode this morning. Reports no pulse rates 100 or greater. Characterizes palpitations as pounding.  During recent hospitalization her potassium dropped to 2.8 and she is at present taking potassium replacement 20 mEq twice daily and currently has approximately a 5 doses remaining.She continues to deny chest pain, chest tightness, or shortness of breath. Social History   Socioeconomic History  . Marital status: Single    Spouse name: Not on file  . Number of children: Not on file  . Years of education: 414  . Highest education level: Not on file  Occupational History  . 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 Financial resource strain: Not on file  . Food insecurity:    Worry: Not on file    Inability: Not on file  . Transportation needs:    Medical: Not on file    Non-medical: Not on file  Tobacco Use  . Smoking status: Current  Every Day Smoker    Packs/day: 0.25    Types: Cigarettes  . Smokeless tobacco: Never Used  Substance and Sexual Activity  . Alcohol use: Yes    Alcohol/week: 0.0 oz    Comment: 2-3 times a month   . Drug use: No  . Sexual activity: Yes    Birth control/protection: Condom  Lifestyle  . Physical activity:    Days per week: Not on file    Minutes per session: Not on file  . Stress: Not on file  Relationships  . Social connections:    Talks on phone: Not on file    Gets together: Not on file    Attends religious service: Not on file    Active member of club or organization: Not on file    Attends meetings of clubs or organizations: Not on file    Relationship status: Not on file  . Intimate partner violence:    Fear of current or ex partner: Not on file    Emotionally abused: Not on file    Physically abused: Not on file    Forced sexual activity: Not on file  Other Topics Concern  . Not on file  Social History Narrative   Regular exercise-yes   Caffeine Use-yes          Family History  Problem Relation Age of Onset  . Diabetes Other        Parent  . Cancer Other        Breast Cancer-Parent  . Hypertension Other  Parent  . Hyperlipidemia Other        Parent  . Arthritis Other        Parent   Review of Systems Pertinent negatives listed in HPI Patient Active Problem List   Diagnosis Date Noted  . DKA (diabetic ketoacidoses) (HCC) 08/27/2017  . Hyperglycemia 08/13/2017  . Oral candidiasis 08/13/2017  . Hypokalemia 07/19/2017  . Acute appendicitis 01/14/2017  . Iron deficiency anemia 09/04/2016  . Atrial flutter (HCC) 07/05/2014  . Hyperthyroidism 07/05/2014  . Thrombocytopenia (HCC) 05/27/2014  . Acute appendicitis, uncomplicated 05/26/2014  . Hypercholesteremia     Allergies  Allergen Reactions  . Benadryl [Diphenhydramine Hcl] Anaphylaxis and Hives  . Chloroxine Hives    Clorox    Prior to Admission medications   Medication Sig Start Date  End Date Taking? Authorizing Provider  atorvastatin (LIPITOR) 40 MG tablet Take 1 tablet (40 mg total) by mouth daily. 09/10/17  Yes Bing Neighbors, FNP  Biotin 11914 MCG TABS Take 1,000 mcg by mouth daily.    Yes [provider]  busPIRone (BUSPAR) 10 MG tablet Take 1 tablet (10 mg total) by mouth 3 (three) times daily. 11/02/17  Yes Bing Neighbors, FNP  cetirizine (ZYRTEC) 10 MG tablet Take 1 tablet (10 mg total) by mouth daily as needed for allergies. 09/10/17  Yes Bing Neighbors, FNP  hydrochlorothiazide (HYDRODIURIL) 25 MG tablet Take 1 tablet (25 mg total) by mouth daily. 09/10/17  Yes Bing Neighbors, FNP  IRON PO Take 1 tablet by mouth daily.    Yes [provider]  lisinopril (PRINIVIL,ZESTRIL) 5 MG tablet Take 1 tablet (5 mg total) by mouth daily. 09/10/17  Yes Bing Neighbors, FNP  methimazole (TAPAZOLE) 10 MG tablet Take 1 tablet (10 mg total) by mouth daily. 10/14/17  Yes Romero Belling, MD  metoprolol tartrate (LOPRESSOR) 25 MG tablet Take 0.5 tablets (12.5 mg total) by mouth every morning. 09/10/17  Yes Bing Neighbors, FNP  potassium chloride SA (K-DUR,KLOR-CON) 20 MEQ tablet Take 1 tablet (20 mEq total) by mouth 2 (two) times daily. 11/18/17  Yes Molpus, John, MD    Past Medical, Surgical Family and Social History reviewed and updated.    Objective:   Today's Vitals   11/23/17 0912  BP: 116/68  Pulse: 78  Temp: 98.1 F (36.7 C)  TempSrc: Oral  SpO2: 100%  Weight: 220 lb (99.8 kg)  Height: 5\' 9"  (1.753 m)    Wt Readings from Last 3 Encounters:  11/23/17 220 lb (99.8 kg)  11/17/17 220 lb (99.8 kg)  11/02/17 218 lb (98.9 kg)    Physical Exam Constitutional: Patient appears well-developed and well-nourished. No distress. HENT: Normocephalic, atraumatic, External right and left ear normal.  Eyes: Conjunctivae and EOM are normal. PERRLA, no scleral icterus. Neck: Normal ROM. Neck supple. No JVD. No tracheal deviation. No  thyromegaly. CVS: RRR, S1/S2 +, no murmurs, no gallops, no carotid bruit.  Pulmonary: Effort and breath sounds normal, no stridor, rhonchi, wheezes, rales.  Abdominal: Soft. BS +, no distension, tenderness, rebound or guarding.  Musculoskeletal: Normal range of motion. No edema and no tenderness.  Neuro: Alert. Normal reflexes, muscle tone coordination. No cranial nerve deficit. Skin: Skin is warm and dry. No rash noted. Not diaphoretic. No erythema. No pallor. Psychiatric: Appears anxious. Normal affect. Behavior, judgment, thought content normal.   Assessment & Plan:  1. Palpitation 2. Elevated troponin Currently asymptomatic of chest pain or palpitations. Given patients persistent palpitations, recent elevated troponin level,  a cardiology consult is warranted to further evaluate underlying cause of symptoms. Last TSH normal 4.48. Rechecking potassium today.  3. Hypokalemia, complete remainder of potassium replacement. Checking a potassium level today.  RTC: 6 weeks for scheduled diabetes follow-up   Godfrey Pick. Tiburcio Pea, MSN, FNP-C [facility]  [address] [place], [place] [address] [phone]

## 2017-11-24 LAB — POTASSIUM: Potassium: 4.1 mmol/L (ref 3.5–5.2)

## 2017-11-24 LAB — MAGNESIUM: MAGNESIUM: 2.2 mg/dL (ref 1.6–2.3)

## 2017-12-02 MED FILL — LISINOPRIL 5 MG TAB: 5 | 30 days supply | Qty: 30 | Fill #2

## 2017-12-10 MED FILL — ATORVASTATIN 40 MG TABLET: 40 | 30 days supply | Qty: 30 | Fill #3

## 2017-12-10 MED FILL — HYDROCHLOROTHIAZIDE 25 MG T: 25 | 30 days supply | Qty: 30 | Fill #3

## 2017-12-10 MED FILL — METOPROLOL TARTRATE 25 MG T: 25 | 30 days supply | Qty: 15 | Fill #3

## 2017-12-21 ENCOUNTER — Ambulatory Visit (INDEPENDENT_AMBULATORY_CARE_PROVIDER_SITE_OTHER): Payer: Self-pay | Admitting: Endocrinology

## 2017-12-21 ENCOUNTER — Encounter: Payer: Self-pay | Admitting: Endocrinology

## 2017-12-21 VITALS — BP 124/70 | HR 82 | Wt 222.0 lb

## 2017-12-21 DIAGNOSIS — E0590 Thyrotoxicosis, unspecified without thyrotoxic crisis or storm: Secondary | ICD-10-CM

## 2017-12-21 NOTE — Patient Instructions (Addendum)
Please continue the same methimazole. It is best to never miss it. However, if you do miss a pill, take 2 the next day.  Please come back for a follow-up appointment in 3 months.  If ever you have fever while taking methimazole, stop it and call us, even if the reason is obvious, because of the risk of a rare side-effect.

## 2017-12-21 NOTE — Progress Notes (Signed)
Subjective:    Patient ID: Kellie Shaffer, female    DOB: May 14, 1991, 27 y.o.   MRN: [id number]  HPI Pt returns for f/u of hyperthyroidism (dx'ed 2013, during a pregnancy; US showed a small multinodular goiter, but she also has proptosis; she was then rx'ed with tapazole; she was lost to f/u, but hyperthyroidism was found again during a hospitalization in late 2015 for appendicitis; she was restarted on tapazole then; pt says she is not at risk for another pregnancy; f/u TFT in 2018 were normal off rx; later in 2018, it was resumed due to suppressed TSH).   She says she never misses the methimazole.  Palpitations are less frequent now.   Past Medical History:  Diagnosis Date  . Abortion May 2013  . Anemia   . Atrial fibrillation (HCC)   . Diabetes mellitus without complication (HCC)   . History of ITP   . Hypercholesteremia   . Hypertension   . Hyperthyroidism   . Iron deficiency anemia 09/04/2016  . Ovarian cyst     Past Surgical History:  Procedure Laterality Date  . APPENDECTOMY    . LAPAROSCOPIC APPENDECTOMY N/A 01/14/2017   Procedure: APPENDECTOMY LAPAROSCOPIC;  Surgeon: Claud Kelp, MD;  Location: [facility];  Service: General;  Laterality: N/A;    Social History   Socioeconomic History  . Marital status: Single    Spouse name: Not on file  . Number of children: Not on file  . Years of education: 58  . Highest education level: Not on file  Occupational History  . 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 Financial resource strain: Not on file  . Food insecurity:    Worry: Not on file    Inability: Not on file  . Transportation needs:    Medical: Not on file    Non-medical: Not on file  Tobacco Use  . Smoking status: Current Every Day Smoker    Packs/day: 0.25    Types: Cigarettes  . Smokeless tobacco: Never Used  Substance and Sexual Activity  . Alcohol use: Yes    Alcohol/week: 0.0 oz    Comment: 2-3 times a month   . Drug use: No  . Sexual  activity: Yes    Birth control/protection: Condom  Lifestyle  . Physical activity:    Days per week: Not on file    Minutes per session: Not on file  . Stress: Not on file  Relationships  . Social connections:    Talks on phone: Not on file    Gets together: Not on file    Attends religious service: Not on file    Active member of club or organization: Not on file    Attends meetings of clubs or organizations: Not on file    Relationship status: Not on file  . Intimate partner violence:    Fear of current or ex partner: Not on file    Emotionally abused: Not on file    Physically abused: Not on file    Forced sexual activity: Not on file  Other Topics Concern  . Not on file  Social History Narrative   Regular exercise-yes   Caffeine Use-yes          Current Outpatient Medications on File Prior to Visit  Medication Sig Dispense Refill  . atorvastatin (LIPITOR) 40 MG tablet Take 1 tablet (40 mg total) by mouth daily. 90 tablet 3  . Biotin 40981 MCG TABS Take 1,000 mcg by mouth daily.     Marland Kitchen  cetirizine (ZYRTEC) 10 MG tablet Take 1 tablet (10 mg total) by mouth daily as needed for allergies. 90 tablet 1  . hydrochlorothiazide (HYDRODIURIL) 25 MG tablet Take 1 tablet (25 mg total) by mouth daily. 90 tablet 1  . IRON PO Take 1 tablet by mouth daily.     Marland Kitchen lisinopril (PRINIVIL,ZESTRIL) 5 MG tablet Take 1 tablet (5 mg total) by mouth daily. 90 tablet 1  . methimazole (TAPAZOLE) 10 MG tablet Take 1 tablet (10 mg total) by mouth daily. 90 tablet 1  . metoprolol tartrate (LOPRESSOR) 25 MG tablet Take 0.5 tablets (12.5 mg total) by mouth every morning. 90 tablet 1  . potassium chloride SA (K-DUR,KLOR-CON) 20 MEQ tablet Take 1 tablet (20 mEq total) by mouth 2 (two) times daily. 10 tablet 0  . busPIRone (BUSPAR) 10 MG tablet Take 1 tablet (10 mg total) by mouth 3 (three) times daily. (Patient not taking: Reported on 12/21/2017) 90 tablet 1   No current facility-administered medications on  file prior to visit.     Allergies  Allergen Reactions  . Benadryl [Diphenhydramine Hcl] Anaphylaxis and Hives  . Chloroxine Hives    Clorox    Family History  Problem Relation Age of Onset  . Diabetes Other        Parent  . Cancer Other        Breast Cancer-Parent  . Hypertension Other        Parent  . Hyperlipidemia Other        Parent  . Arthritis Other        Parent    BP 124/70   Pulse 82   Wt 222 lb (100.7 kg)   SpO2 99%   BMI 32.78 kg/m   Review of Systems Denies fever.     Objective:   Physical Exam VITAL SIGNS:  See vs page GENERAL: no distress eyes: no periorbital swelling; slight bilat proptosis  NECK: There is no palpable thyroid enlargement.  No thyroid nodule is palpable.  No palpable lymphadenopathy at the anterior neck.    Lab Results  Component Value Date   TSH 4.488 11/18/2017      Assessment & Plan:  Hyperthyroidism: well-controlled AF: she needs to maintain euthyroidism  Patient Instructions  Please continue the same methimazole. It is best to never miss it. However, if you do miss a pill, take 2 the next day.  Please come back for a follow-up appointment in 3 months.  If ever you have fever while taking methimazole, stop it and call us, even if the reason is obvious, because of the risk of a rare side-effect.

## 2017-12-22 MED FILL — methIMAzole 10 MG TABS: 10 | 30 days supply | Qty: 60 | Fill #2

## 2017-12-27 ENCOUNTER — Ambulatory Visit: Payer: Self-pay

## 2017-12-31 ENCOUNTER — Ambulatory Visit: Payer: Self-pay | Admitting: Family Medicine

## 2018-01-03 ENCOUNTER — Ambulatory Visit (INDEPENDENT_AMBULATORY_CARE_PROVIDER_SITE_OTHER): Payer: Self-pay | Admitting: Family Medicine

## 2018-01-03 ENCOUNTER — Encounter: Payer: Self-pay | Admitting: Family Medicine

## 2018-01-03 VITALS — BP 120/60 | HR 84 | Temp 98.0°F | Ht 69.0 in | Wt 226.0 lb

## 2018-01-03 DIAGNOSIS — E119 Type 2 diabetes mellitus without complications: Secondary | ICD-10-CM

## 2018-01-03 DIAGNOSIS — R002 Palpitations: Secondary | ICD-10-CM

## 2018-01-03 DIAGNOSIS — H539 Unspecified visual disturbance: Secondary | ICD-10-CM

## 2018-01-03 DIAGNOSIS — E876 Hypokalemia: Secondary | ICD-10-CM

## 2018-01-03 DIAGNOSIS — Z09 Encounter for follow-up examination after completed treatment for conditions other than malignant neoplasm: Secondary | ICD-10-CM

## 2018-01-03 DIAGNOSIS — F419 Anxiety disorder, unspecified: Secondary | ICD-10-CM

## 2018-01-03 DIAGNOSIS — E0590 Thyrotoxicosis, unspecified without thyrotoxic crisis or storm: Secondary | ICD-10-CM

## 2018-01-03 DIAGNOSIS — Z124 Encounter for screening for malignant neoplasm of cervix: Secondary | ICD-10-CM

## 2018-01-03 LAB — POCT URINALYSIS DIP (MANUAL ENTRY)
Bilirubin, UA: NEGATIVE
Blood, UA: NEGATIVE
Glucose, UA: NEGATIVE mg/dL
Ketones, POC UA: NEGATIVE mg/dL
Nitrite, UA: NEGATIVE
Protein Ur, POC: NEGATIVE mg/dL
Spec Grav, UA: 1.02 (ref 1.010–1.025)
Urobilinogen, UA: 0.2 E.U./dL
pH, UA: 5.5 (ref 5.0–8.0)

## 2018-01-03 LAB — POCT GLYCOSYLATED HEMOGLOBIN (HGB A1C): Hemoglobin A1C: 5.6 % (ref 4.0–5.6)

## 2018-01-03 LAB — GLUCOSE, POCT (MANUAL RESULT ENTRY): POC Glucose: 130 mg/dl — AB (ref 70–99)

## 2018-01-03 MED ORDER — POTASSIUM CHLORIDE CRYS ER 20 MEQ PO TBCR: 20.0000 meq | EXTENDED_RELEASE_TABLET | Freq: Every day | ORAL | 0 refills | Status: DC

## 2018-01-03 NOTE — Progress Notes (Signed)
Subjective:     Patient ID: Kellie Shaffer, female   DOB: [birth year], 27 y.o.   MRN: [id number]   PCP: Raliegh Ip, NP  Current Status: Pap smear today.   HPI Since her last office visit, patient states that she has been feeling well with no complaints. She denies fevers, fatigue, recent infections, weight loss, and night sweats. She has had no incidents of chest pain, heart palpitations, cough, and shortness of breath.   She states that she is currently taking all medications as directed.   She states that she has not visited Optometry as of yet, but she will make any appointment as soon as possible. She denies headaches, dizziness, dysuria, and polyuria.   She has a good appetite. Her stools are normal.  She denies abdominal pain, nausea, vomiting, diarrhea, and constipation. She denies any episodes of bleeding.   She has not had any issues with anxiety or depression.  She denies pain today.   Past Medical History:  Diagnosis Date  . Abortion May 2013  . Anemia   . Atrial fibrillation (HCC)   . Diabetes mellitus without complication (HCC)   . History of ITP   . Hypercholesteremia   . Hypertension   . Hyperthyroidism   . Iron deficiency anemia 09/04/2016  . Ovarian cyst     Social History   Socioeconomic History  . Marital status: Single    Spouse name: Not on file  . Number of children: Not on file  . Years of education: 48  . Highest education level: Not on file  Occupational History  . 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 Financial resource strain: Not on file  . Food insecurity:    Worry: Not on file    Inability: Not on file  . Transportation needs:    Medical: Not on file    Non-medical: Not on file  Tobacco Use  . Smoking status: Current Every Day Smoker    Packs/day: 0.25    Types: Cigarettes  . Smokeless tobacco: Never Used  Substance and Sexual Activity  . Alcohol use: Yes    Alcohol/week: 0.0 oz    Comment: 2-3 times a month   .  Drug use: No  . Sexual activity: Yes    Birth control/protection: Condom  Lifestyle  . Physical activity:    Days per week: Not on file    Minutes per session: Not on file  . Stress: Not on file  Relationships  . Social connections:    Talks on phone: Not on file    Gets together: Not on file    Attends religious service: Not on file    Active member of club or organization: Not on file    Attends meetings of clubs or organizations: Not on file    Relationship status: Not on file  . Intimate partner violence:    Fear of current or ex partner: Not on file    Emotionally abused: Not on file    Physically abused: Not on file    Forced sexual activity: Not on file  Other Topics Concern  . Not on file  Social History Narrative   Regular exercise-yes   Caffeine Use-yes          Immunization History  Administered Date(s) Administered  . Pneumococcal-Unspecified 08/17/2009  . Tdap 08/17/2010    Current Meds  Medication Sig  . atorvastatin (LIPITOR) 40 MG tablet Take 1 tablet (40 mg total) by mouth daily.  . Biotin  10000 MCG TABS Take 1,000 mcg by mouth daily.   . cetirizine (ZYRTEC) 10 MG tablet Take 1 tablet (10 mg total) by mouth daily as needed for allergies.  . hydrochlorothiazide (HYDRODIURIL) 25 MG tablet Take 1 tablet (25 mg total) by mouth daily.  . IRON PO Take 1 tablet by mouth daily.   Marland Kitchen lisinopril (PRINIVIL,ZESTRIL) 5 MG tablet Take 1 tablet (5 mg total) by mouth daily.  . methimazole (TAPAZOLE) 10 MG tablet Take 1 tablet (10 mg total) by mouth daily.  . metoprolol tartrate (LOPRESSOR) 25 MG tablet Take 0.5 tablets (12.5 mg total) by mouth every morning.  . potassium chloride SA (K-DUR,KLOR-CON) 20 MEQ tablet Take 1 tablet (20 mEq total) by mouth daily.  . [DISCONTINUED] potassium chloride SA (K-DUR,KLOR-CON) 20 MEQ tablet Take 1 tablet (20 mEq total) by mouth 2 (two) times daily.    Allergies  Allergen Reactions  . Benadryl [Diphenhydramine Hcl] Anaphylaxis  and Hives  . Chloroxine Hives    Clorox    Review of Systems  Constitutional: Negative.   HENT: Negative.   Eyes: Negative.   Respiratory: Negative.   Cardiovascular: Negative.   Gastrointestinal: Negative.   Endocrine: Negative.   Genitourinary: Negative.   Musculoskeletal: Negative.   Skin: Negative.   Allergic/Immunologic: Negative.   Neurological: Negative.   Hematological: Negative.   Psychiatric/Behavioral: Negative.       Objective:  Physical Exam  Constitutional: She is oriented to person, place, and time. She appears well-developed and well-nourished.  HENT:  Head: Normocephalic and atraumatic.  Neck: Normal range of motion. Neck supple.  Cardiovascular: Normal rate, regular rhythm, normal heart sounds and intact distal pulses.  Pulmonary/Chest: Effort normal and breath sounds normal.  Abdominal: Soft. Bowel sounds are normal.  Musculoskeletal: Normal range of motion.  Neurological: She is alert and oriented to person, place, and time.  Skin: Skin is warm and dry. Capillary refill takes less than 2 seconds.  Psychiatric: She has a normal mood and affect. Her behavior is normal. Judgment and thought content normal.  Nursing note and vitals reviewed.  Assessment:   1. Type 2 diabetes mellitus without complication, without long-term current use of insulin (HCC) 2. Pap smear for cervical cancer screening 3. Palpitation 4. Anxiety 5. Vision disturbance 6. Hyperthyroidism 7. Hypokalemia 8. Follow up   Plan:   1. Type 2 diabetes mellitus without complication, without long-term current use of insulin (HCC) Stable. Urinalysis stable today, Hgb A1c within normal range today at 5.6, decreased fro 8.4 two months ago. Glucose elevated at 130 today. We will continue to monitor. Patient counseled to continue to decrease sugars and foods high fats and carbohydrates, adding more vegetables and increasing water intake.  - POCT urinalysis dipstick - POCT glycosylated  hemoglobin (Hb A1C) - POCT glucose (manual entry)  2. Pap smear for cervical cancer screening Gynecological exam performed today. Will follow up with patient if abnormalities.  - Pap IG, CT/NG w/ reflex HPV when [facility])  3. Palpitation Resolved. She has no reports.   4. Anxiety Stable. She will continue to take Buspar 10 mg as directed. Monitor.   5. Vision disturbance She will follow up with Optometry.   6. Hyperthyroidism TSH level was at normal range at 4.488 on 11/18/2017.  7. Hypokalemia Potassium level was decreased at 3.2 on 11/18/2017. We will send Rx for K+ 20 mEq daily #30 to pharmacy today. Patient advised to continue eating Potassium rich foods such as apricots, green leafy foods, and bananas as  tolerated.   8. Follow up We will follow up with her in 1 month for chronic disease management, re-assessment of K+ level, and CBC/CMET lab draws.   Meds ordered this encounter  Medications  . potassium chloride SA (K-DUR,KLOR-CON) 20 MEQ tablet    Sig: Take 1 tablet (20 mEq total) by mouth daily.    Dispense:  30 tablet    Refill:  0   Raliegh Ip, MSN, FNP-BC Patient [facility] [address] [place], [place] [address] [phone]

## 2018-01-04 MED FILL — POTASSIUM CL ER 20 MEQ TAB: 20 | 30 days supply | Qty: 30 | Fill #0

## 2018-01-06 MED FILL — LISINOPRIL 5 MG TAB: 5 | 30 days supply | Qty: 30 | Fill #3

## 2018-01-07 ENCOUNTER — Ambulatory Visit: Payer: Self-pay

## 2018-01-07 LAB — PAP IG, CT-NG, RFX HPV ASCU
Chlamydia, Nuc. Acid Amp: NEGATIVE
Gonococcus by Nucleic Acid Amp: NEGATIVE
PAP Smear Comment: 0

## 2018-01-11 MED FILL — HYDROCHLOROTHIAZIDE 25 MG T: 25 | 30 days supply | Qty: 30 | Fill #4

## 2018-01-11 MED FILL — ATORVASTATIN CALCIUM 40 MG: 40 | 30 days supply | Qty: 30 | Fill #4

## 2018-01-11 MED FILL — METOPROLOL TARTRATE 25 MG T: 25 | 30 days supply | Qty: 15 | Fill #4

## 2018-01-12 ENCOUNTER — Encounter: Payer: Self-pay | Admitting: Cardiology

## 2018-01-12 ENCOUNTER — Ambulatory Visit (INDEPENDENT_AMBULATORY_CARE_PROVIDER_SITE_OTHER): Payer: Self-pay | Admitting: Cardiology

## 2018-01-12 VITALS — BP 118/78 | HR 73 | Ht 69.0 in | Wt 225.4 lb

## 2018-01-12 DIAGNOSIS — R748 Abnormal levels of other serum enzymes: Secondary | ICD-10-CM

## 2018-01-12 DIAGNOSIS — R7989 Other specified abnormal findings of blood chemistry: Secondary | ICD-10-CM

## 2018-01-12 DIAGNOSIS — R778 Other specified abnormalities of plasma proteins: Secondary | ICD-10-CM

## 2018-01-12 DIAGNOSIS — R002 Palpitations: Secondary | ICD-10-CM

## 2018-01-12 DIAGNOSIS — E876 Hypokalemia: Secondary | ICD-10-CM

## 2018-01-12 NOTE — Patient Instructions (Signed)
Medication instructions  stop taking HCTZ   LABS  BMP - TODAY  DEPENDING ON RESULTS YOU NMAY POSSIBLE STOP TAKING POTASSIUM   WILL SCHEDULE AT [address] Your physician has recommended that you wear an event monitor 14 DAYS. Event monitors are medical devices that record the heart's electrical activity. Doctors most often [place] these monitors to diagnose arrhythmias. Arrhythmias are problems with the speed or rhythm of the heartbeat. The monitor is a small, portable device. You can wear one while you do your normal daily activities. This is usually used to diagnose what is causing palpitations/syncope (passing out).    Your physician recommends that you schedule a follow-up appointment in 4 - 6 WEEKS  WITH DR HARDING.

## 2018-01-12 NOTE — Progress Notes (Signed)
PCP: Bing Neighbors, FNP  Clinic Note: Chief Complaint  Patient presents with  . New Patient (Initial Visit)    states some dizziness    HPI: Kellie Shaffer is a 27 y.o. female who is being seen today for the evaluation of palpitations (with elevated Troponin) at the request of Bing Neighbors, FNP.  Kellie Shaffer was seen on April 9 by Ms. Harris following an ER visit on November 17, 2017  Recent Hospitalizations:   October 08, 2017: Dizziness and palpitations  November 17, 2017--Noted to have hypokalemia, repleted.  Troponin was 0.05 and stable..  Studies Personally Reviewed - (if available, images/films reviewed: From Epic Chart or Care Everywhere)  None  Interval History: Kellie Shaffer presents here today Kellie Shaffer really almost more than anything feeling Fatigue with palpitations.  She has random episodes of irregular heart beating that is sometimes associate with doing light work around the house.  It can also occur at rest or with exertion.  The main symptom she has been she has the palpitations is a sensation of dizziness.  She says that her heart rate usually does not go above 80, but she does not always feel for her pulse when she has these spells. Denies any chest tightness or pressure with rest or exertion.  She did not have any sensation of chest tightness or pressure with these episodes just dizziness and may be a little bit short of breath.  She does not have any exertional dyspnea with routine activity and no chest pain with exertion.  She denies any PND, orthopnea or edema.  She has no lightheadedness, dizziness or syncope/near syncope.  Just the dizziness with her irregular heartbeats.  No TIA/amaurosis fugax symptoms. No melena, hematochezia, hematuria, or epstaxis. No claudication.  ROS: A comprehensive was performed. Review of Systems  Constitutional: Negative for malaise/fatigue (More than anything, somewhat deconditioned).  HENT: Negative for nosebleeds.     Genitourinary: Negative for dysuria, flank pain and hematuria.  Musculoskeletal: Negative for joint pain.  Neurological: Positive for dizziness (With palpitations spells). Negative for focal weakness.  Psychiatric/Behavioral: Negative for memory loss. The patient is nervous/anxious.   All other systems reviewed and are negative.    I have reviewed and (if needed) personally updated the patient's problem list, medications, allergies, past medical and surgical history, social and family history.   Past Medical History:  Diagnosis Date  . Abortion May 2013  . Anemia   . Atrial fibrillation (HCC)   . Diabetes mellitus without complication (HCC)   . History of ITP   . Hypercholesteremia   . Hypertension   . Hyperthyroidism   . Iron deficiency anemia 09/04/2016  . Ovarian cyst     Past Surgical History:  Procedure Laterality Date  . APPENDECTOMY    . LAPAROSCOPIC APPENDECTOMY N/A 01/14/2017   Procedure: APPENDECTOMY LAPAROSCOPIC;  Surgeon: Claud Kelp, MD;  Location: [facility];  Service: General;  Laterality: N/A;    Current Meds  Medication Sig  . atorvastatin (LIPITOR) 40 MG tablet Take 1 tablet (40 mg total) by mouth daily.  . Biotin 91478 MCG TABS Take 1,000 mcg by mouth daily.   . cetirizine (ZYRTEC) 10 MG tablet Take 1 tablet (10 mg total) by mouth daily as needed for allergies.  . IRON PO Take 1 tablet by mouth daily.   Marland Kitchen lisinopril (PRINIVIL,ZESTRIL) 5 MG tablet Take 1 tablet (5 mg total) by mouth daily.  . methimazole (TAPAZOLE) 10 MG tablet Take 1 tablet (10 mg total)  by mouth daily.  . metoprolol tartrate (LOPRESSOR) 25 MG tablet Take 0.5 tablets (12.5 mg total) by mouth every morning.  . potassium chloride SA (K-DUR,KLOR-CON) 20 MEQ tablet Take 1 tablet (20 mEq total) by mouth daily.  . [DISCONTINUED] hydrochlorothiazide (HYDRODIURIL) 25 MG tablet Take 1 tablet (25 mg total) by mouth daily.    Allergies  Allergen Reactions  . Benadryl [Diphenhydramine Hcl]  Anaphylaxis and Hives  . Chloroxine Hives    Clorox    Social History   Tobacco Use  . Smoking status: Current Every Day Smoker    Packs/day: 0.25    Types: Cigarettes  . Smokeless tobacco: Never Used  Substance Use Topics  . Alcohol use: Yes    Alcohol/week: 0.0 oz    Comment: 2-3 times a month   . Drug use: No   Social History   Social History Narrative   Regular exercise-yes   Caffeine Use-yes          family history includes Arthritis in her other; Cancer in her other; Diabetes in her other; Hyperlipidemia in her other; Hypertension in her other.  Wt Readings from Last 3 Encounters:  01/12/18 225 lb 6.4 oz (102.2 kg)  01/03/18 226 lb (102.5 kg)  12/21/17 222 lb (100.7 kg)    PHYSICAL EXAM BP 118/78 (BP Location: Right Arm)   Pulse 73   Ht  (1.753 m)   Wt 225 lb 6.4 oz (102.2 kg)   LMP 12/25/2017   BMI 33.29 kg/m  Physical Exam  Constitutional: She is oriented to person, place, and time. She appears well-developed and well-nourished. No distress.  Mildly obese.  Otherwise healthy-appearing.  Well-groomed  HENT:  Head: Normocephalic and atraumatic.  Mouth/Throat: No oropharyngeal exudate.  Eyes: Pupils are equal, round, and reactive to light. EOM are normal. No scleral icterus.  Neck: Neck supple. No JVD present.  Cardiovascular: Normal rate, regular rhythm, normal heart sounds and intact distal pulses.  No extrasystoles are present. PMI is not displaced. Exam reveals no gallop and no friction rub.  No murmur heard. Pulmonary/Chest: Effort normal and breath sounds normal. No respiratory distress. She has no wheezes.  Abdominal: Soft. Bowel sounds are normal. She exhibits no distension. There is no tenderness. There is no rebound.  No HJR HSM  Musculoskeletal: Normal range of motion. She exhibits deformity.  Neurological: She is alert and oriented to person, place, and time. No cranial nerve deficit.  Psychiatric: She has a normal mood and affect. Her  behavior is normal. Judgment and thought content normal.  Vitals reviewed.    Adult ECG Report  Rate: 73 ;  Rhythm: normal sinus rhythm and Left axis deviation.  Nonspecific ST and T wave changes.  Otherwise normal intervals and durations.;   Narrative Interpretation: Relatively normal EKG   Other studies Reviewed: Additional studies/ records that were reviewed today include:  Recent Labs:    Lab Results  Component Value Date   CHOL 216 (H) 08/31/2017   HDL 35 (L) 08/31/2017   LDLCALC 168 (H) 08/31/2017   TRIG 67 08/31/2017   CHOLHDL 6.2 08/31/2017   Lab Results  Component Value Date   CREATININE 0.68 01/12/2018   BUN 9 01/12/2018   NA 139 01/12/2018   K 4.0 01/12/2018   CL 99 01/12/2018   CO2 22 01/12/2018   Lab Results  Component Value Date   WBC 6.6 11/23/2017   HGB 11.4 (L) 11/23/2017   HCT 35.4 11/23/2017   MCV 84.9 11/23/2017  PLT 197 11/23/2017   ASSESSMENT / PLAN: Problem List Items Addressed This Visit    Palpitations - Primary    Is hard to tell what these "palpitations are "she is somewhat symptomatic when they occur, they do not last very long.  Unfortunately they are not occurring with enough frequency to just do a 24 to 48-hour monitor.  We will therefore order A 30-Day Event Monitor. We will also check a 2D Echo if there are any gross abnormalities but otherwise for now with no auscultated will abnormalities, we will hold off on an Echo.      Relevant Orders   EKG 12-Lead (Completed)   Basic metabolic panel (Completed)   CARDIAC EVENT MONITOR   Hypokalemia    Repleted in the hospital.  Very likely could be the etiology for her initial onset of palpitations.  I am not exactly sure where her potassium levels are now.  We will check today in place and note.  We will also recommend stopping HCTZ altogether we will recheck chemistry panel to ensure renal function and potassium levels are stable.      Elevated troponin    Not really sure to make a  minimal troponin elevation in the setting of weakness and dizziness etc.  If she denies any additional symptoms, she would be safe. She never had any chest tightness pressure and I will associate with her hospital stay.  I think this is probably just simply lab error with hypersensitive troponin levels.    there is no evidence of suggest any prior cardiac disease.  As such, with no active symptoms at this point I would not recommend stress test evaluation or even potentially invasive evaluation.      Relevant Orders   EKG 12-Lead (Completed)   Basic metabolic panel (Completed)       I spent a total of 35 minutes with the patient and chart review. >  50% of the time was spent in direct patient consultation.   Current medicines are reviewed at length with the patient today.  (+/- concerns)  asking if any of her medicines are related to her hospital stay The following changes have been made:  DC HCTZ, consider increasing.  Patient Instructions  Medication instructions  stop taking HCTZ   LABS  BMP - TODAY  DEPENDING ON RESULTS YOU NMAY POSSIBLE STOP TAKING POTASSIUM   WILL SCHEDULE AT [address] Your physician has recommended that you wear an event monitor 14 DAYS. Event monitors are medical devices that record the heart's electrical activity. Doctors most often [place] these monitors to diagnose arrhythmias. Arrhythmias are problems with the speed or rhythm of the heartbeat. The monitor is a small, portable device. You can wear one while you do your normal daily activities. This is usually used to diagnose what is causing palpitations/syncope (passing out).    Your physician recommends that you schedule a follow-up appointment in 4 - 6 WEEKS  WITH DR HARDING.     Studies Ordered:   Orders Placed This Encounter  Procedures  . Basic metabolic panel  . CARDIAC EVENT MONITOR  . EKG 12-Lead      Bryan Lemma, M.D., M.S. Interventional Cardiologist    Pager # [phone] Phone # [phone] [address] [place], [place] [address]   Thank you for choosing [facility]!!

## 2018-01-13 LAB — BASIC METABOLIC PANEL
BUN / CREAT RATIO: 13 (ref 9–23)
BUN: 9 mg/dL (ref 6–20)
CO2: 22 mmol/L (ref 20–29)
CREATININE: 0.68 mg/dL (ref 0.57–1.00)
Calcium: 10.3 mg/dL — ABNORMAL HIGH (ref 8.7–10.2)
Chloride: 99 mmol/L (ref 96–106)
GFR calc Af Amer: 139 mL/min/{1.73_m2} (ref >59)
GFR, EST NON AFRICAN AMERICAN: 120 mL/min/{1.73_m2} (ref >59)
Glucose: 79 mg/dL (ref 65–99)
Potassium: 4 mmol/L (ref 3.5–5.2)
Sodium: 139 mmol/L (ref 134–144)

## 2018-01-15 NOTE — Assessment & Plan Note (Signed)
Not really sure to make a minimal troponin elevation in the setting of weakness and dizziness etc.  If she denies any additional symptoms, she would be safe. She never had any chest tightness pressure and I will associate with her hospital stay.  I think this is probably just simply lab error with hypersensitive troponin levels.    there is no evidence of suggest any prior cardiac disease.  As such, with no active symptoms at this point I would not recommend stress test evaluation or even potentially invasive evaluation.

## 2018-01-15 NOTE — Assessment & Plan Note (Addendum)
Repleted in the hospital.  Very likely could be the etiology for her initial onset of palpitations.  I am not exactly sure where her potassium levels are now.  We will check today in place and note.  We will also recommend stopping HCTZ altogether we will recheck chemistry panel to ensure renal function and potassium levels are stable.

## 2018-01-15 NOTE — Assessment & Plan Note (Signed)
Is hard to tell what these "palpitations are "she is somewhat symptomatic when they occur, they do not last very long.  Unfortunately they are not occurring with enough frequency to just do a 24 to 48-hour monitor.  We will therefore order A 30-Day Event Monitor. We will also check a 2D Echo if there are any gross abnormalities but otherwise for now with no auscultated will abnormalities, we will hold off on an Echo.

## 2018-01-19 ENCOUNTER — Ambulatory Visit (INDEPENDENT_AMBULATORY_CARE_PROVIDER_SITE_OTHER): Payer: Managed Care, Other (non HMO)

## 2018-01-19 DIAGNOSIS — R002 Palpitations: Secondary | ICD-10-CM

## 2018-01-29 ENCOUNTER — Other Ambulatory Visit: Payer: Self-pay

## 2018-01-29 ENCOUNTER — Encounter (HOSPITAL_BASED_OUTPATIENT_CLINIC_OR_DEPARTMENT_OTHER): Payer: Self-pay | Admitting: *Deleted

## 2018-01-29 DIAGNOSIS — E119 Type 2 diabetes mellitus without complications: Secondary | ICD-10-CM | POA: Diagnosis not present

## 2018-01-29 DIAGNOSIS — E0590 Thyrotoxicosis, unspecified without thyrotoxic crisis or storm: Secondary | ICD-10-CM | POA: Diagnosis not present

## 2018-01-29 DIAGNOSIS — R51 Headache: Secondary | ICD-10-CM | POA: Insufficient documentation

## 2018-01-29 DIAGNOSIS — I10 Essential (primary) hypertension: Secondary | ICD-10-CM | POA: Diagnosis not present

## 2018-01-29 DIAGNOSIS — Z79899 Other long term (current) drug therapy: Secondary | ICD-10-CM | POA: Diagnosis not present

## 2018-01-29 DIAGNOSIS — F17210 Nicotine dependence, cigarettes, uncomplicated: Secondary | ICD-10-CM | POA: Diagnosis not present

## 2018-01-29 NOTE — ED Triage Notes (Signed)
Pt reports intermittent 'pressure' in her head x 1 month. Reports being lightheaded associated with a-fib.  Pt is currently wearing a heart monitor. Denies CP, SOB.

## 2018-01-30 ENCOUNTER — Emergency Department (HOSPITAL_BASED_OUTPATIENT_CLINIC_OR_DEPARTMENT_OTHER): Payer: Managed Care, Other (non HMO)

## 2018-01-30 ENCOUNTER — Emergency Department (HOSPITAL_BASED_OUTPATIENT_CLINIC_OR_DEPARTMENT_OTHER)
Admission: EM | Admit: 2018-01-30 | Discharge: 2018-01-30 | Disposition: A | Discharge status: Home / Self Care | Payer: Managed Care, Other (non HMO) | Attending: Emergency Medicine | Admitting: Emergency Medicine

## 2018-01-30 DIAGNOSIS — R51 Headache: Secondary | ICD-10-CM

## 2018-01-30 DIAGNOSIS — R519 Headache, unspecified: Secondary | ICD-10-CM

## 2018-01-30 LAB — CBC WITH DIFFERENTIAL/PLATELET
Basophils Absolute: 0 10*3/uL (ref 0.0–0.1)
Basophils Relative: 0 %
EOS PCT: 2 %
Eosinophils Absolute: 0.1 10*3/uL (ref 0.0–0.7)
HEMATOCRIT: 35.1 % — AB (ref 36.0–46.0)
Hemoglobin: 11.6 g/dL — ABNORMAL LOW (ref 12.0–15.0)
LYMPHS PCT: 33 %
Lymphs Abs: 2.7 10*3/uL (ref 0.7–4.0)
MCH: 27.9 pg (ref 26.0–34.0)
MCHC: 33 g/dL (ref 30.0–36.0)
MCV: 84.4 fL (ref 78.0–100.0)
MONO ABS: 1 10*3/uL (ref 0.1–1.0)
MONOS PCT: 12 %
NEUTROS ABS: 4.3 10*3/uL (ref 1.7–7.7)
Neutrophils Relative %: 53 %
PLATELETS: 147 10*3/uL — AB (ref 150–400)
RBC: 4.16 MIL/uL (ref 3.87–5.11)
RDW: 14.6 % (ref 11.5–15.5)
WBC: 8.1 10*3/uL (ref 4.0–10.5)

## 2018-01-30 LAB — BASIC METABOLIC PANEL
Anion gap: 9 (ref 5–15)
BUN: 14 mg/dL (ref 6–20)
CALCIUM: 9.1 mg/dL (ref 8.9–10.3)
CO2: 23 mmol/L (ref 22–32)
CREATININE: 0.74 mg/dL (ref 0.44–1.00)
Chloride: 108 mmol/L (ref 101–111)
GFR calc Af Amer: >60 mL/min (ref >60)
GLUCOSE: 100 mg/dL — AB (ref 65–99)
Potassium: 4.4 mmol/L (ref 3.5–5.1)
Sodium: 140 mmol/L (ref 135–145)

## 2018-01-30 LAB — PREGNANCY, URINE: Preg Test, Ur: NEGATIVE

## 2018-01-30 NOTE — ED Notes (Signed)
Pt lying flat for orthostatics 

## 2018-01-30 NOTE — ED Notes (Signed)
ED Provider at bedside. 

## 2018-01-30 NOTE — ED Provider Notes (Signed)
[facility] EMERGENCY DEPARTMENT Provider Note   CSN: [id number] Arrival date & time: 01/29/18  2309     History   Chief Complaint Chief Complaint  Patient presents with  . Headache    HPI Kellie Shaffer is a 27 y.o. female.  Patient with history of hyperthyroidism, ITP, atrial fibrillation wearing heart monitor at this time presenting with a 1 month history of "pressure in my head" with sensation of something moving inside of her head.  She denies pain.  She states this sensation is worse in the mornings and improves throughout the day.  She is taken Tylenol for it but nothing else.  No focal weakness, numbness or tingling.  Light does bother her head slightly.  No fever, chills, nausea or vomiting.  No visual changes.  No weakness in her arms or legs.  History lists diabetes though patient states this was ruled out.  The history is provided by the patient and a relative.  Headache   Associated symptoms include palpitations. Pertinent negatives include no fever, no shortness of breath, no nausea and no vomiting.    Past Medical History:  Diagnosis Date  . Abortion May 2013  . Anemia   . Atrial fibrillation (HCC)   . Diabetes mellitus without complication (HCC)   . History of ITP   . Hypercholesteremia   . Hypertension   . Hyperthyroidism   . Iron deficiency anemia 09/04/2016  . Ovarian cyst     Patient Active Problem List   Diagnosis Date Noted  . Palpitations 01/12/2018  . Elevated troponin 01/12/2018  . DKA (diabetic ketoacidoses) (HCC) 08/27/2017  . Hyperglycemia 08/13/2017  . Oral candidiasis 08/13/2017  . Hypokalemia 07/19/2017  . Acute appendicitis 01/14/2017  . Iron deficiency anemia 09/04/2016  . Hyperthyroidism 07/05/2014  . Thrombocytopenia (HCC) 05/27/2014  . Acute appendicitis, uncomplicated 05/26/2014  . Hypercholesteremia     Past Surgical History:  Procedure Laterality Date  . APPENDECTOMY    . LAPAROSCOPIC APPENDECTOMY N/A  01/14/2017   Procedure: APPENDECTOMY LAPAROSCOPIC;  Surgeon: Claud Kelp, MD;  Location: [facility];  Service: General;  Laterality: N/A;     OB History    Gravida  1   Para      Term      Preterm      AB      Living        SAB      TAB      Ectopic      Multiple      Live Births               Home Medications    Prior to Admission medications   Medication Sig Start Date End Date Taking? Authorizing Provider  atorvastatin (LIPITOR) 40 MG tablet Take 1 tablet (40 mg total) by mouth daily. 09/10/17   Bing Neighbors, FNP  Biotin 40981 MCG TABS Take 1,000 mcg by mouth daily.     [provider]  cetirizine (ZYRTEC) 10 MG tablet Take 1 tablet (10 mg total) by mouth daily as needed for allergies. 09/10/17   Bing Neighbors, FNP  IRON PO Take 1 tablet by mouth daily.     [provider]  lisinopril (PRINIVIL,ZESTRIL) 5 MG tablet Take 1 tablet (5 mg total) by mouth daily. 09/10/17   Bing Neighbors, FNP  methimazole (TAPAZOLE) 10 MG tablet Take 1 tablet (10 mg total) by mouth daily. 10/14/17   Romero Belling, MD  metoprolol tartrate (LOPRESSOR) 25 MG  tablet Take 0.5 tablets (12.5 mg total) by mouth every morning. 09/10/17   Bing NeighborsHarris, Kimberly S, FNP  potassium chloride SA (K-DUR,KLOR-CON) 20 MEQ tablet Take 1 tablet (20 mEq total) by mouth daily. 01/03/18   Kallie LocksStroud, Natalie M, FNP    Family History Family History  Problem Relation Age of Onset  . Diabetes Other        Parent  . Cancer Other        Breast Cancer-Parent  . Hypertension Other        Parent  . Hyperlipidemia Other        Parent  . Arthritis Other        Parent    Social History Social History   Tobacco Use  . Smoking status: Current Every Day Smoker    Packs/day: 0.25    Types: Cigarettes  . Smokeless tobacco: Never Used  Substance Use Topics  . Alcohol use: Yes    Alcohol/week: 0.0 oz    Comment: 2-3 times a month   . Drug use: No     Allergies   Benadryl  [diphenhydramine hcl] and Chloroxine   Review of Systems Review of Systems  Constitutional: Negative for activity change, appetite change and fever.  HENT: Negative for congestion and rhinorrhea.   Eyes: Positive for photophobia. Negative for visual disturbance.  Respiratory: Negative for cough, chest tightness, shortness of breath and wheezing.   Cardiovascular: Positive for palpitations. Negative for chest pain.  Gastrointestinal: Negative for abdominal pain, nausea and vomiting.  Genitourinary: Negative for dysuria, hematuria, vaginal bleeding and vaginal discharge.  Musculoskeletal: Negative for arthralgias and myalgias.  Neurological: Positive for headaches. Negative for dizziness, seizures, syncope, weakness and light-headedness.   all other systems are negative except as noted in the HPI and PMH.     Physical Exam Updated Vital Signs BP 133/83 (BP Location: Right Arm)   Pulse 78   Temp 98.3 F (36.8 C)   Resp 19   LMP 01/25/2018   SpO2 100%   Physical Exam  Constitutional: She is oriented to person, place, and time. She appears well-developed and well-nourished. No distress.  HENT:  Head: Normocephalic and atraumatic.  Mouth/Throat: Oropharynx is clear and moist. No oropharyngeal exudate.  Eyes: Pupils are equal, round, and reactive to light. Conjunctivae and EOM are normal.  Exophthalmos  Neck: Normal range of motion. Neck supple.  No meningismus.  Cardiovascular: Normal rate, regular rhythm, normal heart sounds and intact distal pulses.  No murmur heard. Pulmonary/Chest: Effort normal and breath sounds normal. No respiratory distress.  Abdominal: Soft. There is no tenderness. There is no rebound and no guarding.  Musculoskeletal: Normal range of motion. She exhibits no edema or tenderness.  Neurological: She is alert and oriented to person, place, and time. No cranial nerve deficit. She exhibits normal muscle tone. Coordination normal.  CN 2-12 intact, no ataxia on  finger to nose, no nystagmus, 5/5 strength throughout, no pronator drift, Romberg negative, normal gait.   Skin: Skin is warm.  Psychiatric: She has a normal mood and affect. Her behavior is normal.  Nursing note and vitals reviewed.    ED Treatments / Results  Labs (all labs ordered are listed, but only abnormal results are displayed) Labs Reviewed  CBC WITH DIFFERENTIAL/PLATELET - Abnormal; Notable for the following components:      Result Value   Hemoglobin 11.6 (*)    HCT 35.1 (*)    Platelets 147 (*)    All other components within normal limits  BASIC METABOLIC PANEL - Abnormal; Notable for the following components:   Glucose, Bld 100 (*)    All other components within normal limits  PREGNANCY, URINE    EKG EKG Interpretation  Date/Time:  Saturday January 29 2018 23:21:58 EDT Ventricular Rate:  83 PR Interval:  154 QRS Duration: 92 QT Interval:  394 QTC Calculation: 462 R Axis:   65 Text Interpretation:  Normal sinus rhythm Possible Anterior infarct , age undetermined Abnormal ECG No significant change was found Confirmed by Glynn Octave [phone]) on 01/30/2018 2:56:27 AM   Radiology Ct Head Wo Contrast  Result Date: 01/30/2018 CLINICAL DATA:  Headache, chronic, neuro deficit EXAM: CT HEAD WITHOUT CONTRAST TECHNIQUE: Contiguous axial images were obtained from the base of the skull through the vertex without intravenous contrast. COMPARISON:  None. FINDINGS: Brain: No intracranial hemorrhage, mass effect, or midline shift. The cerebellar tonsils extend approximately 12 mm beyond the foramen magnum with crowding of the basilar cisterns. No hydrocephalus. No evidence of territorial infarct or acute ischemia. No extra-axial or intracranial fluid collection. Vascular: No hyperdense vessel or unexpected calcification. Skull: No fracture or focal lesion. Sinuses/Orbits: Minimal bubbly debris in right frontal sinus. No mucosal thickening or fluid levels. Mastoid air cells are  clear. Visualized orbits are unremarkable. Other: None. IMPRESSION: 1.  No acute intracranial abnormality. 2. 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 Recommend brain MRI for further evaluation. Electronically Signed   By: Rubye Oaks M.D.   On: 01/30/2018 03:50    Procedures Procedures (including critical care time)  Medications Ordered in ED Medications - No data to display   Initial Impression / Assessment and Plan / ED Course  I have reviewed the triage vital signs and the nursing notes.  Pertinent labs & imaging results that were available during my care of the patient were reviewed by me and considered in my medical decision making (see chart for details).    Sensation of fullness in the head worsening over the past 1 month.  No neurological deficits.  No fever.  Orthostatics are negative.  Patient currently seeing cardiology and wearing Holter monitor for sensation of palpitations. Sinus rhythm today. No CP or SOB. Orthostatics negative.  Labs reassuring.   CT head obtained and shows nothing acute.  Does show extension of cerebellar tonsils below the foramen magnum.  Patient declines any medication for headache today.  D/w patient need for MRI given abnormal CT head findings and followup with neurology. Do not suspect CT findings are contributing to the sensation in her head.  Followup with PCP and neurology. Return precautions discussed.   Final Clinical Impressions(s) / ED Diagnoses   Final diagnoses:  Nonintractable episodic headache, unspecified headache type    ED Discharge Orders    None       Rheta Hemmelgarn, Jeannett Senior, MD 01/30/18 [phone]

## 2018-01-30 NOTE — ED Notes (Signed)
Pt given d/c instructions as per chart. Verbalizes understanding. No questions. 

## 2018-01-30 NOTE — ED Notes (Addendum)
Patient transported to CT 

## 2018-01-30 NOTE — ED Notes (Addendum)
Pt states she has had symptoms of head feeling heavy and like something was "moving inside of her head" x 1 month. Light bothers her a little, but does not change the feeling she has. Neuro WNL.

## 2018-01-30 NOTE — Discharge Instructions (Addendum)
Follow-up with the neurologist.  As we discussed your CT scan does show an abnormality suspicious of Chiari malformation which could be normal for you.  You should have an MRI to have this further evaluated.  It is not likely the source of your head discomfort.  Return to the ED if your headache suddenly becomes worse, is associated with fever, weakness, numbness or other concerns.

## 2018-01-31 ENCOUNTER — Ambulatory Visit (INDEPENDENT_AMBULATORY_CARE_PROVIDER_SITE_OTHER): Payer: Self-pay | Admitting: Family Medicine

## 2018-01-31 ENCOUNTER — Encounter: Payer: Self-pay | Admitting: Family Medicine

## 2018-01-31 VITALS — BP 130/82 | HR 64 | Temp 98.4°F | Ht 69.0 in | Wt 230.0 lb

## 2018-01-31 DIAGNOSIS — R42 Dizziness and giddiness: Secondary | ICD-10-CM

## 2018-01-31 DIAGNOSIS — K0889 Other specified disorders of teeth and supporting structures: Secondary | ICD-10-CM

## 2018-01-31 DIAGNOSIS — J302 Other seasonal allergic rhinitis: Secondary | ICD-10-CM

## 2018-01-31 DIAGNOSIS — E119 Type 2 diabetes mellitus without complications: Secondary | ICD-10-CM

## 2018-01-31 DIAGNOSIS — Z09 Encounter for follow-up examination after completed treatment for conditions other than malignant neoplasm: Secondary | ICD-10-CM

## 2018-01-31 LAB — POCT URINALYSIS DIP (MANUAL ENTRY)
Bilirubin, UA: NEGATIVE
Blood, UA: NEGATIVE
Glucose, UA: NEGATIVE mg/dL
Ketones, POC UA: NEGATIVE mg/dL
Leukocytes, UA: NEGATIVE
Nitrite, UA: NEGATIVE
Protein Ur, POC: NEGATIVE mg/dL
Spec Grav, UA: 1.02 (ref 1.010–1.025)
Urobilinogen, UA: 1 E.U./dL
pH, UA: 7.5 (ref 5.0–8.0)

## 2018-01-31 NOTE — Progress Notes (Signed)
Subjective:    Patient ID: SNOW PEOPLES, female    DOB: June 27, 1991, 27 y.o.   MRN: [id number]   PCP: Raliegh Ip, NP  Chief Complaint  Patient presents with  . Hospitalization Follow-up     HPI  Ms. Cappello has a history of Ovarian Cyst, Iron Deficiency Anemia, Hyperthyroidism, Hypertension, Hypercholesteremia, ITP, Diabetes, A-fib, and Anemia.  Current Status: She is doing well with no complaints. She denies fevers, chills, fatigue, recent infections, weight loss, and night sweats. She has occasional dizziness and blurry vision. She has occasional headaches, which the pressure is worst when she is laying down. She denies falls.   No chest pain, heart palpitations, cough and shortness of breath reported. She is currently on a heart monitor placed by her Cardiologist on 01/19/2018 to remain for 2 weeks.   No reports of GI problems.   She has no reports of blood in stools, dysuria and hematuria.  No depression or anxiety. She has no pain today.   She has discontinued Buspar.    Past Medical History:  Diagnosis Date  . Abortion May 2013  . Anemia   . Atrial fibrillation (HCC)   . Diabetes mellitus without complication (HCC)   . History of ITP   . Hypercholesteremia   . Hypertension   . Hyperthyroidism   . Iron deficiency anemia 09/04/2016  . Ovarian cyst     Family History  Problem Relation Age of Onset  . Diabetes Other        Parent  . Cancer Other        Breast Cancer-Parent  . Hypertension Other        Parent  . Hyperlipidemia Other        Parent  . Arthritis Other        Parent    Social History   Socioeconomic History  . Marital status: Single    Spouse name: Not on file  . Number of children: Not on file  . Years of education: 59  . Highest education level: Not on file  Occupational History  . 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 Financial resource strain: Not on file  . Food insecurity:    Worry: Not on file    Inability:  Not on file  . Transportation needs:    Medical: Not on file    Non-medical: Not on file  Tobacco Use  . Smoking status: Current Every Day Smoker    Packs/day: 0.25    Types: Cigarettes  . Smokeless tobacco: Never Used  Substance and Sexual Activity  . Alcohol use: Yes    Alcohol/week: 0.0 oz    Comment: 2-3 times a month   . Drug use: No  . Sexual activity: Yes    Birth control/protection: Condom  Lifestyle  . Physical activity:    Days per week: Not on file    Minutes per session: Not on file  . Stress: Not on file  Relationships  . Social connections:    Talks on phone: Not on file    Gets together: Not on file    Attends religious service: Not on file    Active member of club or organization: Not on file    Attends meetings of clubs or organizations: Not on file    Relationship status: Not on file  . Intimate partner violence:    Fear of current or ex partner: Not on file    Emotionally abused: Not on file  Physically abused: Not on file    Forced sexual activity: Not on file  Other Topics Concern  . Not on file  Social History Narrative   Regular exercise-yes   Caffeine Use-yes          Past Surgical History:  Procedure Laterality Date  . APPENDECTOMY    . LAPAROSCOPIC APPENDECTOMY N/A 01/14/2017   Procedure: APPENDECTOMY LAPAROSCOPIC;  Surgeon: Claud KelpIngram, Haywood, MD;  Location: [facility];  Service: General;  Laterality: N/A;    Immunization History  Administered Date(s) Administered  . Pneumococcal-Unspecified 08/17/2009  . Tdap 08/17/2010    Current Meds  Medication Sig  . atorvastatin (LIPITOR) 40 MG tablet Take 1 tablet (40 mg total) by mouth daily.  . Biotin 4742510000 MCG TABS Take 1,000 mcg by mouth daily.   . cetirizine (ZYRTEC) 10 MG tablet Take 1 tablet (10 mg total) by mouth daily as needed for allergies.  . IRON PO Take 1 tablet by mouth daily.   Marland Kitchen. lisinopril (PRINIVIL,ZESTRIL) 5 MG tablet Take 1 tablet (5 mg total) by mouth daily.  . methimazole  (TAPAZOLE) 10 MG tablet Take 1 tablet (10 mg total) by mouth daily.  . metoprolol tartrate (LOPRESSOR) 25 MG tablet Take 0.5 tablets (12.5 mg total) by mouth every morning.  . potassium chloride SA (K-DUR,KLOR-CON) 20 MEQ tablet Take 1 tablet (20 mEq total) by mouth daily.    Allergies  Allergen Reactions  . Benadryl [Diphenhydramine Hcl] Anaphylaxis and Hives  . Chloroxine Hives    Clorox    BP 130/82 (BP Location: Right Arm, Patient Position: Sitting, Cuff Size: Large)   Pulse 64   Temp 98.4 F (36.9 C) (Oral)   Ht 5\' 9"  (1.753 m)   Wt 230 lb (104.3 kg)   LMP 01/25/2018   SpO2 100%   BMI 33.97 kg/m    Review of Systems  Constitutional: Negative.   HENT: Negative.   Eyes: Negative.   Respiratory: Negative.   Cardiovascular: Negative.   Gastrointestinal: Negative.   Endocrine: Negative.   Genitourinary: Negative.   Musculoskeletal: Negative.   Skin: Negative.   Allergic/Immunologic: Negative.   Neurological: Positive for dizziness (frequently), weakness and headaches.  Hematological: Negative.   Psychiatric/Behavioral: Negative.    Objective:   Physical Exam  Constitutional: She is oriented to person, place, and time. She appears well-developed and well-nourished.  HENT:  Head: Normocephalic and atraumatic.  Right Ear: External ear normal.  Left Ear: External ear normal.  Nose: Nose normal.  Mouth/Throat: Oropharynx is clear and moist.  Eyes: Pupils are equal, round, and reactive to light. Conjunctivae and EOM are normal.  Neck: Normal range of motion. Neck supple.  Cardiovascular: Normal rate, regular rhythm, normal heart sounds and intact distal pulses.  Pulmonary/Chest: Effort normal and breath sounds normal.  Abdominal: Soft. Bowel sounds are normal.  Neurological: She is alert and oriented to person, place, and time.  Skin: Skin is warm and dry. Capillary refill takes less than 2 seconds.  Psychiatric: She has a normal mood and affect. Her behavior is  normal. Judgment and thought content normal.  Nursing note and vitals reviewed.  Assessment & Plan:   1. Type 2 diabetes mellitus without complication, without long-term current use of insulin (HCC) Urinalysis is stable today. Glucose level was stable at 100 on 01/30/2018. - POCT urinalysis dipstick  2. Dizziness We will refer her to Neurology for assessment.  - Ambulatory referral to Neurology  3. Tooth pain Stable. We will refer her to Dentist.  -  Ambulatory referral to Dentistry  4. Seasonal allergies She will take Zyrtec as needed for allergies.  5. Follow up Keep follow up appointment for 03/2018.  No orders of the defined types were placed in this encounter.  Raliegh Ip,  MSN, FNP-BC [facility] [address] [place], [place] [address] [phone]

## 2018-02-01 ENCOUNTER — Ambulatory Visit: Payer: Self-pay | Admitting: Family Medicine

## 2018-02-02 ENCOUNTER — Telehealth: Payer: Self-pay

## 2018-02-02 NOTE — Telephone Encounter (Signed)
-----   Message from Kallie LocksNatalie M Stroud, FNP sent at 02/01/2018 10:47 PM EDT ----- Regarding: "Referrals" Lyla Sonarrie,   I referred patient to:  1) Neurology 2) Dentist  Looks like she is a self-pay. Do we need to tell her that she needs to [facility] and once she qualifies she will be able to go to these referrals??

## 2018-02-02 NOTE — Telephone Encounter (Signed)
Patient advise that she needs to apply for [place] assistance and orange card for the referrals that were placed

## 2018-02-08 MED FILL — METOPROLOL TARTRATE 25 MG T: 25 | 30 days supply | Qty: 15 | Fill #5

## 2018-02-08 MED FILL — ATORVASTATIN CALCIUM 40 MG: 40 | 30 days supply | Qty: 30 | Fill #5

## 2018-02-09 MED FILL — LISINOPRIL 5 MG TAB: 5 | 30 days supply | Qty: 30 | Fill #4

## 2018-02-21 ENCOUNTER — Encounter: Payer: Self-pay | Admitting: Internal Medicine

## 2018-02-21 ENCOUNTER — Telehealth: Payer: Self-pay | Admitting: Internal Medicine

## 2018-02-21 ENCOUNTER — Inpatient Hospital Stay (HOSPITAL_BASED_OUTPATIENT_CLINIC_OR_DEPARTMENT_OTHER): Payer: Managed Care, Other (non HMO) | Admitting: Internal Medicine

## 2018-02-21 ENCOUNTER — Inpatient Hospital Stay: Payer: Managed Care, Other (non HMO) | Attending: Internal Medicine

## 2018-02-21 VITALS — BP 149/79 | HR 69 | Temp 98.7°F | Resp 18 | Ht 70.5 in | Wt 234.3 lb

## 2018-02-21 DIAGNOSIS — E119 Type 2 diabetes mellitus without complications: Secondary | ICD-10-CM | POA: Insufficient documentation

## 2018-02-21 DIAGNOSIS — I10 Essential (primary) hypertension: Secondary | ICD-10-CM

## 2018-02-21 DIAGNOSIS — E0590 Thyrotoxicosis, unspecified without thyrotoxic crisis or storm: Secondary | ICD-10-CM

## 2018-02-21 DIAGNOSIS — D696 Thrombocytopenia, unspecified: Secondary | ICD-10-CM

## 2018-02-21 DIAGNOSIS — R002 Palpitations: Secondary | ICD-10-CM | POA: Diagnosis not present

## 2018-02-21 DIAGNOSIS — D693 Immune thrombocytopenic purpura: Secondary | ICD-10-CM

## 2018-02-21 LAB — CBC WITH DIFFERENTIAL (CANCER CENTER ONLY)
BASOS ABS: 0 10*3/uL (ref 0.0–0.1)
BASOS PCT: 0 %
EOS ABS: 0.1 10*3/uL (ref 0.0–0.5)
Eosinophils Relative: 2 %
HCT: 35.9 % (ref 34.8–46.6)
HEMOGLOBIN: 11.5 g/dL — AB (ref 11.6–15.9)
Lymphocytes Relative: 30 %
Lymphs Abs: 2.1 10*3/uL (ref 0.9–3.3)
MCH: 27.4 pg (ref 25.1–34.0)
MCHC: 32 g/dL (ref 31.5–36.0)
MCV: 85.5 fL (ref 79.5–101.0)
Monocytes Absolute: 0.7 10*3/uL (ref 0.1–0.9)
Monocytes Relative: 10 %
NEUTROS PCT: 58 %
Neutro Abs: 4 10*3/uL (ref 1.5–6.5)
Platelet Count: 183 10*3/uL (ref 145–400)
RBC: 4.2 MIL/uL (ref 3.70–5.45)
RDW: 14.3 % (ref 11.2–14.5)
WBC Count: 7 10*3/uL (ref 3.9–10.3)

## 2018-02-21 NOTE — Progress Notes (Signed)
[facility] [phone]   [phone]  OFFICE PROGRESS NOTE  [name], FNP [address] 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  CURRENT THERAPY: Observation.  INTERVAL HISTORY: Kellie Shaffer 27 y.o. female returns to the clinic today for 57-month follow-up visit.  The patient is feeling fine today with no specific complaints except for intermittent dizzy spells.  She was seen by her primary care provider and referred to neurology for evaluation.  She denied having any current chest pain, shortness of breath, cough or hemoptysis.  She denied having any fever or chills.  She has no nausea, vomiting, diarrhea or constipation.  She has no bleeding issues.  She presented today for evaluation and repeat blood work.  MEDICAL HISTORY: Past Medical History:  Diagnosis Date  . Abortion May 2013  . Anemia   . Atrial fibrillation (HCC)   . Diabetes mellitus without complication (HCC)   . History of ITP   . Hypercholesteremia   . Hypertension   . Hyperthyroidism   . Iron deficiency anemia 09/04/2016  . Ovarian cyst     ALLERGIES:  is allergic to benadryl [diphenhydramine hcl] and chloroxine.  MEDICATIONS:  Current Outpatient Medications  Medication Sig Dispense Refill  . atorvastatin (LIPITOR) 40 MG tablet Take 1 tablet (40 mg total) by mouth daily. 90 tablet 3  . Biotin 78469 MCG TABS Take 1,000 mcg by mouth daily.     . cetirizine (ZYRTEC) 10 MG tablet Take 1 tablet (10 mg total) by mouth daily as needed for allergies. 90 tablet 1  . IRON PO Take 1 tablet by mouth daily.     Marland Kitchen lisinopril (PRINIVIL,ZESTRIL) 5 MG tablet Take 1 tablet (5 mg total) by mouth daily. 90 tablet 1  . methimazole (TAPAZOLE) 10 MG tablet Take 1 tablet (10 mg total) by mouth daily. 90 tablet 1  . metoprolol tartrate  (LOPRESSOR) 25 MG tablet Take 0.5 tablets (12.5 mg total) by mouth every morning. 90 tablet 1  . potassium chloride SA (K-DUR,KLOR-CON) 20 MEQ tablet Take 1 tablet (20 mEq total) by mouth daily. 30 tablet 0   No current facility-administered medications for this visit.     SURGICAL HISTORY:  Past Surgical History:  Procedure Laterality Date  . APPENDECTOMY    . LAPAROSCOPIC APPENDECTOMY N/A 01/14/2017   Procedure: APPENDECTOMY LAPAROSCOPIC;  Surgeon: Claud Kelp, MD;  Location: [facility];  Service: General;  Laterality: N/A;    REVIEW OF SYSTEMS:  A comprehensive review of systems was negative except for: Neurological: positive for dizziness   PHYSICAL EXAMINATION: General appearance: alert, cooperative, appears stated age and no distress Head: Normocephalic, without obvious abnormality, atraumatic Neck: no adenopathy, no JVD, supple, symmetrical, trachea midline and thyroid not enlarged, symmetric, no tenderness/mass/nodules Lymph nodes: Cervical, supraclavicular, and axillary nodes normal. Resp: clear to auscultation bilaterally Back: symmetric, no curvature. ROM normal. No CVA tenderness. Cardio: regular rate and rhythm, S1, S2 normal, no murmur, click, rub or gallop GI: soft, non-tender; bowel sounds normal; no masses,  no organomegaly Extremities: extremities normal, atraumatic, no cyanosis or edema  ECOG PERFORMANCE STATUS: 1 - Symptomatic but completely ambulatory  Blood pressure (!) 149/79, pulse 69, temperature 98.7 F (37.1 C), temperature source Oral, resp. rate 18, height 5' 10.5" (1.791 m), weight 234 lb 4.8 oz (106.3 kg), last menstrual period 01/25/2018, SpO2  100 %.  LABORATORY DATA: Lab Results  Component Value Date   WBC 7.0 02/21/2018   HGB 11.5 (L) 02/21/2018   HCT 35.9 02/21/2018   MCV 85.5 02/21/2018   PLT 183 02/21/2018      Chemistry      Component Value Date/Time   NA 140 01/30/2018 0312   NA 139 01/12/2018 1522   NA 132 (L) 08/13/2017 1044   K  4.4 01/30/2018 0312   K 4.3 08/13/2017 1044   CL 108 01/30/2018 0312   CO2 23 01/30/2018 0312   CO2 25 08/13/2017 1044   BUN 14 01/30/2018 0312   BUN 9 01/12/2018 1522   BUN 18.6 08/13/2017 1044   CREATININE 0.74 01/30/2018 0312   CREATININE 0.86 11/23/2017 1322   CREATININE 1.3 (H) 08/13/2017 1044   GLU 517 (H) 08/13/2017 1257      Component Value Date/Time   CALCIUM 9.1 01/30/2018 0312   CALCIUM 9.9 08/13/2017 1044   ALKPHOS 65 11/23/2017 1322   ALKPHOS 100 08/13/2017 1044   AST 13 11/23/2017 1322   AST 5 08/13/2017 1044   ALT 22 11/23/2017 1322   ALT 15 08/13/2017 1044   BILITOT 0.7 11/23/2017 1322   BILITOT 0.52 08/13/2017 1044       RADIOGRAPHIC STUDIES: Ct Head Wo Contrast  Result Date: 01/30/2018 CLINICAL DATA:  Headache, chronic, neuro deficit EXAM: CT HEAD WITHOUT CONTRAST TECHNIQUE: Contiguous axial images were obtained from the base of the skull through the vertex without intravenous contrast. COMPARISON:  None. FINDINGS: Brain: No intracranial hemorrhage, mass effect, or midline shift. The cerebellar tonsils extend approximately 12 mm beyond the foramen magnum with crowding of the basilar cisterns. No hydrocephalus. No evidence of territorial infarct or acute ischemia. No extra-axial or intracranial fluid collection. Vascular: No hyperdense vessel or unexpected calcification. Skull: No fracture or focal lesion. Sinuses/Orbits: Minimal bubbly debris in right frontal sinus. No mucosal thickening or fluid levels. Mastoid air cells are clear. Visualized orbits are unremarkable. Other: None. IMPRESSION: 1.  No acute intracranial abnormality. 2. 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 Recommend brain MRI for further evaluation. Electronically Signed   By: Rubye OaksMelanie  Ehinger M.D.   On: 01/30/2018 03:50    ASSESSMENT AND PLAN: This is a very pleasant 27 years old African-American  female with: 1) idiopathic thrombocytopenic purpura: She was treated a few months ago with a taper dose of prednisone because of the significant thrombocytopenia.  The patient is feeling fine today.  CBC showed no abnormality in her platelets count. I recommended for the patient to continue in observation with repeat CBC, comprehensive metabolic panel and LDH in 3 months.  2) hyperthyroidism: She will continue her routine follow-up visit by her endocrinologist.  3) cardiac palpitation: She was referred to cardiology by her primary care physician for evaluation.  The patient was advised to call immediately if she has any concerning symptoms in the interval.  The patient voices understanding of current disease status and treatment options and is in agreement with the current care plan.  All questions were answered. The patient knows to call the clinic with any problems, questions or concerns. We can certainly see the patient much sooner if necessary.  Disclaimer: This note was dictated with voice recognition software. Similar sounding words can inadvertently be transcribed and may not be corrected upon review.

## 2018-02-21 NOTE — Addendum Note (Signed)
Addended by: Charma IgoBELL, Telisa Ohlsen H on: 02/21/2018 10:44 AM   Modules accepted: Orders

## 2018-02-21 NOTE — Telephone Encounter (Signed)
Scheduled appt per 7/8 los - pt aware of appts - per patient no print out wanted.

## 2018-02-22 ENCOUNTER — Other Ambulatory Visit: Payer: Self-pay

## 2018-02-22 ENCOUNTER — Ambulatory Visit: Payer: Self-pay | Admitting: Internal Medicine

## 2018-02-23 MED FILL — methIMAzole 10 MG TABS: 10 | 30 days supply | Qty: 60 | Fill #3

## 2018-03-01 ENCOUNTER — Encounter: Payer: Self-pay | Admitting: Family Medicine

## 2018-03-01 ENCOUNTER — Ambulatory Visit (INDEPENDENT_AMBULATORY_CARE_PROVIDER_SITE_OTHER): Payer: Managed Care, Other (non HMO) | Admitting: Cardiology

## 2018-03-01 ENCOUNTER — Ambulatory Visit (INDEPENDENT_AMBULATORY_CARE_PROVIDER_SITE_OTHER): Payer: Managed Care, Other (non HMO) | Admitting: Family Medicine

## 2018-03-01 VITALS — BP 144/90 | HR 64 | Temp 98.4°F | Ht 69.0 in | Wt 235.0 lb

## 2018-03-01 DIAGNOSIS — R002 Palpitations: Secondary | ICD-10-CM | POA: Diagnosis not present

## 2018-03-01 DIAGNOSIS — Z09 Encounter for follow-up examination after completed treatment for conditions other than malignant neoplasm: Secondary | ICD-10-CM

## 2018-03-01 DIAGNOSIS — E876 Hypokalemia: Secondary | ICD-10-CM | POA: Diagnosis not present

## 2018-03-01 DIAGNOSIS — R778 Other specified abnormalities of plasma proteins: Secondary | ICD-10-CM

## 2018-03-01 DIAGNOSIS — R7989 Other specified abnormal findings of blood chemistry: Secondary | ICD-10-CM

## 2018-03-01 DIAGNOSIS — F419 Anxiety disorder, unspecified: Secondary | ICD-10-CM

## 2018-03-01 DIAGNOSIS — I10 Essential (primary) hypertension: Secondary | ICD-10-CM | POA: Diagnosis not present

## 2018-03-01 DIAGNOSIS — R51 Headache: Secondary | ICD-10-CM

## 2018-03-01 DIAGNOSIS — R519 Headache, unspecified: Secondary | ICD-10-CM

## 2018-03-01 DIAGNOSIS — R748 Abnormal levels of other serum enzymes: Secondary | ICD-10-CM

## 2018-03-01 MED ORDER — BUSPIRONE HCL 10 MG PO TABS: 10.0000 mg | ORAL_TABLET | Freq: Every day | ORAL | 2 refills | Status: DC

## 2018-03-01 NOTE — Patient Instructions (Signed)
Buspirone tablets What is this medicine? BUSPIRONE (byoo SPYE rone) is used to treat anxiety disorders. This medicine may be used for other purposes; ask your health care provider or pharmacist if you have questions. COMMON BRAND NAME(S): BuSpar What should I tell my health care provider before I take this medicine? They need to know if you have any of these conditions: -kidney or liver disease -an unusual or allergic reaction to buspirone, other medicines, foods, dyes, or preservatives -pregnant or trying to get pregnant -breast-feeding How should I use this medicine? Take this medicine by mouth with a glass of water. Follow the directions on the prescription label. You may take this medicine with or without food. To ensure that this medicine always works the same way for you, you should take it either always with or always without food. Take your doses at regular intervals. Do not take your medicine more often than directed. Do not stop taking except on the advice of your doctor or health care professional. Talk to your pediatrician regarding the use of this medicine in children. Special care may be needed. Overdosage: If you think you have taken too much of this medicine contact a poison control center or emergency room at once. NOTE: This medicine is only for you. Do not share this medicine with others. What if I miss a dose? If you miss a dose, take it as soon as you can. If it is almost time for your next dose, take only that dose. Do not take double or extra doses. What may interact with this medicine? Do not take this medicine with any of the following medications: -linezolid -MAOIs like Carbex, Eldepryl, Marplan, Nardil, and Parnate -methylene blue -procarbazine This medicine may also interact with the following medications: -diazepam -digoxin -diltiazem -erythromycin -grapefruit juice -haloperidol -medicines for mental depression or mood problems -medicines for seizures like  carbamazepine, phenobarbital and phenytoin -nefazodone -other medications for anxiety -rifampin -ritonavir -some antifungal medicines like itraconazole, ketoconazole, and voriconazole -verapamil -warfarin This list may not describe all possible interactions. Give your health care provider a list of all the medicines, herbs, non-prescription drugs, or dietary supplements you use. Also tell them if you smoke, drink alcohol, or use illegal drugs. Some items may interact with your medicine. What should I watch for while using this medicine? Visit your doctor or health care professional for regular checks on your progress. It may take 1 to 2 weeks before your anxiety gets better. You may get drowsy or dizzy. Do not drive, use machinery, or do anything that needs mental alertness until you know how this drug affects you. Do not stand or sit up quickly, especially if you are an older patient. This reduces the risk of dizzy or fainting spells. Alcohol can make you more drowsy and dizzy. Avoid alcoholic drinks. What side effects may I notice from receiving this medicine? Side effects that you should report to your doctor or health care professional as soon as possible: -blurred vision or other vision changes -chest pain -confusion -difficulty breathing -feelings of hostility or anger -muscle aches and pains -numbness or tingling in hands or feet -ringing in the ears -skin rash and itching -vomiting -weakness Side effects that usually do not require medical attention (report to your doctor or health care professional if they continue or are bothersome): -disturbed dreams, nightmares -headache -nausea -restlessness or nervousness -sore throat and nasal congestion -stomach upset This list may not describe all possible side effects. Call your doctor for medical advice about side   effects. You may report side effects to FDA at 1-800-FDA-1088. Where should I keep my medicine? Keep out of the reach  of children. Store at room temperature below 30 degrees C (86 degrees F). Protect from light. Keep container tightly closed. Throw away any unused medicine after the expiration date. NOTE: This sheet is a summary. It may not cover all possible information. If you have questions about this medicine, talk to your doctor, pharmacist, or health care provider.  2018 Elsevier/Gold Standard (2010-03-13 18:06:11)  

## 2018-03-01 NOTE — Patient Instructions (Addendum)
NO CHANGE WITH MEDICATIONS    KEEP HYDRATED  OKAY TO DRINK COFFEE IF CAUSE PALPATIONS , CUT BACK OF THE AMOUNT    Your physician recommends that you schedule a follow-up appointment ON AN AS NEEDED BASIS.

## 2018-03-01 NOTE — Progress Notes (Signed)
Subjective:    Patient ID: Kellie Shaffer, female    DOB: 1991-03-28, 28 y.o.   MRN: [id number]   PCP: Raliegh IpNatalie Ramy Greth, NP  Chief Complaint  Patient presents with  . Anxiety   HPI: Kellie Shaffer has a history of Ovarian Cyst, Iron Deficiency Anemia, Hyperthyroidism, Hypertension, Hypercholesteremia, ITP, Diabetes, A-fib, and Anemia. He is here for follow up today.   Current Status: She is doing well with no complaints. She denies fevers, chills, fatigue, recent infections, weight loss, and night sweats. She has occasional dizziness and blurry vision. She has occasional headaches, which the pressure is worst when she is laying down. She denies falls.   No chest pain, heart palpitations, cough and shortness of breath reported. She was recently cleared by her after being on a heart monitor placed by her Cardiologist.   No reports of GI problems.   She has no reports of blood in stools, dysuria and hematuria.  No depression or anxiety. She has no pain today.   She has discontinued Buspar.    Past Medical History:  Diagnosis Date  . Abortion May 2013  . Anemia   . Diabetes mellitus without complication (HCC)   . History of ITP   . History of palpitations    Cardiac event monitor June 2019: Normal.  Sinus rhythm no arrhythmias or significant PACs/PVCs.  Marland Kitchen. Hypercholesteremia   . Hypertension   . Hyperthyroidism   . Iron deficiency anemia 09/04/2016  . Ovarian cyst     Family History  Problem Relation Age of Onset  . Diabetes Other        Parent  . Cancer Other        Breast Cancer-Parent  . Hypertension Other        Parent  . Hyperlipidemia Other        Parent  . Arthritis Other        Parent    Social History   Socioeconomic History  . Marital status: Single    Spouse name: Not on file  . Number of children: Not on file  . Years of education: 1814  . Highest education level: Not on file  Occupational History  . 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  Financial resource strain: Not on file  . Food insecurity:    Worry: Not on file    Inability: Not on file  . Transportation needs:    Medical: Not on file    Non-medical: Not on file  Tobacco Use  . Smoking status: Current Every Day Smoker    Packs/day: 0.25    Types: Cigarettes  . Smokeless tobacco: Never Used  Substance and Sexual Activity  . Alcohol use: Yes    Alcohol/week: 0.0 oz    Comment: 2-3 times a month   . Drug use: No  . Sexual activity: Yes    Birth control/protection: Condom  Lifestyle  . Physical activity:    Days per week: Not on file    Minutes per session: Not on file  . Stress: Not on file  Relationships  . Social connections:    Talks on phone: Not on file    Gets together: Not on file    Attends religious service: Not on file    Active member of club or organization: Not on file    Attends meetings of clubs or organizations: Not on file    Relationship status: Not on file  . Intimate partner violence:    Fear of  current or ex partner: Not on file    Emotionally abused: Not on file    Physically abused: Not on file    Forced sexual activity: Not on file  Other Topics Concern  . Not on file  Social History Narrative   Regular exercise-yes   Caffeine Use-yes          Past Surgical History:  Procedure Laterality Date  . APPENDECTOMY    . LAPAROSCOPIC APPENDECTOMY N/A 01/14/2017   Procedure: APPENDECTOMY LAPAROSCOPIC;  Surgeon: Claud Kelp, MD;  Location: [facility];  Service: General;  Laterality: N/A;    Immunization History  Administered Date(s) Administered  . Pneumococcal-Unspecified 08/17/2009  . Tdap 08/17/2010    Current Meds  Medication Sig  . atorvastatin (LIPITOR) 40 MG tablet Take 1 tablet (40 mg total) by mouth daily.  . Biotin 86578 MCG TABS Take 1,000 mcg by mouth daily.   . cetirizine (ZYRTEC) 10 MG tablet Take 1 tablet (10 mg total) by mouth daily as needed for allergies.  . IRON PO Take 1 tablet by mouth daily.   Marland Kitchen  lisinopril (PRINIVIL,ZESTRIL) 5 MG tablet Take 1 tablet (5 mg total) by mouth daily.  . methimazole (TAPAZOLE) 10 MG tablet Take 1 tablet (10 mg total) by mouth daily.  . metoprolol tartrate (LOPRESSOR) 25 MG tablet Take 0.5 tablets (12.5 mg total) by mouth every morning.  . Multiple Vitamins-Minerals (MULTIVITAMIN ADULT PO) Take by mouth daily.    Allergies  Allergen Reactions  . Benadryl [Diphenhydramine Hcl] Anaphylaxis and Hives  . Chloroxine Hives    Clorox    BP (!) 144/90 (BP Location: Left Wrist, Patient Position: Sitting, Cuff Size: Large)   Pulse 64   Temp 98.4 F (36.9 C) (Oral)   Ht 5\' 9"  (1.753 m)   Wt 235 lb (106.6 kg)   LMP 02/22/2018   SpO2 99%   BMI 34.70 kg/m   Review of Systems  Constitutional: Negative.   HENT: Negative.   Eyes: Negative.   Respiratory: Negative.   Cardiovascular: Negative.   Gastrointestinal: Negative.   Endocrine: Negative.   Genitourinary: Negative.   Musculoskeletal: Negative.   Skin: Negative.   Allergic/Immunologic: Negative.   Neurological: Positive for dizziness (frequently), weakness and headaches.  Hematological: Negative.   Psychiatric/Behavioral: Negative.    Objective:   Physical Exam  Constitutional: She is oriented to person, place, and time. She appears well-developed and well-nourished.  HENT:  Head: Normocephalic and atraumatic.  Right Ear: External ear normal.  Left Ear: External ear normal.  Nose: Nose normal.  Mouth/Throat: Oropharynx is clear and moist.  Eyes: Pupils are equal, round, and reactive to light. Conjunctivae and EOM are normal.  Neck: Normal range of motion. Neck supple.  Cardiovascular: Normal rate, regular rhythm, normal heart sounds and intact distal pulses.  Pulmonary/Chest: Effort normal and breath sounds normal.  Abdominal: Soft. Bowel sounds are normal.  Neurological: She is alert and oriented to person, place, and time.  Skin: Skin is warm and dry. Capillary refill takes less than 2  seconds.  Psychiatric: She has a normal mood and affect. Her behavior is normal. Judgment and thought content normal.  Nursing note and vitals reviewed.  Assessment & Plan:   1. Hypertension Blood pressure is 144/90 today. Continue Lisinopril and Metoprolol as prescribed. Monitor.   2. Nonintractable headache, unspecified chronicity pattern, unspecified headache type Stable. Continue to use OTC pain medications as prescribed.   3. Anxiety She will restart Buspar today. Monitor.  - busPIRone (BUSPAR) 10  MG tablet; Take 1 tablet (10 mg total) by mouth daily.  Dispense: 30 tablet; Refill: 2  4. Follow up She will follow up in 2 months.    Meds ordered this encounter  Medications  . busPIRone (BUSPAR) 10 MG tablet    Sig: Take 1 tablet (10 mg total) by mouth daily.    Dispense:  30 tablet    Refill:  2   Raliegh Ip,  MSN, FNP-BC [facility] [address] [place], [place] [address] [phone]

## 2018-03-01 NOTE — Progress Notes (Signed)
PCP: [name], FNP  Clinic Note: Chief Complaint  Patient presents with  . Follow-up    Monitor follow-up  . Dizziness    at randomly.     HPI: Kellie Shaffer is a 27 y.o. female who is being seen today for the evaluation of palpitations (with elevated Troponin) at the request of Kellie Neighbors, FNP.   Kellie Shaffer was seen on April 9 by Ms. Harris following an ER visit on November 17, 2017 - she noted palpitations & dizziness. October 08, 2017: Dizziness and palpitations; November 17, 2017--Noted to have hypokalemia, repleted.  Troponin was 0.05 and stable.. --> ordered Cardiac Event Monitor.  Recent Hospitalizations:   NO new hospitalizations.  Studies Personally Reviewed - (if available, images/films reviewed: From Epic Chart or Care Everywhere)  Cardiac event monitor, June 2019: Normal.  Sinus rhythm no arrhythmias or significant PACs/PVCs.  Interval History: Kellie Shaffer presents here today to discuss results of her event monitor.  She tells me she has not had any further episodes of palpitations and or dizziness.  She is staying hydrated and feeling quite well.  She still feels some flip-flopping but no prolonged spells.  Nothing that makes her concerned.  She has not had any further chest pain episodes.  No chest pain or dyspnea at rest or exertion.  No PND, orthopnea or edema.  No syncope/near syncope or TIA/amaurosis fugax.  A lot of stress that was going on in her life has calmed down and therefore triggers her palpitations have gone away.  She is also cutting back some of her caffeine,  and has increased her hydration.  No claudication.  ROS: A comprehensive was performed. Review of Systems  Constitutional: Negative for malaise/fatigue (More than anything, somewhat deconditioned).  HENT: Negative for nosebleeds.   Genitourinary: Negative for dysuria, flank pain and hematuria.  Musculoskeletal: Negative for joint pain.  Neurological: Negative for dizziness  and focal weakness.  Psychiatric/Behavioral: Negative for memory loss. The patient is nervous/anxious.   All other systems reviewed and are negative.   I have reviewed and (if needed) personally updated the patient's problem list, medications, allergies, past medical and surgical history, social and family history.   Past Medical History:  Diagnosis Date  . Abortion May 2013  . Anemia   . Diabetes mellitus without complication (HCC)   . History of ITP   . History of palpitations    Cardiac event monitor June 2019: Normal.  Sinus rhythm no arrhythmias or significant PACs/PVCs.  Marland Kitchen Hypercholesteremia   . Hypertension   . Hyperthyroidism   . Iron deficiency anemia 09/04/2016  . Ovarian cyst     Past Surgical History:  Procedure Laterality Date  . APPENDECTOMY    . LAPAROSCOPIC APPENDECTOMY N/A 01/14/2017   Procedure: APPENDECTOMY LAPAROSCOPIC;  Surgeon: Kellie Kelp, MD;  Location: [facility];  Service: General;  Laterality: N/A;    Current Meds  Medication Sig  . atorvastatin (LIPITOR) 40 MG tablet Take 1 tablet (40 mg total) by mouth daily.  . Biotin 16109 MCG TABS Take 1,000 mcg by mouth daily.   . cetirizine (ZYRTEC) 10 MG tablet Take 1 tablet (10 mg total) by mouth daily as needed for allergies.  . IRON PO Take 1 tablet by mouth daily.   Marland Kitchen lisinopril (PRINIVIL,ZESTRIL) 5 MG tablet Take 1 tablet (5 mg total) by mouth daily.  . methimazole (TAPAZOLE) 10 MG tablet Take 1 tablet (10 mg total) by mouth daily.  . metoprolol tartrate (LOPRESSOR) 25  MG tablet Take 0.5 tablets (12.5 mg total) by mouth every morning.  . Multiple Vitamins-Minerals (MULTIVITAMIN ADULT PO) Take by mouth daily.    Allergies  Allergen Reactions  . Benadryl [Diphenhydramine Hcl] Anaphylaxis and Hives  . Chloroxine Hives    Clorox    Social History   Tobacco Use  . Smoking status: Current Every Day Smoker    Packs/day: 0.25    Types: Cigarettes  . Smokeless tobacco: Never Used  Substance Use  Topics  . Alcohol use: Yes    Alcohol/week: 0.0 oz    Comment: 2-3 times a month   . Drug use: No   Social History   Social History Narrative   Regular exercise-yes   Caffeine Use-yes          family history includes Arthritis in her other; Cancer in her other; Diabetes in her other; Hyperlipidemia in her other; Hypertension in her other.  Wt Readings from Last 3 Encounters:  03/01/18 235 lb (106.6 kg)  03/01/18 235 lb (106.6 kg)  02/21/18 234 lb 4.8 oz (106.3 kg)    PHYSICAL EXAM BP 139/86   Pulse 68   Ht 5\' 9"  (1.753 m)   Wt 235 lb (106.6 kg)   LMP 02/22/2018   BMI 34.70 kg/m  Physical Exam  Constitutional: She is oriented to person, place, and time. She appears well-developed and well-nourished. No distress.  Mildly obese.  Otherwise healthy-appearing.  Well-groomed  HENT:  Head: Normocephalic and atraumatic.  Neck: No JVD present.  Cardiovascular: Normal rate, regular rhythm, normal heart sounds and intact distal pulses.  No extrasystoles are present. PMI is not displaced. Exam reveals no gallop and no friction rub.  No murmur heard. Pulmonary/Chest: Effort normal and breath sounds normal. No respiratory distress. She has no wheezes.  Musculoskeletal: Normal range of motion. She exhibits no edema.  Neurological: She is alert and oriented to person, place, and time.  Psychiatric: She has a normal mood and affect. Her behavior is normal. Judgment and thought content normal.  Vitals reviewed.    Adult ECG Report n/a   Other studies Reviewed: Additional studies/ records that were reviewed today include:  Recent Labs:     Lab Results  Component Value Date   CREATININE 0.74 01/30/2018   BUN 14 01/30/2018   NA 140 01/30/2018   K 4.4 01/30/2018   CL 108 01/30/2018   CO2 23 01/30/2018   Lab Results  Component Value Date   WBC 7.0 02/21/2018   HGB 11.5 (L) 02/21/2018   HCT 35.9 02/21/2018   MCV 85.5 02/21/2018   PLT 183 02/21/2018   ASSESSMENT /  PLAN: Problem List Items Addressed This Visit    Palpitations    Relatively normal cardiac event monitor with no further symptoms. She is on low-dose beta-blocker.  We stopped her HCTZ to avoid dehydration. Currently doing well.  I do not think any further cardiac evaluation is necessary. Return PRN.      Hypokalemia    It is possible that some of her palpitations are related to hypokalemia.  We stopped her HCTZ to avoid further episodes.      Elevated troponin    Minimal troponin elevation in setting of mild illness.  Within the realm of lab error.  No chest pain associated with it.  States assume that she probably does not have any significant coronary disease.  No further evaluation.         Current medicines are reviewed at length with the patient  today.  (+/- concerns)  asking if she can go back to drinking coffee. The following changes have been made:  DC HCTZ, consider increasing.  Patient Instructions  NO CHANGE WITH MEDICATIONS    KEEP HYDRATED  OKAY TO DRINK COFFEE IF CAUSE PALPATIONS , CUT BACK OF THE AMOUNT    Your physician recommends that you schedule a follow-up appointment ON AN AS NEEDED BASIS.       Studies Ordered:   No orders of the defined types were placed in this encounter.     Bryan Lemmaavid Harding, M.D., M.S. Interventional Cardiologist   Pager # [phone] Phone # [phone] [address] [place], [place] [id number]   Thank you for choosing [facility]!!

## 2018-03-03 ENCOUNTER — Encounter: Payer: Self-pay | Admitting: Cardiology

## 2018-03-03 NOTE — Assessment & Plan Note (Signed)
It is possible that some of her palpitations are related to hypokalemia.  We stopped her HCTZ to avoid further episodes.

## 2018-03-03 NOTE — Assessment & Plan Note (Signed)
Relatively normal cardiac event monitor with no further symptoms. She is on low-dose beta-blocker.  We stopped her HCTZ to avoid dehydration. Currently doing well.  I do not think any further cardiac evaluation is necessary. Return PRN.

## 2018-03-03 NOTE — Assessment & Plan Note (Signed)
Minimal troponin elevation in setting of mild illness.  Within the realm of lab error.  No chest pain associated with it.  States assume that she probably does not have any significant coronary disease.  No further evaluation.

## 2018-03-11 MED FILL — ATORVASTATIN CALCIUM 40 MG: 40 | 30 days supply | Qty: 30 | Fill #6

## 2018-03-11 MED FILL — LISINOPRIL 5 MG TAB: 5 | 30 days supply | Qty: 30 | Fill #5

## 2018-03-11 MED FILL — METOPROLOL TARTRATE 25 MG T: 25 | 30 days supply | Qty: 15 | Fill #6

## 2018-03-16 ENCOUNTER — Ambulatory Visit (INDEPENDENT_AMBULATORY_CARE_PROVIDER_SITE_OTHER): Payer: Managed Care, Other (non HMO) | Admitting: Family Medicine

## 2018-03-16 ENCOUNTER — Encounter: Payer: Self-pay | Admitting: Family Medicine

## 2018-03-16 VITALS — BP 136/80 | HR 85 | Temp 98.1°F | Ht 69.0 in | Wt 235.0 lb

## 2018-03-16 DIAGNOSIS — Z09 Encounter for follow-up examination after completed treatment for conditions other than malignant neoplasm: Secondary | ICD-10-CM | POA: Diagnosis not present

## 2018-03-16 DIAGNOSIS — E860 Dehydration: Secondary | ICD-10-CM

## 2018-03-16 DIAGNOSIS — N898 Other specified noninflammatory disorders of vagina: Secondary | ICD-10-CM | POA: Diagnosis not present

## 2018-03-16 LAB — POCT URINALYSIS DIP (MANUAL ENTRY)
Bilirubin, UA: NEGATIVE
Blood, UA: NEGATIVE
Glucose, UA: NEGATIVE mg/dL
Ketones, POC UA: NEGATIVE mg/dL
Leukocytes, UA: NEGATIVE
Nitrite, UA: NEGATIVE
Protein Ur, POC: NEGATIVE mg/dL
Spec Grav, UA: >=1.03 — AB (ref 1.010–1.025)
Urobilinogen, UA: 0.2 E.U./dL
pH, UA: 5.5 (ref 5.0–8.0)

## 2018-03-16 NOTE — Progress Notes (Signed)
Sick Appointment   Subjective:    Patient ID: Kellie Shaffer, female    DOB: [birth year], 27 y.o.   MRN: [id number]   Chief Complaint  Patient presents with  . Vaginal Itching   HPI Kellie Shaffer has a past medical history of Ovarian Cyst, Iron Deficiency Anemia, Hyperthyroidism, Hypertension, Hypercholesteremia, Diabetes, and Anemia.  Current Status: Since her last office visit, she has been having vaginal itching and discomfort for a week now. She does not have any discharge. Denies pain and hematuria with urination.   Otherwise she is doing well with no complaints. She denies fevers, chills, fatigue, recent infections, weight loss, and night sweats. She has not had any headaches, visual changes, dizziness, and falls. No chest pain, heart palpitations, cough and shortness of breath reported. No reports of GI problems such as nausea, vomiting, diarrhea, and constipation. She has no reports of blood in stools, dysuria and hematuria. No depression or anxiety, and denies suicidal ideations, homicidal ideations, or auditory hallucinations. She denies pain today.   Past Medical History:  Diagnosis Date  . Abortion May 2013  . Anemia   . Diabetes mellitus without complication (HCC)   . History of ITP   . History of palpitations    Cardiac event monitor June 2019: Normal.  Sinus rhythm no arrhythmias or significant PACs/PVCs.  Marland Kitchen. Hypercholesteremia   . Hypertension   . Hyperthyroidism   . Iron deficiency anemia 09/04/2016  . Ovarian cyst     Family History  Problem Relation Age of Onset  . Diabetes Other        Parent  . Cancer Other        Breast Cancer-Parent  . Hypertension Other        Parent  . Hyperlipidemia Other        Parent  . Arthritis Other        Parent    Social History   Socioeconomic History  . Marital status: Single    Spouse name: Not on file  . Number of children: Not on file  . Years of education: 5214  . Highest education level: Not on file   Occupational History  . 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 Financial resource strain: Not on file  . Food insecurity:    Worry: Not on file    Inability: Not on file  . Transportation needs:    Medical: Not on file    Non-medical: Not on file  Tobacco Use  . Smoking status: Current Every Day Smoker    Packs/day: 0.25    Types: Cigarettes  . Smokeless tobacco: Never Used  Substance and Sexual Activity  . Alcohol use: Yes    Alcohol/week: 0.0 oz    Comment: 2-3 times a month   . Drug use: No  . Sexual activity: Yes    Birth control/protection: Condom  Lifestyle  . Physical activity:    Days per week: Not on file    Minutes per session: Not on file  . Stress: Not on file  Relationships  . Social connections:    Talks on phone: Not on file    Gets together: Not on file    Attends religious service: Not on file    Active member of club or organization: Not on file    Attends meetings of clubs or organizations: Not on file    Relationship status: Not on file  . Intimate partner violence:    Fear of current or ex partner: Not  on file    Emotionally abused: Not on file    Physically abused: Not on file    Forced sexual activity: Not on file  Other Topics Concern  . Not on file  Social History Narrative   Regular exercise-yes   Caffeine Use-yes          Past Surgical History:  Procedure Laterality Date  . APPENDECTOMY    . LAPAROSCOPIC APPENDECTOMY N/A 01/14/2017   Procedure: APPENDECTOMY LAPAROSCOPIC;  Surgeon: Claud Kelp, MD;  Location: [facility];  Service: General;  Laterality: N/A;    Immunization History  Administered Date(s) Administered  . Pneumococcal-Unspecified 08/17/2009  . Tdap 08/17/2010    Current Meds  Medication Sig  . atorvastatin (LIPITOR) 40 MG tablet Take 1 tablet (40 mg total) by mouth daily.  . Biotin 16109 MCG TABS Take 1,000 mcg by mouth daily.   . busPIRone (BUSPAR) 10 MG tablet Take 1 tablet (10 mg total) by mouth daily.   . cetirizine (ZYRTEC) 10 MG tablet Take 1 tablet (10 mg total) by mouth daily as needed for allergies.  . IRON PO Take 1 tablet by mouth daily.   Marland Kitchen lisinopril (PRINIVIL,ZESTRIL) 5 MG tablet Take 1 tablet (5 mg total) by mouth daily.  . methimazole (TAPAZOLE) 10 MG tablet Take 1 tablet (10 mg total) by mouth daily.  . metoprolol tartrate (LOPRESSOR) 25 MG tablet Take 0.5 tablets (12.5 mg total) by mouth every morning.  . Multiple Vitamins-Minerals (MULTIVITAMIN ADULT PO) Take by mouth daily.    Allergies  Allergen Reactions  . Benadryl [Diphenhydramine Hcl] Anaphylaxis and Hives  . Chloroxine Hives    Clorox    BP 136/80 (BP Location: Left Arm, Patient Position: Sitting, Cuff Size: Large)   Pulse 85   Temp 98.1 F (36.7 C) (Oral)   Ht 5\' 9"  (1.753 m)   Wt 235 lb (106.6 kg)   LMP 02/22/2018   SpO2 100%   BMI 34.70 kg/m   Review of Systems  Respiratory: Negative.   Gastrointestinal: Negative.   Genitourinary: Negative.   Psychiatric/Behavioral: Negative.    Objective:   Physical Exam  Constitutional: She appears well-developed and well-nourished.  Pulmonary/Chest: Effort normal and breath sounds normal.  Psychiatric: She has a normal mood and affect. Her behavior is normal. Judgment and thought content normal.   Assessment & Plan:   1. Vaginal itching Urinalysis is stable with mild dehydration. We will get vaginal swab today to evaluate further.  - POCT urinalysis dipstick - Vaginitis/Vaginosis, DNA Probe  2. Dehydration Urinalysis reveals mild dehydration. She will drink at least 64 oz of fluids daily, avoid extreme heat, and seek cool environments.   3. Follow up She will keep previously scheduled appointment on 04/29/2018.  No orders of the defined types were placed in this encounter.   Raliegh Ip,  MSN, FNP-C Patient Care [facility] [address] [place], [place] [address] [phone]

## 2018-03-18 LAB — VAGINITIS/VAGINOSIS, DNA PROBE
Candida Species: NEGATIVE
Gardnerella vaginalis: NEGATIVE
Trichomonas vaginosis: NEGATIVE

## 2018-03-21 ENCOUNTER — Telehealth: Payer: Self-pay

## 2018-03-21 NOTE — Telephone Encounter (Signed)
Patient notified

## 2018-03-21 NOTE — Telephone Encounter (Signed)
-----   Message from Kallie LocksNatalie M Stroud, FNP sent at 03/20/2018 12:18 PM EDT ----- Regarding: "Swab Results" Kellie Shaffer,   Please inform patient that swab result were negative. Patient will increase fluid intake. She should call office if she continues to have increased or worsening symptoms.    Thank you.

## 2018-03-28 ENCOUNTER — Ambulatory Visit: Payer: Self-pay | Admitting: Endocrinology

## 2018-03-28 DIAGNOSIS — Z0289 Encounter for other administrative examinations: Secondary | ICD-10-CM

## 2018-04-04 ENCOUNTER — Encounter

## 2018-04-04 ENCOUNTER — Ambulatory Visit: Payer: Self-pay | Admitting: Neurology

## 2018-04-04 ENCOUNTER — Encounter: Payer: Self-pay | Admitting: Neurology

## 2018-04-05 ENCOUNTER — Ambulatory Visit: Payer: Self-pay | Admitting: Family Medicine

## 2018-04-08 MED FILL — METOPROLOL TARTRATE 25 MG T: 25 | 30 days supply | Qty: 15 | Fill #7

## 2018-04-09 ENCOUNTER — Emergency Department (HOSPITAL_BASED_OUTPATIENT_CLINIC_OR_DEPARTMENT_OTHER)
Admission: EM | Admit: 2018-04-09 | Discharge: 2018-04-09 | Disposition: A | Discharge status: Home / Self Care | Payer: Managed Care, Other (non HMO) | Attending: Emergency Medicine | Admitting: Emergency Medicine

## 2018-04-09 ENCOUNTER — Other Ambulatory Visit: Payer: Self-pay

## 2018-04-09 ENCOUNTER — Encounter (HOSPITAL_BASED_OUTPATIENT_CLINIC_OR_DEPARTMENT_OTHER): Payer: Self-pay

## 2018-04-09 DIAGNOSIS — K047 Periapical abscess without sinus: Secondary | ICD-10-CM | POA: Insufficient documentation

## 2018-04-09 DIAGNOSIS — N898 Other specified noninflammatory disorders of vagina: Secondary | ICD-10-CM | POA: Insufficient documentation

## 2018-04-09 DIAGNOSIS — K0889 Other specified disorders of teeth and supporting structures: Secondary | ICD-10-CM | POA: Diagnosis present

## 2018-04-09 LAB — WET PREP, GENITAL
Clue Cells Wet Prep HPF POC: NONE SEEN
Sperm: NONE SEEN
Trich, Wet Prep: NONE SEEN
Yeast Wet Prep HPF POC: NONE SEEN

## 2018-04-09 LAB — URINALYSIS, ROUTINE W REFLEX MICROSCOPIC
BILIRUBIN URINE: NEGATIVE
Glucose, UA: NEGATIVE mg/dL
HGB URINE DIPSTICK: NEGATIVE
KETONES UR: NEGATIVE mg/dL
Leukocytes, UA: NEGATIVE
NITRITE: NEGATIVE
Protein, ur: NEGATIVE mg/dL
Specific Gravity, Urine: 1.015 (ref 1.005–1.030)
pH: 6.5 (ref 5.0–8.0)

## 2018-04-09 LAB — CBG MONITORING, ED: Glucose-Capillary: 106 mg/dL — ABNORMAL HIGH (ref 70–99)

## 2018-04-09 LAB — PREGNANCY, URINE: Preg Test, Ur: NEGATIVE

## 2018-04-09 MED ORDER — CLINDAMYCIN HCL 300 MG PO CAPS: 300.0000 mg | ORAL_CAPSULE | Freq: Three times a day (TID) | ORAL | 0 refills | Status: AC

## 2018-04-09 MED ORDER — ONDANSETRON 4 MG PO TBDP: 4.0000 mg | ORAL_TABLET | Freq: Three times a day (TID) | ORAL | 0 refills | Status: DC | PRN

## 2018-04-09 MED ORDER — CHLORHEXIDINE GLUCONATE 0.12 % MT SOLN: 15.0000 mL | Freq: Two times a day (BID) | OROMUCOSAL | 0 refills | Status: DC

## 2018-04-09 MED ORDER — CLINDAMYCIN HCL 300 MG PO CAPS: 300.0000 mg | ORAL_CAPSULE | Freq: Three times a day (TID) | ORAL | 0 refills | Status: DC

## 2018-04-09 MED ORDER — IBUPROFEN 600 MG PO TABS: 600.0000 mg | ORAL_TABLET | Freq: Three times a day (TID) | ORAL | 0 refills | Status: DC | PRN

## 2018-04-09 MED ORDER — CLINDAMYCIN HCL 150 MG PO CAPS
300.0000 mg | ORAL_CAPSULE | Freq: Once | ORAL | Status: AC
  Administered 2018-04-09: 300 mg via ORAL
  Filled 2018-04-09: qty 2

## 2018-04-09 MED ORDER — ONDANSETRON 4 MG PO TBDP
4.0000 mg | ORAL_TABLET | Freq: Once | ORAL | Status: AC
  Administered 2018-04-09: 4 mg via ORAL
  Filled 2018-04-09: qty 1

## 2018-04-09 NOTE — ED Provider Notes (Signed)
[facility] EMERGENCY DEPARTMENT Provider Note   CSN: [id number] Arrival date & time: 04/09/18  0030     History   Chief Complaint Chief Complaint  Patient presents with  . Oral Swelling  . Abdominal Pain    HPI Kellie Shaffer is a 27 y.o. female.  HPI   27 yo F with h/o DM, recent dental extraction here with dental pain, abd cramping, nausea. Pt states she had been recovering well from her lower right tooth extraction until last 24 hours. Over the past 24 hours, she's developed worsening aching, throbbing pain of her extraction site. 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 She began to notice drainage and foul odor/taste in her mouth. She then began having intermittent mild abd cramping. She felt like she had a different vaginal odor as well, though no vaginal discharge, bleeding, or other complaints and she states that everything "smells off" from the drainage. Denies any persistent abd pain. No nausea, vomiting, diarrhea. No SOB. Her blood sugars have been acceptable. No other complaints. No fevers. Denies any tongue swelling or difficulty swallowing.  Past Medical History:  Diagnosis Date  . Abortion May 2013  . Anemia   . Diabetes mellitus without complication (HCC)   . History of ITP   . History of palpitations    Cardiac event monitor June 2019: Normal.  Sinus rhythm no arrhythmias or significant PACs/PVCs.  Marland Kitchen Hypercholesteremia   . Hypertension   . Hyperthyroidism   . Iron deficiency anemia 09/04/2016  . Ovarian cyst     Patient Active Problem List   Diagnosis Date Noted  . Palpitations 01/12/2018  . Elevated troponin 01/12/2018  . DKA (diabetic ketoacidoses) (HCC) 08/27/2017  . Hyperglycemia 08/13/2017  . Oral candidiasis 08/13/2017  . Hypokalemia 07/19/2017  . Acute appendicitis 01/14/2017  . Iron deficiency anemia 09/04/2016  . Hyperthyroidism 07/05/2014  . Thrombocytopenia (HCC) 05/27/2014  . Acute appendicitis, uncomplicated  05/26/2014  . Hypercholesteremia     Past Surgical History:  Procedure Laterality Date  . APPENDECTOMY    . LAPAROSCOPIC APPENDECTOMY N/A 01/14/2017   Procedure: APPENDECTOMY LAPAROSCOPIC;  Surgeon: Claud Kelp, MD;  Location: [facility] ORS;  Service: General;  Laterality: N/A;     OB History    Gravida  1   Para      Term      Preterm      AB      Living        SAB      TAB      Ectopic      Multiple      Live Births               Home Medications    Prior to Admission medications   Medication Sig Start Date End Date Taking? Authorizing Provider  Biotin 84696 MCG TABS Take 1,000 mcg by mouth daily.    Yes [provider]  busPIRone (BUSPAR) 10 MG tablet Take 1 tablet (10 mg total) by mouth daily. 03/01/18  Yes [name], FNP  cetirizine (ZYRTEC) 10 MG tablet Take 1 tablet (10 mg total) by mouth daily as needed for allergies. 09/10/17  Yes Bing Neighbors, FNP  IRON PO Take 1 tablet by mouth daily.    Yes [provider]  lisinopril (PRINIVIL,ZESTRIL) 5 MG tablet Take 1 tablet (5 mg total) by mouth daily. 09/10/17  Yes Bing Neighbors, FNP  methimazole (TAPAZOLE) 10 MG tablet Take 1 tablet (  10 mg total) by mouth daily. 10/14/17  Yes Romero Belling, MD  metoprolol tartrate (LOPRESSOR) 25 MG tablet Take 0.5 tablets (12.5 mg total) by mouth every morning. 09/10/17  Yes Bing Neighbors, FNP  Multiple Vitamins-Minerals (MULTIVITAMIN ADULT PO) Take by mouth daily.   Yes [provider]  atorvastatin (LIPITOR) 40 MG tablet Take 1 tablet (40 mg total) by mouth daily. 09/10/17   Bing Neighbors, FNP  chlorhexidine (PERIDEX) 0.12 % solution Use as directed 15 mLs in the mouth or throat 2 (two) times daily. 04/09/18   Shaune Pollack, MD  clindamycin (CLEOCIN) 300 MG capsule Take 1 capsule (300 mg total) by mouth 3 (three) times daily for 10 days. 04/09/18 04/19/18  Shaune Pollack, MD  ibuprofen (ADVIL,MOTRIN) 600 MG tablet Take 1 tablet  (600 mg total) by mouth every 8 (eight) hours as needed for moderate pain. 04/09/18   Shaune Pollack, MD  ondansetron (ZOFRAN ODT) 4 MG disintegrating tablet Take 1 tablet (4 mg total) by mouth every 8 (eight) hours as needed for nausea or vomiting. 04/09/18   Shaune Pollack, MD    Family History Family History  Problem Relation Age of Onset  . Diabetes Other        Parent  . Cancer Other        Breast Cancer-Parent  . Hypertension Other        Parent  . Hyperlipidemia Other        Parent  . Arthritis Other        Parent    Social History Social History   Tobacco Use  . Smoking status: Current Every Day Smoker    Packs/day: 0.25    Types: Cigarettes  . Smokeless tobacco: Never Used  Substance Use Topics  . Alcohol use: Yes    Alcohol/week: 0.0 standard drinks    Comment: 2-3 times a month   . Drug use: No     Allergies   Benadryl [diphenhydramine hcl] and Chloroxine   Review of Systems Review of Systems  Constitutional: Positive for fatigue. Negative for chills and fever.  HENT: Positive for dental problem and facial swelling. Negative for congestion and rhinorrhea.   Eyes: Negative for visual disturbance.  Respiratory: Negative for cough, shortness of breath and wheezing.   Cardiovascular: Negative for chest pain and leg swelling.  Gastrointestinal: Positive for abdominal pain and nausea. Negative for diarrhea and vomiting.  Genitourinary: Negative for dysuria and flank pain.  Musculoskeletal: Negative for neck pain and neck stiffness.  Skin: Negative for rash and wound.  Allergic/Immunologic: Negative for immunocompromised state.  Neurological: Negative for syncope, weakness and headaches.  All other systems reviewed and are negative.    Physical Exam Updated Vital Signs BP 130/80 (BP Location: Right Arm)   Pulse (!) 52   Temp 98.4 F (36.9 C) (Oral)   Resp 16   LMP 03/23/2018   SpO2 100%   Physical Exam  Constitutional: She is oriented to person,  place, and time. She appears well-developed and well-nourished. No distress.  HENT:  Head: Normocephalic and atraumatic.  Markedly poor dentition diffusely. S/p right mandibular premolar extraction. There is moderate surrounding gingival edema and erythema, with expressed yellow discharge from the area. No appreciable abscess or focal fluctuance. No facial asymmetry or swelling. No sublingual swelling or edema. Tolerating secretions without difficulty.  Eyes: Conjunctivae are normal.  Neck: Neck supple.  Cardiovascular: Normal rate, regular rhythm and normal heart sounds. Exam reveals no friction rub.  No murmur heard. Pulmonary/Chest:  Effort normal and breath sounds normal. No respiratory distress. She has no wheezes. She has no rales.  Abdominal: She exhibits no distension. There is no tenderness. There is no rigidity, no rebound, no guarding, no CVA tenderness, no tenderness at McBurney's point and negative Murphy's sign.  On exam, no TTP. No rebound or guarding.  Genitourinary:  Genitourinary Comments: Scant white vaginal discharge. No CMT. No adnexal pain or fullness.  Musculoskeletal: She exhibits no edema.  Neurological: She is alert and oriented to person, place, and time. She exhibits normal muscle tone.  Skin: Skin is warm. Capillary refill takes less than 2 seconds.  Psychiatric: She has a normal mood and affect.  Nursing note and vitals reviewed.    ED Treatments / Results  Labs (all labs ordered are listed, but only abnormal results are displayed) Labs Reviewed  WET PREP, GENITAL - Abnormal; Notable for the following components:      Result Value   WBC, Wet Prep HPF POC MODERATE (*)    All other components within normal limits  CBG MONITORING, ED - Abnormal; Notable for the following components:   Glucose-Capillary 106 (*)    All other components within normal limits  URINALYSIS, ROUTINE W REFLEX MICROSCOPIC  PREGNANCY, URINE  GC/CHLAMYDIA PROBE AMP ([place]) NOT AT  [facility]    EKG None  Radiology No results found.  Procedures Procedures (including critical care time)  Medications Ordered in ED Medications  clindamycin (CLEOCIN) capsule 300 mg (300 mg Oral Given 04/09/18 0213)  ondansetron (ZOFRAN-ODT) disintegrating tablet 4 mg (4 mg Oral Given 04/09/18 0213)     Initial Impression / Assessment and Plan / ED Course  I have reviewed the triage vital signs and the nursing notes.  Pertinent labs & imaging results that were available during my care of the patient were reviewed by me and considered in my medical decision making (see chart for details).     27 yo very well appearing female here with dental pain, mild abd pain. Suspect this is 2/2 active dental infection of recent extraction site, now with likely GI upset 2/2 swallowing drainage from wound. On exam, there is no evidence of large abscess amenable to drainage. No facial abscess, cellulitis, or swelling. No evidence of Ludwig's or deep neck infection. Regarding her abd pain, her abdomen is completley soft, NT, ND, and it is more so mild abd upset 2/2 swallowing secretions. Her reported vaginal malodor is likely 2/2 her nasal smell from dental infection, as pelvic exam is unremarkable. Wet prep shows + WBCs but this is chronic and she has no CMT, no clue cells. UA negative without signs of UTI. Glucose normal, no ketones. Will tx with Clinda, d/c with close dentistry follow-up. Given that she is af, well appearing with no swelling to suggest deep abscess or osteo, do not feel CT imaging needed at this time.  Final Clinical Impressions(s) / ED Diagnoses   Final diagnoses:  Dental infection  Vaginal discharge    ED Discharge Orders         Ordered    clindamycin (CLEOCIN) 300 MG capsule  3 times daily,   Status:  Discontinued     04/09/18 0155    ondansetron (ZOFRAN ODT) 4 MG disintegrating tablet  Every 8 hours PRN,   Status:  Discontinued     04/09/18 0155    ibuprofen (ADVIL,MOTRIN)  600 MG tablet  Every 8 hours PRN,   Status:  Discontinued     04/09/18 0155  chlorhexidine (PERIDEX) 0.12 % solution  2 times daily,   Status:  Discontinued     04/09/18 0211    ondansetron (ZOFRAN ODT) 4 MG disintegrating tablet  Every 8 hours PRN     04/09/18 0248    chlorhexidine (PERIDEX) 0.12 % solution  2 times daily     04/09/18 0248    clindamycin (CLEOCIN) 300 MG capsule  3 times daily     04/09/18 0248    ibuprofen (ADVIL,MOTRIN) 600 MG tablet  Every 8 hours PRN     04/09/18 0248           Shaune PollackIsaacs, Breya Cass, MD 04/09/18 [phone]

## 2018-04-09 NOTE — ED Triage Notes (Signed)
Pt reports tooth extraction this past Monday and now c/o swelling and pain 8/10. Pt also reports 7/10 abd pain and vaginal odor. Pt denies vag discharge. Pt denies dysuria. Pt A+OX4, NAD.

## 2018-04-09 NOTE — Discharge Instructions (Addendum)
As we discussed, your symptoms are probably due to a mild infection at your dental extraction site.  We've started you on antibiotics.  Take as prescribed.  Follow-up with your dentist in 2 days.

## 2018-04-11 ENCOUNTER — Other Ambulatory Visit: Payer: Self-pay | Admitting: Family Medicine

## 2018-04-11 LAB — GC/CHLAMYDIA PROBE AMP (~~LOC~~) NOT AT ARMC
Chlamydia: NEGATIVE
Neisseria Gonorrhea: NEGATIVE

## 2018-04-11 MED FILL — ATORVASTATIN CALCIUM 40 MG: 40 | 30 days supply | Qty: 30 | Fill #7

## 2018-04-11 MED FILL — LISINOPRIL 5 MG TAB: 5 | 30 days supply | Qty: 30 | Fill #0

## 2018-04-28 MED FILL — methIMAzole 10 MG TABS: 10 | 30 days supply | Qty: 60 | Fill #4

## 2018-04-29 ENCOUNTER — Ambulatory Visit: Payer: Self-pay | Admitting: Family Medicine

## 2018-04-29 MED FILL — busPIRone HCL 10 MG TABS: 10 | 30 days supply | Qty: 30 | Fill #0

## 2018-05-06 ENCOUNTER — Ambulatory Visit (INDEPENDENT_AMBULATORY_CARE_PROVIDER_SITE_OTHER): Payer: Managed Care, Other (non HMO) | Admitting: Family Medicine

## 2018-05-06 ENCOUNTER — Encounter: Payer: Self-pay | Admitting: Family Medicine

## 2018-05-06 VITALS — BP 132/70 | HR 66 | Temp 98.1°F | Ht 69.0 in | Wt 238.0 lb

## 2018-05-06 DIAGNOSIS — E119 Type 2 diabetes mellitus without complications: Secondary | ICD-10-CM

## 2018-05-06 DIAGNOSIS — F419 Anxiety disorder, unspecified: Secondary | ICD-10-CM | POA: Diagnosis not present

## 2018-05-06 DIAGNOSIS — Z09 Encounter for follow-up examination after completed treatment for conditions other than malignant neoplasm: Secondary | ICD-10-CM

## 2018-05-06 LAB — POCT URINALYSIS DIP (MANUAL ENTRY)
Bilirubin, UA: NEGATIVE
Blood, UA: NEGATIVE
Glucose, UA: NEGATIVE mg/dL
Ketones, POC UA: NEGATIVE mg/dL
Leukocytes, UA: NEGATIVE
Nitrite, UA: NEGATIVE
Spec Grav, UA: 1.02 (ref 1.010–1.025)
Urobilinogen, UA: 0.2 E.U./dL
pH, UA: 7 (ref 5.0–8.0)

## 2018-05-06 NOTE — Progress Notes (Signed)
Follow Up  Subjective:    Patient ID: Kellie Shaffer, female    DOB: [birth year], 27 y.o.   MRN: [id number]   Chief Complaint  Patient presents with  . Vaginal Bleeding    after intercourse    HPI  Kellie Shaffer is a 27 year old female with a past medical history of Ovarian Cyst, Iron Deficiency Anemia, Hyperthyroidism, Hypertension, Hypercholesteremia, Hx of Heart Palpitations, and Diabetes. She is here today for follow up.   Current Status: Since her last office visit, she is overall doing well. She has been to the ED for a dental infection, post dental appointment the day before. She was given antibiotics and released. She reports mild fatigue lately.  She denies increased thirst, frequent urination, hunger, fatigue, blurred vision, excessive hunger, excessive thirst, weight gain, weight loss, and poor wound healing.   She denies fevers, chills, recent infections, weight loss, and night sweats. She has not had any headaches, visual changes, dizziness, and falls. No chest pain, heart palpitations, cough and shortness of breath reported. No reports of GI problems such as nausea, vomiting, diarrhea, and constipation. She has no reports of blood in stools, dysuria and hematuria. She denies pain today.   Past Medical History:  Diagnosis Date  . Abortion May 2013  . Anemia   . Diabetes mellitus without complication (HCC)   . History of ITP   . History of palpitations    Cardiac event monitor June 2019: Normal.  Sinus rhythm no arrhythmias or significant PACs/PVCs.  Marland Kitchen. Hypercholesteremia   . Hypertension   . Hyperthyroidism   . Iron deficiency anemia 09/04/2016  . Ovarian cyst     Family History  Problem Relation Age of Onset  . Diabetes Other        Parent  . Cancer Other        Breast Cancer-Parent  . Hypertension Other        Parent  . Hyperlipidemia Other        Parent  . Arthritis Other        Parent    Social History   Socioeconomic History  . Marital status:  Single    Spouse name: Not on file  . Number of children: Not on file  . Years of education: 5714  . Highest education level: Not on file  Occupational History  . 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 Financial resource strain: Not on file  . Food insecurity:    Worry: Not on file    Inability: Not on file  . Transportation needs:    Medical: Not on file    Non-medical: Not on file  Tobacco Use  . Smoking status: Current Every Day Smoker    Packs/day: 0.25    Types: Cigarettes  . Smokeless tobacco: Never Used  Substance and Sexual Activity  . Alcohol use: Yes    Alcohol/week: 0.0 standard drinks    Comment: 2-3 times a month   . Drug use: No  . Sexual activity: Yes    Birth control/protection: Condom  Lifestyle  . Physical activity:    Days per week: Not on file    Minutes per session: Not on file  . Stress: Not on file  Relationships  . Social connections:    Talks on phone: Not on file    Gets together: Not on file    Attends religious service: Not on file    Active member of club or organization: Not on file  Attends meetings of clubs or organizations: Not on file    Relationship status: Not on file  . Intimate partner violence:    Fear of current or ex partner: Not on file    Emotionally abused: Not on file    Physically abused: Not on file    Forced sexual activity: Not on file  Other Topics Concern  . Not on file  Social History Narrative   Regular exercise-yes   Caffeine Use-yes          Past Surgical History:  Procedure Laterality Date  . APPENDECTOMY    . LAPAROSCOPIC APPENDECTOMY N/A 01/14/2017   Procedure: APPENDECTOMY LAPAROSCOPIC;  Surgeon: Claud Kelp, MD;  Location: [facility] OR[facility];  Service: General;  Laterality: N/A;    Immunization History  Administered Date(s) Administered  . Pneumococcal-Unspecified 08/17/2009  . Tdap 08/17/2010    Current Meds  Medication Sig  . atorvastatin (LIPITOR) 40 MG tablet Take 1 tablet (40 mg total) by  mouth daily.  . Biotin [address] MCG TABS Take 1,000 mcg by mouth daily.   . busPIRone (BUSPAR) 10 MG tablet Take 1 tablet (10 mg total) by mouth daily.  . cetirizine (ZYRTEC) 10 MG tablet Take 1 tablet (10 mg total) by mouth daily as needed for allergies.  . IRON PO Take 1 tablet by mouth daily.   Marland Kitchen lisinopril (PRINIVIL,ZESTRIL) 5 MG tablet TAKE 1 TABLET (5 MG TOTAL) BY MOUTH DAILY.  . methimazole (TAPAZOLE) 10 MG tablet Take 1 tablet (10 mg total) by mouth daily.  . metoprolol tartrate (LOPRESSOR) 25 MG tablet Take 0.5 tablets (12.5 mg total) by mouth every morning.  . Multiple Vitamins-Minerals (MULTIVITAMIN ADULT PO) Take by mouth daily.    Allergies  Allergen Reactions  . Benadryl [Diphenhydramine Hcl] Anaphylaxis and Hives  . Chloroxine Hives    Clorox    BP 132/70 (BP Location: Left Arm, Patient Position: Sitting, Cuff Size: Large)   Pulse 66   Temp 98.1 F (36.7 C) (Oral)   Ht 5\' 9"  (1.753 m)   Wt 238 lb (108 kg)   SpO2 99%   BMI 35.15 kg/m   Review of Systems  HENT: Negative.   Respiratory: Negative.   Gastrointestinal: Positive for abdominal distention (Obese).  Musculoskeletal: Positive for back pain.  Neurological: Positive for dizziness (occasional) and headaches (occasional).  Psychiatric/Behavioral: Negative.     Objective:   Physical Exam  Constitutional: She is oriented to person, place, and time.  HENT:  Head: Normocephalic and atraumatic.  Right Ear: External ear normal.  Left Ear: External ear normal.  Nose: Nose normal.  Mouth/Throat: Oropharynx is clear and moist.  Cardiovascular: Normal rate, regular rhythm, normal heart sounds and intact distal pulses.  Pulmonary/Chest: Effort normal and breath sounds normal.  Abdominal: Soft. Bowel sounds are normal.  Musculoskeletal: Normal range of motion.  Neurological: She is alert and oriented to person, place, and time.  Psychiatric: She has a normal mood and affect. Her behavior is normal. Judgment and  thought content normal.   Assessment & Plan:   1. Type 2 diabetes mellitus without complication, without long-term current use of insulin (HCC) Her most recent blood glucose level was stable at 106 on 04/09/2018. She will continue to decrease foods/beverages high in sugars and carbs and follow Heart Healthy or DASH diet. Increase physical activity to at least 30 minutes cardio exercise daily.   2. Anxiety Moderate situational anxiety. She will continue Buspar as prescribed.   3. Follow up She will follow up in  1 week for Gynecological Exam.  - POCT urinalysis dipstick  No orders of the defined types were placed in this encounter.   Raliegh Ip,  MSN, FNP-C Patient Care [facility] [address] [place], [place] [address] [phone]

## 2018-05-11 ENCOUNTER — Ambulatory Visit (INDEPENDENT_AMBULATORY_CARE_PROVIDER_SITE_OTHER): Payer: Managed Care, Other (non HMO) | Admitting: Family Medicine

## 2018-05-11 ENCOUNTER — Encounter: Payer: Self-pay | Admitting: Family Medicine

## 2018-05-11 VITALS — BP 126/74 | HR 66 | Temp 97.6°F | Ht 69.0 in | Wt 238.0 lb

## 2018-05-11 DIAGNOSIS — Z202 Contact with and (suspected) exposure to infections with a predominantly sexual mode of transmission: Secondary | ICD-10-CM

## 2018-05-11 DIAGNOSIS — Z09 Encounter for follow-up examination after completed treatment for conditions other than malignant neoplasm: Secondary | ICD-10-CM | POA: Diagnosis not present

## 2018-05-11 DIAGNOSIS — N898 Other specified noninflammatory disorders of vagina: Secondary | ICD-10-CM | POA: Diagnosis not present

## 2018-05-11 NOTE — Patient Instructions (Signed)
Sexually Transmitted Disease  A sexually transmitted disease (STD) is a disease or infection that may be passed (transmitted) from person to person, usually during sexual activity. This may happen by way of saliva, semen, blood, vaginal mucus, or urine. Common STDs include:   Gonorrhea.   Chlamydia.   Syphilis.   HIV and AIDS.   Genital herpes.   Hepatitis B and C.   Trichomonas.   Human papillomavirus (HPV).   Pubic lice.   Scabies.   Mites.   Bacterial vaginosis.    What are the causes?  An STD may be caused by bacteria, a virus, or parasites. STDs are often transmitted during sexual activity if one person is infected. However, they may also be transmitted through nonsexual means. STDs may be transmitted after:   Sexual intercourse with an infected person.   Sharing sex toys with an infected person.   Sharing needles with an infected person or using unclean piercing or tattoo needles.   Having intimate contact with the genitals, mouth, or rectal areas of an infected person.   Exposure to infected fluids during birth.    What are the signs or symptoms?  Different STDs have different symptoms. Some people may not have any symptoms. If symptoms are present, they may include:   Painful or bloody urination.   Pain in the pelvis, abdomen, vagina, anus, throat, or eyes.   A skin rash, itching, or irritation.   Growths, ulcerations, blisters, or sores in the genital and anal areas.   Abnormal vaginal discharge with or without bad odor.   Penile discharge in men.   Fever.   Pain or bleeding during sexual intercourse.   Swollen glands in the groin area.   Yellow skin and eyes (jaundice). This is seen with hepatitis.   Swollen testicles.   Infertility.   Sores and blisters in the mouth.    How is this diagnosed?  To make a diagnosis, your health care provider may:   Take a medical history.   Perform a physical exam.   Take a sample of any discharge to examine.   Swab the throat, cervix,  opening to the penis, rectum, or vagina for testing.   Test a sample of your first morning urine.   Perform blood tests.   Perform a Pap test, if this applies.   Perform a colposcopy.   Perform a laparoscopy.    How is this treated?  Treatment depends on the STD. Some STDs may be treated but not cured.   Chlamydia, gonorrhea, trichomonas, and syphilis can be cured with antibiotic medicine.   Genital herpes, hepatitis, and HIV can be treated, but not cured, with prescribed medicines. The medicines lessen symptoms.   Genital warts from HPV can be treated with medicine or by freezing, burning (electrocautery), or surgery. Warts may come back.   HPV cannot be cured with medicine or surgery. However, abnormal areas may be removed from the cervix, vagina, or vulva.   If your diagnosis is confirmed, your recent sexual partners need treatment. This is true even if they are symptom-free or have a negative culture or evaluation. They should not have sex until their health care providers say it is okay.   Your health care provider may test you for infection again 3 months after treatment.    How is this prevented?  Take these steps to reduce your risk of getting an STD:   Use latex condoms, dental dams, and water-soluble lubricants during sexual activity. Do not use   petroleum jelly or oils.   Avoid having multiple sex partners.   Do not have sex with someone who has other sex partners.   Do not have sex with anyone you do not know or who is at high risk for an STD.   Avoid risky sex practices that can break your skin.   Do not have sex if you have open sores on your mouth or skin.   Avoid drinking too much alcohol or taking illegal drugs. Alcohol and drugs can affect your judgment and put you in a vulnerable position.   Avoid engaging in oral and anal sex acts.   Get vaccinated for HPV and hepatitis. If you have not received these vaccines in the past, talk to your health care provider about whether one or  both might be right for you.   If you are at risk of being infected with HIV, it is recommended that you take a prescription medicine daily to prevent HIV infection. This is called pre-exposure prophylaxis (PrEP). You are considered at risk if:  ? You are a man who has sex with other men (MSM).  ? You are a heterosexual man or woman and are sexually active with more than one partner.  ? You take drugs by injection.  ? You are sexually active with a partner who has HIV.   Talk with your health care provider about whether you are at high risk of being infected with HIV. If you choose to begin PrEP, you should first be tested for HIV. You should then be tested every 3 months for as long as you are taking PrEP.    Contact a health care provider if:   See your health care provider.   Tell your sexual partner(s). They should be tested and treated for any STDs.   Do not have sex until your health care provider says it is okay.  Get help right away if:  Contact your health care provider right away if:   You have severe abdominal pain.   You are a man and notice swelling or pain in your testicles.   You are a woman and notice swelling or pain in your vagina.    This information is not intended to replace advice given to you by your health care provider. Make sure you discuss any questions you have with your health care provider.  Document Released: 10/24/2002 Document Revised: 02/21/2016 Document Reviewed: 02/21/2013  Elsevier Interactive Patient Education  2018 [employer]

## 2018-05-11 NOTE — Progress Notes (Signed)
Vaginal Exam-STI Testing  Subjective:    Patient ID: Kellie Shaffer, female    DOB: 01-15-1991, 27 y.o.   MRN: [id number]  Chief Complaint  Patient presents with  . STI Testing  . Vaginal Bleeding    HPI Kellie Shaffer is a 27 year old female with a past medical history of Ovarian Cyst, Iron Deficiency Anemia, Hyperthyroidism, Hypertension, Hypercholesteremia, History of Heart Palpitations, Diabetes, and Anemia. She is here today for STI testing.   Current Status: Patient here for STD testing.  She has previously had routine Pap smears. Patient states that she has never had an abnormal Pap smear.  She is nullipar. She is sexually active. She denies abnormal vaginal discharge, vaginal  itching, vaginal burning, or dyspareunia. She has had a few incidents of abnormal bleeding after intercourse (postcoital bleeding). Patient states that she does not perform monthly self breast exams. She has a  family history of breast cancer, mother. She typically follows a balanced diet but does not exercise routinely. Body mass index is 35.15.  Gynecologic History Patient's last menstrual period was 04/22/2018.  Obstetric History        OB History  Gravida Para Term Preterm AB Living  0 0 0 0 0 0  SAB TAB Ectopic Multiple Live Births   0 0 0 0 0    Her last menstrual period was the first of September.    Regular periods usually last about 7 days. Heavy bleeding and occasional increased cramping.  No discharge, discomfort, abdominal pain.  She is sexually active, and she does not use birth control.   Review of Systems  Respiratory: Negative.   Cardiovascular: Negative.   Gastrointestinal: Negative.   Musculoskeletal: Negative.   Neurological: Negative.   Psychiatric/Behavioral: Negative.    Objective:   Physical Exam  Constitutional: She appears well-developed and well-nourished.  Cardiovascular: Normal rate, regular rhythm, normal heart sounds and intact distal pulses.   Pulmonary/Chest: Effort normal and breath sounds normal.  Abdominal: Soft. Bowel sounds are normal.  Psychiatric: She has a normal mood and affect. Her behavior is normal. Judgment and thought content normal.  Nursing note and vitals reviewed.  Assessment & Plan:   1. Possible exposure to STD - Pap IG w/ reflex to HPV when ASC-U ([facility]) - Chlamydia/Gonococcus/Trichomonas, NAA  2. Vaginal itching - Pap IG w/ reflex to HPV when ASC-U ([facility]) - Chlamydia/Gonococcus/Trichomonas, NAA  3. Follow up He will follow up in 3 months.   No orders of the defined types were placed in this encounter.   Raliegh Ip,  MSN, FNP-C Patient Care [facility] [address] [place], [place] [address] [phone]

## 2018-05-14 LAB — CHLAMYDIA/GONOCOCCUS/TRICHOMONAS, NAA
Chlamydia by NAA: NEGATIVE
Gonococcus by NAA: NEGATIVE
Trich vag by NAA: NEGATIVE

## 2018-05-16 MED FILL — LISINOPRIL 5 MG TAB: 5 | 30 days supply | Qty: 30 | Fill #1

## 2018-05-16 MED FILL — ATORVASTATIN CALCIUM 40 MG: 40 | 30 days supply | Qty: 30 | Fill #8

## 2018-05-16 MED FILL — METOPROLOL TARTRATE 25 MG T: 25 | 30 days supply | Qty: 15 | Fill #8

## 2018-05-18 ENCOUNTER — Telehealth: Payer: Self-pay

## 2018-05-18 NOTE — Telephone Encounter (Signed)
Patient notified

## 2018-05-18 NOTE — Telephone Encounter (Signed)
-----   Message from [name], FNP sent at 05/17/2018  7:19 PM EDT ----- Regarding: "STD Results" Inform patient that results of STD panel is negative. No antibiotic is needed. Keep follow up appointment as scheduled.    Thank you.

## 2018-05-19 LAB — PAP IG W/ RFLX HPV ASCU: PAP Smear Comment: 0

## 2018-05-20 ENCOUNTER — Other Ambulatory Visit: Payer: Self-pay | Admitting: Family Medicine

## 2018-05-20 DIAGNOSIS — R87612 Low grade squamous intraepithelial lesion on cytologic smear of cervix (LGSIL): Secondary | ICD-10-CM

## 2018-05-20 NOTE — Progress Notes (Signed)
Abnormal Pap revealing LSIL. Referral sent to GYN for Colposcopy. Patient is aware.

## 2018-05-26 ENCOUNTER — Inpatient Hospital Stay: Payer: Managed Care, Other (non HMO) | Attending: Internal Medicine | Admitting: Internal Medicine

## 2018-05-26 ENCOUNTER — Encounter: Payer: Self-pay | Admitting: Internal Medicine

## 2018-05-26 ENCOUNTER — Telehealth: Payer: Self-pay | Admitting: Internal Medicine

## 2018-05-26 ENCOUNTER — Telehealth: Payer: Self-pay | Admitting: Obstetrics and Gynecology

## 2018-05-26 ENCOUNTER — Telehealth: Payer: Self-pay

## 2018-05-26 ENCOUNTER — Inpatient Hospital Stay: Payer: Managed Care, Other (non HMO)

## 2018-05-26 VITALS — BP 136/84 | HR 71 | Temp 98.3°F | Resp 17 | Ht 69.0 in | Wt 239.2 lb

## 2018-05-26 DIAGNOSIS — D500 Iron deficiency anemia secondary to blood loss (chronic): Secondary | ICD-10-CM | POA: Diagnosis not present

## 2018-05-26 DIAGNOSIS — D693 Immune thrombocytopenic purpura: Secondary | ICD-10-CM | POA: Insufficient documentation

## 2018-05-26 DIAGNOSIS — E119 Type 2 diabetes mellitus without complications: Secondary | ICD-10-CM | POA: Diagnosis not present

## 2018-05-26 DIAGNOSIS — D696 Thrombocytopenia, unspecified: Secondary | ICD-10-CM

## 2018-05-26 DIAGNOSIS — E0590 Thyrotoxicosis, unspecified without thyrotoxic crisis or storm: Secondary | ICD-10-CM | POA: Insufficient documentation

## 2018-05-26 DIAGNOSIS — N920 Excessive and frequent menstruation with regular cycle: Secondary | ICD-10-CM

## 2018-05-26 DIAGNOSIS — I10 Essential (primary) hypertension: Secondary | ICD-10-CM | POA: Diagnosis not present

## 2018-05-26 DIAGNOSIS — Z79899 Other long term (current) drug therapy: Secondary | ICD-10-CM | POA: Diagnosis not present

## 2018-05-26 LAB — CMP (CANCER CENTER ONLY)
ALBUMIN: 3.9 g/dL (ref 3.5–5.0)
ALK PHOS: 73 U/L (ref 38–126)
ALT: 37 U/L (ref 0–44)
ANION GAP: 10 (ref 5–15)
AST: 19 U/L (ref 15–41)
BILIRUBIN TOTAL: 0.4 mg/dL (ref 0.3–1.2)
BUN: 11 mg/dL (ref 6–20)
CALCIUM: 9.4 mg/dL (ref 8.9–10.3)
CO2: 22 mmol/L (ref 22–32)
Chloride: 108 mmol/L (ref 98–111)
Creatinine: 0.76 mg/dL (ref 0.44–1.00)
Glucose, Bld: 114 mg/dL — ABNORMAL HIGH (ref 70–99)
POTASSIUM: 3.7 mmol/L (ref 3.5–5.1)
Sodium: 140 mmol/L (ref 135–145)
TOTAL PROTEIN: 7.7 g/dL (ref 6.5–8.1)

## 2018-05-26 LAB — CBC WITH DIFFERENTIAL (CANCER CENTER ONLY)
Abs Immature Granulocytes: 0.02 10*3/uL (ref 0.00–0.07)
BASOS ABS: 0 10*3/uL (ref 0.0–0.1)
Basophils Relative: 0 %
EOS ABS: 0.1 10*3/uL (ref 0.0–0.5)
EOS PCT: 2 %
HEMATOCRIT: 35.9 % — AB (ref 36.0–46.0)
HEMOGLOBIN: 11.4 g/dL — AB (ref 12.0–15.0)
Immature Granulocytes: 0 %
LYMPHS PCT: 38 %
Lymphs Abs: 2.4 10*3/uL (ref 0.7–4.0)
MCH: 26.5 pg (ref 26.0–34.0)
MCHC: 31.8 g/dL (ref 30.0–36.0)
MCV: 83.5 fL (ref 80.0–100.0)
MONO ABS: 0.7 10*3/uL (ref 0.1–1.0)
Monocytes Relative: 11 %
NRBC: 0 % (ref 0.0–0.2)
Neutro Abs: 3.1 10*3/uL (ref 1.7–7.7)
Neutrophils Relative %: 49 %
Platelet Count: 172 10*3/uL (ref 150–400)
RBC: 4.3 MIL/uL (ref 3.87–5.11)
RDW: 14.5 % (ref 11.5–15.5)
WBC Count: 6.4 10*3/uL (ref 4.0–10.5)

## 2018-05-26 LAB — LACTATE DEHYDROGENASE: LDH: 149 U/L (ref 98–192)

## 2018-05-26 NOTE — Telephone Encounter (Signed)
Spoke with patient and gave her information for GYN referral.

## 2018-05-26 NOTE — Telephone Encounter (Signed)
Called and left a message for patient to call back to schedule a new patient doctor referral appointment with our office to see any MD for: colposcopy, Low grade squamous intraepithelial lesion on cytologic smear of cervix (LGSIL).

## 2018-05-26 NOTE — Progress Notes (Signed)
[facility] Telephone:([phone]) [phone]   Fax:([phone]  OFFICE PROGRESS NOTE  [name], FNP [address] 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  CURRENT THERAPY: Observation.  INTERVAL HISTORY: Kellie Shaffer 27 y.o. female returns to the clinic today for follow-up visit.  The patient is feeling fine today with no concerning complaints except for fatigue.  She did not sleep last night because she returned back home from work around 3:00 in the morning.  She denied having any nausea, vomiting, diarrhea or constipation.  She has more menstrual bleeding and pain recently.  She is followed by gynecology.  She denied having any chest pain, shortness of breath, cough or hemoptysis.  She denied having any bleeding issues.  The patient is here today for evaluation and repeat blood work.  MEDICAL HISTORY: Past Medical History:  Diagnosis Date  . Abortion May 2013  . Anemia   . Diabetes mellitus without complication (HCC)   . History of ITP   . History of palpitations    Cardiac event monitor June 2019: Normal.  Sinus rhythm no arrhythmias or significant PACs/PVCs.  Marland Kitchen Hypercholesteremia   . Hypertension   . Hyperthyroidism   . Iron deficiency anemia 09/04/2016  . Ovarian cyst     ALLERGIES:  is allergic to benadryl [diphenhydramine hcl] and chloroxine.  MEDICATIONS:  Current Outpatient Medications  Medication Sig Dispense Refill  . atorvastatin (LIPITOR) 40 MG tablet Take 1 tablet (40 mg total) by mouth daily. 90 tablet 3  . Biotin 81191 MCG TABS Take 1,000 mcg by mouth daily.     . busPIRone (BUSPAR) 10 MG tablet Take 1 tablet (10 mg total) by mouth daily. 30 tablet 2  . cetirizine (ZYRTEC) 10 MG tablet Take 1 tablet (10 mg total) by mouth daily as needed for allergies. 90 tablet 1  . ibuprofen (ADVIL,MOTRIN)  600 MG tablet Take 1 tablet (600 mg total) by mouth every 8 (eight) hours as needed for moderate pain. 30 tablet 0  . IRON PO Take 1 tablet by mouth daily.     Marland Kitchen lisinopril (PRINIVIL,ZESTRIL) 5 MG tablet TAKE 1 TABLET (5 MG TOTAL) BY MOUTH DAILY. 90 tablet 1  . methimazole (TAPAZOLE) 10 MG tablet Take 1 tablet (10 mg total) by mouth daily. 90 tablet 1  . metoprolol tartrate (LOPRESSOR) 25 MG tablet Take 0.5 tablets (12.5 mg total) by mouth every morning. 90 tablet 1  . Multiple Vitamins-Minerals (MULTIVITAMIN ADULT PO) Take by mouth daily.     No current facility-administered medications for this visit.     SURGICAL HISTORY:  Past Surgical History:  Procedure Laterality Date  . APPENDECTOMY    . LAPAROSCOPIC APPENDECTOMY N/A 01/14/2017   Procedure: APPENDECTOMY LAPAROSCOPIC;  Surgeon: Claud Kelp, MD;  Location: [facility];  Service: General;  Laterality: N/A;    REVIEW OF SYSTEMS:  A comprehensive review of systems was negative except for: Constitutional: positive for fatigue   PHYSICAL EXAMINATION: General appearance: alert, cooperative, appears stated age, fatigued and no distress Head: Normocephalic, without obvious abnormality, atraumatic Neck: no adenopathy, no JVD, supple, symmetrical, trachea midline and thyroid not enlarged, symmetric, no tenderness/mass/nodules Lymph nodes: Cervical, supraclavicular, and axillary nodes normal. Resp: clear to auscultation bilaterally Back: symmetric, no curvature. ROM normal. No CVA tenderness. Cardio: regular rate and rhythm, S1, S2 normal, no murmur, click, rub  or gallop GI: soft, non-tender; bowel sounds normal; no masses,  no organomegaly Extremities: extremities normal, atraumatic, no cyanosis or edema  ECOG PERFORMANCE STATUS: 1 - Symptomatic but completely ambulatory  Blood pressure 136/84, pulse 71, temperature 98.3 F (36.8 C), temperature source Oral, resp. rate 17, height 5\' 9"  (1.753 m), weight 239 lb 3.2 oz (108.5 kg), SpO2 100  %.  LABORATORY DATA: Lab Results  Component Value Date   WBC 6.4 05/26/2018   HGB 11.4 (L) 05/26/2018   HCT 35.9 (L) 05/26/2018   MCV 83.5 05/26/2018   PLT 172 05/26/2018      Chemistry      Component Value Date/Time   NA 140 01/30/2018 0312   NA 139 01/12/2018 1522   NA 132 (L) 08/13/2017 1044   K 4.4 01/30/2018 0312   K 4.3 08/13/2017 1044   CL 108 01/30/2018 0312   CO2 23 01/30/2018 0312   CO2 25 08/13/2017 1044   BUN 14 01/30/2018 0312   BUN 9 01/12/2018 1522   BUN 18.6 08/13/2017 1044   CREATININE 0.74 01/30/2018 0312   CREATININE 0.86 11/23/2017 1322   CREATININE 1.3 (H) 08/13/2017 1044   GLU 517 (H) 08/13/2017 1257      Component Value Date/Time   CALCIUM 9.1 01/30/2018 0312   CALCIUM 9.9 08/13/2017 1044   ALKPHOS 65 11/23/2017 1322   ALKPHOS 100 08/13/2017 1044   AST 13 11/23/2017 1322   AST 5 08/13/2017 1044   ALT 22 11/23/2017 1322   ALT 15 08/13/2017 1044   BILITOT 0.7 11/23/2017 1322   BILITOT 0.52 08/13/2017 1044       RADIOGRAPHIC STUDIES: No results found.  ASSESSMENT AND PLAN: This is a very pleasant 27years old African-American female with: 1) idiopathic thrombocytopenic purpura: She was treated a few months ago with a taper dose of prednisone because of the significant thrombocytopenia.  The patient is feeling fine today with no concerning complaints except for mild fatigue secondary to iron deficiency anemia from menorrhagia.  Her platelets count are within the normal range. She will continue on oral iron tablets for the iron deficiency anemia. 2) hyperthyroidism: She will continue her routine follow-up visit by her endocrinologist. I recommended for the patient to continue on observation for now with repeat CBC, complaints metabolic panel and LDH in 6 months. She was advised to call immediately if she has any concerning symptoms in the interval.  The patient voices understanding of current disease status and treatment options and is in  agreement with the current care plan.  All questions were answered. The patient knows to call the clinic with any problems, questions or concerns. We can certainly see the patient much sooner if necessary.  Disclaimer: This note was dictated with voice recognition software. Similar sounding words can inadvertently be transcribed and may not be corrected upon review.

## 2018-05-26 NOTE — Telephone Encounter (Signed)
Appts scheduled avs/calendar printed per 10/10 los °

## 2018-06-03 ENCOUNTER — Other Ambulatory Visit: Payer: Self-pay

## 2018-06-03 ENCOUNTER — Encounter: Payer: Self-pay | Admitting: Obstetrics & Gynecology

## 2018-06-03 ENCOUNTER — Ambulatory Visit (INDEPENDENT_AMBULATORY_CARE_PROVIDER_SITE_OTHER): Payer: Managed Care, Other (non HMO) | Admitting: Obstetrics & Gynecology

## 2018-06-03 VITALS — BP 144/72 | Resp 16 | Ht 69.0 in | Wt 241.8 lb

## 2018-06-03 DIAGNOSIS — Z01812 Encounter for preprocedural laboratory examination: Secondary | ICD-10-CM | POA: Diagnosis not present

## 2018-06-03 DIAGNOSIS — E1110 Type 2 diabetes mellitus with ketoacidosis without coma: Secondary | ICD-10-CM

## 2018-06-03 DIAGNOSIS — R87612 Low grade squamous intraepithelial lesion on cytologic smear of cervix (LGSIL): Secondary | ICD-10-CM

## 2018-06-03 DIAGNOSIS — N920 Excessive and frequent menstruation with regular cycle: Secondary | ICD-10-CM

## 2018-06-03 LAB — POCT URINE PREGNANCY: Preg Test, Ur: NEGATIVE

## 2018-06-03 MED ORDER — NORETHIN-ETH ESTRAD-FE BIPHAS 1 MG-10 MCG / 10 MCG PO TABS: 1.0000 | ORAL_TABLET | Freq: Every day | ORAL | 3 refills | Status: DC

## 2018-06-03 MED FILL — busPIRone HCL 10 MG TABS: 10 | 30 days supply | Qty: 30 | Fill #1

## 2018-06-03 NOTE — Progress Notes (Signed)
GYNECOLOGY  VISIT  CC:   Referral for evaluation of abnormal pap smear  HPI: 27 y.o. G1P0010 Single Black or [place] American female here as new patient for evaluation of abnormal pap smear.  Pt reports she had an abnormal pap smear several years ago but did not have follow up until recently.  On 05/11/18, she did have a pap smear showing LGSIL findings.  This was done in follow up from 01/03/18 pap smear that also showed LGSIL findings.  These were both done with PCP:  Raliegh Ip, FNP.   Also wants to discuss cycles.  Reports her first two days of cycles are very heavy.  Flow improves after the first two days.  Flow lasts 7 days in  total.  Is passing some clots and these are getting larger.  She is having more pain with her cycles as well.  Pain seems to be worse when flow is heavier and clotting is more significant.  Was on birth control in the past.  Was on Loloestrin in the past.  Had some issues with elevated blood pressures when on higher estrogen doses.  Reports she was started on antihypertensives at that time and never stopped them.  Denies migraines.  No h/o DVT/PE.  Smokes 1/4 ppd.  GYNECOLOGIC HISTORY: Patient's last menstrual period was 05/25/2018 (exact date). Contraception: condoms every time Menopausal hormone therapy: none  Patient Active Problem List   Diagnosis Date Noted  . Palpitations 01/12/2018  . Elevated troponin 01/12/2018  . DKA (diabetic ketoacidoses) (HCC) 08/27/2017  . Hyperglycemia 08/13/2017  . Oral candidiasis 08/13/2017  . Hypokalemia 07/19/2017  . Acute appendicitis 01/14/2017  . Iron deficiency anemia 09/04/2016  . Hyperthyroidism 07/05/2014  . Thrombocytopenia (HCC) 05/27/2014  . Acute appendicitis, uncomplicated 05/26/2014  . Hypercholesteremia     Past Medical History:  Diagnosis Date  . Abnormal Pap smear of cervix 2019   LSIL  . Abortion May 2013  . Anemia   . Diabetes mellitus without complication (HCC)   . History of ITP   .  History of palpitations    Cardiac event monitor June 2019: Normal.  Sinus rhythm no arrhythmias or significant PACs/PVCs.  Marland Kitchen Hypercholesteremia   . Hypertension   . Hyperthyroidism   . Iron deficiency anemia 09/04/2016  . Ovarian cyst     Past Surgical History:  Procedure Laterality Date  . APPENDECTOMY    . LAPAROSCOPIC APPENDECTOMY N/A 01/14/2017   Procedure: APPENDECTOMY LAPAROSCOPIC;  Surgeon: Claud Kelp, MD;  Location: [facility];  Service: General;  Laterality: N/A;    MEDS:   Current Outpatient Medications on File Prior to Visit  Medication Sig Dispense Refill  . atorvastatin (LIPITOR) 40 MG tablet Take 1 tablet (40 mg total) by mouth daily. 90 tablet 3  . Biotin 16109 MCG TABS Take 1,000 mcg by mouth daily.     . busPIRone (BUSPAR) 10 MG tablet Take 1 tablet (10 mg total) by mouth daily. 30 tablet 2  . cetirizine (ZYRTEC) 10 MG tablet Take 1 tablet (10 mg total) by mouth daily as needed for allergies. 90 tablet 1  . ibuprofen (ADVIL,MOTRIN) 600 MG tablet Take 1 tablet (600 mg total) by mouth every 8 (eight) hours as needed for moderate pain. 30 tablet 0  . IRON PO Take 1 tablet by mouth daily.     Marland Kitchen lisinopril (PRINIVIL,ZESTRIL) 5 MG tablet TAKE 1 TABLET (5 MG TOTAL) BY MOUTH DAILY. 90 tablet 1  . methimazole (TAPAZOLE) 10 MG tablet Take 1 tablet (10  mg total) by mouth daily. 90 tablet 1  . metoprolol tartrate (LOPRESSOR) 25 MG tablet Take 0.5 tablets (12.5 mg total) by mouth every morning. 90 tablet 1  . Multiple Vitamins-Minerals (MULTIVITAMIN ADULT PO) Take by mouth daily.     No current facility-administered medications on file prior to visit.     ALLERGIES: Benadryl [diphenhydramine hcl] and Chloroxine  Family History  Problem Relation Age of Onset  . Diabetes Other        Parent  . Cancer Other        Breast Cancer-Parent  . Hypertension Other        Parent  . Hyperlipidemia Other        Parent  . Arthritis Other        Parent  . Breast cancer Mother      SH:  Single, smokes 1/4 ppd  Review of Systems  All other systems reviewed and are negative.   PHYSICAL EXAMINATION:    BP (!) 144/72 (BP Location: Left Arm, Patient Position: Sitting, Cuff Size: Large)   Resp 16   Ht 5\' 9"  (1.753 m)   Wt 241 lb 12.8 oz (109.7 kg)   LMP 05/25/2018 (Exact Date)   BMI 35.71 kg/m     General appearance: alert, cooperative and appears stated age Lymph:  no inguinal LAD noted  Pelvic: External genitalia:  no lesions              Urethra:  normal appearing urethra with no masses, tenderness or lesions              Bartholins and Skenes: normal                 Vagina: normal appearing vagina with normal color and discharge, no lesions              Cervix: no lesions              Bimanual Exam:  Uterus:  normal size, contour, position, consistency, mobility, non-tender              Adnexa: no mass, fullness, tenderness  Procedure:  Speculum placed.  Cervix visualized with colposcope.  3% acetic acid applied to cervix for >90 seconds before visualization with 7.5X and 15X magnification.  Ectocervical transition noted.  Green filter used as well.  A vessel on the superficial portion of the cervix was noted at 6 o'clock running downward on the cervix.  Two areas of AWE noted at 1 and 6 o'clock.  Lugol's solution then applied.  Same area showed decreased staining.  Biopsies obtained at 6 o'clock and then 1 o'clock as well as ECC.  Monsel's solution applied for excellent hemostasis.  Pt tolerated procedure well.    Chaperone was present for exam.  Assessment: LGSIL pap smear, with hx of abnormal pap smear a few years ago Hypertension Hyperthyroidism, followed by Dr. Everardo All Menorrhagia Diabetes, type 2. Thrombocytopenia, followed by Dr. Arbutus Ped Smoker  Plan: Biopsies and ECC obtained today.  Results will be called to the patient and follow-up will be planned. Initially felt restarting Loloestrin was reasonable but upon further chart review, feel  progesterone only methods will be safer for pt.  Not currently interested in IUD (did have negative STD testing in 9/19).  Will start Micronor daily.  Pt will return in 3 months for recheck of cycles as well.  Advised to call with any worsening changes as would proceed with ultrasound at that time.   ~30 minutes  spent with patient >50% of time was in face to face discussion of above.  This was in addition to her colposcopy that was performed same day.

## 2018-06-06 ENCOUNTER — Encounter: Payer: Self-pay | Admitting: Obstetrics & Gynecology

## 2018-06-06 MED FILL — LO LOESTRIN FE 1-10 TABLET: 1 MG-10 MCG | 28 days supply | Qty: 28 | Fill #0

## 2018-06-08 ENCOUNTER — Telehealth: Payer: Self-pay | Admitting: *Deleted

## 2018-06-08 DIAGNOSIS — N871 Moderate cervical dysplasia: Secondary | ICD-10-CM

## 2018-06-08 DIAGNOSIS — N870 Mild cervical dysplasia: Secondary | ICD-10-CM

## 2018-06-08 NOTE — Telephone Encounter (Signed)
Notes recorded by Leda Min, RN on 06/08/2018 at 12:10 PM EDT Left message to call Noreene Larsson, RN at [facility] [phone].

## 2018-06-08 NOTE — Telephone Encounter (Signed)
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  LEEP is recommended for treatment.  Ok to proceed with scheduling.   CC:  Gara Kroner, CMA

## 2018-06-10 MED FILL — METOPROLOL TARTRATE 25 MG T: 25 | 30 days supply | Qty: 15 | Fill #9

## 2018-06-10 MED FILL — ATORVASTATIN 40 MG TABLET: 40 | 30 days supply | Qty: 30 | Fill #9

## 2018-06-14 NOTE — Telephone Encounter (Signed)
Left message to call Mckale Haffey, RN at [facility] [phone].   

## 2018-06-16 MED FILL — LISINOPRIL 5 MG TABLET: 5 | 30 days supply | Qty: 30 | Fill #2

## 2018-06-16 NOTE — Telephone Encounter (Signed)
Spoke with patient, advised as seen below per Dr. Hyacinth Meeker. Brief explanation of LEEP provided, questions answered. Order placed for LEEP wth colpo for precert.   LMP 05/25/18. Patient has not yet started POP. SA, condoms for contraceptive.   Instructed patient to return call to office on first day of menses to schedule LEEP.   Patient verbalizes understanding and is agreeable.   1 mo recall placed. Will keep in results to follow.  Routing to provider for final review. Patient is agreeable to disposition. Will close encounter.  Cc: Harland Dingwall, [place]

## 2018-06-21 ENCOUNTER — Telehealth: Payer: Self-pay | Admitting: Obstetrics & Gynecology

## 2018-06-21 NOTE — Telephone Encounter (Signed)
Call placed to convey benefits. 

## 2018-06-24 ENCOUNTER — Telehealth: Payer: Self-pay | Admitting: *Deleted

## 2018-06-24 NOTE — Telephone Encounter (Signed)
Patient returning call to go over benefits. Her cycle started yesterday and she wanted to schedule procedure.

## 2018-06-28 ENCOUNTER — Other Ambulatory Visit: Payer: Self-pay

## 2018-06-28 ENCOUNTER — Ambulatory Visit (INDEPENDENT_AMBULATORY_CARE_PROVIDER_SITE_OTHER): Payer: Managed Care, Other (non HMO) | Admitting: Obstetrics & Gynecology

## 2018-06-28 ENCOUNTER — Encounter: Payer: Self-pay | Admitting: Obstetrics & Gynecology

## 2018-06-28 VITALS — BP 130/80 | HR 88 | Resp 16 | Ht 69.0 in | Wt 241.0 lb

## 2018-06-28 DIAGNOSIS — Z01812 Encounter for preprocedural laboratory examination: Secondary | ICD-10-CM

## 2018-06-28 DIAGNOSIS — N870 Mild cervical dysplasia: Secondary | ICD-10-CM

## 2018-06-28 DIAGNOSIS — N871 Moderate cervical dysplasia: Secondary | ICD-10-CM | POA: Diagnosis not present

## 2018-06-28 DIAGNOSIS — R87612 Low grade squamous intraepithelial lesion on cytologic smear of cervix (LGSIL): Secondary | ICD-10-CM

## 2018-06-28 LAB — POCT URINE PREGNANCY: Preg Test, Ur: NEGATIVE

## 2018-06-28 NOTE — Patient Instructions (Signed)
LEEP Post Procedure Instructions  1. You may take Ibuprofen, Aleve or Tylenol for pain if needed, cramping is normal   2. You will have black and/or bloody discharge at first. This will lighten and turn clear before completely resolving.. This will take 2 to 3 weeks.  3. Put nothing in your vagina for 4 weeks.  4. You need to call if you have redness around the biopsy site, if there is any unusual draining, if the bleeding is heavy, or if you are concerned.  5. Shower and bathe as normal.  6. We will call you within a week with results or we will discuss the results at your follow-up appointment if needed.  7. You will need to return for a follow-up Pap smear as directed by your physician.  

## 2018-06-28 NOTE — Progress Notes (Signed)
27 y.o. 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  Pap obtained before colposcopy showed LGSIL pap with +HR HPV.  Prior important gynecologic history:  No.      Patient's last menstrual period was 06/23/2018.          Sexually active: Yes.    The current method of family planning is OCP (estrogen/progesterone).     Pre-procedure vitals: Blood pressure 130/80, pulse 88, resp. rate 16, height 5\' 9"  (1.753 m), weight 241 lb (109.3 kg), last menstrual period 06/23/2018.   Procedure explained and patient's questions were invited and answered.   Consent form signed.  Pre-procedure medication:  Motrin 800mg .  Time given:  9am  Procedure Set-up: Grounding pad located left thigh.  Cautery settings: 55 cut/55 coagulation.  Suction applied to coated speculum.  Procedure:  Speculum placed with good visualization of the cervix.  Colposcopy performed showing:  acetowhite lesion(s) noted at 1 and 6 o'clock.  Punctations noted at 6 o'clock.  Cervix anesthetized using 2% Xylocaine with 1:100,000units Epinephrine.  Lot:  [id number].  Exp: 12/20.  10 cc's used.  Entire transition zone excised with 12x 15mm loop in 3 passes.  Specimen(s) placed on cork and labeled for pathology.  ECC obtained above LEEP specimen.  Pink pin at 3, blue pin at 9 and additional 6 o'clock specimen marked with purple pin.  Hemostasis obtained with ball cautery and Monsel's solution.  EBL:  Minimal  Complications:  none  Patient tolerated procedure well and left the office in satisfactory condition.  Plan:  After visit summary given.  Repeat pap planned depending on results. Follow up 1 month with pelvic rest until that time.

## 2018-06-30 MED FILL — busPIRone HCL 10 MG TABS: 10 | 30 days supply | Qty: 30 | Fill #2

## 2018-06-30 MED FILL — methIMAzole 10 MG TABS: 10 | 30 days supply | Qty: 60 | Fill #5

## 2018-07-08 ENCOUNTER — Ambulatory Visit: Payer: Managed Care, Other (non HMO) | Admitting: Family Medicine

## 2018-07-13 MED FILL — METOPROLOL TARTRATE 25 MG T: 25 | 30 days supply | Qty: 15 | Fill #10

## 2018-07-13 MED FILL — ATORVASTATIN 40 MG TABLET: 40 | 30 days supply | Qty: 30 | Fill #10

## 2018-07-13 MED FILL — LO LOESTRIN FE 1-10 TABLET: 1 MG-10 MCG | 28 days supply | Qty: 28 | Fill #1

## 2018-07-13 MED FILL — LISINOPRIL 5 MG TAB: 5 | 30 days supply | Qty: 30 | Fill #3

## 2018-07-28 ENCOUNTER — Ambulatory Visit (INDEPENDENT_AMBULATORY_CARE_PROVIDER_SITE_OTHER): Payer: Managed Care, Other (non HMO) | Admitting: Obstetrics & Gynecology

## 2018-07-28 ENCOUNTER — Other Ambulatory Visit: Payer: Self-pay | Admitting: Obstetrics & Gynecology

## 2018-07-28 ENCOUNTER — Encounter: Payer: Self-pay | Admitting: Obstetrics & Gynecology

## 2018-07-28 ENCOUNTER — Encounter: Payer: Self-pay | Admitting: Neurology

## 2018-07-28 VITALS — BP 120/66 | HR 88 | Resp 16 | Ht 69.0 in | Wt 244.0 lb

## 2018-07-28 DIAGNOSIS — N898 Other specified noninflammatory disorders of vagina: Secondary | ICD-10-CM

## 2018-07-28 DIAGNOSIS — N871 Moderate cervical dysplasia: Secondary | ICD-10-CM

## 2018-07-28 DIAGNOSIS — G935 Compression of brain: Secondary | ICD-10-CM | POA: Diagnosis not present

## 2018-07-28 DIAGNOSIS — N912 Amenorrhea, unspecified: Secondary | ICD-10-CM

## 2018-07-28 DIAGNOSIS — D069 Carcinoma in situ of cervix, unspecified: Secondary | ICD-10-CM

## 2018-07-28 LAB — POCT URINE PREGNANCY: Preg Test, Ur: NEGATIVE

## 2018-07-28 NOTE — Progress Notes (Signed)
GYNECOLOGY  VISIT  CC:   LEEP follow up  HPI: 27 y.o. G1P0010 Single Black or [place] American female here for LEEP follow up.  Doing well.  Denies vaginal bleeding.  Is having some vaginal discharge with odor.  Denies SA since prior to LEEP procedure.  Did start loloestrin.  BP is normal today.  Having no issues with changes in headaches.  After last visit, I reviewed recent imaging and she had a brain MRI with Chiari Malformation.  Was referred to neurology but never went . Willing to go for consultation.  Did not have a cycle this month.  Would really prefer to have a cycle if possible.  Willing to stay on this for a few months to see if amenorrhea is a side effect of this pill vs just with the first month.    UPT negative today.  Pathology from LEEP reviewed.  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  Has follow up six month pap already scheduled.  Questions answered.  GYNECOLOGIC HISTORY: Patient's last menstrual period was 06/23/2018. Contraception: OCP Menopausal hormone therapy: none  Patient Active Problem List   Diagnosis Date Noted  . Palpitations 01/12/2018  . Elevated troponin 01/12/2018  . DKA (diabetic ketoacidoses) (HCC) 08/27/2017  . Hyperglycemia 08/13/2017  . Hypokalemia 07/19/2017  . Iron deficiency anemia 09/04/2016  . Hyperthyroidism 07/05/2014  . Thrombocytopenia (HCC) 05/27/2014  . Hypercholesteremia     Past Medical History:  Diagnosis Date  . Abnormal Pap smear of cervix 2019   LSIL  . Abortion May 2013  . Anemia   . Diabetes mellitus without complication (HCC)   . History of ITP   . History of palpitations    Cardiac event monitor June 2019: Normal.  Sinus rhythm no arrhythmias or significant PACs/PVCs.  Marland Kitchen Hypercholesteremia   . Hypertension   . Hyperthyroidism   . Iron deficiency anemia 09/04/2016  . Ovarian cyst     Past Surgical History:  Procedure Laterality Date  . LAPAROSCOPIC APPENDECTOMY N/A 01/14/2017   Procedure: APPENDECTOMY LAPAROSCOPIC;  Surgeon: Claud Kelp, MD;  Location: [facility];  Service: General;  Laterality: N/A;    MEDS:   Current Outpatient Medications on File Prior to Visit  Medication Sig Dispense Refill  . atorvastatin (LIPITOR) 40 MG tablet Take 1 tablet (40 mg total) by mouth daily. 90 tablet 3  . Biotin 57846 MCG TABS Take 1,000 mcg by mouth daily.     . busPIRone (BUSPAR) 10 MG tablet Take 1 tablet (10 mg total) by mouth daily. 30 tablet 2  . cetirizine (ZYRTEC) 10 MG tablet Take 1 tablet (10 mg total) by mouth daily as needed for allergies. 90 tablet 1  . ibuprofen (ADVIL,MOTRIN) 600 MG tablet Take 1 tablet (600 mg total) by mouth every 8 (eight) hours as needed for moderate pain. 30 tablet 0  . IRON PO Take 1 tablet by mouth daily.     Marland Kitchen lisinopril (PRINIVIL,ZESTRIL) 5 MG tablet TAKE 1 TABLET (5 MG TOTAL) BY MOUTH DAILY. 90 tablet 1  . LO LOESTRIN FE 1 MG-10 MCG / 10 MCG tablet Take 1 tablet by mouth daily.  3  . methimazole (TAPAZOLE) 10 MG tablet Take 1 tablet (10 mg total) by mouth daily. 90 tablet 1  . metoprolol tartrate (LOPRESSOR) 25 MG tablet Take 0.5 tablets (12.5 mg total) by mouth every morning. 90 tablet 1  . Multiple Vitamins-Minerals (MULTIVITAMIN ADULT PO) Take by mouth daily.     No  current facility-administered medications on file prior to visit.     ALLERGIES: Benadryl [diphenhydramine hcl] and Chloroxine  Family History  Problem Relation Age of Onset  . Diabetes Other        Parent  . Hypertension Other        Parent  . Hyperlipidemia Other        Parent  . Arthritis Other        Parent  . Breast cancer Mother     SH:  Single, non smoker  Review of Systems  Genitourinary: Positive for vaginal discharge.  All other systems reviewed and are negative.   PHYSICAL EXAMINATION:    BP 120/66 (BP Location: Right Arm, Patient Position: Sitting, Cuff Size: Large)   Pulse 88   Resp 16   Ht 5\' 9"  (1.753 m)   Wt 244 lb (110.7 kg)   LMP  06/23/2018   BMI 36.03 kg/m     General appearance: alert, cooperative and appears stated age Lymph:  no inguinal LAD noted  Pelvic: External genitalia:  no lesions              Urethra:  normal appearing urethra with no masses, tenderness or lesions              Bartholins and Skenes: normal                 Vagina: normal appearing vagina with normal color and discharge, no lesions              Cervix: no lesions and s/p LEEP with healing ectropion              Bimanual Exam:  Uterus:  normal size, contour, position, consistency, mobility, non-tender              Adnexa: no mass, fullness, tenderness  Chaperone was present for exam.  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.   ~15 minutes spent with patient >50% of time was in face to face discussion of above.

## 2018-07-29 ENCOUNTER — Telehealth: Payer: Self-pay | Admitting: *Deleted

## 2018-07-29 LAB — VAGINITIS/VAGINOSIS, DNA PROBE
Candida Species: NEGATIVE
GARDNERELLA VAGINALIS: POSITIVE — AB
Trichomonas vaginosis: NEGATIVE

## 2018-07-29 MED ORDER — METRONIDAZOLE 500 MG PO TABS: 500.0000 mg | ORAL_TABLET | Freq: Two times a day (BID) | ORAL | 0 refills | Status: AC

## 2018-07-29 NOTE — Telephone Encounter (Signed)
Patient notified of positive BV.   Flagyl sent for patient. Per Dr. Rondel BatonMiller's verbal order.  Encounter closed.

## 2018-08-01 ENCOUNTER — Other Ambulatory Visit: Payer: Self-pay | Admitting: Family Medicine

## 2018-08-01 DIAGNOSIS — F419 Anxiety disorder, unspecified: Secondary | ICD-10-CM

## 2018-08-01 MED FILL — metroNIDAZOLE 500 MG TABS: 500 | 7 days supply | Qty: 14 | Fill #0

## 2018-08-02 LAB — VAGINITIS/VAGINOSIS, DNA PROBE
CANDIDA SPECIES: NEGATIVE
GARDNERELLA VAGINALIS: POSITIVE — AB
Trichomonas vaginosis: NEGATIVE

## 2018-08-04 ENCOUNTER — Ambulatory Visit: Payer: Managed Care, Other (non HMO) | Admitting: Obstetrics & Gynecology

## 2018-08-04 ENCOUNTER — Other Ambulatory Visit: Payer: Self-pay

## 2018-08-04 DIAGNOSIS — F419 Anxiety disorder, unspecified: Secondary | ICD-10-CM

## 2018-08-04 MED ORDER — BUSPIRONE HCL 10 MG PO TABS: 10.0000 mg | ORAL_TABLET | Freq: Every day | ORAL | 2 refills | Status: DC

## 2018-08-04 MED FILL — busPIRone HCL 10 MG TABS: 10 | 30 days supply | Qty: 30 | Fill #0

## 2018-08-04 NOTE — Telephone Encounter (Signed)
Medication sent to pharmacy  

## 2018-08-12 MED FILL — LISINOPRIL 5 MG TAB: 5 | 30 days supply | Qty: 30 | Fill #4

## 2018-08-12 MED FILL — ATORVASTATIN CALCIUM 40 MG: 40 | 30 days supply | Qty: 30 | Fill #11

## 2018-08-12 MED FILL — LO LOESTRIN FE 1-10 TABLET: 1 MG-10 MCG | 28 days supply | Qty: 28 | Fill #2

## 2018-08-12 MED FILL — METOPROLOL TARTRATE 25 MG T: 25 | 30 days supply | Qty: 15 | Fill #11

## 2018-08-19 ENCOUNTER — Ambulatory Visit: Payer: Managed Care, Other (non HMO) | Admitting: Family Medicine

## 2018-08-24 ENCOUNTER — Ambulatory Visit: Payer: Managed Care, Other (non HMO) | Admitting: Family Medicine

## 2018-08-29 MED FILL — busPIRone HCL 10 MG TABS: 10 | 30 days supply | Qty: 30 | Fill #1

## 2018-08-29 MED FILL — methIMAzole 10 MG TABS: 10 | 30 days supply | Qty: 60 | Fill #6

## 2018-08-30 ENCOUNTER — Ambulatory Visit (INDEPENDENT_AMBULATORY_CARE_PROVIDER_SITE_OTHER): Payer: Managed Care, Other (non HMO) | Admitting: Family Medicine

## 2018-08-30 ENCOUNTER — Encounter: Payer: Self-pay | Admitting: Family Medicine

## 2018-08-30 VITALS — BP 130/68 | HR 74 | Temp 98.1°F | Ht 69.0 in | Wt 242.0 lb

## 2018-08-30 DIAGNOSIS — E119 Type 2 diabetes mellitus without complications: Secondary | ICD-10-CM | POA: Diagnosis not present

## 2018-08-30 DIAGNOSIS — I10 Essential (primary) hypertension: Secondary | ICD-10-CM

## 2018-08-30 DIAGNOSIS — D693 Immune thrombocytopenic purpura: Secondary | ICD-10-CM | POA: Diagnosis not present

## 2018-08-30 DIAGNOSIS — Z6835 Body mass index (BMI) 35.0-35.9, adult: Secondary | ICD-10-CM

## 2018-08-30 DIAGNOSIS — F419 Anxiety disorder, unspecified: Secondary | ICD-10-CM

## 2018-08-30 DIAGNOSIS — Z09 Encounter for follow-up examination after completed treatment for conditions other than malignant neoplasm: Secondary | ICD-10-CM

## 2018-08-30 DIAGNOSIS — E66812 Obesity, class 2: Secondary | ICD-10-CM

## 2018-08-30 DIAGNOSIS — H66001 Acute suppurative otitis media without spontaneous rupture of ear drum, right ear: Secondary | ICD-10-CM

## 2018-08-30 MED ORDER — AMOXICILLIN-POT CLAVULANATE 875-125 MG PO TABS: 1.0000 | ORAL_TABLET | Freq: Two times a day (BID) | ORAL | 0 refills | Status: AC

## 2018-08-30 NOTE — Progress Notes (Signed)
Follow Up  Subjective:    Patient ID: Kellie Shaffer, female    DOB: April 13, 1991, 28 y.o.   MRN: [id number]  Chief Complaint  Patient presents with  . Follow-up    chronic condition    HPI  Kellie Shaffer is a 28 year old female with a past medical history of Iron Deficiency Anemia, Hyperthyroidism, Hypertension, Palpitations, ITP, Diabetes, Anemia, and Abnormal Pap Smear. She is here today for follow up.   Current Status: Since her last office visit, she is doing well with no complaints. She denies chest pain, cough, shortness of breath, heart palpitations, and falls. She has occasionally headaches and dizziness with position changes. Denies severe headaches, confusion, seizures, double vision, and blurred vision, nausea and vomiting. She denies fatigue, frequent urination, blurred vision, excessive hunger, excessive thirst, weight gain, weight loss, and poor wound healing. She has had moderate anxiety today. She denies suicidal ideations, homicidal ideations, or auditory hallucinations. She is currently following up with Oncology for ITP. She has been following up with GYN and has began birth control.  She had c/o of pain in right ear for about 2 months ago. She has tried home remedies, but ear pain returns.   She denies discharge from her ear. She denies fevers, chills, fatigue, recent infections, weight loss, and night sweats. No reports of GI problems such as diarrhea, and constipation. She has no reports of blood in stools, dysuria and hematuria. She denies pain today.    Review of Systems  Constitutional: Negative.   HENT: Negative.   Eyes: Negative.   Respiratory: Negative.   Cardiovascular: Negative.   Gastrointestinal: Negative.   Endocrine: Negative.   Genitourinary: Negative.   Musculoskeletal: Negative.   Skin: Negative.   Allergic/Immunologic: Negative.   Neurological: Positive for dizziness and headaches.  Hematological: Negative.   Psychiatric/Behavioral: Negative.      Objective:   Physical Exam Vitals signs and nursing note reviewed.  Constitutional:      Appearance: Normal appearance. She is obese.  HENT:     Head: Normocephalic and atraumatic.     Left Ear: External ear normal.     Ears:      Nose: Nose normal.     Mouth/Throat:     Mouth: Mucous membranes are moist.     Pharynx: Oropharynx is clear.  Eyes:     Conjunctiva/sclera: Conjunctivae normal.  Neck:     Musculoskeletal: Normal range of motion and neck supple.  Cardiovascular:     Rate and Rhythm: Normal rate and regular rhythm.     Pulses: Normal pulses.     Heart sounds: Normal heart sounds.  Abdominal:     General: Bowel sounds are normal. There is distension.     Palpations: Abdomen is soft.  Musculoskeletal: Normal range of motion.  Skin:    General: Skin is warm and dry.     Capillary Refill: Capillary refill takes less than 2 seconds.  Neurological:     General: No focal deficit present.     Mental Status: She is alert.  Psychiatric:        Mood and Affect: Mood normal.        Behavior: Behavior normal.        Thought Content: Thought content normal.        Judgment: Judgment normal.    Assessment & Plan:   1. Non-recurrent acute suppurative otitis media of right ear without spontaneous rupture of tympanic membrane We will initiate Augmentin today.  - amoxicillin-clavulanate (  AUGMENTIN) 875-125 MG tablet; Take 1 tablet by mouth 2 (two) times daily for 7 days.  Dispense: 14 tablet; Refill: 0  2. Hypertension, unspecified type Blood pressure is 130/68 today. Continue Lisinopril and Metoprolol as prescribed. He will continue to decrease high sodium intake, excessive alcohol intake, increase potassium intake, smoking cessation, and increase physical activity of at least 30 minutes of cardio activity daily. He will continue to follow Heart Healthy or DASH diet.  3. Type 2 diabetes mellitus without complication, without long-term current use of insulin (HCC) Hgb A1c  is stable today. She will continue to decrease foods/beverages high in sugars and carbs and follow Heart Healthy or DASH diet. Increase physical activity to at least 30 minutes cardio exercise daily.  - HgB A1c  4. Idiopathic thrombocytopenic purpura (ITP) (HCC) She will continue surveillance of ITP with Hematologist as needed. Monitor.   5. Anxiety Stable. She will continue Buspar as prescribed.   6. Class 2 severe obesity due to excess calories with serious comorbidity and body mass index (BMI) of 35.0 to 35.9 in adult [facility]) Body mass index is 35.74 kg/m. Goal BMI  is <30. Encouraged efforts to reduce weight include engaging in physical activity as tolerated with goal of 150 minutes per week. Improve dietary choices and eat a meal regimen consistent with a Mediterranean or DASH diet. Reduce simple carbohydrates. Do not skip meals and eat healthy snacks throughout the day to avoid over-eating at dinner. Set a goal weight loss that is achievable for you.  7. Follow up She will follow up in 6 months.  - POCT urinalysis dipstick  Meds ordered this encounter  Medications  . amoxicillin-clavulanate (AUGMENTIN) 875-125 MG tablet    Sig: Take 1 tablet by mouth 2 (two) times daily for 7 days.    Dispense:  14 tablet    Refill:  0   Raliegh Ip,  MSN, FNP-C Patient [facility] [address] [place], [place] [address] [phone]

## 2018-08-30 NOTE — Patient Instructions (Signed)
Otitis Media, Adult ° °Otitis media means that the middle ear is red and swollen (inflamed) and full of fluid. The condition usually goes away on its own. °Follow these instructions at home: °· Take over-the-counter and prescription medicines only as told by your doctor. °· If you were prescribed an antibiotic medicine, take it as told by your doctor. Do not stop taking the antibiotic even if you start to feel better. °· Keep all follow-up visits as told by your doctor. This is important. °Contact a doctor if: °· You have bleeding from your nose. °· There is a lump on your neck. °· You are not getting better in 5 days. °· You feel worse instead of better. °Get help right away if: °· You have pain that is not helped with medicine. °· You have swelling, redness, or pain around your ear. °· You get a stiff neck. °· You cannot move part of your face (paralyzed). °· You notice that the bone behind your ear hurts when you touch it. °· You get a very bad headache. °Summary °· Otitis media means that the middle ear is red, swollen, and full of fluid. °· This condition usually goes away on its own. In some cases, treatment may be needed. °· If you were prescribed an antibiotic medicine, take it as told by your doctor. °This information is not intended to replace advice given to you by your health care provider. Make sure you discuss any questions you have with your health care provider. °Document Released: 01/20/2008 Document Revised: 08/24/2016 Document Reviewed: 08/24/2016 °Elsevier Interactive Patient Education © 2019 Elsevier Inc. ° ° °Amoxicillin; Clavulanic Acid tablets °What is this medicine? °AMOXICILLIN; CLAVULANIC ACID (a mox i SIL in; KLAV yoo lan ic AS id) is a penicillin antibiotic. It is used to treat certain kinds of bacterial infections. It will not work for colds, flu, or other viral infections. °This medicine may be used for other purposes; ask your health care provider or pharmacist if you have  questions. °COMMON BRAND NAME(S): Augmentin °What should I tell my health care provider before I take this medicine? °They need to know if you have any of these conditions: °-bowel disease, like colitis °-kidney disease °-liver disease °-mononucleosis °-an unusual or allergic reaction to amoxicillin, penicillin, cephalosporin, other antibiotics, clavulanic acid, other medicines, foods, dyes, or preservatives °-pregnant or trying to get pregnant °-breast-feeding °How should I use this medicine? °Take this medicine by mouth with a full glass of water. Follow the directions on the prescription label. Take at the start of a meal. Do not crush or chew. If the tablet has a score line, you may cut it in half at the score line for easier swallowing. Take your medicine at regular intervals. Do not take your medicine more often than directed. Take all of your medicine as directed even if you think you are better. Do not skip doses or stop your medicine early. °Talk to your pediatrician regarding the use of this medicine in children. Special care may be needed. °Overdosage: If you think you have taken too much of this medicine contact a poison control center or emergency room at once. °NOTE: This medicine is only for you. Do not share this medicine with others. °What if I miss a dose? °If you miss a dose, take it as soon as you can. If it is almost time for your next dose, take only that dose. Do not take double or extra doses. °What may interact with this medicine? °-allopurinol °-anticoagulants °-  birth control pills °-methotrexate °-probenecid °This list may not describe all possible interactions. Give your health care provider a list of all the medicines, herbs, non-prescription drugs, or dietary supplements you use. Also tell them if you smoke, drink alcohol, or use illegal drugs. Some items may interact with your medicine. °What should I watch for while using this medicine? °Tell your doctor or health care professional if  your symptoms do not improve. °Do not treat diarrhea with over the counter products. Contact your doctor if you have diarrhea that lasts more than 2 days or if it is severe and watery. °If you have diabetes, you may get a false-positive result for sugar in your urine. Check with your doctor or health care professional. °Birth control pills may not work properly while you are taking this medicine. Talk to your doctor about using an extra method of birth control. °What side effects may I notice from receiving this medicine? °Side effects that you should report to your doctor or health care professional as soon as possible: °-allergic reactions like skin rash, itching or hives, swelling of the face, lips, or tongue °-breathing problems °-dark urine °-fever or chills, sore throat °-redness, blistering, peeling or loosening of the skin, including inside the mouth °-seizures °-trouble passing urine or change in the amount of urine °-unusual bleeding, bruising °-unusually weak or tired °-white patches or sores in the mouth or throat °Side effects that usually do not require medical attention (report to your doctor or health care professional if they continue or are bothersome): °-diarrhea °-dizziness °-headache °-nausea, vomiting °-stomach upset °-vaginal or anal irritation °This list may not describe all possible side effects. Call your doctor for medical advice about side effects. You may report side effects to FDA at 1-800-FDA-1088. °Where should I keep my medicine? °Keep out of the reach of children. °Store at room temperature below 25 degrees C (77 degrees F). Keep container tightly closed. Throw away any unused medicine after the expiration date. °NOTE: This sheet is a summary. It may not cover all possible information. If you have questions about this medicine, talk to your doctor, pharmacist, or health care provider. °© 2019 Elsevier/Gold Standard (2007-10-27 12:04:30) ° °

## 2018-08-31 MED FILL — AMOX-CLAV 875-125 MG TABLET: 875-125 | 7 days supply | Qty: 14 | Fill #0

## 2018-09-12 ENCOUNTER — Other Ambulatory Visit: Payer: Self-pay | Admitting: Family Medicine

## 2018-09-12 MED FILL — LISINOPRIL 5 MG TAB: 5 | 30 days supply | Qty: 30 | Fill #5

## 2018-09-13 ENCOUNTER — Ambulatory Visit (INDEPENDENT_AMBULATORY_CARE_PROVIDER_SITE_OTHER): Payer: BLUE CROSS/BLUE SHIELD | Admitting: Endocrinology

## 2018-09-13 ENCOUNTER — Telehealth: Payer: Self-pay | Admitting: Endocrinology

## 2018-09-13 ENCOUNTER — Encounter: Payer: Self-pay | Admitting: Endocrinology

## 2018-09-13 VITALS — BP 132/80 | HR 79 | Ht 69.0 in | Wt 240.0 lb

## 2018-09-13 DIAGNOSIS — E0590 Thyrotoxicosis, unspecified without thyrotoxic crisis or storm: Secondary | ICD-10-CM | POA: Diagnosis not present

## 2018-09-13 DIAGNOSIS — R739 Hyperglycemia, unspecified: Secondary | ICD-10-CM

## 2018-09-13 LAB — TSH: TSH: 2.18 u[IU]/mL (ref 0.35–4.50)

## 2018-09-13 LAB — HEMOGLOBIN A1C: HEMOGLOBIN A1C: 7.3 % — AB (ref 4.6–6.5)

## 2018-09-13 LAB — T4, FREE: Free T4: 0.97 ng/dL (ref 0.60–1.60)

## 2018-09-13 MED FILL — METOPROLOL TARTRATE 25 MG T: 25 | 30 days supply | Qty: 15 | Fill #0

## 2018-09-13 MED FILL — ATORVASTATIN 40 MG TABLET: 40 | 30 days supply | Qty: 30 | Fill #0

## 2018-09-13 NOTE — Progress Notes (Signed)
Subjective:    Patient ID: Kellie Shaffer, female    DOB: 1991/03/02, 28 y.o.   MRN: [id number]  HPI Pt returns for f/u of hyperthyroidism (dx'ed 2013, during a pregnancy; US showed a small multinodular goiter, but she also has proptosis; she was then rx'ed with tapazole; she was lost to f/u, but hyperthyroidism was found again during a hospitalization in late 2015 for appendicitis; she was restarted on tapazole then; pt says she is not at risk for another pregnancy; f/u TFT in 2018 were normal off rx; later in 2018, it was resumed due to suppressed TSH).   She says she never misses the methimazole.  pt states she feels well in general.  She says another pregnancy is possible but unlikely.   Past Medical History:  Diagnosis Date  . Abnormal Pap smear of cervix 2019   LSIL  . Abortion May 2013  . Anemia   . Diabetes mellitus without complication (HCC)   . History of ITP   . History of palpitations    Cardiac event monitor June 2019: Normal.  Sinus rhythm no arrhythmias or significant PACs/PVCs.  Marland Kitchen Hypercholesteremia   . Hypertension   . Hyperthyroidism   . Iron deficiency anemia 09/04/2016    Past Surgical History:  Procedure Laterality Date  . LAPAROSCOPIC APPENDECTOMY N/A 01/14/2017   Procedure: APPENDECTOMY LAPAROSCOPIC;  Surgeon: Claud Kelp, MD;  Location: [facility];  Service: General;  Laterality: N/A;    Social History   Socioeconomic History  . Marital status: Single    Spouse name: Not on file  . Number of children: Not on file  . Years of education: 54  . Highest education level: Not on file  Occupational History  . 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 Financial resource strain: Not on file  . Food insecurity:    Worry: Not on file    Inability: Not on file  . Transportation needs:    Medical: Not on file    Non-medical: Not on file  Tobacco Use  . Smoking status: Current Every Day Smoker    Packs/day: 0.25    Types: Cigarettes  . Smokeless  tobacco: Never Used  Substance and Sexual Activity  . Alcohol use: Yes    Alcohol/week: 1.0 standard drinks    Types: 1 Standard drinks or equivalent per week  . Drug use: No  . Sexual activity: Yes    Birth control/protection: Condom  Lifestyle  . Physical activity:    Days per week: Not on file    Minutes per session: Not on file  . Stress: Not on file  Relationships  . Social connections:    Talks on phone: Not on file    Gets together: Not on file    Attends religious service: Not on file    Active member of club or organization: Not on file    Attends meetings of clubs or organizations: Not on file    Relationship status: Not on file  . Intimate partner violence:    Fear of current or ex partner: Not on file    Emotionally abused: Not on file    Physically abused: Not on file    Forced sexual activity: Not on file  Other Topics Concern  . Not on file  Social History Narrative   Regular exercise-yes   Caffeine Use-yes          Current Outpatient Medications on File Prior to Visit  Medication Sig Dispense Refill  .  atorvastatin (LIPITOR) 40 MG tablet TAKE 1 TABLET (40 MG TOTAL) BY MOUTH DAILY. 90 tablet 3  . Biotin 45809 MCG TABS Take 1,000 mcg by mouth daily.     . busPIRone (BUSPAR) 10 MG tablet Take 1 tablet (10 mg total) by mouth daily. 30 tablet 2  . cetirizine (ZYRTEC) 10 MG tablet Take 1 tablet (10 mg total) by mouth daily as needed for allergies. (Patient taking differently: Take 10 mg by mouth daily. ) 90 tablet 1  . ibuprofen (ADVIL,MOTRIN) 600 MG tablet Take 1 tablet (600 mg total) by mouth every 8 (eight) hours as needed for moderate pain. 30 tablet 0  . IRON PO Take 1 tablet by mouth daily.     Marland Kitchen lisinopril (PRINIVIL,ZESTRIL) 5 MG tablet TAKE 1 TABLET (5 MG TOTAL) BY MOUTH DAILY. 90 tablet 1  . LO LOESTRIN FE 1 MG-10 MCG / 10 MCG tablet Take 1 tablet by mouth daily.  3  . metoprolol tartrate (LOPRESSOR) 25 MG tablet TAKE 1/2 TABLET BY MOUTH EVERY MORNING.  90 tablet 1  . Multiple Vitamins-Minerals (MULTIVITAMIN ADULT PO) Take by mouth daily.     No current facility-administered medications on file prior to visit.     Allergies  Allergen Reactions  . Benadryl [Diphenhydramine Hcl] Anaphylaxis and Hives  . Chloroxine Hives    Clorox    Family History  Problem Relation Age of Onset  . Diabetes Other        Parent  . Hypertension Other        Parent  . Hyperlipidemia Other        Parent  . Arthritis Other        Parent  . Breast cancer Mother     BP 132/80 (BP Location: Right Arm, Patient Position: Sitting, Cuff Size: Large)   Pulse 79   Ht 5\' 9"  (1.753 m)   Wt 240 lb (108.9 kg)   LMP 08/17/2018   SpO2 95%   BMI 35.44 kg/m    Review of Systems Denies fever.      Objective:   Physical Exam VITAL SIGNS:  See vs page GENERAL: no distress eyes: moderate bilat proptosis  NECK: There is no palpable thyroid enlargement.  No thyroid nodule is palpable.  No palpable lymphadenopathy at the anterior neck.  Lab Results  Component Value Date   TSH 2.18 09/13/2018      Assessment & Plan:  Hyperthyroidism: well-controlled. Palpitations: in this context, she needs to maintain euthyroidism.   Patient Instructions  Blood tests are requested for you today.  We'll let you know about the results.  It is best to never miss your methimazole. However, if you do miss a pill, take 2 the next day.  Please come back for a follow-up appointment in 3 months.  In view of your medical condition, you should avoid pregnancy until we have decided it is safe.   If ever you have fever while taking methimazole, stop it and call us, even if the reason is obvious, because of the risk of a rare side-effect.

## 2018-09-13 NOTE — Telephone Encounter (Signed)
please call patient: a1c is slightly high at 7.3%.  Please see your PCP to follow this up.

## 2018-09-13 NOTE — Patient Instructions (Addendum)
Blood tests are requested for you today.  We'll let you know about the results.  It is best to never miss your methimazole. However, if you do miss a pill, take 2 the next day.  Please come back for a follow-up appointment in 3 months.  In view of your medical condition, you should avoid pregnancy until we have decided it is safe.   If ever you have fever while taking methimazole, stop it and call us, even if the reason is obvious, because of the risk of a rare side-effect.

## 2018-09-14 NOTE — Telephone Encounter (Signed)
Pt informed

## 2018-09-15 MED ORDER — METHIMAZOLE 10 MG PO TABS: 10.0000 mg | ORAL_TABLET | Freq: Every day | ORAL | 3 refills | Status: DC

## 2018-09-15 MED FILL — LO LOESTRIN FE 1-10 TABLET: 1 MG-10 MCG | 28 days supply | Qty: 28 | Fill #3

## 2018-09-26 ENCOUNTER — Other Ambulatory Visit: Payer: Self-pay

## 2018-09-26 ENCOUNTER — Encounter (HOSPITAL_BASED_OUTPATIENT_CLINIC_OR_DEPARTMENT_OTHER): Payer: Self-pay | Admitting: *Deleted

## 2018-09-26 ENCOUNTER — Emergency Department (HOSPITAL_BASED_OUTPATIENT_CLINIC_OR_DEPARTMENT_OTHER)
Admission: EM | Admit: 2018-09-26 | Discharge: 2018-09-26 | Disposition: A | Discharge status: Home / Self Care | Payer: BLUE CROSS/BLUE SHIELD | Attending: Emergency Medicine | Admitting: Emergency Medicine

## 2018-09-26 DIAGNOSIS — F17210 Nicotine dependence, cigarettes, uncomplicated: Secondary | ICD-10-CM | POA: Diagnosis not present

## 2018-09-26 DIAGNOSIS — E119 Type 2 diabetes mellitus without complications: Secondary | ICD-10-CM | POA: Insufficient documentation

## 2018-09-26 DIAGNOSIS — J111 Influenza due to unidentified influenza virus with other respiratory manifestations: Secondary | ICD-10-CM | POA: Diagnosis not present

## 2018-09-26 DIAGNOSIS — Z79899 Other long term (current) drug therapy: Secondary | ICD-10-CM | POA: Insufficient documentation

## 2018-09-26 DIAGNOSIS — R52 Pain, unspecified: Secondary | ICD-10-CM | POA: Diagnosis not present

## 2018-09-26 DIAGNOSIS — R69 Illness, unspecified: Secondary | ICD-10-CM

## 2018-09-26 MED ORDER — IBUPROFEN 400 MG PO TABS
400.0000 mg | ORAL_TABLET | Freq: Once | ORAL | Status: AC
  Administered 2018-09-26: 400 mg via ORAL
  Filled 2018-09-26: qty 1

## 2018-09-26 MED ORDER — OSELTAMIVIR PHOSPHATE 75 MG PO CAPS: 75.0000 mg | ORAL_CAPSULE | Freq: Two times a day (BID) | ORAL | 0 refills | Status: DC

## 2018-09-26 MED ORDER — ACETAMINOPHEN 325 MG PO TABS
650.0000 mg | ORAL_TABLET | Freq: Once | ORAL | Status: AC
  Administered 2018-09-26: 650 mg via ORAL
  Filled 2018-09-26: qty 2

## 2018-09-26 NOTE — ED Provider Notes (Signed)
[facility] EMERGENCY DEPARTMENT Provider Note   CSN: [id number] Arrival date & time: 09/26/18  1639     History   Chief Complaint Chief Complaint  Patient presents with  . Fever  . Generalized Body Aches    HPI Kellie Shaffer is a 28 y.o. female who presents with flulike illness.  Past medical history significant for diabetes, history of ITP, anemia, high cholesterol, hypertension, thyroid disorder.  Patient states she had acute onset of fever, chills, body aches, coughing starting yesterday.  She is with her significant other who has had the flu as well.  She has not had a flu shot this year.  She denies severe headache, ear pain, sore throat, chest pain, shortness of breath, abdominal pain.  She has been taking ibuprofen and Tylenol without significant relief of her body aches.  HPI  Past Medical History:  Diagnosis Date  . Abnormal Pap smear of cervix 2019   LSIL  . Abortion May 2013  . Anemia   . Diabetes mellitus without complication (HCC)   . History of ITP   . History of palpitations    Cardiac event monitor June 2019: Normal.  Sinus rhythm no arrhythmias or significant PACs/PVCs.  Marland Kitchen. Hypercholesteremia   . Hypertension   . Hyperthyroidism   . Iron deficiency anemia 09/04/2016    Patient Active Problem List   Diagnosis Date Noted  . CIN II (cervical intraepithelial neoplasia II) 07/28/2018  . 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 Palpitations 01/12/2018  . Elevated troponin 01/12/2018  . DKA (diabetic ketoacidoses) (HCC) 08/27/2017  . Hyperglycemia 08/13/2017  . Iron deficiency anemia 09/04/2016  . Hyperthyroidism 07/05/2014  . Thrombocytopenia (HCC) 05/27/2014  . Hypercholesteremia     Past Surgical History:  Procedure Laterality Date  . LAPAROSCOPIC APPENDECTOMY N/A 01/14/2017   Procedure: APPENDECTOMY LAPAROSCOPIC;  Surgeon: Claud KelpIngram, Haywood, MD;  Location: [facility];  Service: General;   Laterality: N/A;     OB History    Gravida  1   Para      Term      Preterm      AB  1   Living        SAB      TAB  1   Ectopic      Multiple      Live Births               Home Medications    Prior to Admission medications   Medication Sig Start Date End Date Taking? Authorizing Provider  atorvastatin (LIPITOR) 40 MG tablet TAKE 1 TABLET (40 MG TOTAL) BY MOUTH DAILY. 09/12/18  Yes Kallie LocksStroud, Natalie M, FNP  Biotin [id number] MCG TABS Take 1,000 mcg by mouth daily.     [provider]  busPIRone (BUSPAR) 10 MG tablet Take 1 tablet (10 mg total) by mouth daily. 08/04/18   Kallie LocksStroud, Natalie M, FNP  cetirizine (ZYRTEC) 10 MG tablet Take 1 tablet (10 mg total) by mouth daily as needed for allergies. Patient taking differently: Take 10 mg by mouth daily.  09/10/17   Bing NeighborsHarris, Kimberly S, FNP  ibuprofen (ADVIL,MOTRIN) 600 MG tablet Take 1 tablet (600 mg total) by mouth every 8 (eight) hours as needed for moderate pain. 04/09/18   Shaune PollackIsaacs, Cameron, MD  IRON PO Take 1 tablet by mouth daily.     [provider]  lisinopril (PRINIVIL,ZESTRIL) 5 MG tablet TAKE 1 TABLET (5 MG TOTAL) BY MOUTH DAILY. 04/11/18  Raliegh Ip M, FNP  LO LOESTRIN FE 1 MG-10 MCG / 10 MCG tablet Take 1 tablet by mouth daily. 06/06/18   [provider]  methimazole (TAPAZOLE) 10 MG tablet Take 1 tablet (10 mg total) by mouth daily. 09/15/18   Romero Belling, MD  metoprolol tartrate (LOPRESSOR) 25 MG tablet TAKE 1/2 TABLET BY MOUTH EVERY MORNING. 09/12/18   [name], FNP  Multiple Vitamins-Minerals (MULTIVITAMIN ADULT PO) Take by mouth daily.    [provider]    Family History Family History  Problem Relation Age of Onset  . Diabetes Other        Parent  . Hypertension Other        Parent  . Hyperlipidemia Other        Parent  . Arthritis Other        Parent  . Breast cancer Mother     Social History Social History   Tobacco Use  . Smoking status: Current  Every Day Smoker    Packs/day: 0.25    Types: Cigarettes  . Smokeless tobacco: Never Used  Substance Use Topics  . Alcohol use: Yes    Alcohol/week: 1.0 standard drinks    Types: 1 Standard drinks or equivalent per week  . Drug use: No     Allergies   Benadryl [diphenhydramine hcl] and Chloroxine   Review of Systems Review of Systems  Constitutional: Positive for chills and fever.  HENT: Negative for congestion, rhinorrhea and sore throat.   Respiratory: Positive for cough. Negative for shortness of breath.   Cardiovascular: Negative for chest pain.  Gastrointestinal: Negative for abdominal pain.  Musculoskeletal: Positive for myalgias. Negative for back pain.  Neurological: Negative for headaches.     Physical Exam Updated Vital Signs BP (!) 145/92 (BP Location: Left Arm)   Pulse (!) 126   Temp (!) 100.8 F (38.2 C) (Oral) Comment: RN Jasmine December informed  Resp 20   Ht 5\' 9"  (1.753 m)   Wt 109.5 kg   SpO2 99%   BMI 35.63 kg/m   Physical Exam Vitals signs and nursing note reviewed.  Constitutional:      General: She is in acute distress (Tearful and in pain).     Appearance: She is well-developed. She is obese. She is diaphoretic (From fever breaking). She is not ill-appearing.  HENT:     Head: Normocephalic and atraumatic.     Right Ear: Hearing, tympanic membrane, ear canal and external ear normal.     Left Ear: Hearing, tympanic membrane, ear canal and external ear normal.     Nose: Nose normal.     Mouth/Throat:     Lips: Pink.     Mouth: Mucous membranes are moist.     Pharynx: Oropharynx is clear.  Eyes:     General: No scleral icterus.       Right eye: No discharge.        Left eye: No discharge.     Conjunctiva/sclera: Conjunctivae normal.     Pupils: Pupils are equal, round, and reactive to light.  Neck:     Musculoskeletal: Normal range of motion.  Cardiovascular:     Rate and Rhythm: Regular rhythm. Tachycardia present.  Pulmonary:     Effort:  Pulmonary effort is normal. No respiratory distress.     Breath sounds: Normal breath sounds.  Abdominal:     General: There is no distension.  Skin:    General: Skin is warm.  Neurological:  Mental Status: She is alert and oriented to person, place, and time.  Psychiatric:        Behavior: Behavior normal.      ED Treatments / Results  Labs (all labs ordered are listed, but only abnormal results are displayed) Labs Reviewed - No data to display  EKG None  Radiology No results found.  Procedures Procedures (including critical care time)  Medications Ordered in ED Medications  ibuprofen (ADVIL,MOTRIN) tablet 400 mg (400 mg Oral Given 09/26/18 1652)  acetaminophen (TYLENOL) tablet 650 mg (650 mg Oral Given 09/26/18 1808)     Initial Impression / Assessment and Plan / ED Course  I have reviewed the triage vital signs and the nursing notes.  Pertinent labs & imaging results that were available during my care of the patient were reviewed by me and considered in my medical decision making (see chart for details).  28 year old female presents with flu like symptoms with fever, body aches, cough. She is febrile and tachycardic here. O2 sats are normal. She is very uncomfortable appearing, likely from fever. She was given Tylenol and Motrin with improvement in her vital signs. She was given Tamiflu and a work note. Return precautions given.  Final Clinical Impressions(s) / ED Diagnoses   Final diagnoses:  Influenza-like illness    ED Discharge Orders    None       Bethel Born, PA-C 09/27/18 0040    Alvira Monday, MD 10/01/18 [phone]

## 2018-09-26 NOTE — ED Notes (Signed)
PO fluids given

## 2018-09-26 NOTE — Discharge Instructions (Signed)
Start Tamiflu - take twice a day for 5 days Continue Ibuprofen and Tylenol for fever Please rest and drink plenty of fliuds Return if you are worsening

## 2018-09-26 NOTE — ED Triage Notes (Signed)
Fever and generalized body aches, and cough since yesterday. She took Tylenol 2 hours ago.

## 2018-09-27 MED FILL — OSELTAMIVIR PHOSPHATE 75 MG: 75 | 5 days supply | Qty: 10 | Fill #0

## 2018-10-03 MED FILL — busPIRone HCL 10 MG TABS: 10 | 30 days supply | Qty: 30 | Fill #2

## 2018-10-04 ENCOUNTER — Encounter: Payer: Self-pay | Admitting: Family Medicine

## 2018-10-04 ENCOUNTER — Ambulatory Visit (INDEPENDENT_AMBULATORY_CARE_PROVIDER_SITE_OTHER): Payer: BLUE CROSS/BLUE SHIELD | Admitting: Family Medicine

## 2018-10-04 VITALS — BP 140/78 | HR 90 | Temp 98.0°F | Ht 69.0 in | Wt 239.0 lb

## 2018-10-04 DIAGNOSIS — H109 Unspecified conjunctivitis: Secondary | ICD-10-CM

## 2018-10-04 DIAGNOSIS — Z23 Encounter for immunization: Secondary | ICD-10-CM

## 2018-10-04 DIAGNOSIS — Z09 Encounter for follow-up examination after completed treatment for conditions other than malignant neoplasm: Secondary | ICD-10-CM | POA: Diagnosis not present

## 2018-10-04 MED ORDER — CIPROFLOXACIN HCL 0.3 % OP SOLN: 1.0000 [drp] | Freq: Four times a day (QID) | OPHTHALMIC | 0 refills | Status: AC

## 2018-10-04 MED FILL — CIPROFLOXACIN HCL 0.3 % SOL: 0.3 | 7 days supply | Qty: 5 | Fill #0

## 2018-10-04 NOTE — Patient Instructions (Signed)
Ciprofloxacin eye solution What is this medicine? CIPROFLOXACIN (sip roe FLOX a sin) is a quinolone antibiotic. It is used to treat bacterial eye infections. This medicine may be used for other purposes; ask your health care provider or pharmacist if you have questions. COMMON BRAND NAME(S): Ciloxan What should I tell my health care provider before I take this medicine? They need to know if you have any of these conditions: -contact lens wearer -an unusual or allergic reaction to ciprofloxacin, other antibiotics or medicines, foods, dyes, or preservatives -pregnant or trying to get pregnant -breast-feeding How should I use this medicine? This medicine is only for use in the eye. Follow the directions on the prescription label. Wash hands before and after use. Try not to touch the tip of the dropper to anything, even your eye or fingertips. Tilt your head back slightly and pull your lower eyelid down with your index finger to form a pouch. Squeeze the prescribed number of drops into the pouch. Close the eye gently to spread the drops. Your vision may blur for a few minutes. Use your doses at regular intervals. Do not use your medicine more often than directed. Finish the full course that is prescribed even if you think your condition is better. Do not skip doses or stop your medicine early. Talk to your pediatrician regarding the use of this medicine in children. While this drug may be prescribed for children as young as newborns for selected conditions, precautions do apply. Overdosage: If you think you have taken too much of this medicine contact a poison control center or emergency room at once. NOTE: This medicine is only for you. Do not share this medicine with others. What if I miss a dose? If you miss a dose, use it as soon as you can. If it is almost time for your next dose, use only that dose. Do not use double or extra doses. What may interact with this medicine? Interactions are not  expected. Do not use any other eye products without telling your doctor or health care professional. This list may not describe all possible interactions. Give your health care provider a list of all the medicines, herbs, non-prescription drugs, or dietary supplements you use. Also tell them if you smoke, drink alcohol, or use illegal drugs. Some items may interact with your medicine. What should I watch for while using this medicine? Tell your doctor or health care professional if your symptoms do not improve in 2 to 3 days or if they get worse. If your eyes are more sensitive to light, wear sunglasses. Do not wear contact lenses while you have any signs or symptoms of an eye infection. Ask your doctor or health care professional when you can start wearing your contacts again. Stop using this medicine immediately if you notice signs of an allergic reaction. What side effects may I notice from receiving this medicine? Side effects that you should report to your doctor or health care professional as soon as possible: -allergic reactions like skin rash, itching or hives, swelling of the face, lips, or tongue -blurred vision that does not go away Side effects that usually do not require medical attention (report to your doctor or health care professional if they continue or are bothersome): -temporary blurred vision -tearing or feeling of something in the eye This list may not describe all possible side effects. Call your doctor for medical advice about side effects. You may report side effects to FDA at 1-800-FDA-1088. Where should I keep  my medicine? Keep out of the reach of children. Store at room temperature between 2 and 25 degrees C (36 and 77 degrees F). Protect from light. Throw away any unused medicine after the expiration date. NOTE: This sheet is a summary. It may not cover all possible information. If you have questions about this medicine, talk to your doctor, pharmacist, or health care  provider.  2019 Elsevier/Gold Standard (2015-11-07 11:58:38) Bacterial Conjunctivitis, Adult Bacterial conjunctivitis is an infection of your conjunctiva. This is the clear membrane that covers the white part of your eye and the inner part of your eyelid. This infection can make your eye:  Red or pink.  Itchy. This condition spreads easily from person to person (is contagious) and from one eye to the other eye. What are the causes?  This condition is caused by germs (bacteria). You may get the infection if you come into close contact with: ? A person who has the infection. ? Items that have germs on them (are contaminated), such as face towels, contact lens solution, or eye makeup. What increases the risk? You are more likely to get this condition if you:  Have contact with people who have the infection.  Wear contact lenses.  Have a sinus infection.  Have had a recent eye injury or surgery.  Have a weak body defense system (immune system).  Have dry eyes. What are the signs or symptoms?   Thick, yellowish discharge from the eye.  Tearing or watery eyes.  Itchy eyes.  Burning feeling in your eyes.  Eye redness.  Swollen eyelids.  Blurred vision. How is this treated?   Antibiotic eye drops or ointment.  Antibiotic medicine taken by mouth. This is used for infections that do not get better with drops or ointment or that last more than 10 days.  Cool, wet cloths placed on the eyes.  Artificial tears used 2-6 times a day. Follow these instructions at home: Medicines  Take or apply your antibiotic medicine as told by your doctor. Do not stop taking or applying the antibiotic even if you start to feel better.  Take or apply over-the-counter and prescription medicines only as told by your doctor.  Do not touch your eyelid with the eye-drop bottle or the ointment tube. Managing discomfort  Wipe any fluid from your eye with a warm, wet washcloth or a cotton  ball.  Place a clean, cool, wet cloth on your eye. Do this for 10-20 minutes, 3-4 times per day. General instructions  Do not wear contacts until the infection is gone. Wear glasses until your doctor says it is okay to wear contacts again.  Do not wear eye makeup until the infection is gone. Throw away old eye makeup.  Change or wash your pillowcase every day.  Do not share towels or washcloths.  Wash your hands often with soap and water. Use paper towels to dry your hands.  Do not touch or rub your eyes.  Do not drive or use heavy machinery if your vision is blurred. Contact a doctor if:  You have a fever.  You do not get better after 10 days. Get help right away if:  You have a fever and your symptoms get worse all of a sudden.  You have very bad pain when you move your eye.  Your face: ? Hurts. ? Is red. ? Is swollen.  You have sudden loss of vision. Summary  Bacterial conjunctivitis is an infection of your conjunctiva.  This infection spreads  easily from person to person.  Wash your hands often with soap and water. Use paper towels to dry your hands.  Take or apply your antibiotic medicine as told by your doctor.  Contact a doctor if you have a fever or you do not get better after 10 days. This information is not intended to replace advice given to you by your health care provider. Make sure you discuss any questions you have with your health care provider. Document Released: 05/12/2008 Document Revised: 03/09/2018 Document Reviewed: 03/09/2018 Elsevier Interactive Patient Education  2019 [employer].

## 2018-10-04 NOTE — Progress Notes (Signed)
Patient [facility]  Sick Visit  Subjective:  Patient ID: Kellie Shaffer, female    DOB: [birth year]  Age: 28 y.o. MRN: [id number]  CC:  Chief Complaint  Patient presents with  . Eye Drainage    3 days  . Otitis Media    HPI Kellie Shaffer is a 28 year old who presents for follow up.   Past Medical History:  Diagnosis Date  . Abnormal Pap smear of cervix 2019   LSIL  . Abortion May 2013  . Anemia   . Diabetes mellitus without complication ([place])   . History of ITP   . History of palpitations    Cardiac event monitor June 2019: Normal.  Sinus rhythm no arrhythmias or significant PACs/PVCs.  Marland Kitchen Hypercholesteremia   . Hypertension   . Hyperthyroidism   . Iron deficiency anemia 09/04/2016   Current Status: Since her last office visit, she states that she has been having drainage, redness, itching, and morning crusting in left eye X 3 days. She has not used any medication to her eye. She was recently diagnosed with Influenza A, which has resolved.   She denies fevers, chills, fatigue, weight loss, and night sweats. She has not had any headaches, visual changes, dizziness, and falls. No chest pain, heart palpitations, cough and shortness of breath reported. No reports of GI problems such as nausea, vomiting, diarrhea, and constipation. She has no reports of blood in stools, dysuria and hematuria. No depression or anxiety reported. She denies pain today.   Past Surgical History:  Procedure Laterality Date  . LAPAROSCOPIC APPENDECTOMY N/A 01/14/2017   Procedure: APPENDECTOMY LAPAROSCOPIC;  Surgeon: Fanny Skates, MD;  Location: [facility];  Service: General;  Laterality: N/A;    Family History  Problem Relation Age of Onset  . Diabetes Other        Parent  . Hypertension Other        Parent  . Hyperlipidemia Other        Parent  . Arthritis Other        Parent  . Breast cancer Mother     Social History    Socioeconomic History  . Marital status: Single    Spouse name: Not on file  . Number of children: Not on file  . Years of education: 71  . Highest education level: Not on file  Occupational History  . 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 Financial resource strain: Not on file  . Food insecurity:    Worry: Not on file    Inability: Not on file  . Transportation needs:    Medical: Not on file    Non-medical: Not on file  Tobacco Use  . Smoking status: Current Every Day Smoker    Packs/day: 0.25    Types: Cigarettes  . Smokeless tobacco: Never Used  Substance and Sexual Activity  . Alcohol use: Yes    Alcohol/week: 1.0 standard drinks    Types: 1 Standard drinks or equivalent per week  . Drug use: No  . Sexual activity: Yes    Birth control/protection: Condom  Lifestyle  . Physical activity:    Days per week: Not on file    Minutes per session: Not on file  . Stress: Not on file  Relationships  . Social connections:    Talks on phone: Not on file    Gets together: Not on file    Attends religious  service: Not on file    Active member of club or organization: Not on file    Attends meetings of clubs or organizations: Not on file    Relationship status: Not on file  . Intimate partner violence:    Fear of current or ex partner: Not on file    Emotionally abused: Not on file    Physically abused: Not on file    Forced sexual activity: Not on file  Other Topics Concern  . Not on file  Social History Narrative   Regular exercise-yes   Caffeine Use-yes          Outpatient Medications Prior to Visit  Medication Sig Dispense Refill  . atorvastatin (LIPITOR) 40 MG tablet TAKE 1 TABLET (40 MG TOTAL) BY MOUTH DAILY. 90 tablet 3  . Biotin 10000 MCG TABS Take 1,000 mcg by mouth daily.     . busPIRone (BUSPAR) 10 MG tablet Take 1 tablet (10 mg total) by mouth daily. 30 tablet 2  . cetirizine (ZYRTEC) 10 MG tablet Take 1 tablet (10 mg total) by mouth daily as  needed for allergies. (Patient taking differently: Take 10 mg by mouth daily. ) 90 tablet 1  . ibuprofen (ADVIL,MOTRIN) 600 MG tablet Take 1 tablet (600 mg total) by mouth every 8 (eight) hours as needed for moderate pain. 30 tablet 0  . IRON PO Take 1 tablet by mouth daily.     Marland Kitchen lisinopril (PRINIVIL,ZESTRIL) 5 MG tablet TAKE 1 TABLET (5 MG TOTAL) BY MOUTH DAILY. 90 tablet 1  . LO LOESTRIN FE 1 MG-10 MCG / 10 MCG tablet Take 1 tablet by mouth daily.  3  . methimazole (TAPAZOLE) 10 MG tablet Take 1 tablet (10 mg total) by mouth daily. 90 tablet 3  . metoprolol tartrate (LOPRESSOR) 25 MG tablet TAKE 1/2 TABLET BY MOUTH EVERY MORNING. 90 tablet 1  . Multiple Vitamins-Minerals (MULTIVITAMIN ADULT PO) Take by mouth daily.    Marland Kitchen oseltamivir (TAMIFLU) 75 MG capsule Take 1 capsule (75 mg total) by mouth every 12 (twelve) hours. 10 capsule 0   No facility-administered medications prior to visit.     Allergies  Allergen Reactions  . Benadryl [Diphenhydramine Hcl] Anaphylaxis and Hives  . Chloroxine Hives    Clorox    ROS Review of Systems  Constitutional: Negative.   HENT: Negative.        Right ear discomfort.   Eyes: Positive for discharge, redness and itching.       Morning left eye crusting.   Respiratory: Positive for cough (occasional ).   Cardiovascular: Negative.   Gastrointestinal: Negative.   Endocrine: Negative.   Genitourinary: Negative.   Musculoskeletal: Negative.   Skin: Negative.   Allergic/Immunologic: Negative.   Neurological: Negative.   Hematological: Negative.   Psychiatric/Behavioral: Negative.    Objective:    Physical Exam  Constitutional: She is oriented to person, place, and time. She appears well-developed and well-nourished.  HENT:  Head: Normocephalic and atraumatic.  Eyes: Conjunctivae are normal.  Neck: Normal range of motion. Neck supple.  Cardiovascular: Normal rate, regular rhythm, normal heart sounds and intact distal pulses.  Pulmonary/Chest:  Effort normal and breath sounds normal.  Abdominal: Soft. Bowel sounds are normal.  Musculoskeletal: Normal range of motion.  Neurological: She is alert and oriented to person, place, and time. She has normal reflexes.  Skin: Skin is warm and dry.  Psychiatric: She has a normal mood and affect. Her behavior is normal. Judgment and thought content normal.  Nursing note and vitals reviewed.  BP 140/78 (BP Location: Right Arm, Patient Position: Sitting, Cuff Size: Large)   Pulse 90   Temp 98 F (36.7 C) (Oral)   Ht _0  (1.753 m)   Wt 239 lb (108.4 kg)   LMP  (LMP Unknown)   SpO2 98%   BMI 35.29 kg/m     Wt Readings from Last 3 Encounters:  10/04/18 239 lb (108.4 kg)  09/26/18 241 lb 4.8 oz (109.5 kg)  09/13/18 240 lb (108.9 kg)    Health Maintenance Due  Topic Date Due  . PNEUMOCOCCAL POLYSACCHARIDE VACCINE AGE 29-64 HIGH RISK  03/10/1992  . OPHTHALMOLOGY EXAM  03/10/2000  . FOOT EXAM  09/10/2018    There are no preventive care reminders to display for this patient.  Lab Results  Component Value Date   TSH 2.18 09/13/2018   Lab Results  Component Value Date   WBC 6.4 05/26/2018   HGB 11.4 (L) 05/26/2018   HCT 35.9 (L) 05/26/2018   MCV 83.5 05/26/2018   PLT 172 05/26/2018   Lab Results  Component Value Date   NA 140 05/26/2018   K 3.7 05/26/2018   CHLORIDE 95 (L) 08/13/2017   CO2 22 05/26/2018   GLUCOSE 114 (H) 05/26/2018   BUN 11 05/26/2018   CREATININE 0.76 05/26/2018   BILITOT 0.4 05/26/2018   ALKPHOS 73 05/26/2018   AST 19 05/26/2018   ALT 37 05/26/2018   PROT 7.7 05/26/2018   ALBUMIN 3.9 05/26/2018   CALCIUM 9.4 05/26/2018   ANIONGAP 10 05/26/2018   EGFR >60 08/13/2017   Lab Results  Component Value Date   CHOL 216 (H) 08/31/2017   Lab Results  Component Value Date   HDL 35 (L) 08/31/2017   Lab Results  Component Value Date   LDLCALC 168 (H) 08/31/2017   Lab Results  Component Value Date   TRIG 67 08/31/2017   Lab Results  Component  Value Date   CHOLHDL 6.2 08/31/2017   Lab Results  Component Value Date   HGBA1C 7.3 (H) 09/13/2018   Assessment & Plan:   1. Conjunctivitis of left eye, unspecified conjunctivitis type We will initiate Ciloxan. Additional discharge instructions given, per AVS. Patient will report to office if signs or symptoms worsen or do not improved.  - ciprofloxacin (CILOXAN) 0.3 % ophthalmic solution; Place 1 drop into the left eye 4 (four) times daily for 7 days.  Dispense: 1.4 mL; Refill: 0  2. Need for immunization against influenza - Flu Vaccine QUAD 36+ mos IM  3. Follow up She will keep follow up appointment.   Meds ordered this encounter  Medications  . ciprofloxacin (CILOXAN) 0.3 % ophthalmic solution    Sig: Place 1 drop into the left eye 4 (four) times daily for 7 days.    Dispense:  1.4 mL    Refill:  0   Orders Placed This Encounter  Procedures  . Flu Vaccine QUAD 36+ mos IM   Referral Orders  No referral(s) requested today    Kathe Becton,  MSN, FNP-C Patient [address] [place], [place] [address] [phone]   Problem List Items Addressed This Visit    None    Visit Diagnoses    Conjunctivitis of left eye, unspecified conjunctivitis type    -  Primary   Relevant Medications   ciprofloxacin (CILOXAN) 0.3 % ophthalmic solution   Need for immunization against influenza  Relevant Orders   Flu Vaccine QUAD 36+ mos IM (Completed)   Follow up          Meds ordered this encounter  Medications  . ciprofloxacin (CILOXAN) 0.3 % ophthalmic solution    Sig: Place 1 drop into the left eye 4 (four) times daily for 7 days.    Dispense:  1.4 mL    Refill:  0    Follow-up: No follow-ups on file.    Azzie Glatter, FNP

## 2018-10-04 NOTE — Progress Notes (Signed)
0 0

## 2018-10-05 NOTE — Progress Notes (Deleted)
[facility] Clinic Note - Initial Visit   Date: 10/05/18  Kellie RockerMarkysha M Bainter MRN: [id number] DOB: 09-14-1990   Dear Raliegh IpNatalie Stroud, FNP:  Thank you for your kind referral of Kellie RockerMarkysha M [place] for consultation of Chiari I malformation. Although her history is well known to you, please allow us to reiterate it for the purpose of our medical record. The patient was accompanied to the clinic by *** who also provides collateral information.     History of Present Illness: Kellie Shaffer is a 28 y.o. 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  ***    Out-side paper records, electronic medical record, and images have been reviewed where available and summarized as: *** CT head 01/30/2018: 1.  No acute intracranial abnormality. 2. 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 Recommend brain MRI for further evaluation.  Lab Results  Component Value Date   TSH 2.18 09/13/2018   Lab Results  Component Value Date   HGBA1C 7.3 (H) 09/13/2018     Past Medical History:  Diagnosis Date  . Abnormal Pap smear of cervix 2019   LSIL  . Abortion May 2013  . Anemia   . Diabetes mellitus without complication ([facility])   . History of ITP   . History of palpitations    Cardiac event monitor June 2019: Normal.  Sinus rhythm no arrhythmias or significant PACs/PVCs.  Marland Kitchen. Hypercholesteremia   . Hypertension   . Hyperthyroidism   . Iron deficiency anemia 09/04/2016    Past Surgical History:  Procedure Laterality Date  . LAPAROSCOPIC APPENDECTOMY N/A 01/14/2017   Procedure: APPENDECTOMY LAPAROSCOPIC;  Surgeon: Claud KelpIngram, Haywood, MD;  Location: [facility];  Service: General;  Laterality: N/A;     Medications:  Outpatient Encounter Medications as of  10/07/2018  Medication Sig  . atorvastatin (LIPITOR) 40 MG tablet TAKE 1 TABLET (40 MG TOTAL) BY MOUTH DAILY.  Marland Kitchen. Biotin [id number] MCG TABS Take 1,000 mcg by mouth daily.   . busPIRone (BUSPAR) 10 MG tablet Take 1 tablet (10 mg total) by mouth daily.  . cetirizine (ZYRTEC) 10 MG tablet Take 1 tablet (10 mg total) by mouth daily as needed for allergies. (Patient taking differently: Take 10 mg by mouth daily. )  . ciprofloxacin (CILOXAN) 0.3 % ophthalmic solution Place 1 drop into the left eye 4 (four) times daily for 7 days.  Marland Kitchen. ibuprofen (ADVIL,MOTRIN) 600 MG tablet Take 1 tablet (600 mg total) by mouth every 8 (eight) hours as needed for moderate pain.  . IRON PO Take 1 tablet by mouth daily.   Marland Kitchen. lisinopril (PRINIVIL,ZESTRIL) 5 MG tablet TAKE 1 TABLET (5 MG TOTAL) BY MOUTH DAILY.  . LO LOESTRIN FE 1 MG-10 MCG / 10 MCG tablet Take 1 tablet by mouth daily.  . methimazole (TAPAZOLE) 10 MG tablet Take 1 tablet (10 mg total) by mouth daily.  . metoprolol tartrate (LOPRESSOR) 25 MG tablet TAKE 1/2 TABLET BY MOUTH EVERY MORNING.  . Multiple Vitamins-Minerals (MULTIVITAMIN ADULT PO) Take by mouth daily.   No facility-administered encounter medications on file as of 10/07/2018.     Allergies:  Allergies  Allergen Reactions  . Benadryl [Diphenhydramine Hcl] Anaphylaxis and Hives  . Chloroxine Hives    Clorox    Family History: Family History  Problem Relation Age of Onset  . Diabetes Other  Parent  . Hypertension Other        Parent  . Hyperlipidemia Other        Parent  . Arthritis Other        Parent  . Breast cancer Mother     Social History: Social History   Tobacco Use  . Smoking status: Current Every Day Smoker    Packs/day: 0.25    Types: Cigarettes  . Smokeless tobacco: Never Used  Substance Use Topics  . Alcohol use: Yes    Alcohol/week: 1.0 standard drinks    Types: 1 Standard drinks or equivalent per week  . Drug use: No   Social History   Social History  Narrative   Regular exercise-yes   Caffeine Use-yes          Review of Systems:  CONSTITUTIONAL: No fevers, chills, night sweats, or weight loss.  *** EYES: No visual changes or eye pain ENT: No hearing changes.  No history of nose bleeds.   RESPIRATORY: No cough, wheezing and shortness of breath.   CARDIOVASCULAR: Negative for chest pain, and palpitations.   GI: Negative for abdominal discomfort, blood in stools or black stools.  No recent change in bowel habits.   GU:  No history of incontinence.   MUSCLOSKELETAL: No history of joint pain or swelling.  No myalgias.   SKIN: Negative for lesions, rash, and itching.   HEMATOLOGY/ONCOLOGY: Negative for prolonged bleeding, bruising easily, and swollen nodes.  No history of cancer.   ENDOCRINE: Negative for cold or heat intolerance, polydipsia or goiter.   PSYCH:  ***depression or anxiety symptoms.   NEURO: As Above.   Vital Signs:  LMP  (LMP Unknown)    General Medical Exam:  *** General:  Well appearing, comfortable.   Eyes/ENT: see cranial nerve examination.   Neck:   No carotid bruits. Respiratory:  Clear to auscultation, good air entry bilaterally.   Cardiac:  Regular rate and rhythm, no murmur.   Extremities:  No deformities, edema, or skin discoloration.  Skin:  No rashes or lesions.  Neurological Exam: MENTAL STATUS including orientation to time, place, person, recent and remote memory, attention span and concentration, language, and fund of knowledge is ***normal.  Speech is not dysarthric.  CRANIAL NERVES: II:  No visual field defects.  Unremarkable fundi.   III-IV-VI: Pupils equal round and reactive to light.  Normal conjugate, extra-ocular eye movements in all directions of gaze.  No nystagmus.  No ptosis***.   V:  Normal facial sensation.    VII:  Normal facial symmetry and movements.   VIII:  Normal hearing and vestibular function.   IX-X:  Normal palatal movement.   XI:  Normal shoulder shrug and head rotation.    XII:  Normal tongue strength and range of motion, no deviation or fasciculation.  MOTOR:  No atrophy, fasciculations or abnormal movements.  No pronator drift.   Right Upper Extremity:    Left Upper Extremity:    Deltoid  5/5   Deltoid  5/5   Biceps  5/5   Biceps  5/5   Triceps  5/5   Triceps  5/5   Wrist extensors  5/5   Wrist extensors  5/5   Wrist flexors  5/5   Wrist flexors  5/5   Finger extensors  5/5   Finger extensors  5/5   Finger flexors  5/5   Finger flexors  5/5   Dorsal interossei  5/5   Dorsal interossei  5/5   Abductor  pollicis  5/5   Abductor pollicis  5/5   Tone (Ashworth scale)  0  Tone (Ashworth scale)  0   Right Lower Extremity:    Left Lower Extremity:    Hip flexors  5/5   Hip flexors  5/5   Hip extensors  5/5   Hip extensors  5/5   Knee flexors  5/5   Knee flexors  5/5   Knee extensors  5/5   Knee extensors  5/5   Dorsiflexors  5/5   Dorsiflexors  5/5   Plantarflexors  5/5   Plantarflexors  5/5   Toe extensors  5/5   Toe extensors  5/5   Toe flexors  5/5   Toe flexors  5/5   Tone (Ashworth scale)  0  Tone (Ashworth scale)  0   MSRs:  Right                                                                 Left brachioradialis 2+  brachioradialis 2+  biceps 2+  biceps 2+  triceps 2+  triceps 2+  patellar 2+  patellar 2+  ankle jerk 2+  ankle jerk 2+  Hoffman no  Hoffman no  plantar response down  plantar response down   SENSORY:  Normal and symmetric perception of light touch, pinprick, vibration, and proprioception.  Romberg's sign absent.   COORDINATION/GAIT: Normal finger-to- nose-finger and heel-to-shin.  Intact rapid alternating movements bilaterally.  Able to rise from a chair without using arms.  Gait narrow based and stable. Tandem and stressed gait intact.    IMPRESSION: ***  PLAN/RECOMMENDATIONS:  *** Return to clinic in *** months.    Thank you for allowing me to participate in patient's care.  If I can answer any additional  questions, I would be pleased to do so.    Sincerely,    Paiten Boies K. Allena Katz, DO

## 2018-10-07 ENCOUNTER — Ambulatory Visit: Payer: Managed Care, Other (non HMO) | Admitting: Neurology

## 2018-10-09 NOTE — Progress Notes (Signed)
[facility] Clinic Note - Initial Visit   Date: 10/12/18  Kellie Shaffer MRN: [id number] DOB: April 02, 1991   Dear Raliegh Ip, FNP:  Thank you for your kind referral of Kellie Shaffer for consultation of Chiari I malformation. Although her history is well known to you, please allow [place] to reiterate it for the purpose of our medical record. The patient was accompanied to the clinic by sister who also provides collateral information.     History of Present Illness: Kellie Shaffer is a 28 y.o. right-handed African American female with diabetes mellitus, hypertension, hyperthyroidism, anxeity, and hyperlipidemia presenting for evaluation of Chiari I malformation.    Starting around 2019, she began having spells of lightheadedness, dizziness, and blurred vision.  She does not have headaches, nausea, vomiting, double vision, tinnitus, or difficulty swallowing/talking.  There is no worsening of symptoms with coughing, sneezing, or bearing down. She has noticed that driving and being around crowds tends to trigger these symptoms.  She has been diagnosed with anxiety and panic attacks.  She takes buspar and is not Manufacturing engineer. She has intermittent sharp pain over the mid-back, which occurs a few times per week, which is alleviated by heat.  Because of these symptoms, she had CT head performed in June 2019 which showed findings consistent with Chiari 1 malformation.  She works as Designer, industrial/product at [employer].  She lives alone, no children.    Out-side paper records, electronic medical record, and images have been reviewed where available and summarized as:  CT head 01/30/2018: 1.  No acute intracranial abnormality. 2. 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 Recommend brain MRI for further evaluation.  Lab Results  Component Value Date   TSH 2.18  09/13/2018   Lab Results  Component Value Date   HGBA1C 7.3 (H) 09/13/2018     Past Medical History:  Diagnosis Date  . Abnormal Pap smear of cervix 2019   LSIL  . Abortion May 2013  . Anemia   . Diabetes mellitus without complication (HCC)   . History of ITP   . History of palpitations    Cardiac event monitor June 2019: Normal.  Sinus rhythm no arrhythmias or significant PACs/PVCs.  Marland Kitchen Hypercholesteremia   . Hypertension   . Hyperthyroidism   . Iron deficiency anemia 09/04/2016    Past Surgical History:  Procedure Laterality Date  . LAPAROSCOPIC APPENDECTOMY N/A 01/14/2017   Procedure: APPENDECTOMY LAPAROSCOPIC;  Surgeon: Claud Kelp, MD;  Location: [facility];  Service: General;  Laterality: N/A;     Medications:  Outpatient Encounter Medications as of 10/12/2018  Medication Sig  . atorvastatin (LIPITOR) 40 MG tablet Take 1 tablet (40 mg total) by mouth daily.  . Biotin 70623 MCG TABS Take 1,000 mcg by mouth daily.   . busPIRone (BUSPAR) 10 MG tablet Take 1 tablet (10 mg total) by mouth daily.  . cetirizine (ZYRTEC) 10 MG tablet Take 1 tablet (10 mg total) by mouth daily as needed for allergies. (Patient taking differently: Take 10 mg by mouth daily. )  . ibuprofen (ADVIL,MOTRIN) 600 MG tablet Take 1 tablet (600 mg total) by mouth every 8 (eight) hours as needed for moderate pain.  . IRON PO Take 1 tablet by mouth daily.   Marland Kitchen lisinopril (PRINIVIL,ZESTRIL) 5 MG tablet Take 1 tablet (5 mg total) by mouth daily.  . LO LOESTRIN FE 1 MG-10 MCG /  10 MCG tablet Take 1 tablet by mouth daily.  . methimazole (TAPAZOLE) 10 MG tablet Take 1 tablet (10 mg total) by mouth daily.  . metoprolol tartrate (LOPRESSOR) 25 MG tablet Take 0.5 tablets (12.5 mg total) by mouth every morning.  . Multiple Vitamins-Minerals (MULTIVITAMIN ADULT PO) Take by mouth daily.  . [EXPIRED] ciprofloxacin (CILOXAN) 0.3 % ophthalmic solution Place 1 drop into the left eye 4 (four) times daily for 7 days.  .  [DISCONTINUED] atorvastatin (LIPITOR) 40 MG tablet TAKE 1 TABLET (40 MG TOTAL) BY MOUTH DAILY.  . [DISCONTINUED] busPIRone (BUSPAR) 10 MG tablet Take 1 tablet (10 mg total) by mouth daily.  . [DISCONTINUED] lisinopril (PRINIVIL,ZESTRIL) 5 MG tablet TAKE 1 TABLET (5 MG TOTAL) BY MOUTH DAILY.  . [DISCONTINUED] metoprolol tartrate (LOPRESSOR) 25 MG tablet TAKE 1/2 TABLET BY MOUTH EVERY MORNING.   No facility-administered encounter medications on file as of 10/12/2018.     Allergies:  Allergies  Allergen Reactions  . Benadryl [Diphenhydramine Hcl] Anaphylaxis and Hives  . Chloroxine Hives    Clorox    Family History: Family History  Problem Relation Age of Onset  . Diabetes Other        Parent  . Hypertension Other        Parent  . Hyperlipidemia Other        Parent  . Arthritis Other        Parent  . Breast cancer Mother     Social History: Social History   Tobacco Use  . Smoking status: Current Every Day Smoker    Packs/day: 0.25    Types: Cigarettes  . Smokeless tobacco: Never Used  Substance Use Topics  . Alcohol use: Yes    Alcohol/week: 1.0 standard drinks    Types: 1 Standard drinks or equivalent per week  . Drug use: No   Social History   Social History Narrative   Regular exercise-yes   Caffeine Use-yes         Review of Systems:  CONSTITUTIONAL: No fevers, chills, night sweats, or weight loss.   EYES: No visual changes or eye pain ENT: No hearing changes.  No history of nose bleeds.   RESPIRATORY: No cough, wheezing and shortness of breath.   CARDIOVASCULAR: Negative for chest pain, and palpitations.   GI: Negative for abdominal discomfort, blood in stools or black stools.  No recent change in bowel habits.   GU:  No history of incontinence.   MUSCLOSKELETAL: No history of joint pain or swelling.  No myalgias.   SKIN: Negative for lesions, rash, and itching.   HEMATOLOGY/ONCOLOGY: Negative for prolonged bleeding, bruising easily, and swollen nodes.   No history of cancer.   ENDOCRINE: Negative for cold or heat intolerance, polydipsia or goiter.   PSYCH:  No depression +anxiety symptoms.   NEURO: As Above.   Vital Signs:  BP 132/80   Pulse 80   Ht 5\' 9"  (1.753 m)   Wt 239 lb (108.4 kg)   LMP  (LMP Unknown)   SpO2 96%   BMI 35.29 kg/m    General Medical Exam:   General:  Well appearing, comfortable.   Eyes/ENT: see cranial nerve examination.   Neck:   No carotid bruits. Respiratory:  Clear to auscultation, good air entry bilaterally.   Cardiac:  Regular rate and rhythm, no murmur.   Extremities:  No deformities, edema, or skin discoloration.  Skin:  No rashes or lesions.  Neurological Exam: MENTAL STATUS including orientation to time, place, person,  recent and remote memory, attention span and concentration, language, and fund of knowledge is normal.  Speech is not dysarthric.  CRANIAL NERVES: II:  No visual field defects.  Unremarkable fundi.   III-IV-VI: Pupils equal round and reactive to light.  Normal conjugate, extra-ocular eye movements in all directions of gaze.  No nystagmus.  No ptosis.   V:  Normal facial sensation.    VII:  Normal facial symmetry and movements.   VIII:  Normal hearing and vestibular function.   IX-X:  Normal palatal movement.   XI:  Normal shoulder shrug and head rotation.   XII:  Normal tongue strength and range of motion, no deviation or fasciculation.  MOTOR:  No atrophy, fasciculations or abnormal movements.  No pronator drift.   Right Upper Extremity:    Left Upper Extremity:    Deltoid  5/5   Deltoid  5/5   Biceps  5/5   Biceps  5/5   Triceps  5/5   Triceps  5/5   Wrist extensors  5/5   Wrist extensors  5/5   Wrist flexors  5/5   Wrist flexors  5/5   Finger extensors  5/5   Finger extensors  5/5   Finger flexors  5/5   Finger flexors  5/5   Dorsal interossei  5/5   Dorsal interossei  5/5   Abductor pollicis  5/5   Abductor pollicis  5/5   Tone (Ashworth scale)  0  Tone (Ashworth  scale)  0   Right Lower Extremity:    Left Lower Extremity:    Hip flexors  5/5   Hip flexors  5/5   Hip extensors  5/5   Hip extensors  5/5   Knee flexors  5/5   Knee flexors  5/5   Knee extensors  5/5   Knee extensors  5/5   Dorsiflexors  5/5   Dorsiflexors  5/5   Plantarflexors  5/5   Plantarflexors  5/5   Toe extensors  5/5   Toe extensors  5/5   Toe flexors  5/5   Toe flexors  5/5   Tone (Ashworth scale)  0  Tone (Ashworth scale)  0   MSRs:  Right                                                                 Left brachioradialis 1+  brachioradialis 1+  biceps 1+  biceps 1+  triceps 1+  triceps 1+  patellar 1+  patellar 1+  ankle jerk 1+  ankle jerk 1+  Hoffman no  Hoffman no  plantar response down  plantar response down   SENSORY:  Normal and symmetric perception of light touch, pinprick, vibration, and proprioception.  Romberg's sign absent.   COORDINATION/GAIT: Normal finger-to- nose-finger and heel-to-shin.  Intact rapid alternating movements bilaterally.  Able to rise from a chair without using arms.  Gait narrow based and stable. Tandem and stressed gait intact.    IMPRESSION: Chiari I malformation as seen on CT head.  To better determine structural anatomy of the posterior fossa, recommend MRI brain without contrast.  It is hard to tell what degree of her symptoms are due to Chiari, as symptoms are triggered by stress.  She does not have any localizing neurological  deficits.  Fortunately, she has no headaches, double vision, or bulbar complains.  She has episodic vertigo and lightheadedness, which self-resolves.  Since her symptoms are transient and mild, I will continue to follow her.  Patient reassured that with her normal neurological exam, these findings many times are incidental.  Return to clinic after testing.   Thank you for allowing me to participate in patient's care.  If I can answer any additional questions, I would be pleased to do so.     Sincerely,    Kepler Mccabe K. Allena Katz, DO

## 2018-10-10 ENCOUNTER — Telehealth: Payer: Self-pay

## 2018-10-10 NOTE — Telephone Encounter (Signed)
Left a vm for patient to callback 

## 2018-10-10 NOTE — Telephone Encounter (Signed)
Left another message for patient to call back 

## 2018-10-11 MED ORDER — LISINOPRIL 5 MG PO TABS: 5.0000 mg | ORAL_TABLET | Freq: Every day | ORAL | 1 refills | Status: DC

## 2018-10-11 MED FILL — ATORVASTATIN CALCIUM 40 MG: 40 | 30 days supply | Qty: 30 | Fill #1

## 2018-10-11 MED FILL — LO LOESTRIN FE 1-10 TABLET: 1 MG-10 MCG | 28 days supply | Qty: 28 | Fill #4

## 2018-10-11 MED FILL — METOPROLOL TARTRATE 25 MG T: 25 | 30 days supply | Qty: 15 | Fill #1

## 2018-10-12 ENCOUNTER — Other Ambulatory Visit: Payer: Self-pay

## 2018-10-12 ENCOUNTER — Encounter: Payer: Self-pay | Admitting: Neurology

## 2018-10-12 ENCOUNTER — Ambulatory Visit (INDEPENDENT_AMBULATORY_CARE_PROVIDER_SITE_OTHER): Payer: BLUE CROSS/BLUE SHIELD | Admitting: Neurology

## 2018-10-12 VITALS — BP 132/80 | HR 80 | Ht 69.0 in | Wt 239.0 lb

## 2018-10-12 DIAGNOSIS — G935 Compression of brain: Secondary | ICD-10-CM

## 2018-10-12 DIAGNOSIS — F419 Anxiety disorder, unspecified: Secondary | ICD-10-CM

## 2018-10-12 DIAGNOSIS — R42 Dizziness and giddiness: Secondary | ICD-10-CM

## 2018-10-12 MED ORDER — METOPROLOL TARTRATE 25 MG PO TABS: 12.5000 mg | ORAL_TABLET | Freq: Every morning | ORAL | 2 refills | Status: DC

## 2018-10-12 MED ORDER — ATORVASTATIN CALCIUM 40 MG PO TABS: 40.0000 mg | ORAL_TABLET | Freq: Every day | ORAL | 3 refills | Status: DC

## 2018-10-12 MED ORDER — BUSPIRONE HCL 10 MG PO TABS: 10.0000 mg | ORAL_TABLET | Freq: Every day | ORAL | 2 refills | Status: DC

## 2018-10-12 MED ORDER — LISINOPRIL 5 MG PO TABS: 5.0000 mg | ORAL_TABLET | Freq: Every day | ORAL | 2 refills | Status: DC

## 2018-10-12 MED FILL — LISINOPRIL 5 MG TAB: 5 | 30 days supply | Qty: 30 | Fill #0

## 2018-10-12 NOTE — Telephone Encounter (Signed)
[facility] patient

## 2018-10-12 NOTE — Patient Instructions (Addendum)
MRI brain without contrast  We will call you with the results  We have sent a referral to [facility] for your MRI and they will call you directly to schedule your appt. They are located at [address] [facility]. If you need to contact them directly please call [phone].

## 2018-10-18 ENCOUNTER — Ambulatory Visit
Admission: RE | Admit: 2018-10-18 | Discharge: 2018-10-18 | Disposition: A | Discharge status: Home / Self Care | Payer: BLUE CROSS/BLUE SHIELD | Source: Ambulatory Visit | Attending: Neurology | Admitting: Neurology

## 2018-10-18 DIAGNOSIS — G935 Compression of brain: Secondary | ICD-10-CM | POA: Diagnosis not present

## 2018-10-19 ENCOUNTER — Telehealth: Payer: Self-pay | Admitting: *Deleted

## 2018-10-19 NOTE — Telephone Encounter (Signed)
Left message giving patient results and instructions.   

## 2018-10-19 NOTE — Telephone Encounter (Signed)
-----   Message from Glendale Chard, DO sent at 10/19/2018  1:05 PM EST ----- Please inform patient that her MRI brain shows similar findings as her CT - there is a Chiari I malformation and there is no compression of nearby structures.  If she develops any new headaches or neurological symptoms, we can readdress, but most of her symptoms are triggered by being in crowds suggesting that anxiety is the bigger factor. Thanks.

## 2018-11-08 ENCOUNTER — Encounter: Payer: Self-pay | Admitting: Family Medicine

## 2018-11-08 ENCOUNTER — Other Ambulatory Visit: Payer: Self-pay

## 2018-11-08 ENCOUNTER — Ambulatory Visit (INDEPENDENT_AMBULATORY_CARE_PROVIDER_SITE_OTHER): Payer: BLUE CROSS/BLUE SHIELD | Admitting: Family Medicine

## 2018-11-08 VITALS — BP 130/88 | HR 68 | Temp 98.0°F | Ht 69.0 in | Wt 237.0 lb

## 2018-11-08 DIAGNOSIS — E119 Type 2 diabetes mellitus without complications: Secondary | ICD-10-CM | POA: Diagnosis not present

## 2018-11-08 DIAGNOSIS — I10 Essential (primary) hypertension: Secondary | ICD-10-CM | POA: Diagnosis not present

## 2018-11-08 DIAGNOSIS — N898 Other specified noninflammatory disorders of vagina: Secondary | ICD-10-CM

## 2018-11-08 DIAGNOSIS — F419 Anxiety disorder, unspecified: Secondary | ICD-10-CM | POA: Diagnosis not present

## 2018-11-08 DIAGNOSIS — Z09 Encounter for follow-up examination after completed treatment for conditions other than malignant neoplasm: Secondary | ICD-10-CM

## 2018-11-08 LAB — POCT URINALYSIS DIP (MANUAL ENTRY)
Bilirubin, UA: NEGATIVE
Blood, UA: NEGATIVE
Glucose, UA: NEGATIVE mg/dL
Ketones, POC UA: NEGATIVE mg/dL
Leukocytes, UA: NEGATIVE
Nitrite, UA: NEGATIVE
Protein Ur, POC: NEGATIVE mg/dL
Spec Grav, UA: 1.02 (ref 1.010–1.025)
Urobilinogen, UA: 0.2 E.U./dL
pH, UA: 7 (ref 5.0–8.0)

## 2018-11-08 LAB — GLUCOSE, POCT (MANUAL RESULT ENTRY): POC Glucose: 110 mg/dl — AB (ref 70–99)

## 2018-11-08 NOTE — Progress Notes (Signed)
Patient Kellie Shaffer  Sick Visit  Subjective:  Patient ID: Kellie Shaffer, female    DOB: 06/22/1991  Age: 28 y.o. MRN: [id number]  CC:  Chief Complaint  Patient presents with  . Vaginal Discharge  . Vaginal Itching  . Vaginal odor    HPI [name] is a 28 year old female who presents for Sick Visit today.   Past Medical History:  Diagnosis Date  . Abnormal Pap smear of cervix 2019   LSIL  . Abortion May 2013  . Anemia   . Diabetes mellitus without complication ([place])   . History of ITP   . History of palpitations    Cardiac event monitor June 2019: Normal.  Sinus rhythm no arrhythmias or significant PACs/PVCs.  Marland Kitchen Hypercholesteremia   . Hypertension   . Hyperthyroidism   . Iron deficiency anemia 09/04/2016   Current Status: Since her last office visit, she is doing well with no complaints. She has been having vaginal discharge, itching, and odor X 4 days now. She has not used anything for relief. She denies urinary frequency, discharge, dysuria, urinary itching, burning, odor, hematuria, and suprapubic pain/discomfort. She denies visual changes, chest pain, cough, shortness of breath, heart palpitations, and falls. She has occasional headaches and dizziness with position changes. Denies severe headaches, confusion, seizures, double vision, and blurred vision, nausea and vomiting. Her normal range of preprandial blood glucose levels are between 103-139. She denies fatigue, frequent urination, blurred vision, excessive hunger, excessive thirst, weight gain, weight loss, and poor wound healing. She denies fatigue, inability to tolerate cold, unexplained weight gain, dry skin, coarse hair, decrease thought process, constipation, depression.   Her anxiety is mild today. She denies suicidal ideations, homicidal ideations, or auditory hallucinations. She is requesting forms to be completed concerning 'work at home' options offered by  her job, based on her autoimmune diagnosis.   She denies fevers, chills, fatigue, recent infections, weight loss, and night sweats. She has not had any headaches, visual changes, dizziness, and falls. No chest pain, heart palpitations, cough and shortness of breath reported. No reports of GI problems such as nausea, vomiting, diarrhea, and constipation. She has no reports of blood in stools, dysuria and hematuria. She denies pain today.   Past Surgical History:  Procedure Laterality Date  . LAPAROSCOPIC APPENDECTOMY N/A 01/14/2017   Procedure: APPENDECTOMY LAPAROSCOPIC;  Surgeon: Fanny Skates, MD;  Location: [facility];  Service: General;  Laterality: N/A;    Family History  Problem Relation Age of Onset  . Diabetes Other        Parent  . Hypertension Other        Parent  . Hyperlipidemia Other        Parent  . Arthritis Other        Parent  . Breast cancer Mother     Social History   Socioeconomic History  . Marital status: Single    Spouse name: Not on file  . Number of children: Not on file  . Years of education: 90  . Highest education level: Not on file  Occupational History  . 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 Financial resource strain: Not on file  . Food insecurity:    Worry: Not on file    Inability: Not on file  . Transportation needs:    Medical: Not on file    Non-medical: Not on file  Tobacco Use  . Smoking status: Current Every Day  Smoker    Packs/day: 0.25    Types: Cigarettes  . Smokeless tobacco: Never Used  Substance and Sexual Activity  . Alcohol use: Yes    Alcohol/week: 1.0 standard drinks    Types: 1 Standard drinks or equivalent per week  . Drug use: No  . Sexual activity: Yes    Birth control/protection: Condom  Lifestyle  . Physical activity:    Days per week: Not on file    Minutes per session: Not on file  . Stress: Not on file  Relationships  . Social connections:    Talks on phone: Not on file    Gets together: Not on  file    Attends religious service: Not on file    Active member of club or organization: Not on file    Attends meetings of clubs or organizations: Not on file    Relationship status: Not on file  . Intimate partner violence:    Fear of current or ex partner: Not on file    Emotionally abused: Not on file    Physically abused: Not on file    Forced sexual activity: Not on file  Other Topics Concern  . Not on file  Social History Narrative   Regular exercise-yes   Caffeine Use-yes          Outpatient Medications Prior to Visit  Medication Sig Dispense Refill  . atorvastatin (LIPITOR) 40 MG tablet Take 1 tablet (40 mg total) by mouth daily. 90 tablet 3  . Biotin 10000 MCG TABS Take 1,000 mcg by mouth daily.     . busPIRone (BUSPAR) 10 MG tablet Take 1 tablet (10 mg total) by mouth daily. 90 tablet 2  . cetirizine (ZYRTEC) 10 MG tablet Take 1 tablet (10 mg total) by mouth daily as needed for allergies. (Patient taking differently: Take 10 mg by mouth daily. ) 90 tablet 1  . ibuprofen (ADVIL,MOTRIN) 600 MG tablet Take 1 tablet (600 mg total) by mouth every 8 (eight) hours as needed for moderate pain. 30 tablet 0  . IRON PO Take 1 tablet by mouth daily.     Marland Kitchen lisinopril (PRINIVIL,ZESTRIL) 5 MG tablet Take 1 tablet (5 mg total) by mouth daily. 90 tablet 2  . LO LOESTRIN FE 1 MG-10 MCG / 10 MCG tablet Take 1 tablet by mouth daily.  3  . methimazole (TAPAZOLE) 10 MG tablet Take 1 tablet (10 mg total) by mouth daily. 90 tablet 3  . metoprolol tartrate (LOPRESSOR) 25 MG tablet Take 0.5 tablets (12.5 mg total) by mouth every morning. 90 tablet 2  . Multiple Vitamins-Minerals (MULTIVITAMIN ADULT PO) Take by mouth daily.     No facility-administered medications prior to visit.     Allergies  Allergen Reactions  . Benadryl [Diphenhydramine Hcl] Anaphylaxis and Hives  . Chloroxine Hives    Clorox    ROS Review of Systems  Constitutional: Negative.   HENT: Negative.   Eyes: Negative.    Respiratory: Negative.   Cardiovascular: Negative.   Gastrointestinal: Negative.   Endocrine: Negative.   Genitourinary: Negative.   Musculoskeletal: Negative.   Skin: Negative.   Allergic/Immunologic: Negative.   Neurological: Positive for dizziness and headaches.  Hematological: Negative.   Psychiatric/Behavioral: Negative.    Objective:    Physical Exam  Constitutional: She is oriented to person, place, and time. She appears well-developed and well-nourished.  HENT:  Head: Normocephalic and atraumatic.  Eyes: Conjunctivae are normal.  Neck: Normal range of motion. Neck supple.  Cardiovascular:  Normal rate, regular rhythm, normal heart sounds and intact distal pulses.  Pulmonary/Chest: Effort normal and breath sounds normal.  Abdominal: Soft. Bowel sounds are normal.  Musculoskeletal: Normal range of motion.  Neurological: She is alert and oriented to person, place, and time. She has normal reflexes.  Skin: Skin is warm and dry.  Psychiatric: She has a normal mood and affect. Her behavior is normal. Judgment and thought content normal.  Nursing note and vitals reviewed.   BP 130/88 (BP Location: Left Arm, Patient Position: Sitting, Cuff Size: Small)   Pulse 68   Temp 98 F (36.7 C) (Oral)   Ht _0  (1.753 m)   Wt 237 lb (107.5 kg)   LMP 10/10/2018   SpO2 100%   BMI 35.00 kg/m  Wt Readings from Last 3 Encounters:  11/08/18 237 lb (107.5 kg)  10/12/18 239 lb (108.4 kg)  10/04/18 239 lb (108.4 kg)     Health Maintenance Due  Topic Date Due  . PNEUMOCOCCAL POLYSACCHARIDE VACCINE AGE 15-64 HIGH RISK  03/10/1992  . OPHTHALMOLOGY EXAM  03/10/2000  . FOOT EXAM  09/10/2018    There are no preventive Shaffer reminders to display for this patient.  Lab Results  Component Value Date   TSH 2.18 09/13/2018   Lab Results  Component Value Date   WBC 6.4 05/26/2018   HGB 11.4 (L) 05/26/2018   HCT 35.9 (L) 05/26/2018   MCV 83.5 05/26/2018   PLT 172 05/26/2018   Lab  Results  Component Value Date   NA 140 05/26/2018   K 3.7 05/26/2018   CHLORIDE 95 (L) 08/13/2017   CO2 22 05/26/2018   GLUCOSE 114 (H) 05/26/2018   BUN 11 05/26/2018   CREATININE 0.76 05/26/2018   BILITOT 0.4 05/26/2018   ALKPHOS 73 05/26/2018   AST 19 05/26/2018   ALT 37 05/26/2018   PROT 7.7 05/26/2018   ALBUMIN 3.9 05/26/2018   CALCIUM 9.4 05/26/2018   ANIONGAP 10 05/26/2018   EGFR >60 08/13/2017   Lab Results  Component Value Date   CHOL 216 (H) 08/31/2017   Lab Results  Component Value Date   HDL 35 (L) 08/31/2017   Lab Results  Component Value Date   LDLCALC 168 (H) 08/31/2017   Lab Results  Component Value Date   TRIG 67 08/31/2017   Lab Results  Component Value Date   CHOLHDL 6.2 08/31/2017   Lab Results  Component Value Date   HGBA1C 7.3 (H) 09/13/2018   Assessment & Plan:   1. Type 2 diabetes mellitus without complication, without long-term current use of insulin (HCC) Blood glucose levels are stable today. She will continue to decrease foods/beverages high in sugars and carbs and follow Heart Healthy or DASH diet. Increase physical activity to at least 30 minutes cardio exercise daily.  - POCT urinalysis dipstick - POCT glucose (manual entry)  2. Hypertension, unspecified type Blood pressure is stable at 130/88 today. Continue Lisinopril and Metoprolol as prescribed. She will continue to decrease high sodium intake, excessive alcohol intake, increase potassium intake, smoking cessation, and increase physical activity of at least 30 minutes of cardio activity daily. She will continue to follow Heart Healthy or DASH diet.  3. Anxiety Stable today.   4. Vaginal discharge Results are pending.  - NuSwab Vaginitis Plus (VG+)  5. Follow up She will keep previously scheduled follow up appointment.  She will bring necessary forms to office, to work at home because of her history of ITP. No orders of  the defined types were placed in this encounter.    Referral Orders  No referral(s) requested today    Kathe Becton,  MSN, FNP-C Patient [address] [place], [place] [address] [phone]  Problem List Items Addressed This Visit    None    Visit Diagnoses    Type 2 diabetes mellitus without complication, without long-term current use of insulin ([place])    -  Primary   Relevant Orders   POCT urinalysis dipstick (Completed)   POCT glucose (manual entry) (Completed)   Hypertension, unspecified type       Anxiety       Vaginal discharge       Relevant Orders   NuSwab Vaginitis Plus (VG+)   Follow up          No orders of the defined types were placed in this encounter.   Follow-up: No follow-ups on file.    Azzie Glatter, FNP

## 2018-11-09 DIAGNOSIS — E119 Type 2 diabetes mellitus without complications: Secondary | ICD-10-CM | POA: Insufficient documentation

## 2018-11-09 DIAGNOSIS — F419 Anxiety disorder, unspecified: Secondary | ICD-10-CM | POA: Insufficient documentation

## 2018-11-09 DIAGNOSIS — I10 Essential (primary) hypertension: Secondary | ICD-10-CM | POA: Insufficient documentation

## 2018-11-10 ENCOUNTER — Other Ambulatory Visit: Payer: Self-pay | Admitting: Family Medicine

## 2018-11-10 ENCOUNTER — Telehealth: Payer: Self-pay

## 2018-11-10 NOTE — Telephone Encounter (Signed)
Patient needs a letter stating that she needs to work from home due to COV-19 and she has chronic medical conditions. Patient is aware that you are out of office

## 2018-11-11 NOTE — Telephone Encounter (Signed)
Patient notified

## 2018-11-11 NOTE — Telephone Encounter (Signed)
Left a vm that letter is ready for pick up

## 2018-11-12 ENCOUNTER — Other Ambulatory Visit: Payer: Self-pay

## 2018-11-12 ENCOUNTER — Emergency Department (HOSPITAL_BASED_OUTPATIENT_CLINIC_OR_DEPARTMENT_OTHER)
Admission: EM | Admit: 2018-11-12 | Discharge: 2018-11-12 | Disposition: A | Discharge status: Home / Self Care | Payer: BLUE CROSS/BLUE SHIELD | Attending: Emergency Medicine | Admitting: Emergency Medicine

## 2018-11-12 ENCOUNTER — Encounter (HOSPITAL_BASED_OUTPATIENT_CLINIC_OR_DEPARTMENT_OTHER): Payer: Self-pay | Admitting: Emergency Medicine

## 2018-11-12 DIAGNOSIS — H81399 Other peripheral vertigo, unspecified ear: Secondary | ICD-10-CM | POA: Diagnosis not present

## 2018-11-12 DIAGNOSIS — E119 Type 2 diabetes mellitus without complications: Secondary | ICD-10-CM | POA: Insufficient documentation

## 2018-11-12 DIAGNOSIS — I10 Essential (primary) hypertension: Secondary | ICD-10-CM | POA: Diagnosis not present

## 2018-11-12 DIAGNOSIS — M62830 Muscle spasm of back: Secondary | ICD-10-CM | POA: Diagnosis not present

## 2018-11-12 DIAGNOSIS — F17210 Nicotine dependence, cigarettes, uncomplicated: Secondary | ICD-10-CM | POA: Diagnosis not present

## 2018-11-12 DIAGNOSIS — R42 Dizziness and giddiness: Secondary | ICD-10-CM | POA: Diagnosis not present

## 2018-11-12 MED ORDER — MECLIZINE HCL 25 MG PO TABS: 25.0000 mg | ORAL_TABLET | Freq: Three times a day (TID) | ORAL | 0 refills | Status: DC | PRN

## 2018-11-12 NOTE — ED Provider Notes (Signed)
[facility] Emergency Department Provider Note MRN:  [id number]  Arrival date & time: 11/12/18     Chief Complaint   Dizziness   History of Present Illness   Kellie Shaffer is a 28 y.o. year-old female with a history of diabetes, ITP presenting to the ED with chief complaint of dizziness.  Patient explains that she was sitting on the couch, had just sat down with her popcorn, turned her head to reach for a blanket when she suddenly experienced that the room was spinning, could not sit straight up, fell toward her left side.  Symptom of room spinning would not go away, had to carefully walk/crawl to the bathroom to find the trash can, felt like she was going to vomit.  Symptoms lasted 45 minutes and then resolved.  Explains that this is happened in the past but only lasts 5 to 10 minutes usually.  Has been told that this is a symptom of her anxiety.  Patient denies any chest pain or shortness of breath, no headache, no vision change, no numbness weakness to the arms or legs, no recent fever or cold-like symptoms.  Patient also endorsing intermittent back pain or spasms that occur randomly throughout the day, usually at work.  Denies drug use, no bowel or bladder dysfunction.  Review of Systems  A complete 10 system review of systems was obtained and all systems are negative except as noted in the HPI and PMH.   Patient's Health History    Past Medical History:  Diagnosis Date  . Abnormal Pap smear of cervix 2019   LSIL  . Abortion May 2013  . Anemia   . Diabetes mellitus without complication (HCC)   . History of ITP   . History of palpitations    Cardiac event monitor June 2019: Normal.  Sinus rhythm no arrhythmias or significant PACs/PVCs.  Marland Kitchen Hypercholesteremia   . Hypertension   . Hyperthyroidism   . Iron deficiency anemia 09/04/2016    Past Surgical History:  Procedure Laterality Date  . LAPAROSCOPIC APPENDECTOMY N/A 01/14/2017   Procedure:  APPENDECTOMY LAPAROSCOPIC;  Surgeon: Claud Kelp, MD;  Location: [facility];  Service: General;  Laterality: N/A;    Family History  Problem Relation Age of Onset  . Diabetes Other        Parent  . Hypertension Other        Parent  . Hyperlipidemia Other        Parent  . Arthritis Other        Parent  . Breast cancer Mother     Social History   Socioeconomic History  . Marital status: Single    Spouse name: Not on file  . Number of children: Not on file  . Years of education: 67  . Highest education level: Not on file  Occupational History  . 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 Financial resource strain: Not on file  . Food insecurity:    Worry: Not on file    Inability: Not on file  . Transportation needs:    Medical: Not on file    Non-medical: Not on file  Tobacco Use  . Smoking status: Current Every Day Smoker    Packs/day: 0.25    Types: Cigarettes  . Smokeless tobacco: Never Used  Substance and Sexual Activity  . Alcohol use: Yes    Alcohol/week: 1.0 standard drinks    Types: 1 Standard drinks or equivalent per week  . Drug use: No  .  Sexual activity: Yes    Birth control/protection: Condom  Lifestyle  . Physical activity:    Days per week: Not on file    Minutes per session: Not on file  . Stress: Not on file  Relationships  . Social connections:    Talks on phone: Not on file    Gets together: Not on file    Attends religious service: Not on file    Active member of club or organization: Not on file    Attends meetings of clubs or organizations: Not on file    Relationship status: Not on file  . Intimate partner violence:    Fear of current or ex partner: Not on file    Emotionally abused: Not on file    Physically abused: Not on file    Forced sexual activity: Not on file  Other Topics Concern  . Not on file  Social History Narrative   Regular exercise-yes   Caffeine Use-yes           Physical Exam  Vital Signs and Nursing Notes  reviewed Vitals:   11/12/18 1755  BP: (!) 171/89  Pulse: 89  Resp: 18  Temp: 98 F (36.7 C)  SpO2: 98%    CONSTITUTIONAL: Well-appearing, NAD NEURO:  Alert and oriented x 3, normal and symmetric strength and sensation, normal coordination with finger-nose-finger testing, normal gait, no nystagmus, normal extraocular movements, normal speech EYES:  eyes equal and reactive ENT/NECK:  no LAD, no JVD CARDIO: Regular rate, well-perfused, normal S1 and S2 PULM:  CTAB no wheezing or rhonchi GI/GU:  normal bowel sounds, non-distended, non-tender MSK/SPINE:  No gross deformities, no edema SKIN:  no rash, atraumatic PSYCH:  Appropriate speech and behavior  Diagnostic and Interventional Summary    Labs Reviewed - No data to display  No orders to display    Medications - No data to display   Procedures Critical Care  ED Course and Medical Decision Making  I have reviewed the triage vital signs and the nursing notes.  Pertinent labs & imaging results that were available during my care of the patient were reviewed by me and considered in my medical decision making (see below for details).  Consistent with peripheral vertigo given the normal neurological exam and the common trigger of head position change.  Patient does have a couple of comorbidities, namely diabetes and ITP.  However her platelets last checked a few months ago were within normal range and she exhibits no signs of easy bruising or bleeding today.  Currently with no indication for CNS imaging.  Patient also endorsing back pain consistent with spasms related to her sedentary nature while at work during the day.  No bowel or bladder dysfunction, no neurologic deficits, nothing to suggest myelopathy.  Provided with reassurance, prescription for meclizine, information about vertigo and the Epley maneuver.  After the discussed management above, the patient was determined to be safe for discharge.  The patient was in agreement with  this plan and all questions regarding their care were answered.  ED return precautions were discussed and the patient will return to the ED with any significant worsening of condition.  Elmer Sow. Pilar Plate, MD [facility] Emergency Medicine [facility] [email or web address]  Final Clinical Impressions(s) / ED Diagnoses     ICD-10-CM   1. Peripheral vertigo, unspecified laterality H81.399   2. Back muscle spasm M62.830     ED Discharge Orders         Ordered  meclizine (ANTIVERT) 25 MG tablet  3 times daily PRN     11/12/18 1816             Sabas Sous, MD 11/12/18 [phone]

## 2018-11-12 NOTE — Discharge Instructions (Addendum)
You were evaluated in the Emergency Department and after careful evaluation, we did not find any emergent condition requiring admission or further testing in the hospital.  Your symptoms today seem to be due to peripheral vertigo.  You can use the medication provided as needed for any continued symptoms.  You could also try the Epley maneuver at home.  Please return to the Emergency Department if you experience any worsening of your condition.  We encourage you to follow up with a primary care provider.  Thank you for allowing [place] to be a part of your care.

## 2018-11-12 NOTE — ED Triage Notes (Signed)
Pt reports she felt like the room was spinning an hour ago while watching a movie. It lasted 45 min. Reports feeling anxious now. Denies pain.

## 2018-11-15 LAB — NUSWAB VAGINITIS PLUS (VG+)
Atopobium vaginae: HIGH Score — AB
BVAB 2: HIGH Score — AB
Candida albicans, NAA: NEGATIVE
Candida glabrata, NAA: NEGATIVE
Chlamydia trachomatis, NAA: NEGATIVE
Megasphaera 1: HIGH Score — AB
Neisseria gonorrhoeae, NAA: NEGATIVE
Trich vag by NAA: NEGATIVE

## 2018-11-16 ENCOUNTER — Other Ambulatory Visit: Payer: Self-pay | Admitting: Family Medicine

## 2018-11-16 ENCOUNTER — Telehealth: Payer: Self-pay

## 2018-11-16 DIAGNOSIS — N760 Acute vaginitis: Principal | ICD-10-CM

## 2018-11-16 DIAGNOSIS — B9689 Other specified bacterial agents as the cause of diseases classified elsewhere: Secondary | ICD-10-CM

## 2018-11-16 MED ORDER — METRONIDAZOLE 500 MG PO TABS: 500.0000 mg | ORAL_TABLET | Freq: Two times a day (BID) | ORAL | 0 refills | Status: AC

## 2018-11-16 NOTE — Telephone Encounter (Signed)
    Patient calling for lab results 

## 2018-11-17 ENCOUNTER — Other Ambulatory Visit: Payer: Self-pay

## 2018-11-17 NOTE — Telephone Encounter (Signed)
Patient needs to have a visit regarding her contraception.  She is on a combined contraceptive pill and is treated for HTN.  She may need to make a switch to another method.

## 2018-11-17 NOTE — Telephone Encounter (Signed)
Medication refill request: Lo Loestrin Fe Last OV:  07/28/18 LEEP f/u Next AEX: 01/12/19 Last MMG (if hormonal medication request): n/a Refill authorized: Please advise, patient needs prescription sent to mail order pharmacy. Order pended for #90 w/0 refills if authorized

## 2018-11-18 NOTE — Telephone Encounter (Signed)
Chart reviewed, discussed with Dr Hyacinth Meeker. Don't recommend she continue on Lo Loestrin. She has multiple medical issues including HTN. She is on 2 antihypertensive meds. Still with some mildly elevated BP's off and on. She can go on micronor, use barrier methods, consider an IUD. If she wants micronor, call in 3 months.

## 2018-11-18 NOTE — Telephone Encounter (Signed)
Spoke with patient. Patient states that she has been on Lisinopril and Metprolol for years and Dr.Miller is aware of this. States she and Dr.Miller decided on Lo Loestrin and Dr.Miller told her to let her know if it changed her BP. Patient states that her BP has been normal.

## 2018-11-18 NOTE — Telephone Encounter (Signed)
Call to patient. Per DPR, can leave message on voice mail.  Left message to call back on Monday regarding prescription refill.

## 2018-11-18 NOTE — Telephone Encounter (Signed)
On review of the records in the patient's first visit with Dr Hyacinth Meeker, she advised against OCP's and recommended POP's. At a f/u visit the patient was on Lo Loestrin, but not prescribed by Dr Hyacinth Meeker. Dr Hyacinth Meeker was aware, no comment on coming off this pill. The patient has a h/o HTN, on review of her BP's over the last few months, some are normal and some are mildly elevated. Can you please check with Dr Hyacinth Meeker as to what she wants to do.

## 2018-11-24 ENCOUNTER — Encounter: Payer: Self-pay | Admitting: Internal Medicine

## 2018-11-24 ENCOUNTER — Other Ambulatory Visit: Payer: Self-pay

## 2018-11-24 ENCOUNTER — Inpatient Hospital Stay: Payer: BLUE CROSS/BLUE SHIELD | Attending: Internal Medicine

## 2018-11-24 ENCOUNTER — Inpatient Hospital Stay (HOSPITAL_BASED_OUTPATIENT_CLINIC_OR_DEPARTMENT_OTHER): Payer: BLUE CROSS/BLUE SHIELD | Admitting: Internal Medicine

## 2018-11-24 VITALS — BP 139/83 | HR 75 | Temp 97.5°F | Resp 18 | Ht 69.0 in | Wt 233.5 lb

## 2018-11-24 DIAGNOSIS — I10 Essential (primary) hypertension: Secondary | ICD-10-CM

## 2018-11-24 DIAGNOSIS — D696 Thrombocytopenia, unspecified: Secondary | ICD-10-CM

## 2018-11-24 DIAGNOSIS — E0590 Thyrotoxicosis, unspecified without thyrotoxic crisis or storm: Secondary | ICD-10-CM

## 2018-11-24 DIAGNOSIS — D693 Immune thrombocytopenic purpura: Secondary | ICD-10-CM | POA: Insufficient documentation

## 2018-11-24 DIAGNOSIS — F17210 Nicotine dependence, cigarettes, uncomplicated: Secondary | ICD-10-CM | POA: Diagnosis not present

## 2018-11-24 LAB — CBC WITH DIFFERENTIAL (CANCER CENTER ONLY)
Abs Immature Granulocytes: 0.03 10*3/uL (ref 0.00–0.07)
Basophils Absolute: 0 10*3/uL (ref 0.0–0.1)
Basophils Relative: 0 %
Eosinophils Absolute: 0.2 10*3/uL (ref 0.0–0.5)
Eosinophils Relative: 2 %
HCT: 40 % (ref 36.0–46.0)
Hemoglobin: 12.6 g/dL (ref 12.0–15.0)
Immature Granulocytes: 0 %
Lymphocytes Relative: 28 %
Lymphs Abs: 2.3 10*3/uL (ref 0.7–4.0)
MCH: 27.1 pg (ref 26.0–34.0)
MCHC: 31.5 g/dL (ref 30.0–36.0)
MCV: 86 fL (ref 80.0–100.0)
Monocytes Absolute: 0.9 10*3/uL (ref 0.1–1.0)
Monocytes Relative: 10 %
Neutro Abs: 4.8 10*3/uL (ref 1.7–7.7)
Neutrophils Relative %: 60 %
Platelet Count: 143 10*3/uL — ABNORMAL LOW (ref 150–400)
RBC: 4.65 MIL/uL (ref 3.87–5.11)
RDW: 15.1 % (ref 11.5–15.5)
WBC Count: 8.2 10*3/uL (ref 4.0–10.5)
nRBC: 0 % (ref 0.0–0.2)

## 2018-11-24 LAB — CMP (CANCER CENTER ONLY)
ALT: 79 U/L — ABNORMAL HIGH (ref 0–44)
AST: 28 U/L (ref 15–41)
Albumin: 4 g/dL (ref 3.5–5.0)
Alkaline Phosphatase: 67 U/L (ref 38–126)
Anion gap: 9 (ref 5–15)
BUN: 14 mg/dL (ref 6–20)
CO2: 23 mmol/L (ref 22–32)
Calcium: 9.4 mg/dL (ref 8.9–10.3)
Chloride: 106 mmol/L (ref 98–111)
Creatinine: 0.76 mg/dL (ref 0.44–1.00)
GFR, Est AFR Am: >60 mL/min (ref >60)
GFR, Estimated: >60 mL/min (ref >60)
Glucose, Bld: 126 mg/dL — ABNORMAL HIGH (ref 70–99)
Potassium: 3.6 mmol/L (ref 3.5–5.1)
Sodium: 138 mmol/L (ref 135–145)
Total Bilirubin: 0.5 mg/dL (ref 0.3–1.2)
Total Protein: 8.2 g/dL — ABNORMAL HIGH (ref 6.5–8.1)

## 2018-11-24 LAB — LACTATE DEHYDROGENASE: LDH: 184 U/L (ref 98–192)

## 2018-11-24 NOTE — Progress Notes (Signed)
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  CURRENT THERAPY: Observation.  INTERVAL HISTORY: Kellie Shaffer 28 y.o. female returns to the clinic today for 6 months follow-up visit.  The patient is feeling fine today with no concerning complaints.  Unfortunately she continues to smoke regularly.  She denied having any bleeding, bruises or ecchymosis.  She has no nausea, vomiting, diarrhea or constipation.  She denied having any headache or visual changes.  She is recently treated for vertigo with Antivert.  She is here today for evaluation and repeat blood work.  MEDICAL HISTORY: Past Medical History:  Diagnosis Date  . Abnormal Pap smear of cervix 2019   LSIL  . Abortion May 2013  . Anemia   . Diabetes mellitus without complication (HCC)   . History of ITP   . History of palpitations    Cardiac event monitor June 2019: Normal.  Sinus rhythm no arrhythmias or significant PACs/PVCs.  Marland Kitchen Hypercholesteremia   . Hypertension   . Hyperthyroidism   . Iron deficiency anemia 09/04/2016    ALLERGIES:  is allergic to benadryl [diphenhydramine hcl] and chloroxine.  MEDICATIONS:  Current Outpatient Medications  Medication Sig Dispense Refill  . atorvastatin (LIPITOR) 40 MG tablet Take 1 tablet (40 mg total) by mouth daily. 90 tablet 3  . Biotin 51761 MCG TABS Take 1,000 mcg by mouth daily.     . busPIRone (BUSPAR) 10 MG tablet Take 1 tablet (10 mg total) by mouth daily. 90 tablet 2  . IRON PO Take 1 tablet by mouth daily.     Marland Kitchen lisinopril (PRINIVIL,ZESTRIL) 5 MG tablet Take 1 tablet (5 mg total) by mouth daily. 90 tablet 2  . LO LOESTRIN FE 1 MG-10 MCG / 10 MCG tablet Take 1 tablet by mouth daily.  3  . meclizine  (ANTIVERT) 25 MG tablet Take 1 tablet (25 mg total) by mouth 3 (three) times daily as needed for dizziness. 30 tablet 0  . metoprolol tartrate (LOPRESSOR) 25 MG tablet Take 0.5 tablets (12.5 mg total) by mouth every morning. 90 tablet 2  . Multiple Vitamins-Minerals (MULTIVITAMIN ADULT PO) Take by mouth daily.    . cetirizine (ZYRTEC) 10 MG tablet Take 1 tablet (10 mg total) by mouth daily as needed for allergies. (Patient not taking: Reported on 11/24/2018) 90 tablet 1  . ibuprofen (ADVIL,MOTRIN) 600 MG tablet Take 1 tablet (600 mg total) by mouth every 8 (eight) hours as needed for moderate pain. (Patient not taking: Reported on 11/24/2018) 30 tablet 0  . methimazole (TAPAZOLE) 10 MG tablet Take 1 tablet (10 mg total) by mouth daily. (Patient not taking: Reported on 11/24/2018) 90 tablet 3   No current facility-administered medications for this visit.     SURGICAL HISTORY:  Past Surgical History:  Procedure Laterality Date  . LAPAROSCOPIC APPENDECTOMY N/A 01/14/2017   Procedure: APPENDECTOMY LAPAROSCOPIC;  Surgeon: Claud Kelp, MD;  Location: [facility];  Service: General;  Laterality: N/A;    REVIEW OF SYSTEMS:  A comprehensive review of systems was negative except for: Neurological: positive for vertigo   PHYSICAL EXAMINATION: General appearance: alert, cooperative, appears stated age and no distress Head: Normocephalic, without obvious abnormality, atraumatic Neck: no adenopathy, no JVD, supple, symmetrical, trachea midline  and thyroid not enlarged, symmetric, no tenderness/mass/nodules Lymph nodes: Cervical, supraclavicular, and axillary nodes normal. Resp: clear to auscultation bilaterally Back: symmetric, no curvature. ROM normal. No CVA tenderness. Cardio: regular rate and rhythm, S1, S2 normal, no murmur, click, rub or gallop GI: soft, non-tender; bowel sounds normal; no masses,  no organomegaly Extremities: extremities normal, atraumatic, no cyanosis or edema  ECOG PERFORMANCE  STATUS: 0 - Asymptomatic  Blood pressure 139/83, pulse 75, temperature (!) 97.5 F (36.4 C), temperature source Oral, resp. rate 18, height 5\' 9"  (1.753 m), weight 233 lb 8 oz (105.9 kg), SpO2 100 %.  LABORATORY DATA: Lab Results  Component Value Date   WBC 8.2 11/24/2018   HGB 12.6 11/24/2018   HCT 40.0 11/24/2018   MCV 86.0 11/24/2018   PLT 143 (L) 11/24/2018      Chemistry      Component Value Date/Time   NA 140 05/26/2018 0908   NA 139 01/12/2018 1522   NA 132 (L) 08/13/2017 1044   K 3.7 05/26/2018 0908   K 4.3 08/13/2017 1044   CL 108 05/26/2018 0908   CO2 22 05/26/2018 0908   CO2 25 08/13/2017 1044   BUN 11 05/26/2018 0908   BUN 9 01/12/2018 1522   BUN 18.6 08/13/2017 1044   CREATININE 0.76 05/26/2018 0908   CREATININE 1.3 (H) 08/13/2017 1044   GLU 517 (H) 08/13/2017 1257      Component Value Date/Time   CALCIUM 9.4 05/26/2018 0908   CALCIUM 9.9 08/13/2017 1044   ALKPHOS 73 05/26/2018 0908   ALKPHOS 100 08/13/2017 1044   AST 19 05/26/2018 0908   AST 5 08/13/2017 1044   ALT 37 05/26/2018 0908   ALT 15 08/13/2017 1044   BILITOT 0.4 05/26/2018 0908   BILITOT 0.52 08/13/2017 1044       RADIOGRAPHIC STUDIES: No results found.  ASSESSMENT AND PLAN: This is a very pleasant [age]years old African-American female with: 1) idiopathic thrombocytopenic purpura: She was treated a few months ago with a taper dose of prednisone because of the significant thrombocytopenia.  The patient is currently on observation and she is feeling fine. Repeat CBC today showed platelets count of 143,000.  The patient is currently asymptomatic. 2) hyperthyroidism: She will continue her routine follow-up visit by her endocrinologist. 3) for smoking cessation I strongly encouraged the patient to quit smoking. I recommended for the patient to continue on observation with repeat CBC, comprehensive metabolic panel and LDH in 6 months. She was advised to call immediately if she has any  concerning symptoms in the interval. The patient voices understanding of current disease status and treatment options and is in agreement with the current care plan.  All questions were answered. The patient knows to call the clinic with any problems, questions or concerns. We can certainly see the patient much sooner if necessary.  Disclaimer: This note was dictated with voice recognition software. Similar sounding words can inadvertently be transcribed and may not be corrected upon review.

## 2018-11-25 ENCOUNTER — Telehealth: Payer: Self-pay | Admitting: Internal Medicine

## 2018-11-25 NOTE — Telephone Encounter (Signed)
Tried to reach regarding 10/12

## 2018-11-29 ENCOUNTER — Other Ambulatory Visit: Payer: Self-pay

## 2018-11-29 ENCOUNTER — Telehealth: Payer: Self-pay | Admitting: *Deleted

## 2018-11-29 ENCOUNTER — Ambulatory Visit (INDEPENDENT_AMBULATORY_CARE_PROVIDER_SITE_OTHER): Payer: Managed Care, Other (non HMO) | Admitting: Obstetrics and Gynecology

## 2018-11-29 NOTE — Telephone Encounter (Signed)
Dr Oscar La online for [facility] visit. Patient not available. Call to patient, no answer.  Left message to call back to reschedule Webex appointment. See Dr Salli Quarry office visit.     Encounter closed.

## 2018-11-29 NOTE — Telephone Encounter (Signed)
Call back to patient. Reviewed with patient need for alternative options for contraception as reviewed by Dr Hyacinth Meeker and Dr Oscar La. Patient out of Lo Loestrin and requests Webex to discuss alternatives.  Appointment scheduled for this afternoon at 330 with Dr Oscar La  ( Dr Hyacinth Meeker out of office.)   Encounter closed.

## 2018-11-29 NOTE — Progress Notes (Signed)
Virtual Visit via Video Note The patient didn't show for the virtual visit, after 10 minutes of waiting I disconnected. Nurse manager tried calling the patient, no answer.

## 2018-12-06 ENCOUNTER — Telehealth: Payer: Self-pay | Admitting: Obstetrics and Gynecology

## 2018-12-06 NOTE — Telephone Encounter (Signed)
Spoke with patient. Request to reschedule with Dr. Oscar La to discuss contraceptives, considering depo provera. Patient also reports white vaginal d/c, no odor and discomfort. Used a new lubricant with partner, symptoms started after protected intercourse. Request OV if possible. Covid 19 screening negative. OV scheduled with Dr. Oscar La on 4/22 at 0930.   Routing to provider for final review. Patient is agreeable to disposition. Will close encounter.

## 2018-12-06 NOTE — Telephone Encounter (Signed)
Patient would like to reschedule her webex appointment from last week that she had to cancel.

## 2018-12-07 ENCOUNTER — Encounter: Payer: Self-pay | Admitting: Obstetrics and Gynecology

## 2018-12-07 ENCOUNTER — Ambulatory Visit (INDEPENDENT_AMBULATORY_CARE_PROVIDER_SITE_OTHER): Payer: BC Managed Care – PPO | Admitting: Obstetrics and Gynecology

## 2018-12-07 ENCOUNTER — Other Ambulatory Visit: Payer: Self-pay

## 2018-12-07 VITALS — BP 124/80 | HR 70 | Temp 98.1°F | Resp 16 | Wt 234.0 lb

## 2018-12-07 DIAGNOSIS — Z3009 Encounter for other general counseling and advice on contraception: Secondary | ICD-10-CM | POA: Diagnosis not present

## 2018-12-07 DIAGNOSIS — N760 Acute vaginitis: Secondary | ICD-10-CM | POA: Diagnosis not present

## 2018-12-07 DIAGNOSIS — B9689 Other specified bacterial agents as the cause of diseases classified elsewhere: Secondary | ICD-10-CM

## 2018-12-07 DIAGNOSIS — B373 Candidiasis of vulva and vagina: Secondary | ICD-10-CM

## 2018-12-07 DIAGNOSIS — B3731 Acute candidiasis of vulva and vagina: Secondary | ICD-10-CM

## 2018-12-07 DIAGNOSIS — Z113 Encounter for screening for infections with a predominantly sexual mode of transmission: Secondary | ICD-10-CM | POA: Diagnosis not present

## 2018-12-07 MED ORDER — NORETHINDRONE 0.35 MG PO TABS: 1.0000 | ORAL_TABLET | Freq: Every day | ORAL | 0 refills | Status: DC

## 2018-12-07 MED ORDER — FLUCONAZOLE 150 MG PO TABS: 150.0000 mg | ORAL_TABLET | Freq: Once | ORAL | 0 refills | Status: AC

## 2018-12-07 MED ORDER — METRONIDAZOLE 500 MG PO TABS: 500.0000 mg | ORAL_TABLET | Freq: Two times a day (BID) | ORAL | 0 refills | Status: DC

## 2018-12-07 NOTE — Progress Notes (Signed)
GYNECOLOGY  VISIT   HPI: 28 y.o.   Single Black or African American Not Hispanic or Latino  female   [id number] with Patient's last menstrual period was 11/17/2018 (exact date).   here for  Contraceptive consult & yeast. She used a lubricant 3 days ago, the next day she noticed a thick white d/c, itching and irritation. No odor.  She went off of LoLoestrin 1.5 months ago. She was on it for a few months. Prior to the pill her cycles were monthly x 7 days, could saturate an ultra tampon in 3-4 hours. No BTB. Bad cramps.  Currently using condoms. New partner since February.   GYNECOLOGIC HISTORY: Patient's last menstrual period was 11/17/2018 (exact date). Contraception: condoms Menopausal hormone therapy: none        OB History    Gravida  1   Para      Term      Preterm      AB  1   Living        SAB      TAB  1   Ectopic      Multiple      Live Births                 Patient Active Problem List   Diagnosis Date Noted  . Type 2 diabetes mellitus without complication, without long-term current use of insulin (HCC) 11/09/2018  . Hypertension 11/09/2018  . Anxiety 11/09/2018  . 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 CIN II (cervical intraepithelial neoplasia II) 07/28/2018  . 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 Palpitations 01/12/2018  . Elevated troponin 01/12/2018  . DKA (diabetic ketoacidoses) (HCC) 08/27/2017  . Hyperglycemia 08/13/2017  . Iron deficiency anemia 09/04/2016  . Hyperthyroidism 07/05/2014  . Thrombocytopenia (HCC) 05/27/2014  . Hypercholesteremia     Past Medical History:  Diagnosis Date  . Abnormal Pap smear of cervix 2019   LSIL  . Abortion May 2013  . Anemia   . Diabetes mellitus without complication (HCC)   . History of ITP   . History of palpitations    Cardiac event monitor June 2019: Normal.  Sinus rhythm no arrhythmias or significant PACs/PVCs.  Marland Kitchen  Hypercholesteremia   . Hypertension   . Hyperthyroidism   . Iron deficiency anemia 09/04/2016  . Peripheral vertigo     Past Surgical History:  Procedure Laterality Date  . LAPAROSCOPIC APPENDECTOMY N/A 01/14/2017   Procedure: APPENDECTOMY LAPAROSCOPIC;  Surgeon: Claud Kelp, MD;  Location: [facility];  Service: General;  Laterality: N/A;    Current Outpatient Medications  Medication Sig Dispense Refill  . Acetaminophen (TYLENOL PO) Take by mouth.    Marland Kitchen atorvastatin (LIPITOR) 40 MG tablet Take 1 tablet (40 mg total) by mouth daily. 90 tablet 3  . Biotin 16109 MCG TABS Take 1,000 mcg by mouth daily.     . busPIRone (BUSPAR) 10 MG tablet Take 1 tablet (10 mg total) by mouth daily. 90 tablet 2  . cetirizine (ZYRTEC) 10 MG tablet Take 1 tablet (10 mg total) by mouth daily as needed for allergies. 90 tablet 1  . IRON PO Take 1 tablet by mouth daily.     Marland Kitchen lisinopril (PRINIVIL,ZESTRIL) 5 MG tablet Take 1 tablet (5 mg total) by mouth daily. 90 tablet 2  . meclizine (ANTIVERT) 25 MG tablet Take 1 tablet (25 mg total) by mouth 3 (three) times daily as needed for dizziness. 30 tablet 0  .  methimazole (TAPAZOLE) 10 MG tablet Take 1 tablet (10 mg total) by mouth daily. 90 tablet 3  . metoprolol tartrate (LOPRESSOR) 25 MG tablet Take 0.5 tablets (12.5 mg total) by mouth every morning. 90 tablet 2  . Multiple Vitamins-Minerals (MULTIVITAMIN ADULT PO) Take by mouth daily.     No current facility-administered medications for this visit.     Social history: She works at [employer]. Lives alone. Smokes 1 pack in 3 days. Occasional ETOH.   ALLERGIES: Benadryl [diphenhydramine hcl] and Chloroxine  Family History  Problem Relation Age of Onset  . Diabetes Other        Parent  . Hypertension Other        Parent  . Hyperlipidemia Other        Parent  . Arthritis Other        Parent  . Breast cancer Mother     Social History   Socioeconomic History  . Marital status: Single    Spouse name: Not  on file  . Number of children: Not on file  . Years of education: 3  . Highest education level: Not on file  Occupational History  . 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 Financial resource strain: Not on file  . Food insecurity:    Worry: Not on file    Inability: Not on file  . Transportation needs:    Medical: Not on file    Non-medical: Not on file  Tobacco Use  . Smoking status: Current Every Day Smoker    Packs/day: 0.25    Types: Cigarettes  . Smokeless tobacco: Never Used  Substance and Sexual Activity  . Alcohol use: Not Currently  . Drug use: No  . Sexual activity: Yes    Partners: Male    Birth control/protection: Condom  Lifestyle  . Physical activity:    Days per week: Not on file    Minutes per session: Not on file  . Stress: Not on file  Relationships  . Social connections:    Talks on phone: Not on file    Gets together: Not on file    Attends religious service: Not on file    Active member of club or organization: Not on file    Attends meetings of clubs or organizations: Not on file    Relationship status: Not on file  . Intimate partner violence:    Fear of current or ex partner: Not on file    Emotionally abused: Not on file    Physically abused: Not on file    Forced sexual activity: Not on file  Other Topics Concern  . Not on file  Social History Narrative   Regular exercise-yes   Caffeine Use-yes          Review of Systems  Skin:       Vaginal itching & white disharge    PHYSICAL EXAMINATION:    BP 124/80   Pulse 70   Temp 98.1 F (36.7 C) (Oral)   Resp 16   Wt 234 lb (106.1 kg)   LMP 11/17/2018 (Exact Date)   BMI 34.56 kg/m     General appearance: alert, cooperative and appears stated age   Pelvic: External genitalia:  no lesions, mild erythema              Urethra:  normal appearing urethra with no masses, tenderness or lesions              Bartholins and Skenes: normal  Vagina: normal appearing  vagina with an increase in watery and thick vaginal d/c              Cervix: no lesions               Chaperone was present for exam.  Wet prep: ++ clue, no trich, + wbc KOH: ++ yeast PH: 5   ASSESSMENT Contraception counseling BV Yeast STD testing   PLAN Will start micronor Continue using condoms Treat with flagyl and diflucan Nuswab for GC/CT/Trich RPR, HIV testing    An After Visit Summary was printed and given to the patient.  ~30 minutes face to face time of which over 50% was spent in counseling.

## 2018-12-07 NOTE — Patient Instructions (Addendum)
Norethindrone tablets (contraception) What is this medicine? NORETHINDRONE (nor eth IN drone) is an oral contraceptive. The product contains a female hormone known as a progestin. It is used to prevent pregnancy. This medicine may be used for other purposes; ask your health care provider or pharmacist if you have questions. COMMON BRAND NAME(S): Camila, Deblitane 28-Day, Errin, Heather, [place], Jolivette, [place], Nor-QD, Nora-BE, Norlyroc, [facility], [employer] 28-Day What should I tell my health care provider before I take this medicine? They need to know if you have any of these conditions: -blood vessel disease or blood clots -breast, cervical, or vaginal cancer -diabetes -heart disease -kidney disease -liver disease -mental depression -migraine -seizures -stroke -vaginal bleeding -an unusual or allergic reaction to norethindrone, other medicines, foods, dyes, or preservatives -pregnant or trying to get pregnant -breast-feeding How should I use this medicine? Take this medicine by mouth with a glass of water. You may take it with or without food. Follow the directions on the prescription label. Take this medicine at the same time each day and in the order directed on the package. Do not take your medicine more often than directed. Contact your pediatrician regarding the use of this medicine in children. Special care may be needed. This medicine has been used in female children who have started having menstrual periods. A patient package insert for the product will be given with each prescription and refill. Read this sheet carefully each time. The sheet may change frequently. Overdosage: If you think you have taken too much of this medicine contact a poison control center or emergency room at once. NOTE: This medicine is only for you. Do not share this medicine with others. What if I miss a dose? Try not to miss a dose. Every time you miss a dose or take a dose late your chance of  pregnancy increases. When 1 pill is missed (even if only 3 hours late), take the missed pill as soon as possible and continue taking a pill each day at the regular time (use a back up method of birth control for the next 48 hours). If more than 1 dose is missed, use an additional birth control method for the rest of your pill pack until menses occurs. Contact your health care professional if more than 1 dose has been missed. What may interact with this medicine? Do not take this medicine with any of the following medications: -amprenavir or fosamprenavir -bosentan This medicine may also interact with the following medications: -antibiotics or medicines for infections, especially rifampin, rifabutin, rifapentine, and griseofulvin, and possibly penicillins or tetracyclines -aprepitant -barbiturate medicines, such as phenobarbital -carbamazepine -felbamate -modafinil -oxcarbazepine -phenytoin -ritonavir or other medicines for HIV infection or AIDS -St. John's wort -topiramate This list may not describe all possible interactions. Give your health care provider a list of all the medicines, herbs, non-prescription drugs, or dietary supplements you use. Also tell them if you smoke, drink alcohol, or use illegal drugs. Some items may interact with your medicine. What should I watch for while using this medicine? Visit your doctor or health care professional for regular checks on your progress. You will need a regular breast and pelvic exam and Pap smear while on this medicine. Use an additional method of birth control during the first cycle that you take these tablets. If you have any reason to think you are pregnant, stop taking this medicine right away and contact your doctor or health care professional. If you are taking this medicine for hormone related problems, it  may take several cycles of use to see improvement in your condition. This medicine does not protect you against HIV infection (AIDS)  or any other sexually transmitted diseases. What side effects may I notice from receiving this medicine? Side effects that you should report to your doctor or health care professional as soon as possible: -breast tenderness or discharge -pain in the abdomen, chest, groin or leg -severe headache -skin rash, itching, or hives -sudden shortness of breath -unusually weak or tired -vision or speech problems -yellowing of skin or eyes Side effects that usually do not require medical attention (report to your doctor or health care professional if they continue or are bothersome): -changes in sexual desire -change in menstrual flow -facial hair growth -fluid retention and swelling -headache -irritability -nausea -weight gain or loss This list may not describe all possible side effects. Call your doctor for medical advice about side effects. You may report side effects to FDA at 1-800-FDA-1088. Where should I keep my medicine? Keep out of the reach of children. Store at room temperature between 15 and 30 degrees C (59 and 86 degrees F). Throw away any unused medicine after the expiration date. NOTE: This sheet is a summary. It may not cover all possible information. If you have questions about this medicine, talk to your doctor, pharmacist, or health care provider.  2019 Elsevier/Gold Standard (2012-04-22 16:41:35) Contraception Choices Contraception, also called birth control, refers to methods or devices that prevent pregnancy. Hormonal methods Contraceptive implant  A contraceptive implant is a thin, plastic tube that contains a hormone. It is inserted into the upper part of the arm. It can remain in place for up to 3 years. Progestin-only injections Progestin-only injections are injections of progestin, a synthetic form of the hormone progesterone. They are given every 3 months by a health care provider. Birth control pills  Birth control pills are pills that contain hormones that  prevent pregnancy. They must be taken once a day, preferably at the same time each day. Birth control patch  The birth control patch contains hormones that prevent pregnancy. It is placed on the skin and must be changed once a week for three weeks and removed on the fourth week. A prescription is needed to use this method of contraception. Vaginal ring  A vaginal ring contains hormones that prevent pregnancy. It is placed in the vagina for three weeks and removed on the fourth week. After that, the process is repeated with a new ring. A prescription is needed to use this method of contraception. Emergency contraceptive Emergency contraceptives prevent pregnancy after unprotected sex. They come in pill form and can be taken up to 5 days after sex. They work best the sooner they are taken after having sex. Most emergency contraceptives are available without a prescription. This method should not be used as your only form of birth control. Barrier methods Female condom  A female condom is a thin sheath that is worn over the penis during sex. Condoms keep sperm from going inside a woman's body. They can be used with a spermicide to increase their effectiveness. They should be disposed after a single use. Female condom  A female condom is a soft, loose-fitting sheath that is put into the vagina before sex. The condom keeps sperm from going inside a woman's body. They should be disposed after a single use. Diaphragm  A diaphragm is a soft, dome-shaped barrier. It is inserted into the vagina before sex, along with a spermicide. The diaphragm blocks  sperm from entering the uterus, and the spermicide kills sperm. A diaphragm should be left in the vagina for 6-8 hours after sex and removed within 24 hours. A diaphragm is prescribed and fitted by a health care provider. A diaphragm should be replaced every 1-2 years, after giving birth, after gaining more than 15 lb (6.8 kg), and after pelvic surgery. Cervical  cap  A cervical cap is a round, soft latex or plastic cup that fits over the cervix. It is inserted into the vagina before sex, along with spermicide. It blocks sperm from entering the uterus. The cap should be left in place for 6-8 hours after sex and removed within 48 hours. A cervical cap must be prescribed and fitted by a health care provider. It should be replaced every 2 years. Sponge  A sponge is a soft, circular piece of polyurethane foam with spermicide on it. The sponge helps block sperm from entering the uterus, and the spermicide kills sperm. To use it, you make it wet and then insert it into the vagina. It should be inserted before sex, left in for at least 6 hours after sex, and removed and thrown away within 30 hours. Spermicides Spermicides are chemicals that kill or block sperm from entering the cervix and uterus. They can come as a cream, jelly, suppository, foam, or tablet. A spermicide should be inserted into the vagina with an applicator at least 10-15 minutes before sex to allow time for it to work. The process must be repeated every time you have sex. Spermicides do not require a prescription. Intrauterine contraception Intrauterine device (IUD) An IUD is a T-shaped device that is put in a woman's uterus. There are two types:  Hormone IUD.This type contains progestin, a synthetic form of the hormone progesterone. This type can stay in place for 3-5 years.  Copper IUD.This type is wrapped in copper wire. It can stay in place for 10 years.  Permanent methods of contraception Female tubal ligation In this method, a woman's fallopian tubes are sealed, tied, or blocked during surgery to prevent eggs from traveling to the uterus. Hysteroscopic sterilization In this method, a small, flexible insert is placed into each fallopian tube. The inserts cause scar tissue to form in the fallopian tubes and block them, so sperm cannot reach an egg. The procedure takes about 3 months to be  effective. Another form of birth control must be used during those 3 months. Female sterilization This is a procedure to tie off the tubes that carry sperm (vasectomy). After the procedure, the man can still ejaculate fluid (semen). Natural planning methods Natural family planning In this method, a couple does not have sex on days when the woman could become pregnant. Calendar method This means keeping track of the length of each menstrual cycle, identifying the days when pregnancy can happen, and not having sex on those days. Ovulation method In this method, a couple avoids sex during ovulation. Symptothermal method This method involves not having sex during ovulation. The woman typically checks for ovulation by watching changes in her temperature and in the consistency of cervical mucus. Post-ovulation method In this method, a couple waits to have sex until after ovulation. Summary  Contraception, also called birth control, means methods or devices that prevent pregnancy.  Hormonal methods of contraception include implants, injections, pills, patches, vaginal rings, and emergency contraceptives.  Barrier methods of contraception can include female condoms, female condoms, diaphragms, cervical caps, sponges, and spermicides.  There are two types of IUDs (  intrauterine devices). An IUD can be put in a woman's uterus to prevent pregnancy for 3-5 years.  Permanent sterilization can be done through a procedure for males, females, or both.  Natural family planning methods involve not having sex on days when the woman could become pregnant. This information is not intended to replace advice given to you by your health care provider. Make sure you discuss any questions you have with your health care provider. Document Released: 08/03/2005 Document Revised: 08/05/2017 Document Reviewed: 09/05/2016 Elsevier Interactive Patient Education  2019 Elsevier Inc.  Vaginal Yeast infection, Adult   Vaginal yeast infection is a condition that causes vaginal discharge as well as soreness, swelling, and redness (inflammation) of the vagina. This is a common condition. Some women get this infection frequently. What are the causes? This condition is caused by a change in the normal balance of the yeast (candida) and bacteria that live in the vagina. This change causes an overgrowth of yeast, which causes the inflammation. What increases the risk? The condition is more likely to develop in women who:  Take antibiotic medicines.  Have diabetes.  Take birth control pills.  Are pregnant.  Douche often.  Have a weak body defense system (immune system).  Have been taking steroid medicines for a long time.  Frequently wear tight clothing. What are the signs or symptoms? Symptoms of this condition include:  White, thick, creamy vaginal discharge.  Swelling, itching, redness, and irritation of the vagina. The lips of the vagina (vulva) may be affected as well.  Pain or a burning feeling while urinating.  Pain during sex. How is this diagnosed? This condition is diagnosed based on:  Your medical history.  A physical exam.  A pelvic exam. Your health care provider will examine a sample of your vaginal discharge under a microscope. Your health care provider may send this sample for testing to confirm the diagnosis. How is this treated? This condition is treated with medicine. Medicines may be over-the-counter or prescription. You may be told to use one or more of the following:  Medicine that is taken by mouth (orally).  Medicine that is applied as a cream (topically).  Medicine that is inserted directly into the vagina (suppository). Follow these instructions at home:  Lifestyle  Do not have sex until your health care provider approves. Tell your sex partner that you have a yeast infection. That person should go to his or her health care provider and ask if they should also  be treated.  Do not wear tight clothes, such as pantyhose or tight pants.  Wear breathable cotton underwear. General instructions  Take or apply over-the-counter and prescription medicines only as told by your health care provider.  Eat more yogurt. This may help to keep your yeast infection from returning.  Do not use tampons until your health care provider approves.  Try taking a sitz bath to help with discomfort. This is a warm water bath that is taken while you are sitting down. The water should only come up to your hips and should cover your buttocks. Do this 3-4 times per day or as told by your health care provider.  Do not douche.  If you have diabetes, keep your blood sugar levels under control.  Keep all follow-up visits as told by your health care provider. This is important. Contact a health care provider if:  You have a fever.  Your symptoms go away and then return.  Your symptoms do not get better with treatment.  Your symptoms get worse.  You have new symptoms.  You develop blisters in or around your vagina.  You have blood coming from your vagina and it is not your menstrual period.  You develop pain in your abdomen. Summary  Vaginal yeast infection is a condition that causes discharge as well as soreness, swelling, and redness (inflammation) of the vagina.  This condition is treated with medicine. Medicines may be over-the-counter or prescription.  Take or apply over-the-counter and prescription medicines only as told by your health care provider.  Do not douche. Do not have sex or use tampons until your health care provider approves.  Contact a health care provider if your symptoms do not get better with treatment or your symptoms go away and then return. This information is not intended to replace advice given to you by your health care provider. Make sure you discuss any questions you have with your health care provider. Document Released:  05/13/2005 Document Revised: 12/20/2017 Document Reviewed: 12/20/2017 Elsevier Interactive Patient Education  2019 [employer].  Vaginitis Vaginitis is a condition in which the vaginal tissue swells and becomes red (inflamed). This condition is most often caused by a change in the normal balance of bacteria and yeast that live in the vagina. This change causes an overgrowth of certain bacteria or yeast, which causes the inflammation. There are different types of vaginitis, but the most common types are:  Bacterial vaginosis.  Yeast infection (candidiasis).  Trichomoniasis vaginitis. This is a sexually transmitted disease (STD).  Viral vaginitis.  Atrophic vaginitis.  Allergic vaginitis. What are the causes? The cause of this condition depends on the type of vaginitis. It can be caused by:  Bacteria (bacterial vaginosis).  Yeast, which is a fungus (yeast infection).  A parasite (trichomoniasis vaginitis).  A virus (viral vaginitis).  Low hormone levels (atrophic vaginitis). Low hormone levels can occur during pregnancy, breastfeeding, or after menopause.  Irritants, such as bubble baths, scented tampons, and feminine sprays (allergic vaginitis). Other factors can change the normal balance of the yeast and bacteria that live in the vagina. These include:  Antibiotic medicines.  Poor hygiene.  Diaphragms, vaginal sponges, spermicides, birth control pills, and intrauterine devices (IUD).  Sex.  Infection.  Uncontrolled diabetes.  A weakened defense (immune) system. What increases the risk? This condition is more likely to develop in women who:  Smoke.  Use vaginal douches, scented tampons, or scented sanitary pads.  Wear tight-fitting pants.  Wear thong underwear.  Use oral birth control pills or an IUD.  Have sex without a condom.  Have multiple sex partners.  Have an STD.  Frequently use the spermicide nonoxynol-9.  Eat lots of foods high in sugar.   Have uncontrolled diabetes.  Have low estrogen levels.  Have a weakened immune system from an immune disorder or medical treatment.  Are pregnant or breastfeeding. What are the signs or symptoms? Symptoms vary depending on the cause of the vaginitis. Common symptoms include:  Abnormal vaginal discharge. ? The discharge is white, gray, or yellow with bacterial vaginosis. ? The discharge is thick, white, and cheesy with a yeast infection. ? The discharge is frothy and yellow or greenish with trichomoniasis.  A bad vaginal smell. The smell is fishy with bacterial vaginosis.  Vaginal itching, pain, or swelling.  Sex that is painful.  Pain or burning when urinating. Sometimes there are no symptoms. How is this diagnosed? This condition is diagnosed based on your symptoms and medical history. A physical exam, including a  pelvic exam, will also be done. You may also have other tests, including:  Tests to determine the pH level (acidity or alkalinity) of your vagina.  A whiff test, to assess the odor that results when a sample of your vaginal discharge is mixed with a potassium hydroxide solution.  Tests of vaginal fluid. A sample will be examined under a microscope. How is this treated? Treatment varies depending on the type of vaginitis you have. Your treatment may include:  Antibiotic creams or pills to treat bacterial vaginosis and trichomoniasis.  Antifungal medicines, such as vaginal creams or suppositories, to treat a yeast infection.  Medicine to ease discomfort if you have viral vaginitis. Your sexual partner should also be treated.  Estrogen delivered in a cream, pill, suppository, or vaginal ring to treat atrophic vaginitis. If vaginal dryness occurs, lubricants and moisturizing creams may help. You may need to avoid scented soaps, sprays, or douches.  Stopping use of a product that is causing allergic vaginitis. Then using a vaginal cream to treat the symptoms.  Follow these instructions at home: Lifestyle  Keep your genital area clean and dry. Avoid soap, and only rinse the area with water.  Do not douche or use tampons until your health care provider says it is okay to do so. Use sanitary pads, if needed.  Do not have sex until your health care provider approves. When you can return to sex, practice safe sex and use condoms.  Wipe from front to back. This avoids the spread of bacteria from the rectum to the vagina. General instructions  Take over-the-counter and prescription medicines only as told by your health care provider.  If you were prescribed an antibiotic medicine, take or use it as told by your health care provider. Do not stop taking or using the antibiotic even if you start to feel better.  Keep all follow-up visits as told by your health care provider. This is important. How is this prevented?  Use mild, non-scented products. Do not use things that can irritate the vagina, such as fabric softeners. Avoid the following products if they are scented: ? Feminine sprays. ? Detergents. ? Tampons. ? Feminine hygiene products. ? Soaps or bubble baths.  Let air reach your genital area. ? Wear cotton underwear to reduce moisture buildup. ? Avoid wearing underwear while you sleep. ? Avoid wearing tight pants and underwear or nylons without a cotton panel. ? Avoid wearing thong underwear.  Take off any wet clothing, such as bathing suits, as soon as possible.  Practice safe sex and use condoms. Contact a health care provider if:  You have abdominal pain.  You have a fever.  You have symptoms that last for more than 2-3 days. Get help right away if:  You have a fever and your symptoms suddenly get worse. Summary  Vaginitis is a condition in which the vaginal tissue becomes inflamed.This condition is most often caused by a change in the normal balance of bacteria and yeast that live in the vagina.  Treatment varies  depending on the type of vaginitis you have.  Do not douche, use tampons , or have sex until your health care provider approves. When you can return to sex, practice safe sex and use condoms. This information is not intended to replace advice given to you by your health care provider. Make sure you discuss any questions you have with your health care provider. Document Released: 05/31/2007 Document Revised: 09/08/2016 Document Reviewed: 09/08/2016 Elsevier Interactive Patient Education  2019  [employer].

## 2018-12-08 LAB — RPR: RPR Ser Ql: NONREACTIVE

## 2018-12-08 LAB — HIV ANTIBODY (ROUTINE TESTING W REFLEX): HIV Screen 4th Generation wRfx: NONREACTIVE

## 2018-12-09 LAB — CHLAMYDIA/GONOCOCCUS/TRICHOMONAS, NAA
Chlamydia by NAA: NEGATIVE
Gonococcus by NAA: NEGATIVE
Trich vag by NAA: NEGATIVE

## 2018-12-13 ENCOUNTER — Ambulatory Visit: Payer: BLUE CROSS/BLUE SHIELD | Admitting: Endocrinology

## 2018-12-13 ENCOUNTER — Telehealth: Payer: Self-pay

## 2018-12-13 NOTE — Telephone Encounter (Signed)
Patient was prescribed Meclizine back in March from ED and would like a refill. Is it okay to fill or do you need a visit patient for the vertigo. Patient is aware that you are out of office until Monday.

## 2018-12-14 ENCOUNTER — Ambulatory Visit (INDEPENDENT_AMBULATORY_CARE_PROVIDER_SITE_OTHER): Payer: BLUE CROSS/BLUE SHIELD | Admitting: Endocrinology

## 2018-12-14 ENCOUNTER — Other Ambulatory Visit: Payer: Self-pay

## 2018-12-14 ENCOUNTER — Encounter: Payer: Self-pay | Admitting: Endocrinology

## 2018-12-14 VITALS — BP 142/78 | HR 79 | Ht 69.0 in | Wt 233.0 lb

## 2018-12-14 DIAGNOSIS — E0590 Thyrotoxicosis, unspecified without thyrotoxic crisis or storm: Secondary | ICD-10-CM

## 2018-12-14 LAB — TSH: TSH: 4.01 u[IU]/mL (ref 0.35–4.50)

## 2018-12-14 LAB — T4, FREE: Free T4: 0.99 ng/dL (ref 0.60–1.60)

## 2018-12-14 MED ORDER — MECLIZINE HCL 25 MG PO TABS: 25.0000 mg | ORAL_TABLET | Freq: Three times a day (TID) | ORAL | 0 refills | Status: DC | PRN

## 2018-12-14 NOTE — Telephone Encounter (Signed)
Left a vm that medication has been sent to pharmacy  

## 2018-12-14 NOTE — Patient Instructions (Addendum)
Your blood pressure is high today.  Please see your primary care provider soon, to have it rechecked Blood tests are requested for you today.  We'll let you know about the results.  It is best to never miss your methimazole. However, if you do miss it, next best is to take 2 the next day.  Please come back for a follow-up appointment in 4 months.  In view of your medical condition, you should avoid pregnancy until we have decided it is safe.   If ever you have fever while taking methimazole, stop it and call us, even if the reason is obvious, because of the risk of a rare side-effect.

## 2018-12-14 NOTE — Addendum Note (Signed)
Addended by: Lisabeth Pick on: 12/14/2018 10:50 AM   Modules accepted: Orders

## 2018-12-14 NOTE — Progress Notes (Signed)
Subjective:    Patient ID: Kellie Shaffer, female    DOB: Dec 15, 1990, 28 y.o.   MRN: [id number]  HPI Pt returns for f/u of hyperthyroidism (dx'ed 2013, during a pregnancy; US showed a small multinodular goiter, but she also has proptosis; she was then rx'ed with tapazole; she was lost to f/u, but hyperthyroidism was found again during a hospitalization in late 2015 for appendicitis; she was restarted on tapazole then; pt says she is not at risk for another pregnancy; f/u TFT in 2018 were normal off rx; later in 2018, it was resumed due to suppressed TSH).   She says she never misses the methimazole.  pt states she feels well in general.  Specifically, she denies palpitations and tremor.  She says she is not at risk for pregnancy.   Past Medical History:  Diagnosis Date  . Abnormal Pap smear of cervix 2019   LSIL  . Abortion May 2013  . Anemia   . Diabetes mellitus without complication (HCC)   . History of ITP   . History of palpitations    Cardiac event monitor June 2019: Normal.  Sinus rhythm no arrhythmias or significant PACs/PVCs.  Marland Kitchen. Hypercholesteremia   . Hypertension   . Hyperthyroidism   . Iron deficiency anemia 09/04/2016  . Peripheral vertigo     Past Surgical History:  Procedure Laterality Date  . LAPAROSCOPIC APPENDECTOMY N/A 01/14/2017   Procedure: APPENDECTOMY LAPAROSCOPIC;  Surgeon: Claud KelpIngram, Haywood, MD;  Location: [facility];  Service: General;  Laterality: N/A;    Social History   Socioeconomic History  . Marital status: Single    Spouse name: Not on file  . Number of children: Not on file  . Years of education: 614  . Highest education level: Not on file  Occupational History  . 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 Financial resource strain: Not on file  . Food insecurity:    Worry: Not on file    Inability: Not on file  . Transportation needs:    Medical: Not on file    Non-medical: Not on file  Tobacco Use  . Smoking status: Current Every Day  Smoker    Packs/day: 0.25    Types: Cigarettes  . Smokeless tobacco: Never Used  Substance and Sexual Activity  . Alcohol use: Not Currently  . Drug use: No  . Sexual activity: Yes    Partners: Male    Birth control/protection: Condom  Lifestyle  . Physical activity:    Days per week: Not on file    Minutes per session: Not on file  . Stress: Not on file  Relationships  . Social connections:    Talks on phone: Not on file    Gets together: Not on file    Attends religious service: Not on file    Active member of club or organization: Not on file    Attends meetings of clubs or organizations: Not on file    Relationship status: Not on file  . Intimate partner violence:    Fear of current or ex partner: Not on file    Emotionally abused: Not on file    Physically abused: Not on file    Forced sexual activity: Not on file  Other Topics Concern  . Not on file  Social History Narrative   Regular exercise-yes   Caffeine Use-yes          Current Outpatient Medications on File Prior to Visit  Medication Sig Dispense Refill  .  Acetaminophen (TYLENOL PO) Take by mouth.    Marland Kitchen atorvastatin (LIPITOR) 40 MG tablet Take 1 tablet (40 mg total) by mouth daily. 90 tablet 3  . Biotin 41740 MCG TABS Take 1,000 mcg by mouth daily.     . busPIRone (BUSPAR) 10 MG tablet Take 1 tablet (10 mg total) by mouth daily. 90 tablet 2  . cetirizine (ZYRTEC) 10 MG tablet Take 1 tablet (10 mg total) by mouth daily as needed for allergies. 90 tablet 1  . IRON PO Take 1 tablet by mouth daily.     Marland Kitchen lisinopril (PRINIVIL,ZESTRIL) 5 MG tablet Take 1 tablet (5 mg total) by mouth daily. 90 tablet 2  . methimazole (TAPAZOLE) 10 MG tablet Take 1 tablet (10 mg total) by mouth daily. 90 tablet 3  . metoprolol tartrate (LOPRESSOR) 25 MG tablet Take 0.5 tablets (12.5 mg total) by mouth every morning. 90 tablet 2  . metroNIDAZOLE (FLAGYL) 500 MG tablet Take 1 tablet (500 mg total) by mouth 2 (two) times daily. 14  tablet 0  . Multiple Vitamins-Minerals (MULTIVITAMIN ADULT PO) Take by mouth daily.    . norethindrone (MICRONOR) 0.35 MG tablet Take 1 tablet (0.35 mg total) by mouth daily. 3 Package 0  . meclizine (ANTIVERT) 25 MG tablet Take 1 tablet (25 mg total) by mouth 3 (three) times daily as needed for dizziness. 30 tablet 0   No current facility-administered medications on file prior to visit.     Allergies  Allergen Reactions  . Benadryl [Diphenhydramine Hcl] Anaphylaxis and Hives  . Chloroxine Hives    Clorox    Family History  Problem Relation Age of Onset  . Diabetes Other        Parent  . Hypertension Other        Parent  . Hyperlipidemia Other        Parent  . Arthritis Other        Parent  . Breast cancer Mother     BP (!) 142/78 (BP Location: Left Arm, Patient Position: Sitting, Cuff Size: Large)   Pulse 79   Ht 5\' 9"  (1.753 m)   Wt 233 lb (105.7 kg)   LMP 11/17/2018 (Exact Date)   SpO2 94%   BMI 34.41 kg/m    Review of Systems Denies fever.      Objective:   Physical Exam VITAL SIGNS:  See vs page GENERAL: no distress NECK: There is no palpable thyroid enlargement.  No thyroid nodule is palpable.  No palpable lymphadenopathy at the anterior neck.       Assessment & Plan:  HTN: is noted today.  Hyperthyroidism: due for recheck.  Palpitations: resolved, poss due to rx of hyperthyroidism.  Patient Instructions  Your blood pressure is high today.  Please see your primary care provider soon, to have it rechecked Blood tests are requested for you today.  We'll let you know about the results.  It is best to never miss your methimazole. However, if you do miss it, next best is to take 2 the next day.  Please come back for a follow-up appointment in 4 months.  In view of your medical condition, you should avoid pregnancy until we have decided it is safe.   If ever you have fever while taking methimazole, stop it and call us, even if the reason is obvious, because  of the risk of a rare side-effect.

## 2018-12-22 NOTE — Progress Notes (Signed)
Note not needed 

## 2019-01-09 ENCOUNTER — Other Ambulatory Visit: Payer: Self-pay | Admitting: Family Medicine

## 2019-01-12 ENCOUNTER — Ambulatory Visit (INDEPENDENT_AMBULATORY_CARE_PROVIDER_SITE_OTHER): Payer: BC Managed Care – PPO | Admitting: Obstetrics & Gynecology

## 2019-01-12 ENCOUNTER — Other Ambulatory Visit (HOSPITAL_COMMUNITY)
Admission: RE | Admit: 2019-01-12 | Discharge: 2019-01-12 | Disposition: A | Discharge status: Home / Self Care | Payer: BLUE CROSS/BLUE SHIELD | Source: Ambulatory Visit | Attending: Obstetrics & Gynecology | Admitting: Obstetrics & Gynecology

## 2019-01-12 ENCOUNTER — Other Ambulatory Visit: Payer: Self-pay

## 2019-01-12 ENCOUNTER — Encounter: Payer: Self-pay | Admitting: Obstetrics & Gynecology

## 2019-01-12 VITALS — BP 132/80 | HR 96 | Temp 97.3°F | Ht 69.0 in | Wt 232.0 lb

## 2019-01-12 DIAGNOSIS — N871 Moderate cervical dysplasia: Secondary | ICD-10-CM

## 2019-01-12 DIAGNOSIS — D069 Carcinoma in situ of cervix, unspecified: Secondary | ICD-10-CM | POA: Diagnosis not present

## 2019-01-12 DIAGNOSIS — Z124 Encounter for screening for malignant neoplasm of cervix: Secondary | ICD-10-CM | POA: Insufficient documentation

## 2019-01-12 DIAGNOSIS — Z716 Tobacco abuse counseling: Secondary | ICD-10-CM

## 2019-01-12 DIAGNOSIS — R748 Abnormal levels of other serum enzymes: Secondary | ICD-10-CM | POA: Diagnosis not present

## 2019-01-12 DIAGNOSIS — Z01419 Encounter for gynecological examination (general) (routine) without abnormal findings: Secondary | ICD-10-CM | POA: Diagnosis not present

## 2019-01-12 DIAGNOSIS — Z0000 Encounter for general adult medical examination without abnormal findings: Secondary | ICD-10-CM

## 2019-01-12 MED ORDER — NORETHINDRONE 0.35 MG PO TABS: 1.0000 | ORAL_TABLET | Freq: Every day | ORAL | 4 refills | Status: DC

## 2019-01-12 NOTE — Progress Notes (Signed)
28 y.o. G1P0010 Single Black or [place] American female here for annual exam.  Has recently started dating someone new in February.  Had STD testing in April.    Recently started on POPs for contraception.    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  Is a smoker.  Wants to discuss options for treatment.  Therapy, nicotine patches, Buproprione and chantix reviewed.  She was given information to read and she is interested in chantix but wants to do some research on her own.  Partner vapes so she knows it would be a good idea for them to stop at the same time.    Patient's last menstrual period was 12/25/2018.          Sexually active: Yes.    The current method of family planning is oral progesterone-only contraceptive and condoms every time. Exercising: No.   Smoker:  yes  Health Maintenance: Pap:  05-11-18 LGSIL, CINI, CINII on LEEP History of abnormal Pap:  yes TDaP:  2012 Pneumonia vaccine(s):  2011 Screening Labs: here today    reports that she has been smoking cigarettes. She has been smoking about 0.25 packs per day. She has never used smokeless tobacco. She reports previous alcohol use. She reports that she does not use drugs.  Past Medical History:  Diagnosis Date  . Abnormal Pap smear of cervix 2019   LSIL  . Abortion May 2013  . Anemia   . Diabetes mellitus without complication (HCC)   . History of ITP   . History of palpitations    Cardiac event monitor June 2019: Normal.  Sinus rhythm no arrhythmias or significant PACs/PVCs.  Marland Kitchen Hypercholesteremia   . Hypertension   . Hyperthyroidism   . Iron deficiency anemia 09/04/2016  . Peripheral vertigo     Past Surgical History:  Procedure Laterality Date  . LAPAROSCOPIC APPENDECTOMY N/A 01/14/2017   Procedure: APPENDECTOMY LAPAROSCOPIC;  Surgeon: Claud Kelp, MD;  Location: [facility];  Service: General;  Laterality: N/A;    Current Outpatient Medications  Medication Sig Dispense Refill  . Acetaminophen  (TYLENOL PO) Take by mouth.    Marland Kitchen atorvastatin (LIPITOR) 40 MG tablet Take 1 tablet (40 mg total) by mouth daily. 90 tablet 3  . Biotin 53646 MCG TABS Take 1,000 mcg by mouth daily.     . busPIRone (BUSPAR) 10 MG tablet Take 1 tablet (10 mg total) by mouth daily. 90 tablet 2  . cetirizine (ZYRTEC) 10 MG tablet Take 1 tablet (10 mg total) by mouth daily as needed for allergies. 90 tablet 1  . IRON PO Take 1 tablet by mouth daily.     Marland Kitchen lisinopril (PRINIVIL,ZESTRIL) 5 MG tablet Take 1 tablet (5 mg total) by mouth daily. 90 tablet 2  . meclizine (ANTIVERT) 25 MG tablet Take 1 tablet (25 mg total) by mouth 3 (three) times daily as needed for dizziness. 30 tablet 0  . methimazole (TAPAZOLE) 10 MG tablet Take 1 tablet (10 mg total) by mouth daily. 90 tablet 3  . metoprolol tartrate (LOPRESSOR) 25 MG tablet Take 0.5 tablets (12.5 mg total) by mouth every morning. 90 tablet 2  . Multiple Vitamins-Minerals (MULTIVITAMIN ADULT PO) Take by mouth daily.    . norethindrone (MICRONOR) 0.35 MG tablet Take 1 tablet (0.35 mg total) by mouth daily. 3 Package 0   No current facility-administered medications for this visit.     Family History  Problem Relation Age of Onset  . Diabetes Other  Parent  . Hypertension Other        Parent  . Hyperlipidemia Other        Parent  . Arthritis Other        Parent  . Breast cancer Mother     Review of Systems  All other systems reviewed and are negative.   Exam:   BP 132/80   Pulse 96   Temp (!) 97.3 F (36.3 C) (Temporal)   Ht 5\' 9"  (1.753 m)   Wt 232 lb (105.2 kg)   LMP 12/25/2018   BMI 34.26 kg/m   Height: 5\' 9"  (175.3 cm)  Ht Readings from Last 3 Encounters:  01/12/19 5\' 9"  (1.753 m)  12/14/18 5\' 9"  (1.753 m)  11/24/18 5\' 9"  (1.753 m)    General appearance: alert, cooperative and appears stated age Head: Normocephalic, without obvious abnormality, atraumatic Neck: no adenopathy, supple, symmetrical, trachea midline and thyroid normal to  inspection and palpation Lungs: clear to auscultation bilaterally Breasts: normal appearance, no masses or tenderness Heart: regular rate and rhythm Abdomen: soft, non-tender; bowel sounds normal; no masses,  no organomegaly Extremities: extremities normal, atraumatic, no cyanosis or edema Skin: Skin color, texture, turgor normal. No rashes or lesions Lymph nodes: Cervical, supraclavicular, and axillary nodes normal. No abnormal inguinal nodes palpated Neurologic: Grossly normal   Pelvic: External genitalia:  no lesions              Urethra:  normal appearing urethra with no masses, tenderness or lesions              Bartholins and Skenes: normal                 Vagina: normal appearing vagina with normal color and discharge, no lesions              Cervix: no lesions              Pap taken: Yes.   Bimanual Exam:  Uterus:  normal size, contour, position, consistency, mobility, non-tender              Adnexa: normal adnexa and no mass, fullness, tenderness               Rectovaginal: Confirms               Anus:  normal sphincter tone, no lesions  Chaperone was present for exam.  A:  Well Woman with normal exam SA with negative STD Mother with hx of breast cancer in her mid-40's Smoker, desirous of cessation Hypothyroidism, followed by Dr. Everardo AllEllison Hypertension Thrombocytopenia, followed by Dr. Arbutus PedMohamed  P:   Mammogram guidelines reviewed.  Will start at age 28.  She is going to check to see if her mother ever did genetic testing pap smear obtained today On micronor po q day.  #90 day supply/4RF. Options for treatment discussed for smoking cessation--patches, wellbutrin, Chantix.  Pt would like some information about Chantix Vit D and hepatic panel obtained today Return annually or prn  In additional to annual gyn exam and discussion of related concerns, we also discsused smoking cessation including treatment options, medications, risks/benefits.  Written information was given to  her as well.

## 2019-01-13 LAB — HEPATIC FUNCTION PANEL
ALT: 33 IU/L — ABNORMAL HIGH (ref 0–32)
AST: 14 IU/L (ref 0–40)
Albumin: 4.6 g/dL (ref 3.9–5.0)
Alkaline Phosphatase: 98 IU/L (ref 39–117)
Bilirubin Total: 0.5 mg/dL (ref 0.0–1.2)
Bilirubin, Direct: 0.2 mg/dL (ref 0.00–0.40)
Total Protein: 7.9 g/dL (ref 6.0–8.5)

## 2019-01-13 LAB — VITAMIN D 25 HYDROXY (VIT D DEFICIENCY, FRACTURES): Vit D, 25-Hydroxy: 27.3 ng/mL — ABNORMAL LOW (ref 30.0–100.0)

## 2019-01-14 ENCOUNTER — Emergency Department (HOSPITAL_BASED_OUTPATIENT_CLINIC_OR_DEPARTMENT_OTHER): Payer: BLUE CROSS/BLUE SHIELD

## 2019-01-14 ENCOUNTER — Other Ambulatory Visit: Payer: Self-pay

## 2019-01-14 ENCOUNTER — Emergency Department (HOSPITAL_BASED_OUTPATIENT_CLINIC_OR_DEPARTMENT_OTHER)
Admission: EM | Admit: 2019-01-14 | Discharge: 2019-01-14 | Disposition: A | Discharge status: Home / Self Care | Payer: BLUE CROSS/BLUE SHIELD | Attending: Emergency Medicine | Admitting: Emergency Medicine

## 2019-01-14 DIAGNOSIS — F17210 Nicotine dependence, cigarettes, uncomplicated: Secondary | ICD-10-CM | POA: Diagnosis not present

## 2019-01-14 DIAGNOSIS — I10 Essential (primary) hypertension: Secondary | ICD-10-CM | POA: Insufficient documentation

## 2019-01-14 DIAGNOSIS — R9431 Abnormal electrocardiogram [ECG] [EKG]: Secondary | ICD-10-CM | POA: Diagnosis not present

## 2019-01-14 DIAGNOSIS — Z79899 Other long term (current) drug therapy: Secondary | ICD-10-CM | POA: Insufficient documentation

## 2019-01-14 DIAGNOSIS — E119 Type 2 diabetes mellitus without complications: Secondary | ICD-10-CM | POA: Insufficient documentation

## 2019-01-14 DIAGNOSIS — K219 Gastro-esophageal reflux disease without esophagitis: Secondary | ICD-10-CM | POA: Diagnosis not present

## 2019-01-14 DIAGNOSIS — K2960 Other gastritis without bleeding: Secondary | ICD-10-CM | POA: Diagnosis not present

## 2019-01-14 DIAGNOSIS — R1310 Dysphagia, unspecified: Secondary | ICD-10-CM | POA: Diagnosis not present

## 2019-01-14 DIAGNOSIS — J029 Acute pharyngitis, unspecified: Secondary | ICD-10-CM | POA: Diagnosis not present

## 2019-01-14 LAB — COMPREHENSIVE METABOLIC PANEL
ALT: 34 U/L (ref 0–44)
AST: 16 U/L (ref 15–41)
Albumin: 4.6 g/dL (ref 3.5–5.0)
Alkaline Phosphatase: 94 U/L (ref 38–126)
Anion gap: 10 (ref 5–15)
BUN: 10 mg/dL (ref 6–20)
CO2: 23 mmol/L (ref 22–32)
Calcium: 9.7 mg/dL (ref 8.9–10.3)
Chloride: 101 mmol/L (ref 98–111)
Creatinine, Ser: 0.61 mg/dL (ref 0.44–1.00)
GFR calc Af Amer: >60 mL/min (ref >60)
GFR calc non Af Amer: >60 mL/min (ref >60)
Glucose, Bld: 228 mg/dL — ABNORMAL HIGH (ref 70–99)
Potassium: 3.8 mmol/L (ref 3.5–5.1)
Sodium: 134 mmol/L — ABNORMAL LOW (ref 135–145)
Total Bilirubin: 0.5 mg/dL (ref 0.3–1.2)
Total Protein: 8.8 g/dL — ABNORMAL HIGH (ref 6.5–8.1)

## 2019-01-14 LAB — LIPASE, BLOOD: Lipase: 43 U/L (ref 11–51)

## 2019-01-14 LAB — CBC WITH DIFFERENTIAL/PLATELET
Abs Immature Granulocytes: 0.03 10*3/uL (ref 0.00–0.07)
Basophils Absolute: 0 10*3/uL (ref 0.0–0.1)
Basophils Relative: 0 %
Eosinophils Absolute: 0.2 10*3/uL (ref 0.0–0.5)
Eosinophils Relative: 2 %
HCT: 39.3 % (ref 36.0–46.0)
Hemoglobin: 12.7 g/dL (ref 12.0–15.0)
Immature Granulocytes: 0 %
Lymphocytes Relative: 30 %
Lymphs Abs: 2.8 10*3/uL (ref 0.7–4.0)
MCH: 27.7 pg (ref 26.0–34.0)
MCHC: 32.3 g/dL (ref 30.0–36.0)
MCV: 85.6 fL (ref 80.0–100.0)
Monocytes Absolute: 0.9 10*3/uL (ref 0.1–1.0)
Monocytes Relative: 9 %
Neutro Abs: 5.5 10*3/uL (ref 1.7–7.7)
Neutrophils Relative %: 59 %
Platelets: 122 10*3/uL — ABNORMAL LOW (ref 150–400)
RBC: 4.59 MIL/uL (ref 3.87–5.11)
RDW: 14.2 % (ref 11.5–15.5)
WBC: 9.3 10*3/uL (ref 4.0–10.5)
nRBC: 0 % (ref 0.0–0.2)

## 2019-01-14 LAB — URINALYSIS, ROUTINE W REFLEX MICROSCOPIC
Bilirubin Urine: NEGATIVE
Glucose, UA: 100 mg/dL — AB
Hgb urine dipstick: NEGATIVE
Ketones, ur: NEGATIVE mg/dL
Leukocytes,Ua: NEGATIVE
Nitrite: NEGATIVE
Protein, ur: NEGATIVE mg/dL
Specific Gravity, Urine: 1.015 (ref 1.005–1.030)
pH: 7 (ref 5.0–8.0)

## 2019-01-14 LAB — TROPONIN I: Troponin I: <0.03 ng/mL (ref <0.03)

## 2019-01-14 LAB — PREGNANCY, URINE: Preg Test, Ur: NEGATIVE

## 2019-01-14 LAB — GROUP A STREP BY PCR: Group A Strep by PCR: NOT DETECTED

## 2019-01-14 MED ORDER — FAMOTIDINE 20 MG PO TABS
20.0000 mg | ORAL_TABLET | Freq: Once | ORAL | Status: AC
  Administered 2019-01-14: 20 mg via ORAL
  Filled 2019-01-14: qty 1

## 2019-01-14 MED ORDER — ALUM & MAG HYDROXIDE-SIMETH 200-200-20 MG/5ML PO SUSP
30.0000 mL | Freq: Once | ORAL | Status: AC
  Administered 2019-01-14: 30 mL via ORAL
  Filled 2019-01-14: qty 30

## 2019-01-14 MED ORDER — LIDOCAINE VISCOUS HCL 2 % MT SOLN
15.0000 mL | Freq: Once | OROMUCOSAL | Status: AC
  Administered 2019-01-14: 21:00:00 15 mL via ORAL
  Filled 2019-01-14: qty 15

## 2019-01-14 MED ORDER — ESOMEPRAZOLE MAGNESIUM 40 MG PO CPDR: 40.0000 mg | DELAYED_RELEASE_CAPSULE | Freq: Every day | ORAL | 0 refills | Status: DC

## 2019-01-14 MED ORDER — FAMOTIDINE 20 MG PO TABS: 20.0000 mg | ORAL_TABLET | Freq: Two times a day (BID) | ORAL | 0 refills | Status: DC | PRN

## 2019-01-14 MED ORDER — SODIUM CHLORIDE 0.9 % IV BOLUS
1000.0000 mL | Freq: Once | INTRAVENOUS | Status: AC
  Administered 2019-01-14: 1000 mL via INTRAVENOUS

## 2019-01-14 NOTE — ED Provider Notes (Signed)
[facility] EMERGENCY DEPARTMENT Provider Note   CSN: [id number] Arrival date & time: 01/14/19  1934    History   Chief Complaint Chief Complaint  Patient presents with  . Sore Throat    HPI Kellie Shaffer is a 28 y.o. female history of diabetes, hypertension, here presenting with sore throat, trouble swallowing.  Patient states that she has a history of reflux and she does eat a lot of spicy food.  In the last 3 days or so, she feels that it is hard for her to eat.  She has a burning sensation in the back of her throat.  She denies any food stuck in her throat but states that she has severe pain when she swallows.  Denies any fevers or chills or trouble breathing.  She states that she has history of reflux but not currently on any medicines for it.      The history is provided by the patient.    Past Medical History:  Diagnosis Date  . Abnormal Pap smear of cervix 2019   LSIL  . Abortion May 2013  . Anemia   . Diabetes mellitus without complication (HCC)   . History of ITP   . History of palpitations    Cardiac event monitor June 2019: Normal.  Sinus rhythm no arrhythmias or significant PACs/PVCs.  Marland Kitchen. Hypercholesteremia   . Hypertension   . Hyperthyroidism   . Iron deficiency anemia 09/04/2016  . Peripheral vertigo     Patient Active Problem List   Diagnosis Date Noted  . Type 2 diabetes mellitus without complication, without long-term current use of insulin (HCC) 11/09/2018  . Hypertension 11/09/2018  . Anxiety 11/09/2018  . 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 CIN II (cervical intraepithelial neoplasia II) 07/28/2018  . 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 Palpitations 01/12/2018  . Elevated troponin 01/12/2018  . DKA (diabetic ketoacidoses) (HCC) 08/27/2017  . Hyperglycemia 08/13/2017  . Iron deficiency anemia 09/04/2016  . Hyperthyroidism 07/05/2014  . Thrombocytopenia (HCC)  05/27/2014  . Hypercholesteremia     Past Surgical History:  Procedure Laterality Date  . LAPAROSCOPIC APPENDECTOMY N/A 01/14/2017   Procedure: APPENDECTOMY LAPAROSCOPIC;  Surgeon: Claud KelpIngram, Haywood, MD;  Location: [facility];  Service: General;  Laterality: N/A;     OB History    Gravida  1   Para      Term      Preterm      AB  1   Living        SAB      TAB  1   Ectopic      Multiple      Live Births               Home Medications    Prior to Admission medications   Medication Sig Start Date End Date Taking? Authorizing Provider  Acetaminophen (TYLENOL PO) Take by mouth.    [provider]  atorvastatin (LIPITOR) 40 MG tablet Take 1 tablet (40 mg total) by mouth daily. 10/12/18   Kallie LocksStroud, Natalie M, FNP  Biotin 4098110000 MCG TABS Take 1,000 mcg by mouth daily.     [provider]  busPIRone (BUSPAR) 10 MG tablet Take 1 tablet (10 mg total) by mouth daily. 10/12/18   Kallie LocksStroud, Natalie M, FNP  cetirizine (ZYRTEC) 10 MG tablet Take 1 tablet (10 mg total) by mouth daily as needed for allergies. 09/10/17   Bing NeighborsHarris, Kimberly S, FNP  IRON PO Take 1 tablet by mouth daily.     [provider]  lisinopril (PRINIVIL,ZESTRIL) 5 MG tablet Take 1 tablet (5 mg total) by mouth daily. 10/12/18   [name], FNP  meclizine (ANTIVERT) 25 MG tablet Take 1 tablet (25 mg total) by mouth 3 (three) times daily as needed for dizziness. 12/14/18   [name], FNP  methimazole (TAPAZOLE) 10 MG tablet Take 1 tablet (10 mg total) by mouth daily. 09/15/18   Romero Belling, MD  metoprolol tartrate (LOPRESSOR) 25 MG tablet Take 0.5 tablets (12.5 mg total) by mouth every morning. 10/12/18   [name], FNP  Multiple Vitamins-Minerals (MULTIVITAMIN ADULT PO) Take by mouth daily.    [provider]  norethindrone (MICRONOR) 0.35 MG tablet Take 1 tablet (0.35 mg total) by mouth daily. 01/12/19   Jerene Bears, MD    Family History Family History  Problem  Relation Age of Onset  . Diabetes Other        Parent  . Hypertension Other        Parent  . Hyperlipidemia Other        Parent  . Arthritis Other        Parent  . Breast cancer Mother 28    Social History Social History   Tobacco Use  . Smoking status: Current Every Day Smoker    Packs/day: 0.25    Types: Cigarettes  . Smokeless tobacco: Never Used  Substance Use Topics  . Alcohol use: Not Currently  . Drug use: No     Allergies   Benadryl [diphenhydramine hcl] and Chloroxine   Review of Systems Review of Systems  HENT: Positive for sore throat.   All other systems reviewed and are negative.    Physical Exam Updated Vital Signs BP 134/82 (BP Location: Left Arm)   Pulse 89   Temp 98.9 F (37.2 C) (Oral)   Resp 18   Ht 5\' 9"  (1.753 m)   Wt 105.2 kg   LMP 12/25/2018   SpO2 100%   BMI 34.26 kg/m   Physical Exam Vitals signs and nursing note reviewed.  Constitutional:      Appearance: She is well-developed.  HENT:     Head: Normocephalic.     Mouth/Throat:     Comments: Posterior pharynx with red patchy areas, no tonsillar exudates  Neck:     Musculoskeletal: Normal range of motion.  Cardiovascular:     Rate and Rhythm: Normal rate and regular rhythm.  Pulmonary:     Effort: Pulmonary effort is normal.     Breath sounds: Normal breath sounds.  Abdominal:     General: Bowel sounds are normal.     Palpations: Abdomen is soft.  Skin:    General: Skin is warm.     Capillary Refill: Capillary refill takes less than 2 seconds.  Neurological:     General: No focal deficit present.     Mental Status: She is alert and oriented to person, place, and time.  Psychiatric:        Mood and Affect: Mood normal.        Behavior: Behavior normal.      ED Treatments / Results  Labs (all labs ordered are listed, but only abnormal results are displayed) Labs Reviewed  CBC WITH DIFFERENTIAL/PLATELET - Abnormal; Notable for the following components:       Result Value   Platelets 122 (*)    All other components within normal limits  COMPREHENSIVE METABOLIC PANEL - Abnormal; Notable for the following components:   Sodium 134 (*)    Glucose, Bld 228 (*)    Total Protein 8.8 (*)    All other components within normal limits  URINALYSIS, ROUTINE W REFLEX MICROSCOPIC - Abnormal; Notable for the following components:   Glucose, UA 100 (*)    All other components within normal limits  GROUP A STREP BY PCR  LIPASE, BLOOD  TROPONIN I  PREGNANCY, URINE    EKG EKG Interpretation  Date/Time:  Saturday Jan 14 2019 19:44:19 EDT Ventricular Rate:  85 PR Interval:    QRS Duration: 99 QT Interval:  374 QTC Calculation: 445 R Axis:   42 Text Interpretation:  Sinus rhythm Borderline T abnormalities, anterior leads Baseline wander in lead(s) II III aVF V3 V6 No significant change since last tracing Confirmed by Richardean Canal [phone]) on 01/14/2019 7:47:38 PM   Radiology Dg Chest 2 View  Result Date: 01/14/2019 CLINICAL DATA:  Esophageal burning.  Difficulty swallowing. EXAM: CHEST - 2 VIEW COMPARISON:  May 18, 2016 FINDINGS: The heart size and mediastinal contours are within normal limits. Both lungs are clear. The visualized skeletal structures are unremarkable. IMPRESSION: No active cardiopulmonary disease. Electronically Signed   By: Gerome Sam III M.D   On: 01/14/2019 20:26    Procedures Procedures (including critical care time)  Medications Ordered in ED Medications  sodium chloride 0.9 % bolus 1,000 mL (1,000 mLs Intravenous New Bag/Given 01/14/19 2104)  alum & mag hydroxide-simeth (MAALOX/MYLANTA) 200-200-20 MG/5ML suspension 30 mL (30 mLs Oral Given 01/14/19 2059)    And  lidocaine (XYLOCAINE) 2 % viscous mouth solution 15 mL (15 mLs Oral Given 01/14/19 2059)  famotidine (PEPCID) tablet 20 mg (20 mg Oral Given 01/14/19 2059)     Initial Impression / Assessment and Plan / ED Course  I have reviewed the triage vital signs and the  nursing notes.  Pertinent labs & imaging results that were available during my care of the patient were reviewed by me and considered in my medical decision making (see chart for details).       Caliope CHERE BABSON is a 28 y.o. female here with sore throat. Likely reflux. Given that her posterior pharynx is patchy and red, will swab for strep. Will give GI cocktail and pepcid and reassess.   9:53 PM Rapid strep negative, labs are unremarkable. CXR clear. Likely reflux. Told her to avoid spicy food and take pepcid prn, nexium daily. Will have her follow up with GI.    Final Clinical Impressions(s) / ED Diagnoses   Final diagnoses:  None    ED Discharge Orders    None       Charlynne Pander, MD 01/14/19 2154

## 2019-01-14 NOTE — ED Triage Notes (Signed)
Patient arrived ED c/o sore throat with a burning sensation to mid chest not relieved by antiacid. Patient states no chest pain other then burning. Patient states "it hurts to swallow anything." Patient endorses hx of atrial fibrillation not currently on medications. Patient states hx of acid reflux not currently on medications. Patient is AO x 4, VS WDL.

## 2019-01-14 NOTE — Discharge Instructions (Signed)
Take pepcid twice daily as needed   Take nexium daily   See your doctor. Consider following up with GI if you have persistent symptoms  Avoid spicy food   Return to ER if you have worse trouble swallowing, fever, worse sore throat, dehydration

## 2019-01-15 LAB — CYTOLOGY - PAP
Diagnosis: NEGATIVE
Diagnosis: REACTIVE

## 2019-01-17 ENCOUNTER — Other Ambulatory Visit: Payer: Self-pay

## 2019-01-17 ENCOUNTER — Telehealth: Payer: Self-pay

## 2019-01-17 MED ORDER — MECLIZINE HCL 25 MG PO TABS: 25.0000 mg | ORAL_TABLET | Freq: Three times a day (TID) | ORAL | 0 refills | Status: DC | PRN

## 2019-01-17 NOTE — Telephone Encounter (Signed)
Medication sent to pharmacy  

## 2019-01-19 ENCOUNTER — Other Ambulatory Visit: Payer: Self-pay | Admitting: *Deleted

## 2019-01-19 MED ORDER — FLUCONAZOLE 150 MG PO TABS: 150.0000 mg | ORAL_TABLET | Freq: Once | ORAL | 0 refills | Status: AC

## 2019-02-01 ENCOUNTER — Ambulatory Visit (INDEPENDENT_AMBULATORY_CARE_PROVIDER_SITE_OTHER): Payer: BC Managed Care – PPO | Admitting: Family Medicine

## 2019-02-01 ENCOUNTER — Encounter: Payer: Self-pay | Admitting: Family Medicine

## 2019-02-01 ENCOUNTER — Other Ambulatory Visit: Payer: Self-pay

## 2019-02-01 VITALS — BP 132/78 | HR 88 | Temp 98.1°F | Ht 69.0 in | Wt 224.0 lb

## 2019-02-01 DIAGNOSIS — E119 Type 2 diabetes mellitus without complications: Secondary | ICD-10-CM

## 2019-02-01 DIAGNOSIS — R7309 Other abnormal glucose: Secondary | ICD-10-CM | POA: Diagnosis not present

## 2019-02-01 DIAGNOSIS — K219 Gastro-esophageal reflux disease without esophagitis: Secondary | ICD-10-CM | POA: Diagnosis not present

## 2019-02-01 DIAGNOSIS — F419 Anxiety disorder, unspecified: Secondary | ICD-10-CM

## 2019-02-01 DIAGNOSIS — R42 Dizziness and giddiness: Secondary | ICD-10-CM

## 2019-02-01 DIAGNOSIS — Z09 Encounter for follow-up examination after completed treatment for conditions other than malignant neoplasm: Secondary | ICD-10-CM

## 2019-02-01 LAB — POCT URINALYSIS DIP (MANUAL ENTRY)
Bilirubin, UA: NEGATIVE
Blood, UA: NEGATIVE
Glucose, UA: 500 mg/dL — AB
Leukocytes, UA: NEGATIVE
Nitrite, UA: NEGATIVE
Protein Ur, POC: NEGATIVE mg/dL
Spec Grav, UA: 1.02 (ref 1.010–1.025)
Urobilinogen, UA: 0.2 E.U./dL
pH, UA: 5.5 (ref 5.0–8.0)

## 2019-02-01 LAB — POCT GLYCOSYLATED HEMOGLOBIN (HGB A1C): Hemoglobin A1C: 10.1 % — AB (ref 4.0–5.6)

## 2019-02-01 LAB — GLUCOSE, POCT (MANUAL RESULT ENTRY)
POC Glucose: 310 mg/dl — AB (ref 70–99)
POC Glucose: 328 mg/dl — AB (ref 70–99)

## 2019-02-01 MED ORDER — INSULIN LISPRO 100 UNIT/ML ~~LOC~~ SOLN
10.0000 [IU] | Freq: Once | SUBCUTANEOUS | Status: AC
  Administered 2019-02-01: 10 [IU] via SUBCUTANEOUS

## 2019-02-01 MED ORDER — GLIPIZIDE 10 MG PO TABS: 10.0000 mg | ORAL_TABLET | Freq: Two times a day (BID) | ORAL | 3 refills | Status: DC

## 2019-02-01 MED ORDER — ESOMEPRAZOLE MAGNESIUM 40 MG PO CPDR: 40.0000 mg | DELAYED_RELEASE_CAPSULE | Freq: Every day | ORAL | 3 refills | Status: DC

## 2019-02-01 MED ORDER — MECLIZINE HCL 25 MG PO TABS: 25.0000 mg | ORAL_TABLET | Freq: Three times a day (TID) | ORAL | 3 refills | Status: DC | PRN

## 2019-02-01 MED ORDER — METFORMIN HCL 1000 MG PO TABS: 1000.0000 mg | ORAL_TABLET | Freq: Two times a day (BID) | ORAL | 3 refills | Status: DC

## 2019-02-01 MED ORDER — FAMOTIDINE 20 MG PO TABS: 20.0000 mg | ORAL_TABLET | Freq: Two times a day (BID) | ORAL | 3 refills | Status: DC | PRN

## 2019-02-01 NOTE — Progress Notes (Signed)
Patient [facility] Follow Up  Subjective:  Patient ID: Kellie Shaffer, female    DOB: 1991/04/23  Age: 28 y.o. MRN: [id number]  CC:  Chief Complaint  Patient presents with   Follow-up    chronic condtion    Hospitalization Follow-up    HPI Kellie Shaffer is a 28 year old female who presents for follow up today.   Past Medical History:  Diagnosis Date   Abnormal Pap smear of cervix 2019   LSIL   Abortion May 2013   Anemia    Anxiety    Diabetes mellitus without complication ([place])    History of ITP    History of palpitations    Cardiac event monitor June 2019: Normal.  Sinus rhythm no arrhythmias or significant PACs/PVCs.   Hypercholesteremia    Hypertension    Hyperthyroidism    Iron deficiency anemia 09/04/2016   Peripheral vertigo    Current Status: Since her last office visit, she has had an ED visit on 01/14/2019 for acid reflux. Today, she is doing well with no complaints. She denies visual changes, chest pain, cough, shortness of breath, heart palpitations, and falls. She has occasional headaches and dizziness with position changes. Denies severe headaches, confusion, seizures, double vision, and blurred vision, nausea and vomiting. Her most recent normal range of preprandial blood glucose levels have been between 180-190. She has seen low range of 305 and high of unreadable since his last office visit. She admits to fatigue, frequent urination, and blurred vision. She denies excessive hunger, excessive thirst, weight gain, weight loss, and poor wound healing. She continues to check her feet regularly. Her anxiety is moderate. She denies suicidal ideations, homicidal ideations, or auditory hallucinations.   She denies fevers, chills, recent infections, weight loss, and night sweats. No reports of GI problems such as diarrhea, and constipation. She has no reports of blood in stools, dysuria and  hematuria.   Past Surgical History:  Procedure Laterality Date   LAPAROSCOPIC APPENDECTOMY N/A 01/14/2017   Procedure: APPENDECTOMY LAPAROSCOPIC;  Surgeon: Fanny Skates, MD;  Location: [facility];  Service: General;  Laterality: N/A;    Family History  Problem Relation Age of Onset   Diabetes Other        Parent   Hypertension Other        Parent   Hyperlipidemia Other        Parent   Arthritis Other        Parent   Breast cancer Mother 84    Social History   Socioeconomic History   Marital status: Single    Spouse name: Not on file   Number of children: Not on file   Years of education: 14   Highest education level: Not on file  Occupational History   Occupation: [employer] engineer strain: Not on file   Food insecurity    Worry: Not on file    Inability: Not on file   Transportation needs    Medical: Not on file    Non-medical: Not on file  Tobacco Use   Smoking status: Current Every Day Smoker    Packs/day: 0.25    Types: Cigarettes   Smokeless tobacco: Never Used  Substance and Sexual Activity   Alcohol use: Not Currently   Drug use: No   Sexual activity: Yes    Partners: Male    Birth control/protection: Condom, Pill  Lifestyle  Physical activity    Days per week: Not on file    Minutes per session: Not on file   Stress: Not on file  Relationships   Social connections    Talks on phone: Not on file    Gets together: Not on file    Attends religious service: Not on file    Active member of club or organization: Not on file    Attends meetings of clubs or organizations: Not on file    Relationship status: Not on file   Intimate partner violence    Fear of current or ex partner: Not on file    Emotionally abused: Not on file    Physically abused: Not on file    Forced sexual activity: Not on file  Other Topics Concern   Not on file  Social History Narrative   Regular exercise-yes   Caffeine  Use-yes          Outpatient Medications Prior to Visit  Medication Sig Dispense Refill   Acetaminophen (TYLENOL PO) Take by mouth.     atorvastatin (LIPITOR) 40 MG tablet Take 1 tablet (40 mg total) by mouth daily. 90 tablet 3   Biotin 10000 MCG TABS Take 1,000 mcg by mouth daily.      busPIRone (BUSPAR) 10 MG tablet Take 1 tablet (10 mg total) by mouth daily. 90 tablet 2   cetirizine (ZYRTEC) 10 MG tablet Take 1 tablet (10 mg total) by mouth daily as needed for allergies. 90 tablet 1   IRON PO Take 1 tablet by mouth daily.      lisinopril (PRINIVIL,ZESTRIL) 5 MG tablet Take 1 tablet (5 mg total) by mouth daily. 90 tablet 2   methimazole (TAPAZOLE) 10 MG tablet Take 1 tablet (10 mg total) by mouth daily. 90 tablet 3   metoprolol tartrate (LOPRESSOR) 25 MG tablet Take 0.5 tablets (12.5 mg total) by mouth every morning. 90 tablet 2   Multiple Vitamins-Minerals (MULTIVITAMIN ADULT PO) Take by mouth daily.     norethindrone (MICRONOR) 0.35 MG tablet Take 1 tablet (0.35 mg total) by mouth daily. 3 Package 4   esomeprazole (NEXIUM) 40 MG capsule Take 1 capsule (40 mg total) by mouth daily. 30 capsule 0   famotidine (PEPCID) 20 MG tablet Take 1 tablet (20 mg total) by mouth 2 (two) times daily as needed for heartburn or indigestion. 15 tablet 0   meclizine (ANTIVERT) 25 MG tablet Take 1 tablet (25 mg total) by mouth 3 (three) times daily as needed for dizziness. 30 tablet 0   No facility-administered medications prior to visit.     Allergies  Allergen Reactions   Benadryl [Diphenhydramine Hcl] Anaphylaxis and Hives   Chloroxine Hives    Clorox    ROS Review of Systems  Constitutional: Negative.   HENT: Negative.   Eyes: Negative.   Respiratory: Negative.   Cardiovascular: Negative.   Gastrointestinal: Positive for abdominal distention (obese).  Endocrine: Negative.   Genitourinary: Negative.   Musculoskeletal: Negative.   Skin: Negative.   Allergic/Immunologic:  Negative.   Neurological: Positive for dizziness (occasional) and headaches (occasional).  Hematological: Negative.   Psychiatric/Behavioral: Negative.       Objective:    Physical Exam  Constitutional: She is oriented to person, place, and time. She appears well-developed and well-nourished.  HENT:  Head: Normocephalic and atraumatic.  Eyes: Conjunctivae are normal.  Neck: Normal range of motion. Neck supple.  Cardiovascular: Normal rate, regular rhythm, normal heart sounds and intact distal pulses.  Pulmonary/Chest: Effort normal and breath sounds normal.  Abdominal: Soft. Bowel sounds are normal.  Musculoskeletal: Normal range of motion.  Neurological: She is alert and oriented to person, place, and time. She has normal reflexes.  Skin: Skin is warm and dry.  Psychiatric: She has a normal mood and affect. Her behavior is normal. Judgment and thought content normal.  Nursing note and vitals reviewed.   BP 132/78 (BP Location: Right Arm, Patient Position: Sitting, Cuff Size: Large)    Pulse 88    Temp 98.1 F (36.7 C) (Oral)    Ht '5\' 9"'  (1.753 m)    Wt 224 lb (101.6 kg)    LMP 01/22/2019    SpO2 100%    BMI 33.08 kg/m  Wt Readings from Last 3 Encounters:  02/01/19 224 lb (101.6 kg)  01/14/19 232 lb (105.2 kg)  01/12/19 232 lb (105.2 kg)     Health Maintenance Due  Topic Date Due   PNEUMOCOCCAL POLYSACCHARIDE VACCINE AGE 78-64 HIGH RISK  03/10/1992   OPHTHALMOLOGY EXAM  03/10/2000   FOOT EXAM  09/10/2018    There are no preventive care reminders to display for this patient.  Lab Results  Component Value Date   TSH 4.01 12/14/2018   Lab Results  Component Value Date   WBC 9.3 01/14/2019   HGB 12.7 01/14/2019   HCT 39.3 01/14/2019   MCV 85.6 01/14/2019   PLT 122 (L) 01/14/2019   Lab Results  Component Value Date   NA 134 (L) 01/14/2019   K 3.8 01/14/2019   CHLORIDE 95 (L) 08/13/2017   CO2 23 01/14/2019   GLUCOSE 228 (H) 01/14/2019   BUN 10 01/14/2019    CREATININE 0.61 01/14/2019   BILITOT 0.5 01/14/2019   ALKPHOS 94 01/14/2019   AST 16 01/14/2019   ALT 34 01/14/2019   PROT 8.8 (H) 01/14/2019   ALBUMIN 4.6 01/14/2019   CALCIUM 9.7 01/14/2019   ANIONGAP 10 01/14/2019   EGFR >60 08/13/2017   Lab Results  Component Value Date   CHOL 216 (H) 08/31/2017   Lab Results  Component Value Date   HDL 35 (L) 08/31/2017   Lab Results  Component Value Date   LDLCALC 168 (H) 08/31/2017   Lab Results  Component Value Date   TRIG 67 08/31/2017   Lab Results  Component Value Date   CHOLHDL 6.2 08/31/2017   Lab Results  Component Value Date   HGBA1C 10.1 (A) 02/01/2019      Assessment & Plan:   1. Type 2 diabetes mellitus without complication, without long-term current use of insulin (HCC) Worsening. Hgb A1c increased at 10.1 today, from 7.3 on 09/13/2018.  She will continue to decrease foods/beverages high in sugars and carbs and follow Heart Healthy or DASH diet. Increase physical activity to at least 30 minutes cardio exercise daily.  - POCT glycosylated hemoglobin (Hb A1C) - POCT urinalysis dipstick - POCT glucose (manual entry) - metFORMIN (GLUCOPHAGE) 1000 MG tablet; Take 1 tablet (1,000 mg total) by mouth 2 (two) times daily with a meal.  Dispense: 180 tablet; Refill: 3 - glipiZIDE (GLUCOTROL) 10 MG tablet; Take 1 tablet (10 mg total) by mouth 2 (two) times daily before a meal.  Dispense: 60 tablet; Refill: 3  2. Elevated glucose - insulin lispro (HUMALOG) injection 10 Units - metFORMIN (GLUCOPHAGE) 1000 MG tablet; Take 1 tablet (1,000 mg total) by mouth 2 (two) times daily with a meal.  Dispense: 180 tablet; Refill: 3 - glipiZIDE (GLUCOTROL) 10 MG tablet;  Take 1 tablet (10 mg total) by mouth 2 (two) times daily before a meal.  Dispense: 60 tablet; Refill: 3  3. Gastroesophageal reflux disease without esophagitis - esomeprazole (NEXIUM) 40 MG capsule; Take 1 capsule (40 mg total) by mouth daily.  Dispense: 30 capsule;  Refill: 3 - famotidine (PEPCID) 20 MG tablet; Take 1 tablet (20 mg total) by mouth 2 (two) times daily as needed for heartburn or indigestion.  Dispense: 60 tablet; Refill: 3  4. Dizziness - meclizine (ANTIVERT) 25 MG tablet; Take 1 tablet (25 mg total) by mouth 3 (three) times daily as needed for dizziness.  Dispense: 30 tablet; Refill: 3  5. Anxiety Stable today.   6. Follow up She will follow up in 3 months.   Meds ordered this encounter  Medications   insulin lispro (HUMALOG) injection 10 Units   metFORMIN (GLUCOPHAGE) 1000 MG tablet    Sig: Take 1 tablet (1,000 mg total) by mouth 2 (two) times daily with a meal.    Dispense:  180 tablet    Refill:  3   glipiZIDE (GLUCOTROL) 10 MG tablet    Sig: Take 1 tablet (10 mg total) by mouth 2 (two) times daily before a meal.    Dispense:  60 tablet    Refill:  3   esomeprazole (NEXIUM) 40 MG capsule    Sig: Take 1 capsule (40 mg total) by mouth daily.    Dispense:  30 capsule    Refill:  3   famotidine (PEPCID) 20 MG tablet    Sig: Take 1 tablet (20 mg total) by mouth 2 (two) times daily as needed for heartburn or indigestion.    Dispense:  60 tablet    Refill:  3   meclizine (ANTIVERT) 25 MG tablet    Sig: Take 1 tablet (25 mg total) by mouth 3 (three) times daily as needed for dizziness.    Dispense:  30 tablet    Refill:  3   Orders Placed This Encounter  Procedures   POCT glycosylated hemoglobin (Hb A1C)   POCT urinalysis dipstick   POCT glucose (manual entry)    Referral Orders  No referral(s) requested today    Kathe Becton,  MSN, FNP-BC Patient [facility] [place], [place] [phone]     Problem List Items Addressed This Visit      Endocrine   Type 2 diabetes mellitus without complication, without long-term current use of insulin ([place]) - Primary   Relevant Medications   metFORMIN (GLUCOPHAGE) 1000 MG tablet   glipiZIDE (GLUCOTROL) 10 MG  tablet   Other Relevant Orders   POCT glycosylated hemoglobin (Hb A1C) (Completed)   POCT urinalysis dipstick (Completed)   POCT glucose (manual entry) (Completed)     Other   Anxiety    Other Visit Diagnoses    Elevated glucose       Relevant Medications   insulin lispro (HUMALOG) injection 10 Units (Completed)   metFORMIN (GLUCOPHAGE) 1000 MG tablet   glipiZIDE (GLUCOTROL) 10 MG tablet   Gastroesophageal reflux disease without esophagitis       Relevant Medications   esomeprazole (NEXIUM) 40 MG capsule   famotidine (PEPCID) 20 MG tablet   meclizine (ANTIVERT) 25 MG tablet   Dizziness       Relevant Medications   meclizine (ANTIVERT) 25 MG tablet   Follow up          Meds ordered this encounter  Medications  insulin lispro (HUMALOG) injection 10 Units   metFORMIN (GLUCOPHAGE) 1000 MG tablet    Sig: Take 1 tablet (1,000 mg total) by mouth 2 (two) times daily with a meal.    Dispense:  180 tablet    Refill:  3   glipiZIDE (GLUCOTROL) 10 MG tablet    Sig: Take 1 tablet (10 mg total) by mouth 2 (two) times daily before a meal.    Dispense:  60 tablet    Refill:  3   esomeprazole (NEXIUM) 40 MG capsule    Sig: Take 1 capsule (40 mg total) by mouth daily.    Dispense:  30 capsule    Refill:  3   famotidine (PEPCID) 20 MG tablet    Sig: Take 1 tablet (20 mg total) by mouth 2 (two) times daily as needed for heartburn or indigestion.    Dispense:  60 tablet    Refill:  3   meclizine (ANTIVERT) 25 MG tablet    Sig: Take 1 tablet (25 mg total) by mouth 3 (three) times daily as needed for dizziness.    Dispense:  30 tablet    Refill:  3    Follow-up: No follow-ups on file.    Azzie Glatter, FNP

## 2019-02-28 ENCOUNTER — Ambulatory Visit: Payer: Managed Care, Other (non HMO) | Admitting: Family Medicine

## 2019-04-12 ENCOUNTER — Ambulatory Visit: Payer: BLUE CROSS/BLUE SHIELD | Admitting: Endocrinology

## 2019-04-19 ENCOUNTER — Encounter: Payer: Self-pay | Admitting: Endocrinology

## 2019-04-19 ENCOUNTER — Ambulatory Visit (INDEPENDENT_AMBULATORY_CARE_PROVIDER_SITE_OTHER): Payer: BC Managed Care – PPO | Admitting: Endocrinology

## 2019-04-19 ENCOUNTER — Other Ambulatory Visit: Payer: Self-pay

## 2019-04-19 VITALS — BP 140/72 | HR 81 | Ht 69.0 in | Wt 233.2 lb

## 2019-04-19 DIAGNOSIS — E0590 Thyrotoxicosis, unspecified without thyrotoxic crisis or storm: Secondary | ICD-10-CM | POA: Diagnosis not present

## 2019-04-19 DIAGNOSIS — I10 Essential (primary) hypertension: Secondary | ICD-10-CM | POA: Diagnosis not present

## 2019-04-19 LAB — TSH: TSH: 2.56 u[IU]/mL (ref 0.35–4.50)

## 2019-04-19 LAB — T4, FREE: Free T4: 1.49 ng/dL (ref 0.60–1.60)

## 2019-04-19 NOTE — Progress Notes (Signed)
Subjective:    Patient ID: Kellie Shaffer, female    DOB: March 26, 1991, 28 y.o.   MRN: [id number]  HPI Pt returns for f/u of hyperthyroidism (dx'ed 2013, during a pregnancy; US showed a small multinodular goiter, but she also has proptosis; she was then rx'ed with tapazole; she was lost to f/u, but hyperthyroidism was found again during a hospitalization in late 2015 for appendicitis; she was restarted on tapazole then; pt says she is not at risk for another pregnancy; f/u TFT in 2018 were normal off rx; later in 2018, it was resumed due to suppressed TSH).   She says she never misses the methimazole.  pt states she feels well in general.  Specifically, she denies palpitations and tremor.   Past Medical History:  Diagnosis Date  . Abnormal Pap smear of cervix 2019   LSIL  . Abortion May 2013  . Anemia   . Anxiety   . Diabetes mellitus without complication (HCC)   . History of ITP   . History of palpitations    Cardiac event monitor June 2019: Normal.  Sinus rhythm no arrhythmias or significant PACs/PVCs.  Marland Kitchen Hypercholesteremia   . Hypertension   . Hyperthyroidism   . Iron deficiency anemia 09/04/2016  . Peripheral vertigo     Past Surgical History:  Procedure Laterality Date  . LAPAROSCOPIC APPENDECTOMY N/A 01/14/2017   Procedure: APPENDECTOMY LAPAROSCOPIC;  Surgeon: Claud Kelp, MD;  Location: [facility];  Service: General;  Laterality: N/A;    Social History   Socioeconomic History  . Marital status: Single    Spouse name: Not on file  . Number of children: Not on file  . Years of education: 4  . Highest education level: Not on file  Occupational History  . 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 Financial resource strain: Not on file  . Food insecurity    Worry: Not on file    Inability: Not on file  . Transportation needs    Medical: Not on file    Non-medical: Not on file  Tobacco Use  . Smoking status: Current Every Day Smoker    Packs/day: 0.25   Types: Cigarettes  . Smokeless tobacco: Never Used  Substance and Sexual Activity  . Alcohol use: Not Currently  . Drug use: No  . Sexual activity: Yes    Partners: Male    Birth control/protection: Condom, Pill  Lifestyle  . Physical activity    Days per week: Not on file    Minutes per session: Not on file  . Stress: Not on file  Relationships  . Social Musician on phone: Not on file    Gets together: Not on file    Attends religious service: Not on file    Active member of club or organization: Not on file    Attends meetings of clubs or organizations: Not on file    Relationship status: Not on file  . Intimate partner violence    Fear of current or ex partner: Not on file    Emotionally abused: Not on file    Physically abused: Not on file    Forced sexual activity: Not on file  Other Topics Concern  . Not on file  Social History Narrative   Regular exercise-yes   Caffeine Use-yes          Current Outpatient Medications on File Prior to Visit  Medication Sig Dispense Refill  . Acetaminophen (TYLENOL PO) Take by mouth.    Marland Kitchen  atorvastatin (LIPITOR) 40 MG tablet Take 1 tablet (40 mg total) by mouth daily. 90 tablet 3  . Biotin 1610910000 MCG TABS Take 1,000 mcg by mouth daily.     . busPIRone (BUSPAR) 10 MG tablet Take 1 tablet (10 mg total) by mouth daily. 90 tablet 2  . cetirizine (ZYRTEC) 10 MG tablet Take 1 tablet (10 mg total) by mouth daily as needed for allergies. 90 tablet 1  . esomeprazole (NEXIUM) 40 MG capsule Take 1 capsule (40 mg total) by mouth daily. 30 capsule 3  . famotidine (PEPCID) 20 MG tablet Take 1 tablet (20 mg total) by mouth 2 (two) times daily as needed for heartburn or indigestion. 60 tablet 3  . glipiZIDE (GLUCOTROL) 10 MG tablet Take 1 tablet (10 mg total) by mouth 2 (two) times daily before a meal. 60 tablet 3  . IRON PO Take 1 tablet by mouth daily.     Marland Kitchen. lisinopril (PRINIVIL,ZESTRIL) 5 MG tablet Take 1 tablet (5 mg total) by mouth  daily. 90 tablet 2  . meclizine (ANTIVERT) 25 MG tablet Take 1 tablet (25 mg total) by mouth 3 (three) times daily as needed for dizziness. 30 tablet 3  . metFORMIN (GLUCOPHAGE) 1000 MG tablet Take 1 tablet (1,000 mg total) by mouth 2 (two) times daily with a meal. 180 tablet 3  . methimazole (TAPAZOLE) 10 MG tablet Take 1 tablet (10 mg total) by mouth daily. 90 tablet 3  . metoprolol tartrate (LOPRESSOR) 25 MG tablet Take 0.5 tablets (12.5 mg total) by mouth every morning. 90 tablet 2  . Multiple Vitamins-Minerals (MULTIVITAMIN ADULT PO) Take by mouth daily.    . norethindrone (MICRONOR) 0.35 MG tablet Take 1 tablet (0.35 mg total) by mouth daily. 3 Package 4   No current facility-administered medications on file prior to visit.     Allergies  Allergen Reactions  . Benadryl [Diphenhydramine Hcl] Anaphylaxis and Hives  . Chloroxine Hives    Clorox    Family History  Problem Relation Age of Onset  . Diabetes Other        Parent  . Hypertension Other        Parent  . Hyperlipidemia Other        Parent  . Arthritis Other        Parent  . Breast cancer Mother 45    BP 140/72 (BP Location: Left Arm, Patient Position: Sitting, Cuff Size: Large)   Pulse 81   Ht 5\' 9"  (1.753 m)   Wt 233 lb 3.2 oz (105.8 kg)   SpO2 96%   BMI 34.44 kg/m    Review of Systems Denies fever    Objective:   Physical Exam VITAL SIGNS:  See vs page GENERAL: no distress NECK: Thyroid is slightly and diffusely enlarged.  No thyroid nodule is palpable.  No palpable lymphadenopathy at the anterior neck.  Lab Results  Component Value Date   TSH 2.56 04/19/2019      Assessment & Plan:  HTN: is noted today Hyperthyroidism: well-controlled.  Please continue the same medication  Patient Instructions  Your blood pressure is high today.  Please see your primary care provider soon, to have it rechecked Blood tests are requested for you today.  We'll let you know about the results.  It is best to never  miss your methimazole. However, if you do miss it, next best is to take 2 the next day.  Please come back for a follow-up appointment in 4-6 months.  In  view of your medical condition, you should avoid pregnancy until we have decided it is safe.   If ever you have fever while taking methimazole, stop it and call us, even if the reason is obvious, because of the risk of a rare side-effect.

## 2019-04-19 NOTE — Patient Instructions (Addendum)
Your blood pressure is high today.  Please see your primary care provider soon, to have it rechecked Blood tests are requested for you today.  We'll let you know about the results.  It is best to never miss your methimazole. However, if you do miss it, next best is to take 2 the next day.  Please come back for a follow-up appointment in 4-6 months.  In view of your medical condition, you should avoid pregnancy until we have decided it is safe.   If ever you have fever while taking methimazole, stop it and call us, even if the reason is obvious, because of the risk of a rare side-effect.

## 2019-05-02 ENCOUNTER — Telehealth: Payer: Self-pay | Admitting: Internal Medicine

## 2019-05-02 NOTE — Telephone Encounter (Signed)
Patient called regarding October appointments, per patient's request appointment has been rescheduled to 10/14.

## 2019-05-03 ENCOUNTER — Ambulatory Visit: Payer: BC Managed Care – PPO | Admitting: Family Medicine

## 2019-05-29 ENCOUNTER — Encounter: Payer: Self-pay | Admitting: Family Medicine

## 2019-05-29 ENCOUNTER — Other Ambulatory Visit: Payer: Self-pay

## 2019-05-29 ENCOUNTER — Ambulatory Visit: Payer: BLUE CROSS/BLUE SHIELD | Admitting: Internal Medicine

## 2019-05-29 ENCOUNTER — Other Ambulatory Visit: Payer: Self-pay | Admitting: Family Medicine

## 2019-05-29 ENCOUNTER — Ambulatory Visit (INDEPENDENT_AMBULATORY_CARE_PROVIDER_SITE_OTHER): Payer: BC Managed Care – PPO | Admitting: Family Medicine

## 2019-05-29 ENCOUNTER — Other Ambulatory Visit: Payer: BLUE CROSS/BLUE SHIELD

## 2019-05-29 VITALS — BP 113/70 | HR 73 | Temp 97.4°F | Ht 69.0 in | Wt 232.4 lb

## 2019-05-29 DIAGNOSIS — E119 Type 2 diabetes mellitus without complications: Secondary | ICD-10-CM

## 2019-05-29 DIAGNOSIS — F419 Anxiety disorder, unspecified: Secondary | ICD-10-CM | POA: Diagnosis not present

## 2019-05-29 DIAGNOSIS — R197 Diarrhea, unspecified: Secondary | ICD-10-CM

## 2019-05-29 DIAGNOSIS — R42 Dizziness and giddiness: Secondary | ICD-10-CM | POA: Insufficient documentation

## 2019-05-29 DIAGNOSIS — Z09 Encounter for follow-up examination after completed treatment for conditions other than malignant neoplasm: Secondary | ICD-10-CM

## 2019-05-29 DIAGNOSIS — R7309 Other abnormal glucose: Secondary | ICD-10-CM

## 2019-05-29 DIAGNOSIS — K219 Gastro-esophageal reflux disease without esophagitis: Secondary | ICD-10-CM

## 2019-05-29 DIAGNOSIS — I10 Essential (primary) hypertension: Secondary | ICD-10-CM

## 2019-05-29 LAB — POCT URINALYSIS DIPSTICK
Blood, UA: NEGATIVE
Glucose, UA: NEGATIVE
Nitrite, UA: NEGATIVE
Protein, UA: POSITIVE — AB
Spec Grav, UA: >=1.03 — AB (ref 1.010–1.025)
Urobilinogen, UA: 2 E.U./dL — AB
pH, UA: 6 (ref 5.0–8.0)

## 2019-05-29 LAB — GLUCOSE, POCT (MANUAL RESULT ENTRY): POC Glucose: 93 mg/dl (ref 70–99)

## 2019-05-29 LAB — POCT GLYCOSYLATED HEMOGLOBIN (HGB A1C): Hemoglobin A1C: 6 % — AB (ref 4.0–5.6)

## 2019-05-29 MED ORDER — METFORMIN HCL 500 MG PO TABS: 500.0000 mg | ORAL_TABLET | Freq: Two times a day (BID) | ORAL | 3 refills | Status: DC

## 2019-05-29 NOTE — Progress Notes (Signed)
[facility]   Established Patient Office Visit  Subjective:  Patient ID: Kellie Shaffer, female    DOB: 26-Aug-1990  Age: 28 y.o. MRN: [id number]  CC:  Chief Complaint  Patient presents with   Follow-up    3 mth f/u DM, HTN    HPI Kellie Shaffer is a 28 year old female who presents for Follow Up today.   Past Medical History:  Diagnosis Date   Abnormal Pap smear of cervix 2019   LSIL   Abortion May 2013   Anemia    Anxiety    Diabetes mellitus without complication ([place])    History of ITP    History of palpitations    Cardiac event monitor June 2019: Normal.  Sinus rhythm no arrhythmias or significant PACs/PVCs.   Hypercholesteremia    Hypertension    Hyperthyroidism    Iron deficiency anemia 09/04/2016   Peripheral vertigo    Current Status: Since her last office visit, she is doing well with no complaints. She has not been monitoring her blood glucose levels. She denies fatigue, frequent urination, blurred vision, excessive hunger, excessive thirst, weight gain, weight loss, and poor wound healing. She continues to check her feet regularly. She has been eating healthier and taking her medications as prescribed. She has also been doing [employer], which she enjoys. She is experiencing increasing episodes of diarrhea and requires multiple breaks while at work. She denies visual changes, chest pain, cough, shortness of breath, heart palpitations, and falls. She has occasional headaches and dizziness with position changes. Denies severe headaches, confusion, seizures, double vision, and blurred vision, nausea and vomiting. Her anxiety is mild today. She denies suicidal ideations, homicidal ideations, or auditory hallucinations. She denies fevers, chills, recent infections, weight loss, and night sweats. No reports of GI problems such as constipation. She has no reports of blood in stools, dysuria and hematuria.  She denies pain today.   Past Surgical History:  Procedure Laterality Date   LAPAROSCOPIC APPENDECTOMY N/A 01/14/2017   Procedure: APPENDECTOMY LAPAROSCOPIC;  Surgeon: Fanny Skates, MD;  Location: [facility];  Service: General;  Laterality: N/A;    Family History  Problem Relation Age of Onset   Diabetes Other        Parent   Hypertension Other        Parent   Hyperlipidemia Other        Parent   Arthritis Other        Parent   Breast cancer Mother 75    Social History   Socioeconomic History   Marital status: Single    Spouse name: Not on file   Number of children: Not on file   Years of education: 14   Highest education level: Not on file  Occupational History   Occupation: [employer] engineer strain: Not on file   Food insecurity    Worry: Not on file    Inability: Not on file   Transportation needs    Medical: Not on file    Non-medical: Not on file  Tobacco Use   Smoking status: Current Every Day Smoker    Packs/day: 0.25    Types: Cigarettes   Smokeless tobacco: Never Used  Substance and Sexual Activity   Alcohol use: Not Currently   Drug use: No   Sexual activity: Yes    Partners: Male    Birth control/protection: Condom, Pill  Lifestyle  Physical activity    Days per week: Not on file    Minutes per session: Not on file   Stress: Not on file  Relationships   Social connections    Talks on phone: Not on file    Gets together: Not on file    Attends religious service: Not on file    Active member of club or organization: Not on file    Attends meetings of clubs or organizations: Not on file    Relationship status: Not on file   Intimate partner violence    Fear of current or ex partner: Not on file    Emotionally abused: Not on file    Physically abused: Not on file    Forced sexual activity: Not on file  Other Topics Concern   Not on file  Social History Narrative   Regular exercise-yes     Caffeine Use-yes          Outpatient Medications Prior to Visit  Medication Sig Dispense Refill   Acetaminophen (TYLENOL PO) Take by mouth.     atorvastatin (LIPITOR) 40 MG tablet Take 1 tablet (40 mg total) by mouth daily. 90 tablet 3   Biotin 10000 MCG TABS Take 1,000 mcg by mouth daily.      busPIRone (BUSPAR) 10 MG tablet Take 1 tablet (10 mg total) by mouth daily. 90 tablet 2   cetirizine (ZYRTEC) 10 MG tablet Take 1 tablet (10 mg total) by mouth daily as needed for allergies. 90 tablet 1   esomeprazole (NEXIUM) 40 MG capsule Take 1 capsule (40 mg total) by mouth daily. 30 capsule 3   famotidine (PEPCID) 20 MG tablet TAKE 1 TABLET BY MOUTH TWICE DAILY AS NEEDED FOR HEARTBURN/INDIGESTION 60 tablet 3   glipiZIDE (GLUCOTROL) 10 MG tablet TAKE 1 TABLET(10 MG) BY MOUTH TWICE DAILY BEFORE A MEAL 60 tablet 3   IRON PO Take 1 tablet by mouth daily.      lisinopril (PRINIVIL,ZESTRIL) 5 MG tablet Take 1 tablet (5 mg total) by mouth daily. 90 tablet 2   meclizine (ANTIVERT) 25 MG tablet Take 1 tablet (25 mg total) by mouth 3 (three) times daily as needed for dizziness. 30 tablet 3   methimazole (TAPAZOLE) 10 MG tablet Take 1 tablet (10 mg total) by mouth daily. 90 tablet 3   metoprolol tartrate (LOPRESSOR) 25 MG tablet Take 0.5 tablets (12.5 mg total) by mouth every morning. 90 tablet 2   Multiple Vitamins-Minerals (MULTIVITAMIN ADULT PO) Take by mouth daily.     norethindrone (MICRONOR) 0.35 MG tablet Take 1 tablet (0.35 mg total) by mouth daily. 3 Package 4   metFORMIN (GLUCOPHAGE) 1000 MG tablet Take 1 tablet (1,000 mg total) by mouth 2 (two) times daily with a meal. 180 tablet 3   No facility-administered medications prior to visit.     Allergies  Allergen Reactions   Benadryl [Diphenhydramine Hcl] Anaphylaxis and Hives   Chloroxine Hives    Clorox    ROS Review of Systems  Constitutional: Positive for fatigue.  HENT: Negative.   Eyes: Negative.    Respiratory: Negative.   Cardiovascular: Negative.   Gastrointestinal: Negative.   Genitourinary: Negative.   Musculoskeletal: Negative.   Skin: Negative.   Allergic/Immunologic: Negative.   Neurological: Negative.   Hematological: Negative.   Psychiatric/Behavioral: Negative.       Objective:    Physical Exam  Constitutional: She is oriented to person, place, and time. She appears well-developed and well-nourished.  HENT:  Head: Normocephalic  and atraumatic.  Eyes: Conjunctivae are normal.  Neck: Normal range of motion. Neck supple.  Cardiovascular: Normal rate, regular rhythm, normal heart sounds and intact distal pulses.  Pulmonary/Chest: Effort normal and breath sounds normal.  Abdominal: Soft. Bowel sounds are normal.  Musculoskeletal: Normal range of motion.  Neurological: She is alert and oriented to person, place, and time. She has normal reflexes.  Skin: Skin is warm and dry.  Psychiatric: She has a normal mood and affect. Her behavior is normal. Judgment and thought content normal.  Nursing note and vitals reviewed.   BP 113/70 (BP Location: Left Arm, Patient Position: Sitting, Cuff Size: Large)    Pulse 73    Temp (!) 97.4 F (36.3 C) (Oral)    Ht '5\' 9"'  (1.753 m)    Wt 232 lb 6.4 oz (105.4 kg)    LMP 05/15/2019    SpO2 99%    BMI 34.32 kg/m  Wt Readings from Last 3 Encounters:  05/29/19 232 lb 6.4 oz (105.4 kg)  04/19/19 233 lb 3.2 oz (105.8 kg)  02/01/19 224 lb (101.6 kg)     Health Maintenance Due  Topic Date Due   PNEUMOCOCCAL POLYSACCHARIDE VACCINE AGE 26-64 HIGH RISK  03/10/1992   OPHTHALMOLOGY EXAM  03/10/2000   FOOT EXAM  09/10/2018   INFLUENZA VACCINE  03/18/2019    There are no preventive care reminders to display for this patient.  Lab Results  Component Value Date   TSH 2.56 04/19/2019   Lab Results  Component Value Date   WBC 9.3 01/14/2019   HGB 12.7 01/14/2019   HCT 39.3 01/14/2019   MCV 85.6 01/14/2019   PLT 122 (L)  01/14/2019   Lab Results  Component Value Date   NA 134 (L) 01/14/2019   K 3.8 01/14/2019   CHLORIDE 95 (L) 08/13/2017   CO2 23 01/14/2019   GLUCOSE 228 (H) 01/14/2019   BUN 10 01/14/2019   CREATININE 0.61 01/14/2019   BILITOT 0.5 01/14/2019   ALKPHOS 94 01/14/2019   AST 16 01/14/2019   ALT 34 01/14/2019   PROT 8.8 (H) 01/14/2019   ALBUMIN 4.6 01/14/2019   CALCIUM 9.7 01/14/2019   ANIONGAP 10 01/14/2019   EGFR >60 08/13/2017   Lab Results  Component Value Date   CHOL 216 (H) 08/31/2017   Lab Results  Component Value Date   HDL 35 (L) 08/31/2017   Lab Results  Component Value Date   LDLCALC 168 (H) 08/31/2017   Lab Results  Component Value Date   TRIG 67 08/31/2017   Lab Results  Component Value Date   CHOLHDL 6.2 08/31/2017   Lab Results  Component Value Date   HGBA1C 6.0 (A) 05/29/2019      Assessment & Plan:   1. Type 2 diabetes mellitus without complication, without long-term current use of insulin (HCC) The current medical regimen is effective; Hgb A1c is stable at 6.0 today, from 10.1 on 02/01/2019 continue present plan and medications as prescribed. We will decrease Metformin to 500 mg BID. Sje will continue medication as prescribed, to decrease foods/beverages high in sugars and carbs and follow Heart Healthy or DASH diet. Increase physical activity to at least 30 minutes cardio exercise daily.  - POCT urinalysis dipstick - POCT glycosylated hemoglobin (Hb A1C) - POCT glucose (manual entry) - metFORMIN (GLUCOPHAGE) 500 MG tablet; Take 1 tablet (500 mg total) by mouth 2 (two) times daily with a meal.  Dispense: 180 tablet; Refill: 3  2. Hypertension, unspecified type The  current medical regimen is effective; blood pressure is stable at 113/70 today; continue present plan and medications as prescribed. He will continue to take medications as prescribed, to decrease high sodium intake, excessive alcohol intake, increase potassium intake, smoking cessation,  and increase physical activity of at least 30 minutes of cardio activity daily. He will continue to follow Heart Healthy or DASH diet.  3. Anxiety Stable.  4. Dizziness  5. Diarrhea, unspecified type Probable r/t Metformin.   6. Follow up She will follow up in 6 months.   Meds ordered this encounter  Medications   metFORMIN (GLUCOPHAGE) 500 MG tablet    Sig: Take 1 tablet (500 mg total) by mouth 2 (two) times daily with a meal.    Dispense:  180 tablet    Refill:  3    Orders Placed This Encounter  Procedures   POCT urinalysis dipstick   POCT glycosylated hemoglobin (Hb A1C)   POCT glucose (manual entry)    Referral Orders  No referral(s) requested today   Kellie Becton,  MSN, FNP-BC [place] [address] [place], [place] [address] [phone] fax   Problem List Items Addressed This Visit      Cardiovascular and Mediastinum   Hypertension     Endocrine   Type 2 diabetes mellitus without complication, without long-term current use of insulin ([place]) - Primary   Relevant Medications   metFORMIN (GLUCOPHAGE) 500 MG tablet   Other Relevant Orders   POCT urinalysis dipstick (Completed)   POCT glycosylated hemoglobin (Hb A1C) (Completed)   POCT glucose (manual entry) (Completed)     Other   Anxiety    Other Visit Diagnoses    Dizziness       Diarrhea, unspecified type       Follow up          Meds ordered this encounter  Medications   metFORMIN (GLUCOPHAGE) 500 MG tablet    Sig: Take 1 tablet (500 mg total) by mouth 2 (two) times daily with a meal.    Dispense:  180 tablet    Refill:  3    Follow-up: Return in about 6 months (around 11/27/2019).    Kellie Glatter, FNP

## 2019-05-31 ENCOUNTER — Telehealth: Payer: Self-pay | Admitting: Physician Assistant

## 2019-05-31 ENCOUNTER — Inpatient Hospital Stay: Payer: BC Managed Care – PPO | Admitting: Physician Assistant

## 2019-05-31 ENCOUNTER — Inpatient Hospital Stay: Payer: BC Managed Care – PPO

## 2019-05-31 NOTE — Telephone Encounter (Signed)
Called regarding the patient's no show appointment today. Called to check on the patient. I left a voicemail and instructed the patient to call us back to reschedule her appointment.

## 2019-06-01 ENCOUNTER — Telehealth: Payer: Self-pay | Admitting: Physician Assistant

## 2019-06-01 NOTE — Telephone Encounter (Signed)
Called pt per 10/14 schmessage - unable to reach pt . Left message for patient with appt date and time

## 2019-06-05 ENCOUNTER — Telehealth: Payer: Self-pay | Admitting: Internal Medicine

## 2019-06-05 NOTE — Telephone Encounter (Signed)
Returned call re message to reschedule 10/20 appointments. Not able to reach patient. Left message confirming cancellation of 10/20 appointments. Left message for patient to return call letting us know when she can come in for appointments.

## 2019-06-06 ENCOUNTER — Other Ambulatory Visit: Payer: BC Managed Care – PPO

## 2019-06-06 ENCOUNTER — Ambulatory Visit: Payer: BC Managed Care – PPO | Admitting: Physician Assistant

## 2019-06-07 ENCOUNTER — Other Ambulatory Visit: Payer: Self-pay | Admitting: Family Medicine

## 2019-06-07 DIAGNOSIS — K219 Gastro-esophageal reflux disease without esophagitis: Secondary | ICD-10-CM

## 2019-06-08 ENCOUNTER — Other Ambulatory Visit: Payer: Self-pay

## 2019-06-08 ENCOUNTER — Telehealth: Payer: Self-pay | Admitting: Internal Medicine

## 2019-06-08 NOTE — Telephone Encounter (Signed)
Confirmed 11/4 lab/fu with patient. Rescheduled from 10/20.

## 2019-06-21 ENCOUNTER — Other Ambulatory Visit: Payer: Self-pay

## 2019-06-21 ENCOUNTER — Encounter: Payer: Self-pay | Admitting: Physician Assistant

## 2019-06-21 ENCOUNTER — Inpatient Hospital Stay: Payer: BC Managed Care – PPO

## 2019-06-21 ENCOUNTER — Inpatient Hospital Stay: Payer: BC Managed Care – PPO | Attending: Physician Assistant | Admitting: Physician Assistant

## 2019-06-21 VITALS — BP 121/79 | HR 65 | Temp 98.2°F | Resp 18 | Ht 69.0 in | Wt 233.4 lb

## 2019-06-21 DIAGNOSIS — E0590 Thyrotoxicosis, unspecified without thyrotoxic crisis or storm: Secondary | ICD-10-CM

## 2019-06-21 DIAGNOSIS — Z23 Encounter for immunization: Secondary | ICD-10-CM | POA: Diagnosis not present

## 2019-06-21 DIAGNOSIS — D693 Immune thrombocytopenic purpura: Secondary | ICD-10-CM | POA: Diagnosis not present

## 2019-06-21 DIAGNOSIS — D696 Thrombocytopenia, unspecified: Secondary | ICD-10-CM | POA: Diagnosis not present

## 2019-06-21 LAB — CMP (CANCER CENTER ONLY)
ALT: 32 U/L (ref 0–44)
AST: 12 U/L — ABNORMAL LOW (ref 15–41)
Albumin: 4.3 g/dL (ref 3.5–5.0)
Alkaline Phosphatase: 78 U/L (ref 38–126)
Anion gap: 12 (ref 5–15)
BUN: 12 mg/dL (ref 6–20)
CO2: 23 mmol/L (ref 22–32)
Calcium: 9.8 mg/dL (ref 8.9–10.3)
Chloride: 104 mmol/L (ref 98–111)
Creatinine: 0.76 mg/dL (ref 0.44–1.00)
GFR, Est AFR Am: >60 mL/min (ref >60)
GFR, Estimated: >60 mL/min (ref >60)
Glucose, Bld: 96 mg/dL (ref 70–99)
Potassium: 3.9 mmol/L (ref 3.5–5.1)
Sodium: 139 mmol/L (ref 135–145)
Total Bilirubin: 0.4 mg/dL (ref 0.3–1.2)
Total Protein: 8.3 g/dL — ABNORMAL HIGH (ref 6.5–8.1)

## 2019-06-21 LAB — CBC WITH DIFFERENTIAL (CANCER CENTER ONLY)
Abs Immature Granulocytes: 0.03 10*3/uL (ref 0.00–0.07)
Basophils Absolute: 0 10*3/uL (ref 0.0–0.1)
Basophils Relative: 0 %
Eosinophils Absolute: 0.2 10*3/uL (ref 0.0–0.5)
Eosinophils Relative: 2 %
HCT: 37.9 % (ref 36.0–46.0)
Hemoglobin: 12.4 g/dL (ref 12.0–15.0)
Immature Granulocytes: 0 %
Lymphocytes Relative: 30 %
Lymphs Abs: 2.5 10*3/uL (ref 0.7–4.0)
MCH: 27.8 pg (ref 26.0–34.0)
MCHC: 32.7 g/dL (ref 30.0–36.0)
MCV: 85 fL (ref 80.0–100.0)
Monocytes Absolute: 0.8 10*3/uL (ref 0.1–1.0)
Monocytes Relative: 10 %
Neutro Abs: 4.7 10*3/uL (ref 1.7–7.7)
Neutrophils Relative %: 58 %
Platelet Count: 203 10*3/uL (ref 150–400)
RBC: 4.46 MIL/uL (ref 3.87–5.11)
RDW: 15.1 % (ref 11.5–15.5)
WBC Count: 8.2 10*3/uL (ref 4.0–10.5)
nRBC: 0 % (ref 0.0–0.2)

## 2019-06-21 LAB — LACTATE DEHYDROGENASE: LDH: 137 U/L (ref 98–192)

## 2019-06-21 MED ORDER — INFLUENZA VAC SPLIT QUAD 0.5 ML IM SUSY
0.5000 mL | PREFILLED_SYRINGE | Freq: Once | INTRAMUSCULAR | Status: AC
  Administered 2019-06-21: 0.5 mL via INTRAMUSCULAR

## 2019-06-21 MED ORDER — INFLUENZA VAC SPLIT QUAD 0.5 ML IM SUSY
PREFILLED_SYRINGE | INTRAMUSCULAR | Status: AC
  Filled 2019-06-21: qty 0.5

## 2019-06-21 NOTE — Progress Notes (Signed)
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  CURRENT THERAPY: Observation  INTERVAL HISTORY: Kellie Shaffer 28 y.o. female returns to the clinic for a follow-up visit.  The patient is feeling well today without any concerning complaints.  She denies any bruising. She reports a minor nose bleed about once a month. She states that this is triggered when she blows her nose and she notes the presence of blood when she blows her nose.  She denies melena, gingival bleeding, hematuria, abnormal vaginal bleeding, or hematochezia.  She denies any headache or visual changes.  She denies any nausea, vomiting, diarrhea, constipation, or abdominal pain. She has hyperthyroidism and takes methimazole. She is followed closely for her hyperthyroidism by Dr. Everardo AllEllison. She is here today for evaluation and repeat blood work.   MEDICAL HISTORY: Past Medical History:  Diagnosis Date  . Abnormal Pap smear of cervix 2019   LSIL  . Abortion May 2013  . Anemia   . Anxiety   . Diabetes mellitus without complication (HCC)   . History of ITP   . History of palpitations    Cardiac event monitor June 2019: Normal.  Sinus rhythm no arrhythmias or significant PACs/PVCs.  Marland Kitchen. Hypercholesteremia   . Hypertension   . Hyperthyroidism   . Iron deficiency anemia 09/04/2016  . Peripheral vertigo     ALLERGIES:  is allergic to benadryl [diphenhydramine hcl] and chloroxine.  MEDICATIONS:  Current Outpatient Medications  Medication Sig Dispense Refill  . Acetaminophen (TYLENOL PO) Take by mouth.    Marland Kitchen. atorvastatin (LIPITOR) 40 MG tablet Take 1 tablet (40 mg total) by mouth daily. 90 tablet 3  . Biotin 6045410000 MCG TABS Take 1,000 mcg by mouth daily.     . busPIRone (BUSPAR) 10 MG tablet Take 1 tablet (10  mg total) by mouth daily. 90 tablet 2  . cetirizine (ZYRTEC) 10 MG tablet Take 1 tablet (10 mg total) by mouth daily as needed for allergies. 90 tablet 1  . esomeprazole (NEXIUM) 40 MG capsule TAKE 1 CAPSULE(40 MG) BY MOUTH DAILY 30 capsule 3  . famotidine (PEPCID) 20 MG tablet TAKE 1 TABLET BY MOUTH TWICE DAILY AS NEEDED FOR HEARTBURN/INDIGESTION 60 tablet 3  . glipiZIDE (GLUCOTROL) 10 MG tablet TAKE 1 TABLET(10 MG) BY MOUTH TWICE DAILY BEFORE A MEAL 60 tablet 3  . IRON PO Take 1 tablet by mouth daily.     Marland Kitchen. lisinopril (PRINIVIL,ZESTRIL) 5 MG tablet Take 1 tablet (5 mg total) by mouth daily. 90 tablet 2  . meclizine (ANTIVERT) 25 MG tablet Take 1 tablet (25 mg total) by mouth 3 (three) times daily as needed for dizziness. 30 tablet 3  . metFORMIN (GLUCOPHAGE) 500 MG tablet Take 1 tablet (500 mg total) by mouth 2 (two) times daily with a meal. 180 tablet 3  . methimazole (TAPAZOLE) 10 MG tablet Take 1 tablet (10 mg total) by mouth daily. 90 tablet 3  . metoprolol tartrate (LOPRESSOR) 25 MG tablet Take 0.5 tablets (12.5 mg total) by mouth every morning. 90 tablet 2  . Multiple Vitamins-Minerals (MULTIVITAMIN ADULT PO) Take by mouth daily.    . norethindrone (MICRONOR) 0.35 MG tablet Take 1 tablet (0.35 mg total) by mouth daily. 3 Package 4   No current facility-administered medications for this visit.  SURGICAL HISTORY:  Past Surgical History:  Procedure Laterality Date  . LAPAROSCOPIC APPENDECTOMY N/A 01/14/2017   Procedure: APPENDECTOMY LAPAROSCOPIC;  Surgeon: Fanny Skates, MD;  Location: [facility];  Service: General;  Laterality: N/A;    REVIEW OF SYSTEMS:   Review of Systems  Constitutional: Negative for appetite change, chills, fatigue, fever and unexpected weight change.  HENT: Rare nose bleed about once per month that lasts a few minutes. Negative for mouth sores, sore throat and trouble swallowing.   Eyes: Negative for eye problems and icterus.  Respiratory: Negative for cough,  hemoptysis, shortness of breath and wheezing.   Cardiovascular: Negative for chest pain and leg swelling.  Gastrointestinal: Negative for abdominal pain, constipation, diarrhea, nausea and vomiting.  Genitourinary: Negative for bladder incontinence, difficulty urinating, dysuria, frequency and hematuria.   Musculoskeletal: Negative for back pain, gait problem, neck pain and neck stiffness.  Skin: Negative for itching and rash.  Neurological: Negative for dizziness, extremity weakness, gait problem, headaches, light-headedness and seizures.  Hematological: Negative for adenopathy. Does not bruise/bleed easily.  Psychiatric/Behavioral: Negative for confusion, depression and sleep disturbance. The patient is not nervous/anxious.     PHYSICAL EXAMINATION:  Blood pressure 121/79, pulse 65, temperature 98.2 F (36.8 C), temperature source Temporal, resp. rate 18, height 5\' 9"  (1.753 m), weight 233 lb 6.4 oz (105.9 kg), SpO2 100 %.  ECOG PERFORMANCE STATUS: 0 - Asymptomatic  Physical Exam  Constitutional: Oriented to person, place, and time and well-developed, well-nourished, and in no distress. HENT:  Head: Normocephalic and atraumatic.  Mouth/Throat: Oropharynx is clear and moist. No oropharyngeal exudate.  Eyes: Conjunctivae are normal. Right eye exhibits no discharge. Left eye exhibits no discharge. No scleral icterus.  Neck: Normal range of motion. Neck supple.  Cardiovascular: Normal rate, regular rhythm, normal heart sounds and intact distal pulses.   Pulmonary/Chest: Effort normal and breath sounds normal. No respiratory distress. No wheezes. No rales.  Abdominal: Soft. Bowel sounds are normal. Exhibits no distension and no mass. There is no tenderness.  Musculoskeletal: Normal range of motion. Exhibits no edema.  Lymphadenopathy:    No cervical adenopathy.  Neurological: Alert and oriented to person, place, and time. Exhibits normal muscle tone. Gait normal. Coordination normal.   Skin: Skin is warm and dry. No rash noted. Not diaphoretic. No erythema. No pallor.  Psychiatric: Mood, memory and judgment normal.  Vitals reviewed.  LABORATORY DATA: Lab Results  Component Value Date   WBC 8.2 06/21/2019   HGB 12.4 06/21/2019   HCT 37.9 06/21/2019   MCV 85.0 06/21/2019   PLT 203 06/21/2019      Chemistry      Component Value Date/Time   NA 139 06/21/2019 1016   NA 139 01/12/2018 1522   NA 132 (L) 08/13/2017 1044   K 3.9 06/21/2019 1016   K 4.3 08/13/2017 1044   CL 104 06/21/2019 1016   CO2 23 06/21/2019 1016   CO2 25 08/13/2017 1044   BUN 12 06/21/2019 1016   BUN 9 01/12/2018 1522   BUN 18.6 08/13/2017 1044   CREATININE 0.76 06/21/2019 1016   CREATININE 1.3 (H) 08/13/2017 1044   GLU 517 (H) 08/13/2017 1257      Component Value Date/Time   CALCIUM 9.8 06/21/2019 1016   CALCIUM 9.9 08/13/2017 1044   ALKPHOS 78 06/21/2019 1016   ALKPHOS 100 08/13/2017 1044   AST 12 (L) 06/21/2019 1016   AST 5 08/13/2017 1044   ALT 32 06/21/2019 1016   ALT 15 08/13/2017 1044  BILITOT 0.4 06/21/2019 1016   BILITOT 0.52 08/13/2017 1044       RADIOGRAPHIC STUDIES:  No results found.   ASSESSMENT/PLAN:  This is a very pleasant 28 year old African-American female with idiopathic thrombocytopenia purpura.  She was treated with a prednisone taper because of her significant thrombocytopenia.  She is currently on observation she is feeling fine. The patient was seen with Dr. Arbutus Ped today.  Labs were reviewed.  Her CBC from today is unremarkable. Her platelet count is normal at 203,000.   Dr. Arbutus Ped recommends that she continue on observation. We will see her back for follow-up visit in 6 months with repeat CBC, CMP, and LDH.  The patient is being treated for hyperthyroidism. She is on methimazole which is managed by her endocrinologist.   The patient will receive her flu shot while in the clinic today.  The patient was advised to call immediately if she has  any concerning symptoms in the interval. The patient voices understanding of current disease status and treatment options and is in agreement with the current care plan. All questions were answered. The patient knows to call the clinic with any problems, questions or concerns. We can certainly see the patient much sooner if necessary   Orders Placed This Encounter  Procedures  . CBC with Differential (Cancer Center Only)    Standing Status:   Future    Standing Expiration Date:   06/20/2020  . CMP (Cancer Center only)    Standing Status:   Future    Standing Expiration Date:   06/20/2020  . Lactate dehydrogenase (LDH)    Standing Status:   Future    Standing Expiration Date:   06/20/2020     Johnette Abraham Heilingoetter, PA-C 06/21/19  ADDENDUM: Hematology/Oncology Attending: I had a face-to-face encounter with the patient today.  I recommended her care plan.  This is a very pleasant 28 years old African-American female with idiopathic thrombocytopenic purpura as well as history of hyper thyroidism.  The patient is followed by Dr. Everardo All for her hyperthyroidism issues.  She is feeling fine and currently on methimazole.  She was treated in the past with prednisone for ITP and has been on observation now for several months with no need for treatment. She came today for evaluation and repeat blood work.  Repeat CBC today showed no concerning abnormalities. I recommended for the patient to continue on observation with repeat CBC, comprehensive metabolic panel and LDH in 6 months. She was advised to call immediately if she has any concerning symptoms in the interval.  Disclaimer: This note was dictated with voice recognition software. Similar sounding words can inadvertently be transcribed and may be missed upon review. Lajuana Matte, MD 06/21/19

## 2019-06-22 ENCOUNTER — Telehealth: Payer: Self-pay | Admitting: Internal Medicine

## 2019-06-22 NOTE — Telephone Encounter (Signed)
Scheduled per los. Called and spoke with patient. She was working. Will check her mychart and call back if changes need to be made

## 2019-06-24 ENCOUNTER — Other Ambulatory Visit: Payer: Self-pay | Admitting: Family Medicine

## 2019-06-24 DIAGNOSIS — R42 Dizziness and giddiness: Secondary | ICD-10-CM

## 2019-07-07 ENCOUNTER — Other Ambulatory Visit: Payer: Self-pay | Admitting: Family Medicine

## 2019-07-07 DIAGNOSIS — F419 Anxiety disorder, unspecified: Secondary | ICD-10-CM

## 2019-07-17 ENCOUNTER — Other Ambulatory Visit: Payer: Self-pay | Admitting: Family Medicine

## 2019-08-31 ENCOUNTER — Other Ambulatory Visit: Payer: Self-pay | Admitting: Endocrinology

## 2019-09-06 LAB — HM DIABETES EYE EXAM

## 2019-09-20 ENCOUNTER — Other Ambulatory Visit: Payer: Self-pay

## 2019-09-20 ENCOUNTER — Ambulatory Visit (INDEPENDENT_AMBULATORY_CARE_PROVIDER_SITE_OTHER): Payer: BC Managed Care – PPO | Admitting: Endocrinology

## 2019-09-20 ENCOUNTER — Encounter: Payer: Self-pay | Admitting: Endocrinology

## 2019-09-20 VITALS — BP 132/70 | HR 88 | Ht 69.0 in | Wt 238.0 lb

## 2019-09-20 DIAGNOSIS — E119 Type 2 diabetes mellitus without complications: Secondary | ICD-10-CM

## 2019-09-20 DIAGNOSIS — E0590 Thyrotoxicosis, unspecified without thyrotoxic crisis or storm: Secondary | ICD-10-CM

## 2019-09-20 LAB — T4, FREE: Free T4: 0.86 ng/dL (ref 0.60–1.60)

## 2019-09-20 LAB — BASIC METABOLIC PANEL
BUN: 13 mg/dL (ref 6–23)
CO2: 29 mEq/L (ref 19–32)
Calcium: 10 mg/dL (ref 8.4–10.5)
Chloride: 102 mEq/L (ref 96–112)
Creatinine, Ser: 0.85 mg/dL (ref 0.40–1.20)
GFR: 95.33 mL/min (ref >60.00)
Glucose, Bld: 83 mg/dL (ref 70–99)
Potassium: 3.7 mEq/L (ref 3.5–5.1)
Sodium: 138 mEq/L (ref 135–145)

## 2019-09-20 LAB — TSH: TSH: 3.77 u[IU]/mL (ref 0.35–4.50)

## 2019-09-20 NOTE — Patient Instructions (Addendum)
Blood tests are requested for you today.  We'll let you know about the results.  If ever you have fever while taking methimazole, stop it and call us, even if the reason is obvious, because of the risk of a rare side-effect. It is best to never miss the medication.  However, if you do miss it, next best is to double up the next time.   In view of your medical condition, you should avoid pregnancy until we have decided it is safe.   Please come back for a follow-up appointment in 6 months.

## 2019-09-20 NOTE — Progress Notes (Signed)
Subjective:    Patient ID: Kellie Shaffer, female    DOB: Nov 12, 1990, 29 y.o.   MRN: [id number]  HPI Pt returns for f/u of hyperthyroidism (dx'ed 2013, during a pregnancy; US showed a small multinodular goiter, but she also has proptosis; she was then rx'ed with tapazole; she was lost to f/u, but hyperthyroidism was found again during a hospitalization in late 2015 for appendicitis; she was restarted on tapazole then; pt says she is not at risk for another pregnancy; f/u TFT in 2018 were normal off rx; later in 2018, it was resumed due to suppressed TSH; she is not at risk for pregnancy).   She says she never misses the methimazole.  pt states she feels well in general, except for palpitations.  no tremor. Past Medical History:  Diagnosis Date  . Abnormal Pap smear of cervix 2019   LSIL  . Abortion May 2013  . Anemia   . Anxiety   . Diabetes mellitus without complication (HCC)   . History of ITP   . History of palpitations    Cardiac event monitor June 2019: Normal.  Sinus rhythm no arrhythmias or significant PACs/PVCs.  Marland Kitchen Hypercholesteremia   . Hypertension   . Hyperthyroidism   . Iron deficiency anemia 09/04/2016  . Peripheral vertigo     Past Surgical History:  Procedure Laterality Date  . LAPAROSCOPIC APPENDECTOMY N/A 01/14/2017   Procedure: APPENDECTOMY LAPAROSCOPIC;  Surgeon: Claud Kelp, MD;  Location: [facility];  Service: General;  Laterality: N/A;    Social History   Socioeconomic History  . Marital status: Single    Spouse name: Not on file  . Number of children: Not on file  . Years of education: 74  . Highest education level: Not on file  Occupational History  . Occupation: [employer]  Tobacco Use  . Smoking status: Current Every Day Smoker    Packs/day: 0.25    Types: Cigarettes  . Smokeless tobacco: Never Used  Substance and Sexual Activity  . Alcohol use: Not Currently  . Drug use: No  . Sexual activity: Yes    Partners: Male    Birth  control/protection: Condom, Pill  Other Topics Concern  . Not on file  Social History Narrative   Regular exercise-yes   Caffeine Use-yes         Social Determinants of Health   Financial Resource Strain:   . Difficulty of Paying Living Expenses: Not on file  Food Insecurity:   . Worried About Programme researcher, broadcasting/film/video in the Last Year: Not on file  . Ran Out of Food in the Last Year: Not on file  Transportation Needs:   . Lack of Transportation (Medical): Not on file  . Lack of Transportation (Non-Medical): Not on file  Physical Activity:   . Days of Exercise per Week: Not on file  . Minutes of Exercise per Session: Not on file  Stress:   . Feeling of Stress : Not on file  Social Connections:   . Frequency of Communication with Friends and Family: Not on file  . Frequency of Social Gatherings with Friends and Family: Not on file  . Attends Religious Services: Not on file  . Active Member of Clubs or Organizations: Not on file  . Attends Banker Meetings: Not on file  . Marital Status: Not on file  Intimate Partner Violence:   . Fear of Current or Ex-Partner: Not on file  . Emotionally Abused: Not on file  .  Physically Abused: Not on file  . Sexually Abused: Not on file    Current Outpatient Medications on File Prior to Visit  Medication Sig Dispense Refill  . Acetaminophen (TYLENOL PO) Take by mouth.    Marland Kitchen atorvastatin (LIPITOR) 40 MG tablet Take 1 tablet (40 mg total) by mouth daily. 90 tablet 3  . Biotin 54270 MCG TABS Take 1,000 mcg by mouth daily.     . busPIRone (BUSPAR) 10 MG tablet TAKE 1 TABLET DAILY 90 tablet 3  . cetirizine (ZYRTEC) 10 MG tablet Take 1 tablet (10 mg total) by mouth daily as needed for allergies. 90 tablet 1  . esomeprazole (NEXIUM) 40 MG capsule TAKE 1 CAPSULE(40 MG) BY MOUTH DAILY 30 capsule 3  . famotidine (PEPCID) 20 MG tablet TAKE 1 TABLET BY MOUTH TWICE DAILY AS NEEDED FOR HEARTBURN/INDIGESTION 60 tablet 3  . glipiZIDE (GLUCOTROL)  10 MG tablet TAKE 1 TABLET(10 MG) BY MOUTH TWICE DAILY BEFORE A MEAL 60 tablet 3  . IRON PO Take 1 tablet by mouth daily.     Marland Kitchen lisinopril (ZESTRIL) 5 MG tablet TAKE 1 TABLET DAILY 90 tablet 3  . meclizine (ANTIVERT) 25 MG tablet TAKE 1 TABLET(25 MG) BY MOUTH THREE TIMES DAILY AS NEEDED FOR DIZZINESS 30 tablet 3  . metFORMIN (GLUCOPHAGE) 500 MG tablet Take 1 tablet (500 mg total) by mouth 2 (two) times daily with a meal. 180 tablet 3  . methimazole (TAPAZOLE) 10 MG tablet TAKE 1 TABLET DAILY 90 tablet 3  . metoprolol tartrate (LOPRESSOR) 25 MG tablet Take 0.5 tablets (12.5 mg total) by mouth every morning. 90 tablet 2  . Multiple Vitamins-Minerals (MULTIVITAMIN ADULT PO) Take by mouth daily.    . norethindrone (MICRONOR) 0.35 MG tablet Take 1 tablet (0.35 mg total) by mouth daily. 3 Package 4   No current facility-administered medications on file prior to visit.    Allergies  Allergen Reactions  . Benadryl [Diphenhydramine Hcl] Anaphylaxis and Hives  . Chloroxine Hives    Clorox    Family History  Problem Relation Age of Onset  . Diabetes Other        Parent  . Hypertension Other        Parent  . Hyperlipidemia Other        Parent  . Arthritis Other        Parent  . Breast cancer Mother 45    BP 132/70 (BP Location: Left Arm, Patient Position: Sitting, Cuff Size: Large)   Pulse 88   Ht 5\' 9"  (1.753 m)   Wt 238 lb (108 kg)   SpO2 97%   BMI 35.15 kg/m    Review of Systems Denies fever    Objective:   Physical Exam VITAL SIGNS:  See vs page GENERAL: no distress NECK: thyroid is slightly and diffusely enlarged.   Lab Results  Component Value Date   TSH 3.77 09/20/2019      Assessment & Plan:  Hyperthyroidism: well-controlled.  DM: check BMET at pt's request   Patient Instructions  Blood tests are requested for you today.  We'll let you know about the results.  If ever you have fever while taking methimazole, stop it and call 11/18/2019, even if the reason is  obvious, because of the risk of a rare side-effect. It is best to never miss the medication.  However, if you do miss it, next best is to double up the next time.   In view of your medical condition, you should avoid pregnancy  until we have decided it is safe.   Please come back for a follow-up appointment in 6 months.

## 2019-09-27 ENCOUNTER — Other Ambulatory Visit: Payer: Self-pay

## 2019-09-27 ENCOUNTER — Ambulatory Visit (INDEPENDENT_AMBULATORY_CARE_PROVIDER_SITE_OTHER): Payer: BC Managed Care – PPO | Admitting: Family Medicine

## 2019-09-27 ENCOUNTER — Encounter: Payer: Self-pay | Admitting: Family Medicine

## 2019-09-27 VITALS — BP 134/73 | HR 66 | Temp 98.3°F | Ht 69.0 in | Wt 238.0 lb

## 2019-09-27 DIAGNOSIS — R7303 Prediabetes: Secondary | ICD-10-CM

## 2019-09-27 DIAGNOSIS — F419 Anxiety disorder, unspecified: Secondary | ICD-10-CM | POA: Diagnosis not present

## 2019-09-27 DIAGNOSIS — I10 Essential (primary) hypertension: Secondary | ICD-10-CM

## 2019-09-27 DIAGNOSIS — N760 Acute vaginitis: Secondary | ICD-10-CM

## 2019-09-27 DIAGNOSIS — E0590 Thyrotoxicosis, unspecified without thyrotoxic crisis or storm: Secondary | ICD-10-CM

## 2019-09-27 DIAGNOSIS — B9689 Other specified bacterial agents as the cause of diseases classified elsewhere: Secondary | ICD-10-CM | POA: Diagnosis not present

## 2019-09-27 DIAGNOSIS — E119 Type 2 diabetes mellitus without complications: Secondary | ICD-10-CM | POA: Diagnosis not present

## 2019-09-27 DIAGNOSIS — Z09 Encounter for follow-up examination after completed treatment for conditions other than malignant neoplasm: Secondary | ICD-10-CM

## 2019-09-27 LAB — POCT URINALYSIS DIPSTICK
Bilirubin, UA: NEGATIVE
Blood, UA: NEGATIVE
Glucose, UA: NEGATIVE
Ketones, UA: NEGATIVE
Nitrite, UA: NEGATIVE
Protein, UA: NEGATIVE
Spec Grav, UA: >=1.03 — AB (ref 1.010–1.025)
Urobilinogen, UA: 0.2 E.U./dL
pH, UA: 5.5 (ref 5.0–8.0)

## 2019-09-27 LAB — POCT GLYCOSYLATED HEMOGLOBIN (HGB A1C): Hemoglobin A1C: 7.2 % — AB (ref 4.0–5.6)

## 2019-09-27 LAB — GLUCOSE, POCT (MANUAL RESULT ENTRY): POC Glucose: 234 mg/dl — AB (ref 70–99)

## 2019-09-27 NOTE — Progress Notes (Signed)
Patient [facility]   Established Patient Office Visit  Subjective:  Patient ID: Kellie Shaffer, female    DOB: 08-31-1990  Age: 29 y.o. MRN: [id number]  CC:  Chief Complaint  Patient presents with  . Follow-up    vaginal odor    HPI Kellie Shaffer is a 29 year old female who presents for Follow Up today.   Past Medical History:  Diagnosis Date  . Abnormal Pap smear of cervix 2019   LSIL  . Abortion May 2013  . Anemia   . Anxiety   . Diabetes mellitus without complication ([place])   . History of ITP   . History of palpitations    Cardiac event monitor June 2019: Normal.  Sinus rhythm no arrhythmias or significant PACs/PVCs.  Marland Kitchen Hypercholesteremia   . Hypertension   . Hyperthyroidism   . Iron deficiency anemia 09/04/2016  . Peripheral vertigo    Current Status: Since her last office visit, she is doing well with no complaints. She has c/o vaginal odor. She continues to follow up with Endocrinology as needed. She also recently followed up with Optometry. Her platelets are in therapeutic range and now follows with Dr. Julien Nordmann, in Hematology  every 6 months. She denies fevers, chills, fatigue, recent infections, weight loss, and night sweats. She has not had any headaches, visual changes, dizziness, and falls. No chest pain, heart palpitations, cough and shortness of breath reported. No reports of GI problems such as nausea, vomiting, diarrhea, and constipation. She has no reports of blood in stools, dysuria and hematuria. No depression or anxiety reported today. She denies suicidal ideations, homicidal ideations, or auditory hallucinations. She denies pain today.   Past Surgical History:  Procedure Laterality Date  . LAPAROSCOPIC APPENDECTOMY N/A 01/14/2017   Procedure: APPENDECTOMY LAPAROSCOPIC;  Surgeon: Fanny Skates, MD;  Location: [facility];  Service: General;  Laterality: N/A;    Family History  Problem Relation Age of Onset   . Diabetes Other        Parent  . Hypertension Other        Parent  . Hyperlipidemia Other        Parent  . Arthritis Other        Parent  . Breast cancer Mother 38    Social History   Socioeconomic History  . Marital status: Single    Spouse name: Not on file  . Number of children: Not on file  . Years of education: 18  . Highest education level: Not on file  Occupational History  . Occupation: [employer]  Tobacco Use  . Smoking status: Former Smoker    Packs/day: 0.25    Types: Cigarettes  . Smokeless tobacco: Former Systems developer    Quit date: 08/17/2019  Substance and Sexual Activity  . Alcohol use: Not Currently  . Drug use: No  . Sexual activity: Yes    Partners: Male    Birth control/protection: Condom, Pill  Other Topics Concern  . Not on file  Social History Narrative   Regular exercise-yes   Caffeine Use-yes         Social Determinants of Health   Financial Resource Strain:   . Difficulty of Paying Living Expenses: Not on file  Food Insecurity:   . Worried About Charity fundraiser in the Last Year: Not on file  . Ran Out of Food in the Last Year: Not on file  Transportation Needs:   . Lack of  Transportation (Medical): Not on file  . Lack of Transportation (Non-Medical): Not on file  Physical Activity:   . Days of Exercise per Week: Not on file  . Minutes of Exercise per Session: Not on file  Stress:   . Feeling of Stress : Not on file  Social Connections:   . Frequency of Communication with Friends and Family: Not on file  . Frequency of Social Gatherings with Friends and Family: Not on file  . Attends Religious Services: Not on file  . Active Member of Clubs or Organizations: Not on file  . Attends Archivist Meetings: Not on file  . Marital Status: Not on file  Intimate Partner Violence:   . Fear of Current or Ex-Partner: Not on file  . Emotionally Abused: Not on file  . Physically Abused: Not on file  . Sexually Abused: Not on  file    Outpatient Medications Prior to Visit  Medication Sig Dispense Refill  . Acetaminophen (TYLENOL PO) Take by mouth.    Marland Kitchen atorvastatin (LIPITOR) 40 MG tablet Take 1 tablet (40 mg total) by mouth daily. 90 tablet 3  . Biotin 10000 MCG TABS Take 1,000 mcg by mouth daily.     . busPIRone (BUSPAR) 10 MG tablet TAKE 1 TABLET DAILY 90 tablet 3  . cetirizine (ZYRTEC) 10 MG tablet Take 1 tablet (10 mg total) by mouth daily as needed for allergies. 90 tablet 1  . esomeprazole (NEXIUM) 40 MG capsule TAKE 1 CAPSULE(40 MG) BY MOUTH DAILY 30 capsule 3  . famotidine (PEPCID) 20 MG tablet TAKE 1 TABLET BY MOUTH TWICE DAILY AS NEEDED FOR HEARTBURN/INDIGESTION 60 tablet 3  . glipiZIDE (GLUCOTROL) 10 MG tablet TAKE 1 TABLET(10 MG) BY MOUTH TWICE DAILY BEFORE A MEAL 60 tablet 3  . IRON PO Take 1 tablet by mouth daily.     Marland Kitchen lisinopril (ZESTRIL) 5 MG tablet TAKE 1 TABLET DAILY 90 tablet 3  . meclizine (ANTIVERT) 25 MG tablet TAKE 1 TABLET(25 MG) BY MOUTH THREE TIMES DAILY AS NEEDED FOR DIZZINESS 30 tablet 3  . metFORMIN (GLUCOPHAGE) 500 MG tablet Take 1 tablet (500 mg total) by mouth 2 (two) times daily with a meal. 180 tablet 3  . methimazole (TAPAZOLE) 10 MG tablet TAKE 1 TABLET DAILY 90 tablet 3  . metoprolol tartrate (LOPRESSOR) 25 MG tablet Take 0.5 tablets (12.5 mg total) by mouth every morning. 90 tablet 2  . Multiple Vitamins-Minerals (MULTIVITAMIN ADULT PO) Take by mouth daily.    . norethindrone (MICRONOR) 0.35 MG tablet Take 1 tablet (0.35 mg total) by mouth daily. 3 Package 4   No facility-administered medications prior to visit.    Allergies  Allergen Reactions  . Benadryl [Diphenhydramine Hcl] Anaphylaxis and Hives  . Chloroxine Hives    Clorox    ROS Review of Systems  Constitutional: Negative.   HENT: Negative.   Eyes: Negative.   Respiratory: Negative.   Cardiovascular: Negative.   Gastrointestinal: Positive for abdominal distention.  Endocrine: Negative.   Genitourinary:  Negative.        Vaginal odor  Musculoskeletal: Negative.   Skin: Negative.   Allergic/Immunologic: Negative.   Neurological: Negative.   Hematological: Negative.   Psychiatric/Behavioral: Negative.     Objective:    Physical Exam  Constitutional: She is oriented to person, place, and time. She appears well-developed and well-nourished.  HENT:  Head: Normocephalic and atraumatic.  Eyes: Conjunctivae are normal.  Cardiovascular: Normal rate, regular rhythm, normal heart sounds and intact distal  pulses.  Pulmonary/Chest: Effort normal and breath sounds normal.  Abdominal: Bowel sounds are normal. She exhibits distension (obese). There is abdominal tenderness.  Musculoskeletal:        General: Normal range of motion.     Cervical back: Normal range of motion and neck supple.  Neurological: She is alert and oriented to person, place, and time. She has normal reflexes.  Skin: Skin is warm and dry.  Psychiatric: She has a normal mood and affect. Her behavior is normal. Judgment and thought content normal.  Nursing note and vitals reviewed.   BP 134/73   Pulse 66   Temp 98.3 F (36.8 C) (Oral)   Ht '5\' 9"'  (1.753 m)   Wt 238 lb (108 kg)   SpO2 99%   BMI 35.15 kg/m  Wt Readings from Last 3 Encounters:  09/27/19 238 lb (108 kg)  09/20/19 238 lb (108 kg)  06/21/19 233 lb 6.4 oz (105.9 kg)     Health Maintenance Due  Topic Date Due  . PNEUMOCOCCAL POLYSACCHARIDE VACCINE AGE 27-64 HIGH RISK  03/10/1992  . FOOT EXAM  09/10/2018    There are no preventive care reminders to display for this patient.  Lab Results  Component Value Date   TSH 3.77 09/20/2019   Lab Results  Component Value Date   WBC 8.2 06/21/2019   HGB 12.4 06/21/2019   HCT 37.9 06/21/2019   MCV 85.0 06/21/2019   PLT 203 06/21/2019   Lab Results  Component Value Date   NA 138 09/20/2019   K 3.7 09/20/2019   CHLORIDE 95 (L) 08/13/2017   CO2 29 09/20/2019   GLUCOSE 83 09/20/2019   BUN 13 09/20/2019     CREATININE 0.85 09/20/2019   BILITOT 0.4 06/21/2019   ALKPHOS 78 06/21/2019   AST 12 (L) 06/21/2019   ALT 32 06/21/2019   PROT 8.3 (H) 06/21/2019   ALBUMIN 4.3 06/21/2019   CALCIUM 10.0 09/20/2019   ANIONGAP 12 06/21/2019   EGFR >60 08/13/2017   GFR 95.33 09/20/2019   Lab Results  Component Value Date   CHOL 216 (H) 08/31/2017   Lab Results  Component Value Date   HDL 35 (L) 08/31/2017   Lab Results  Component Value Date   LDLCALC 168 (H) 08/31/2017   Lab Results  Component Value Date   TRIG 67 08/31/2017   Lab Results  Component Value Date   CHOLHDL 6.2 08/31/2017   Lab Results  Component Value Date   HGBA1C 7.2 (A) 09/27/2019      Assessment & Plan:   1. Type 2 diabetes mellitus without complication, without long-term current use of insulin (HCC) She will continue medication as prescribed, to decrease foods/beverages high in sugars and carbs and follow Heart Healthy or DASH diet. Increase physical activity to at least 30 minutes cardio exercise daily.  - POCT urinalysis dipstick - Vaginitis/Vaginosis, DNA Probe - POCT glucose (manual entry) - POCT glycosylated hemoglobin (Hb A1C)  2. Hemoglobin A1C between 7% and 9% indicating borderline diabetic control Hgb A1c at 7.2 today, from 6.0 on 05/28/2020. Monitor.   3. Bacterial vaginosis - Vaginitis/Vaginosis, DNA Probe  4. Anxiety Stable today.   5. Hypertension, unspecified type The current medical regimen is effective; blood pressure is stable at 134/73 today; continue present plan and medications as prescribed.  She will continue to take medications as prescribed, to decrease high sodium intake, excessive alcohol intake, increase potassium intake, smoking cessation, and increase physical activity of at least 30 minutes of  cardio activity daily. She will continue to follow Heart Healthy or DASH diet.  6. Hyperthyroidism Continue monitoring by Endocrinology.   7. Follow up She will follow up in 6  months.   No orders of the defined types were placed in this encounter.   Orders Placed This Encounter  Procedures  . Vaginitis/Vaginosis, DNA Probe  . POCT urinalysis dipstick  . POCT glucose (manual entry)  . POCT glycosylated hemoglobin (Hb A1C)    Referral Orders  No referral(s) requested today    Kellie Becton,  MSN, FNP-BC [place], [place] [address] [phone] fax    Problem List Items Addressed This Visit      Cardiovascular and Mediastinum   Hypertension     Endocrine   Hyperthyroidism (Chronic)   Type 2 diabetes mellitus without complication, without long-term current use of insulin (HCC) - Primary   Relevant Orders   POCT urinalysis dipstick (Completed)   Vaginitis/Vaginosis, DNA Probe   POCT glucose (manual entry) (Completed)   POCT glycosylated hemoglobin (Hb A1C) (Completed)     Other   Anxiety    Other Visit Diagnoses    Hemoglobin A1C between 7% and 9% indicating borderline diabetic control       Bacterial vaginosis       Relevant Orders   Vaginitis/Vaginosis, DNA Probe   Follow up          No orders of the defined types were placed in this encounter.   Follow-up: Return in about 6 months (around 03/26/2020).    Kellie Glatter, FNP

## 2019-09-28 DIAGNOSIS — R7303 Prediabetes: Secondary | ICD-10-CM | POA: Insufficient documentation

## 2019-09-28 DIAGNOSIS — E119 Type 2 diabetes mellitus without complications: Secondary | ICD-10-CM | POA: Insufficient documentation

## 2019-09-29 LAB — VAGINITIS/VAGINOSIS, DNA PROBE
Candida Species: NEGATIVE
Gardnerella vaginalis: POSITIVE — AB
Trichomonas vaginosis: NEGATIVE

## 2019-10-06 ENCOUNTER — Other Ambulatory Visit: Payer: Self-pay | Admitting: Family Medicine

## 2019-10-06 ENCOUNTER — Telehealth: Payer: Self-pay | Admitting: Family Medicine

## 2019-10-06 DIAGNOSIS — B9689 Other specified bacterial agents as the cause of diseases classified elsewhere: Secondary | ICD-10-CM

## 2019-10-06 MED ORDER — METRONIDAZOLE 500 MG PO TABS: 500.0000 mg | ORAL_TABLET | Freq: Two times a day (BID) | ORAL | 0 refills | Status: AC

## 2019-10-06 NOTE — Telephone Encounter (Signed)
Kellie Shaffer has seen her recent lab results on My Chart. Patient wants to know if you will be sending an prescription?

## 2019-10-13 ENCOUNTER — Other Ambulatory Visit: Payer: Self-pay | Admitting: Family Medicine

## 2019-10-13 DIAGNOSIS — E119 Type 2 diabetes mellitus without complications: Secondary | ICD-10-CM

## 2019-10-13 DIAGNOSIS — R7309 Other abnormal glucose: Secondary | ICD-10-CM

## 2019-10-15 ENCOUNTER — Other Ambulatory Visit: Payer: Self-pay | Admitting: Family Medicine

## 2019-10-16 ENCOUNTER — Other Ambulatory Visit: Payer: Self-pay | Admitting: Family Medicine

## 2019-10-16 DIAGNOSIS — K219 Gastro-esophageal reflux disease without esophagitis: Secondary | ICD-10-CM

## 2019-11-08 ENCOUNTER — Ambulatory Visit: Payer: BC Managed Care – PPO | Admitting: Family Medicine

## 2019-11-22 ENCOUNTER — Other Ambulatory Visit: Payer: Self-pay

## 2019-11-22 ENCOUNTER — Encounter: Payer: Self-pay | Admitting: Family Medicine

## 2019-11-22 ENCOUNTER — Ambulatory Visit (INDEPENDENT_AMBULATORY_CARE_PROVIDER_SITE_OTHER): Payer: BC Managed Care – PPO | Admitting: Family Medicine

## 2019-11-22 VITALS — BP 134/69 | HR 84 | Temp 98.3°F | Ht 69.0 in | Wt 234.0 lb

## 2019-11-22 DIAGNOSIS — R7303 Prediabetes: Secondary | ICD-10-CM

## 2019-11-22 DIAGNOSIS — E119 Type 2 diabetes mellitus without complications: Secondary | ICD-10-CM | POA: Diagnosis not present

## 2019-11-22 DIAGNOSIS — E0590 Thyrotoxicosis, unspecified without thyrotoxic crisis or storm: Secondary | ICD-10-CM

## 2019-11-22 DIAGNOSIS — R42 Dizziness and giddiness: Secondary | ICD-10-CM

## 2019-11-22 DIAGNOSIS — Z09 Encounter for follow-up examination after completed treatment for conditions other than malignant neoplasm: Secondary | ICD-10-CM

## 2019-11-22 DIAGNOSIS — I10 Essential (primary) hypertension: Secondary | ICD-10-CM | POA: Diagnosis not present

## 2019-11-22 DIAGNOSIS — J302 Other seasonal allergic rhinitis: Secondary | ICD-10-CM

## 2019-11-22 DIAGNOSIS — F419 Anxiety disorder, unspecified: Secondary | ICD-10-CM

## 2019-11-22 DIAGNOSIS — R7309 Other abnormal glucose: Secondary | ICD-10-CM

## 2019-11-22 HISTORY — DX: Dizziness and giddiness: R42

## 2019-11-22 HISTORY — DX: Other seasonal allergic rhinitis: J30.2

## 2019-11-22 LAB — POCT URINALYSIS DIPSTICK
Bilirubin, UA: NEGATIVE
Blood, UA: NEGATIVE
Glucose, UA: NEGATIVE
Ketones, UA: NEGATIVE
Leukocytes, UA: NEGATIVE
Nitrite, UA: NEGATIVE
Protein, UA: NEGATIVE
Spec Grav, UA: 1.025 (ref 1.010–1.025)
Urobilinogen, UA: 0.2 E.U./dL
pH, UA: 5.5 (ref 5.0–8.0)

## 2019-11-22 NOTE — Progress Notes (Signed)
Patient Kellie Shaffer  Sick Visit  Subjective:  Patient ID: Kellie Shaffer, female    DOB: [birth year]  Age: 29 y.o. MRN: [id number]  CC:  Chief Complaint  Patient presents with  . Dizziness    HPI Kellie Shaffer is a 29 year old female who presents for Sick Visit today.   Past Medical History:  Diagnosis Date  . Abnormal Pap smear of cervix 2019   LSIL  . Abortion May 2013  . Anemia   . Anxiety   . Diabetes mellitus without complication ([place])   . Dizziness 11/22/2019  . History of ITP   . History of palpitations    Cardiac event monitor June 2019: Normal.  Sinus rhythm no arrhythmias or significant PACs/PVCs.  Marland Kitchen Hypercholesteremia   . Hypertension   . Hyperthyroidism   . Iron deficiency anemia 09/04/2016  . Peripheral vertigo   . Seasonal allergies 11/22/2019  . Vertigo 11/22/2019   Current Status: Since her last office visit, she has c/o recurrent episodes of Vertigo and is currently missing day at work. She has noticed that she has more frequency of spells since seasonal allergies. She denies visual changes, chest pain, cough, shortness of breath, heart palpitations, and falls. She has occasional headaches and dizziness with position changes. Denies severe headaches, confusion, seizures, double vision, and blurred vision, nausea and vomiting. She has not been checking blood glucose levels regularly. She has seen low range of 120 and high of 234 since his last office visit. She denies fatigue, frequent urination, blurred vision, excessive hunger, excessive thirst, weight gain, weight loss, and poor wound healing. She continues to check her feet regularly.  She denies fevers, chills, recent infections, weight loss, and night sweats. No reports of GI problems such as diarrhea, and constipation. She has no reports of blood in stools, dysuria and hematuria. No depression or anxiety reported today. She denies suicidal ideations,  homicidal ideations, or auditory hallucinations. She is taking all medications as prescribed. She denies pain today.   Past Surgical History:  Procedure Laterality Date  . LAPAROSCOPIC APPENDECTOMY N/A 01/14/2017   Procedure: APPENDECTOMY LAPAROSCOPIC;  Surgeon: Fanny Skates, MD;  Location: [facility];  Service: General;  Laterality: N/A;    Family History  Problem Relation Age of Onset  . Diabetes Other        Parent  . Hypertension Other        Parent  . Hyperlipidemia Other        Parent  . Arthritis Other        Parent  . Breast cancer Mother 2    Social History   Socioeconomic History  . Marital status: Single    Spouse name: Not on file  . Number of children: Not on file  . Years of education: 19  . Highest education level: Not on file  Occupational History  . Occupation: [employer]  Tobacco Use  . Smoking status: Former Smoker    Packs/day: 0.25    Types: Cigarettes  . Smokeless tobacco: Former Systems developer    Quit date: 08/17/2019  Substance and Sexual Activity  . Alcohol use: Not Currently  . Drug use: No  . Sexual activity: Yes    Partners: Male    Birth control/protection: Condom, Pill  Other Topics Concern  . Not on file  Social History Narrative   Regular exercise-yes   Caffeine Use-yes         Social Determinants of Health  Financial Resource Strain:   . Difficulty of Paying Living Expenses:   Food Insecurity:   . Worried About Charity fundraiser in the Last Year:   . Arboriculturist in the Last Year:   Transportation Needs:   . Film/video editor (Medical):   Marland Kitchen Lack of Transportation (Non-Medical):   Physical Activity:   . Days of Exercise per Week:   . Minutes of Exercise per Session:   Stress:   . Feeling of Stress :   Social Connections:   . Frequency of Communication with Friends and Family:   . Frequency of Social Gatherings with Friends and Family:   . Attends Religious Services:   . Active Member of Clubs or Organizations:    . Attends Archivist Meetings:   Marland Kitchen Marital Status:   Intimate Partner Violence:   . Fear of Current or Ex-Partner:   . Emotionally Abused:   Marland Kitchen Physically Abused:   . Sexually Abused:     Outpatient Medications Prior to Visit  Medication Sig Dispense Refill  . Acetaminophen (TYLENOL PO) Take by mouth.    Marland Kitchen atorvastatin (LIPITOR) 40 MG tablet TAKE 1 TABLET DAILY 90 tablet 3  . Biotin 10000 MCG TABS Take 1,000 mcg by mouth daily.     . busPIRone (BUSPAR) 10 MG tablet TAKE 1 TABLET DAILY 90 tablet 3  . cetirizine (ZYRTEC) 10 MG tablet Take 1 tablet (10 mg total) by mouth daily as needed for allergies. 90 tablet 1  . esomeprazole (NEXIUM) 40 MG capsule TAKE 1 CAPSULE(40 MG) BY MOUTH DAILY 30 capsule 3  . famotidine (PEPCID) 20 MG tablet TAKE 1 TABLET BY MOUTH TWICE DAILY AS NEEDED FOR HEARTBURN/INDIGESTION 60 tablet 3  . glipiZIDE (GLUCOTROL) 10 MG tablet TAKE 1 TABLET(10 MG) BY MOUTH TWICE DAILY BEFORE A MEAL 60 tablet 3  . IRON PO Take 1 tablet by mouth daily.     Marland Kitchen lisinopril (ZESTRIL) 5 MG tablet TAKE 1 TABLET DAILY 90 tablet 3  . meclizine (ANTIVERT) 25 MG tablet TAKE 1 TABLET(25 MG) BY MOUTH THREE TIMES DAILY AS NEEDED FOR DIZZINESS 30 tablet 3  . metFORMIN (GLUCOPHAGE) 500 MG tablet Take 1 tablet (500 mg total) by mouth 2 (two) times daily with a meal. 180 tablet 3  . methimazole (TAPAZOLE) 10 MG tablet TAKE 1 TABLET DAILY 90 tablet 3  . metoprolol tartrate (LOPRESSOR) 25 MG tablet Take 0.5 tablets (12.5 mg total) by mouth every morning. 90 tablet 2  . Multiple Vitamins-Minerals (MULTIVITAMIN ADULT PO) Take by mouth daily.    . norethindrone (MICRONOR) 0.35 MG tablet Take 1 tablet (0.35 mg total) by mouth daily. 3 Package 4   No facility-administered medications prior to visit.    Allergies  Allergen Reactions  . Benadryl [Diphenhydramine Hcl] Anaphylaxis and Hives  . Chloroxine Hives    Clorox    ROS Review of Systems  Constitutional: Negative.   HENT: Negative.    Eyes: Negative.   Respiratory: Negative.   Cardiovascular: Negative.   Gastrointestinal: Positive for abdominal distention.  Endocrine: Negative.   Genitourinary: Negative.   Musculoskeletal: Negative.   Skin: Negative.   Allergic/Immunologic: Negative.   Neurological: Positive for dizziness (occasional ) and headaches (occasional ).  Hematological: Negative.   Psychiatric/Behavioral: Negative.       Objective:    Physical Exam  Constitutional: She is oriented to person, place, and time. She appears well-developed and well-nourished.  HENT:  Head: Normocephalic and atraumatic.  Eyes: Conjunctivae  are normal.  Cardiovascular: Normal rate, regular rhythm, normal heart sounds and intact distal pulses.  Pulmonary/Chest: Effort normal and breath sounds normal.  Abdominal: Soft. Bowel sounds are normal. She exhibits distension (obese).  Musculoskeletal:        General: Normal range of motion.     Cervical back: Normal range of motion and neck supple.  Neurological: She is alert and oriented to person, place, and time. She has normal reflexes.  Skin: Skin is warm and dry.  Psychiatric: She has a normal mood and affect. Her behavior is normal. Judgment and thought content normal.  Nursing note and vitals reviewed.   BP 134/69   Pulse 84   Temp 98.3 F (36.8 C) (Oral)   Ht '5\' 9"'  (1.753 m)   Wt 234 lb (106.1 kg)   LMP 11/05/2019 (LMP Unknown)   SpO2 98%   BMI 34.56 kg/m  Wt Readings from Last 3 Encounters:  11/22/19 234 lb (106.1 kg)  09/27/19 238 lb (108 kg)  09/20/19 238 lb (108 kg)     Health Maintenance Due  Topic Date Due  . PNEUMOCOCCAL POLYSACCHARIDE VACCINE AGE 58-64 HIGH RISK  Never done  . FOOT EXAM  09/10/2018    There are no preventive Shaffer reminders to display for this patient.  Lab Results  Component Value Date   TSH 3.77 09/20/2019   Lab Results  Component Value Date   WBC 8.2 06/21/2019   HGB 12.4 06/21/2019   HCT 37.9 06/21/2019   MCV 85.0  06/21/2019   PLT 203 06/21/2019   Lab Results  Component Value Date   NA 138 09/20/2019   K 3.7 09/20/2019   CHLORIDE 95 (L) 08/13/2017   CO2 29 09/20/2019   GLUCOSE 83 09/20/2019   BUN 13 09/20/2019   CREATININE 0.85 09/20/2019   BILITOT 0.4 06/21/2019   ALKPHOS 78 06/21/2019   AST 12 (L) 06/21/2019   ALT 32 06/21/2019   PROT 8.3 (H) 06/21/2019   ALBUMIN 4.3 06/21/2019   CALCIUM 10.0 09/20/2019   ANIONGAP 12 06/21/2019   EGFR >60 08/13/2017   GFR 95.33 09/20/2019   Lab Results  Component Value Date   CHOL 216 (H) 08/31/2017   Lab Results  Component Value Date   HDL 35 (L) 08/31/2017   Lab Results  Component Value Date   LDLCALC 168 (H) 08/31/2017   Lab Results  Component Value Date   TRIG 67 08/31/2017   Lab Results  Component Value Date   CHOLHDL 6.2 08/31/2017   Lab Results  Component Value Date   HGBA1C 7.2 (A) 09/27/2019      Assessment & Plan:   1. Vertigo Stable.  - Ambulatory referral to ENT  2. Dizziness - Ambulatory referral to ENT  3. Hypertension, unspecified type The current medical regimen is effective; blood pressure is stable at 134/69 today; continue present plan and medications as prescribed. She will continue to take medications as prescribed, to decrease high sodium intake, excessive alcohol intake, increase potassium intake, smoking cessation, and increase physical activity of at least 30 minutes of cardio activity daily. She will continue to follow Heart Healthy or DASH diet. - POCT urinalysis dipstick  4. Type 2 diabetes mellitus without complication, without long-term current use of insulin (HCC) She will continue medication as prescribed, to decrease foods/beverages high in sugars and carbs and follow Heart Healthy or DASH diet. Increase physical activity to at least 30 minutes cardio exercise daily.   5. Hemoglobin A1C between 7% and  9% indicating borderline diabetic control She will continue to follow up with Endocrinologist  as needed.   6. Elevated glucose  7. Hyperthyroidism  8. Anxiety Stable today.   9. Seasonal allergies - Ambulatory referral to ENT  10. Follow up She will keep follow up appointment.   No orders of the defined types were placed in this encounter.   Orders Placed This Encounter  Procedures  . Ambulatory referral to ENT  . POCT urinalysis dipstick     Referral Orders     Ambulatory referral to ENT   Kathe Becton,  MSN, FNP-BC [address], [place] [id number] [phone]- fax  Problem List Items Addressed This Visit      Cardiovascular and Mediastinum   Hypertension   Relevant Orders   POCT urinalysis dipstick (Completed)     Endocrine   Hemoglobin A1C between 7% and 9% indicating borderline diabetic control   Hyperthyroidism (Chronic)   Type 2 diabetes mellitus without complication, without long-term current use of insulin (HCC)     Other   Anxiety   Dizziness   Relevant Orders   Ambulatory referral to ENT    Other Visit Diagnoses    Vertigo    -  Primary   Relevant Orders   Ambulatory referral to ENT   Elevated glucose       Seasonal allergies       Relevant Orders   Ambulatory referral to ENT   Follow up          No orders of the defined types were placed in this encounter.   Follow-up: No follow-ups on file.    Azzie Glatter, FNP

## 2019-11-23 ENCOUNTER — Encounter: Payer: Self-pay | Admitting: Family Medicine

## 2019-11-23 DIAGNOSIS — J302 Other seasonal allergic rhinitis: Secondary | ICD-10-CM | POA: Insufficient documentation

## 2019-11-29 ENCOUNTER — Ambulatory Visit: Payer: BC Managed Care – PPO | Admitting: Family Medicine

## 2019-12-04 ENCOUNTER — Telehealth (HOSPITAL_COMMUNITY): Payer: Self-pay

## 2019-12-04 NOTE — Telephone Encounter (Signed)
Kellie Shaffer for Duty forms faxed.

## 2019-12-10 IMAGING — CT CT HEAD W/O CM
3 series · 15 of 47 positions shown, 18 images · non-contrast
Comparison: None.

CLINICAL DATA: Headache, chronic, neuro deficit

EXAM:
CT HEAD WITHOUT CONTRAST
TECHNIQUE: Contiguous axial images were obtained from the base of the skull
through the vertex without intravenous contrast.

[Series 2: head wo · axial · 0.42mm/px · z∈[-214,-84]mm · 9 of 32 slices shown, 12 images]
[im 3/32  brain]
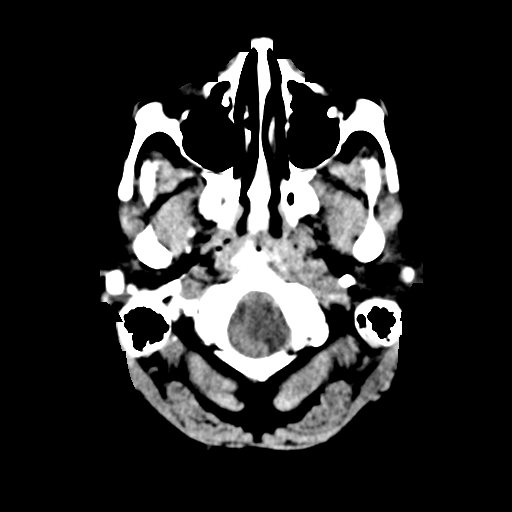
[im 3/32  bone]
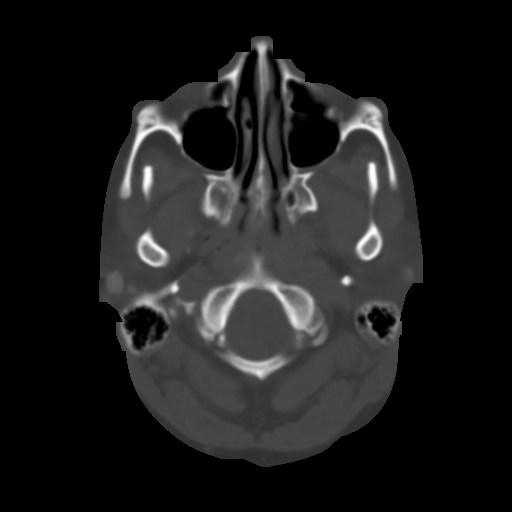
[im 6/32  brain]
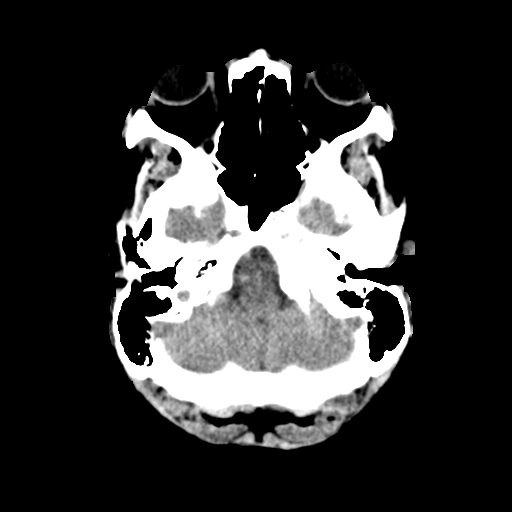
[im 9/32  brain]
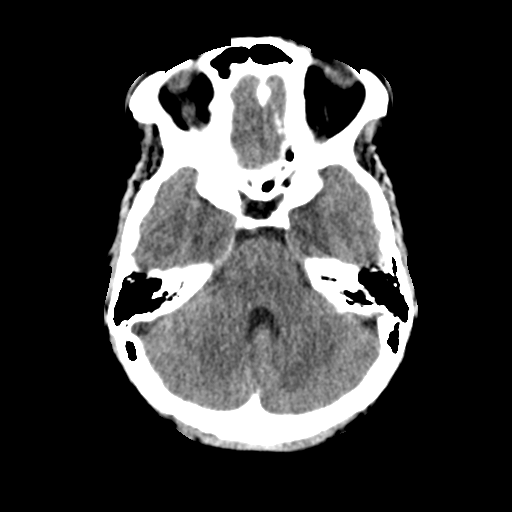
[im 12/32  brain]
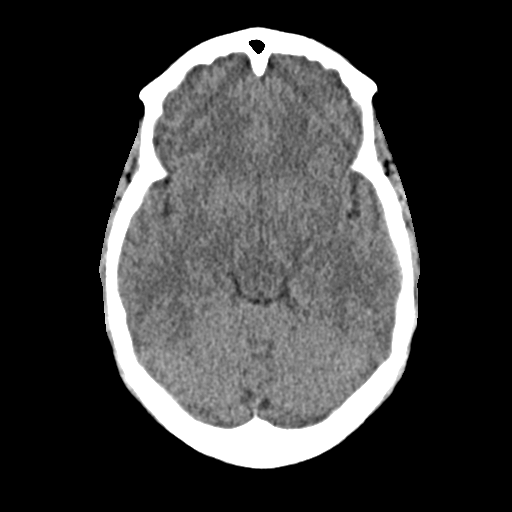
[im 17/32  brain]
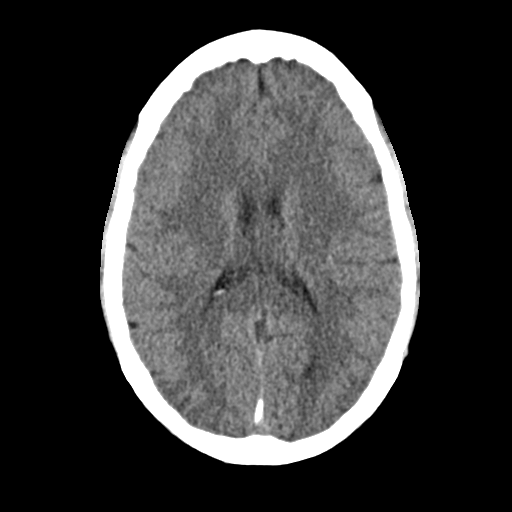
[im 17/32  bone]
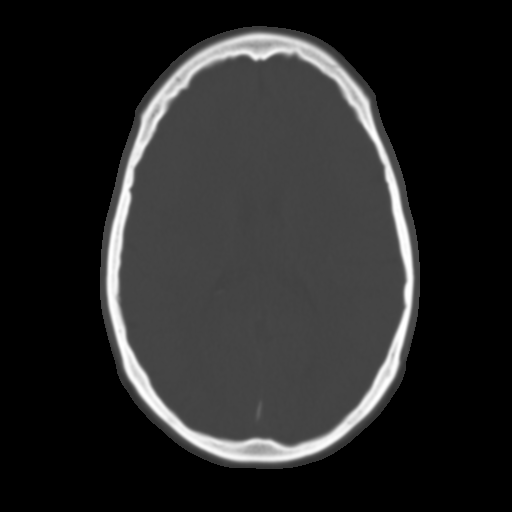
[im 20/32  brain]
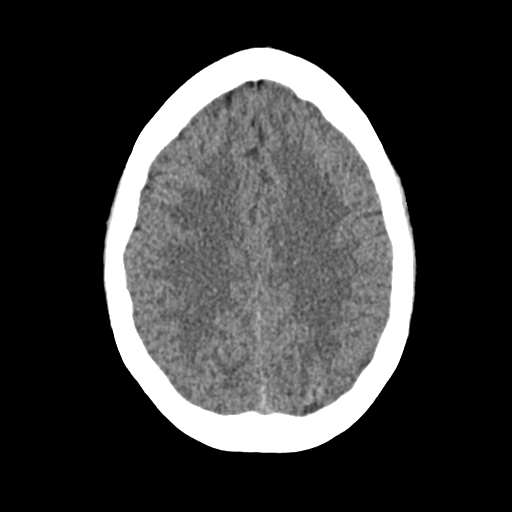
[im 23/32  brain]
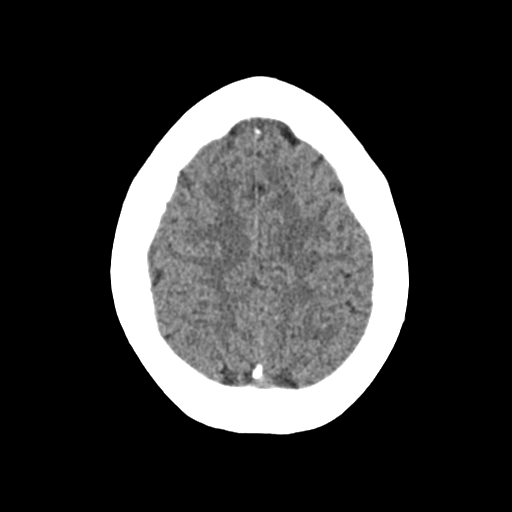
[im 26/32  brain]
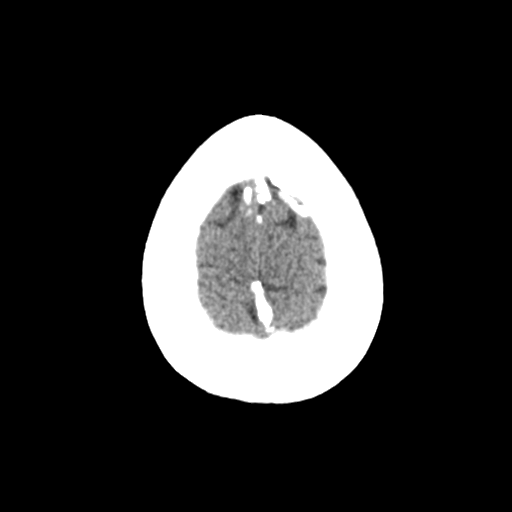
[im 29/32  brain]
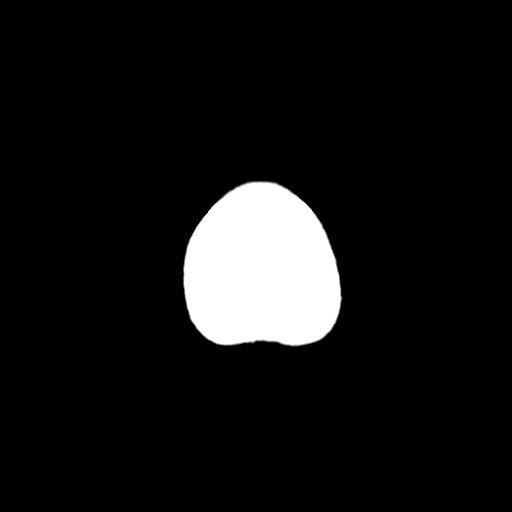
[im 29/32  bone]
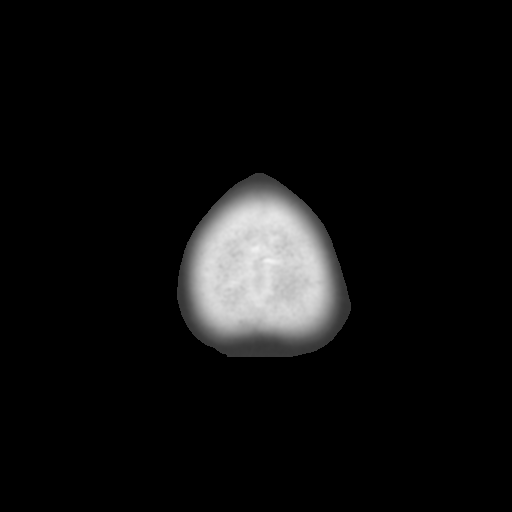

[Series 4: coronal soft · coronal · 0.30mm/px · 3 of 67 slices shown]
[im 23/67  brain]
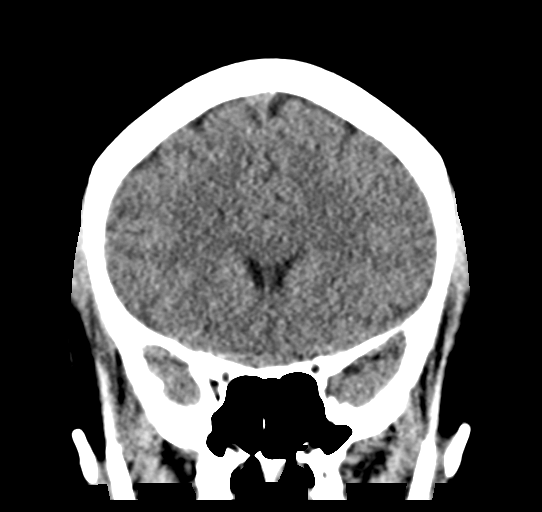
[im 30/67  brain]
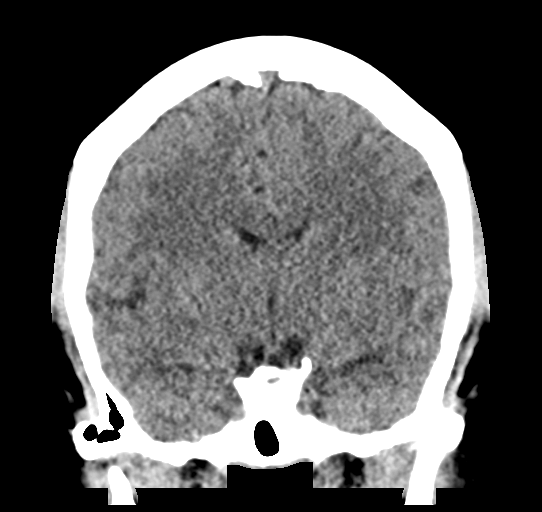
[im 37/67  brain]
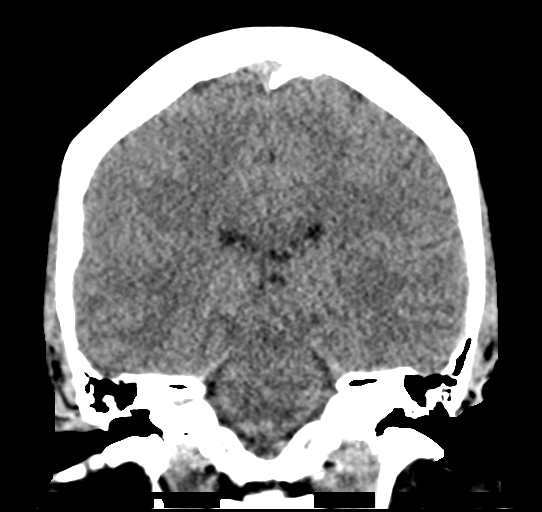

[Series 5: sag soft · sagittal · 0.31mm/px · 3 of 52 slices shown]
[im 18/52  brain]
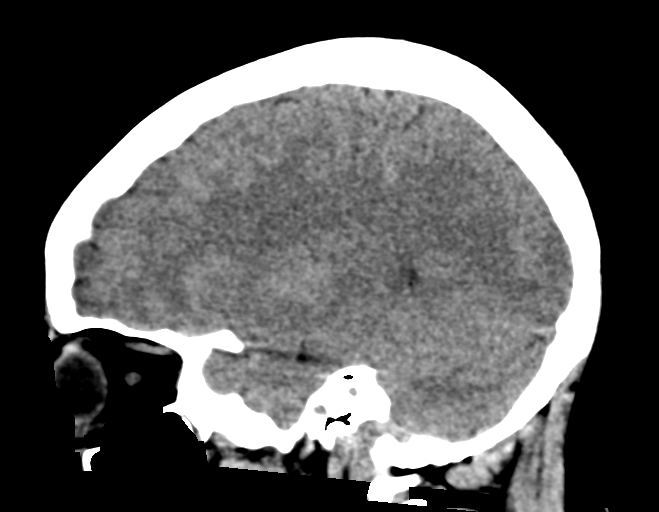
[im 26/52  brain]
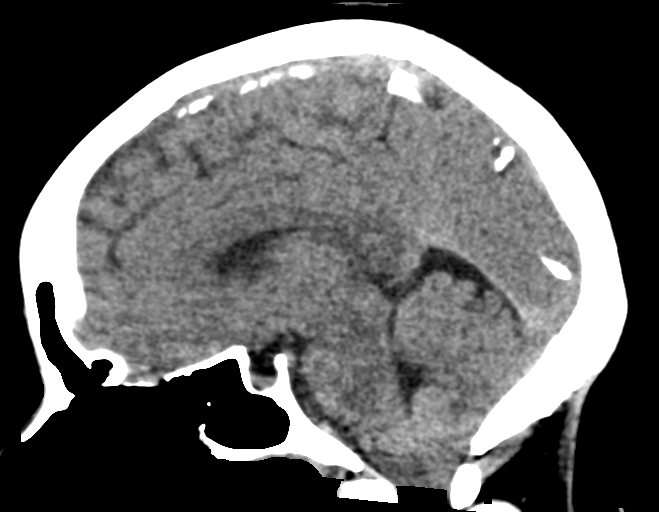
[im 35/52  brain]
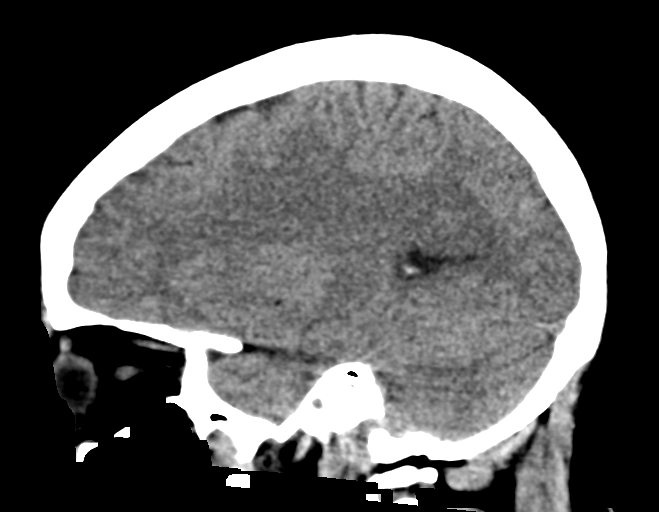

[15 of 47 positions shown; findings below may reference images not displayed]

FINDINGS: Brain: No intracranial hemorrhage, mass effect, or midline shift.
The cerebellar tonsils extend approximately 12 mm beyond the foramen
magnum with crowding of the basilar cisterns. No hydrocephalus. No
evidence of territorial infarct or acute ischemia. No extra-axial or
intracranial fluid collection.

Vascular: No hyperdense vessel or unexpected calcification.

Skull: No fracture or focal lesion.

Sinuses/Orbits: Minimal bubbly debris in right frontal sinus. No
mucosal thickening or fluid levels. Mastoid air cells are clear.
Visualized orbits are unremarkable.

Other: None.
IMPRESSION: 1.  No acute intracranial abnormality.
2. Cerebellar tonsils extend 12 mm below the foramen magnum with
crowding of the basilar cisterns as can be seen with Chiari 1
malformation. Recommend brain MRI for further evaluation.

## 2019-12-13 ENCOUNTER — Other Ambulatory Visit: Payer: Self-pay

## 2019-12-13 ENCOUNTER — Encounter (INDEPENDENT_AMBULATORY_CARE_PROVIDER_SITE_OTHER): Payer: Self-pay | Admitting: Otolaryngology

## 2019-12-13 ENCOUNTER — Ambulatory Visit (INDEPENDENT_AMBULATORY_CARE_PROVIDER_SITE_OTHER): Payer: BC Managed Care – PPO | Admitting: Otolaryngology

## 2019-12-13 VITALS — Temp 97.2°F

## 2019-12-13 DIAGNOSIS — H6121 Impacted cerumen, right ear: Secondary | ICD-10-CM | POA: Diagnosis not present

## 2019-12-13 DIAGNOSIS — J301 Allergic rhinitis due to pollen: Secondary | ICD-10-CM | POA: Diagnosis not present

## 2019-12-13 DIAGNOSIS — R42 Dizziness and giddiness: Secondary | ICD-10-CM | POA: Diagnosis not present

## 2019-12-13 NOTE — Progress Notes (Signed)
HPI: Kellie Shaffer is a 29 y.o. female who presents is referred by her PCP for evaluation of vertigo. She apparently initially was diagnosed with vertigo last summer. She was treated with meclizine and was taking meclizine regularly for several months. She did well over the winter months but with spring and pollen she seemed to get more episodes of dizziness and vertigo. She takes Zyrtec on a regular basis. She does not have that much nasal congestion. The episodes of dizziness or vertigo may last anywhere from several minutes to several hours. She has a sensation that things are moving. She has not noted any change in her hearing and does not complain of any hearing problems. She has no ear discomfort..  Past Medical History:  Diagnosis Date  . Abnormal Pap smear of cervix 2019   LSIL  . Abortion May 2013  . Anemia   . Anxiety   . Diabetes mellitus without complication ([place])   . Dizziness 11/22/2019  . History of ITP   . History of palpitations    Cardiac event monitor June 2019: Normal.  Sinus rhythm no arrhythmias or significant PACs/PVCs.  Marland Kitchen Hypercholesteremia   . Hypertension   . Hyperthyroidism   . Iron deficiency anemia 09/04/2016  . Peripheral vertigo   . Seasonal allergies 11/22/2019  . Vertigo 11/22/2019   Past Surgical History:  Procedure Laterality Date  . LAPAROSCOPIC APPENDECTOMY N/A 01/14/2017   Procedure: APPENDECTOMY LAPAROSCOPIC;  Surgeon: Fanny Skates, MD;  Location: [facility];  Service: General;  Laterality: N/A;   Social History   Socioeconomic History  . Marital status: Single    Spouse name: Not on file  . Number of children: Not on file  . Years of education: 43  . Highest education level: Not on file  Occupational History  . Occupation: [employer]  Tobacco Use  . Smoking status: Former Smoker    Packs/day: 0.25    Years: 10.00    Pack years: 2.50    Types: Cigarettes    Start date: 2011    Quit date: 2021    Years since quitting: 0.3   . Smokeless tobacco: Never Used  Substance and Sexual Activity  . Alcohol use: Not Currently  . Drug use: No  . Sexual activity: Yes    Partners: Male    Birth control/protection: Condom, Pill  Other Topics Concern  . Not on file  Social History Narrative   Regular exercise-yes   Caffeine Use-yes         Social Determinants of Health   Financial Resource Strain:   . Difficulty of Paying Living Expenses:   Food Insecurity:   . Worried About Charity fundraiser in the Last Year:   . Arboriculturist in the Last Year:   Transportation Needs:   . Film/video editor (Medical):   Marland Kitchen Lack of Transportation (Non-Medical):   Physical Activity:   . Days of Exercise per Week:   . Minutes of Exercise per Session:   Stress:   . Feeling of Stress :   Social Connections:   . Frequency of Communication with Friends and Family:   . Frequency of Social Gatherings with Friends and Family:   . Attends Religious Services:   . Active Member of Clubs or Organizations:   . Attends Archivist Meetings:   Marland Kitchen Marital Status:    Family History  Problem Relation Age of Onset  . Diabetes Other        Parent  .  Hypertension Other        Parent  . Hyperlipidemia Other        Parent  . Arthritis Other        Parent  . Breast cancer Mother 6   Allergies  Allergen Reactions  . Benadryl [Diphenhydramine Hcl] Anaphylaxis and Hives  . Chloroxine Hives    Clorox   Prior to Admission medications   Medication Sig Start Date End Date Taking? Authorizing Provider  Acetaminophen (TYLENOL PO) Take by mouth.   Yes [provider]  atorvastatin (LIPITOR) 40 MG tablet TAKE 1 TABLET DAILY 10/16/19  Yes [name], FNP  Biotin 78295 MCG TABS Take 1,000 mcg by mouth daily.    Yes [provider]  busPIRone (BUSPAR) 10 MG tablet TAKE 1 TABLET DAILY 07/07/19  Yes [name], FNP  cetirizine (ZYRTEC) 10 MG tablet Take 1 tablet (10 mg total) by mouth daily as needed  for allergies. 09/10/17  Yes Bing Neighbors, FNP  esomeprazole (NEXIUM) 40 MG capsule TAKE 1 CAPSULE(40 MG) BY MOUTH DAILY 06/08/19  Yes [name], FNP  famotidine (PEPCID) 20 MG tablet TAKE 1 TABLET BY MOUTH TWICE DAILY AS NEEDED FOR HEARTBURN/INDIGESTION 10/16/19  Yes [name], FNP  glipiZIDE (GLUCOTROL) 10 MG tablet TAKE 1 TABLET(10 MG) BY MOUTH TWICE DAILY BEFORE A MEAL 10/13/19  Yes [name], FNP  IRON PO Take 1 tablet by mouth daily.    Yes [provider]  lisinopril (ZESTRIL) 5 MG tablet TAKE 1 TABLET DAILY 07/17/19  Yes [name], FNP  meclizine (ANTIVERT) 25 MG tablet TAKE 1 TABLET(25 MG) BY MOUTH THREE TIMES DAILY AS NEEDED FOR DIZZINESS 06/26/19  Yes [name], FNP  metFORMIN (GLUCOPHAGE) 500 MG tablet Take 1 tablet (500 mg total) by mouth 2 (two) times daily with a meal. 05/29/19  Yes [name], FNP  methimazole (TAPAZOLE) 10 MG tablet TAKE 1 TABLET DAILY 08/31/19  Yes Romero Belling, MD  metoprolol tartrate (LOPRESSOR) 25 MG tablet Take 0.5 tablets (12.5 mg total) by mouth every morning. 10/12/18  Yes [name], FNP  Multiple Vitamins-Minerals (MULTIVITAMIN ADULT PO) Take by mouth daily.   Yes [provider]  norethindrone (MICRONOR) 0.35 MG tablet Take 1 tablet (0.35 mg total) by mouth daily. 01/12/19  Yes Jerene Bears, MD     Positive ROS: Otherwise negative  All other systems have been reviewed and were otherwise negative with the exception of those mentioned in the HPI and as above.  Physical Exam: Constitutional: Alert, well-appearing, no acute distress Ears: External ears without lesions or tenderness. She has a mild amount of wax down in the right ear canal obstructing the visualization of the TM. This was removed with a curette. The left ear canal had minimal cerumen buildup that was removed with a curette. Both TMs were clear with good mobility on pneumatic otoscopy and no middle ear abnormality  noted. Hearing screening with tuning forks revealed symmetric hearing. Dix-Hallpike testing revealed no evidence of BPPV. She apparently uses Q-tips to clean her ears. I cautioned her about not using Q-tips down in the ear canal. Nasal: External nose without lesions. Septum midline with only mild rhinitis.. Clear nasal passages bilaterally with clear middle meatus bilaterally. Oral: Lips and gums without lesions. Tongue and palate mucosa without lesions. Posterior oropharynx clear. Neck: No palpable adenopathy or masses Respiratory: Breathing comfortably  Skin: No facial/neck lesions or rash noted.  Cerumen impaction removal  Date/Time:  12/13/2019 9:43 AM Performed by: Drema Halon, MD Authorized by: Drema Halon, MD   Consent:    Consent obtained:  Verbal   Consent given by:  Patient   Risks discussed:  Pain and bleeding Procedure details:    Location:  R ear   Procedure type: curette   Post-procedure details:    Inspection:  TM intact and canal normal   Hearing quality:  Improved   Patient tolerance of procedure:  Tolerated well, no immediate complications Comments:     TMs are clear bilaterally    Assessment: History of dizziness and vertigo. Allergic rhinitis  Plan: We will plan on scheduling VNG testing and audiologic testing to see if she has any inner ear abnormality. I would recommend follow-up with allergist concerning evaluation and treatment of her allergies that may be contributing to the dizziness. She will follow-up following VNG and audiologic testing.   Narda Bonds, MD   CC:

## 2019-12-15 ENCOUNTER — Telehealth: Payer: Self-pay | Admitting: Obstetrics & Gynecology

## 2019-12-15 NOTE — Telephone Encounter (Signed)
Spoke with patient. Patient is requesting an OV to determine if yeast symptoms have resolved. Patient reports vaginal irritation for 1.5 wks, has used OTC monistat 1 day, no change. Denies vaginal d/c, odor, itching. Changed to a new feminine wash recently.   Patient is on POP. Reports menses is 11 days late, several UPT negative at home, last one taken today. Denies any missed or late pills, takes roughly same time daily. SA. Denies pelvic pain, fever/chills, N/V.   OV scheduled for 5/6 at 4:30pm with Dr. Hyacinth Meeker. Patient declines earlier appts due to her work schedule. Patient is aware to contact office if any new symptoms develop. Patient verbalizes understanding and is agreeable.   Last AEX 01/12/19  Routing to provider for final review. Patient is agreeable to disposition. Will close encounter.

## 2019-12-15 NOTE — Telephone Encounter (Signed)
Patient thinks she has a yeast infection and tried otc medication. To triage to assist with scheduling.

## 2019-12-18 NOTE — Progress Notes (Signed)
GYNECOLOGY  VISIT  CC:   Skipped cycles  HPI: 29 y.o. G1P0010 Single Black or [place] American female here for two issues.  First, she is on micronor but has skipped her last tow cycles.  This has never happened before and she wanted to make sure everything as okay.  She denies breast tenderness, nausea.  UPT here today was negative.    As well, she's been having vaginal discomfort with some mild discharge.  She used monistat for 7 days.  On the 7th day, she was SA.  This is not a new partner--has been with this person about 18 months.  Uses condoms faithfully but symptoms started right back.  Denies any vaginal odor.  Felt she needed to have testing.   GYNECOLOGIC HISTORY: Patient's last menstrual period was 11/05/2019 (lmp unknown). Contraception: micronor Menopausal hormone therapy: none upt-neg  Patient Active Problem List   Diagnosis Date Noted  . Seasonal allergies 11/23/2019  . Hemoglobin A1C between 7% and 9% indicating borderline diabetic control 09/28/2019  . Diarrhea 05/29/2019  . Dizziness 05/29/2019  . Type 2 diabetes mellitus without complication, without long-term current use of insulin (HCC) 11/09/2018  . Hypertension 11/09/2018  . Anxiety 11/09/2018  . 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 CIN II (cervical intraepithelial neoplasia II) 07/28/2018  . 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 Palpitations 01/12/2018  . Elevated troponin 01/12/2018  . DKA (diabetic ketoacidoses) (HCC) 08/27/2017  . Hyperglycemia 08/13/2017  . Iron deficiency anemia 09/04/2016  . Hyperthyroidism 07/05/2014  . Thrombocytopenia (HCC) 05/27/2014  . Hypercholesteremia     Past Medical History:  Diagnosis Date  . Abnormal Pap smear of cervix 2019   LSIL  . Abortion May 2013  . Anemia   . Anxiety   . Diabetes mellitus without complication (HCC)   . Dizziness 11/22/2019  . History of ITP   . History of palpitations     Cardiac event monitor June 2019: Normal.  Sinus rhythm no arrhythmias or significant PACs/PVCs.  Marland Kitchen Hypercholesteremia   . Hypertension   . Hyperthyroidism   . Iron deficiency anemia 09/04/2016  . Peripheral vertigo   . Seasonal allergies 11/22/2019  . Vertigo 11/22/2019    Past Surgical History:  Procedure Laterality Date  . LAPAROSCOPIC APPENDECTOMY N/A 01/14/2017   Procedure: APPENDECTOMY LAPAROSCOPIC;  Surgeon: Claud Kelp, MD;  Location: [facility];  Service: General;  Laterality: N/A;    MEDS:   Current Outpatient Medications on File Prior to Visit  Medication Sig Dispense Refill  . Acetaminophen (TYLENOL PO) Take by mouth.    Marland Kitchen atorvastatin (LIPITOR) 40 MG tablet TAKE 1 TABLET DAILY 90 tablet 3  . Biotin 16109 MCG TABS Take 1,000 mcg by mouth daily.     . busPIRone (BUSPAR) 10 MG tablet TAKE 1 TABLET DAILY 90 tablet 3  . cetirizine (ZYRTEC) 10 MG tablet Take 1 tablet (10 mg total) by mouth daily as needed for allergies. 90 tablet 1  . esomeprazole (NEXIUM) 40 MG capsule TAKE 1 CAPSULE(40 MG) BY MOUTH DAILY 30 capsule 3  . famotidine (PEPCID) 20 MG tablet TAKE 1 TABLET BY MOUTH TWICE DAILY AS NEEDED FOR HEARTBURN/INDIGESTION 60 tablet 3  . glipiZIDE (GLUCOTROL) 10 MG tablet TAKE 1 TABLET(10 MG) BY MOUTH TWICE DAILY BEFORE A MEAL 60 tablet 3  . IRON PO Take 1 tablet by mouth daily.     Marland Kitchen lisinopril (ZESTRIL) 5 MG tablet TAKE 1 TABLET DAILY 90 tablet 3  .  meclizine (ANTIVERT) 25 MG tablet TAKE 1 TABLET(25 MG) BY MOUTH THREE TIMES DAILY AS NEEDED FOR DIZZINESS 30 tablet 3  . metFORMIN (GLUCOPHAGE) 500 MG tablet Take 1 tablet (500 mg total) by mouth 2 (two) times daily with a meal. 180 tablet 3  . methimazole (TAPAZOLE) 10 MG tablet TAKE 1 TABLET DAILY 90 tablet 3  . metoprolol tartrate (LOPRESSOR) 25 MG tablet Take 0.5 tablets (12.5 mg total) by mouth every morning. 90 tablet 2  . Multiple Vitamins-Minerals (MULTIVITAMIN ADULT PO) Take by mouth daily.    . norethindrone (MICRONOR)  0.35 MG tablet Take 1 tablet (0.35 mg total) by mouth daily. 3 Package 4  . omeprazole (PRILOSEC) 40 MG capsule Take 40 mg by mouth daily.     No current facility-administered medications on file prior to visit.    ALLERGIES: Benadryl [diphenhydramine hcl] and Chloroxine  Family History  Problem Relation Age of Onset  . Diabetes Other        Parent  . Hypertension Other        Parent  . Hyperlipidemia Other        Parent  . Arthritis Other        Parent  . Breast cancer Mother 50    SH:  Single, former smoker (quit earlier this year)  Review of Systems  Constitutional: Negative.   HENT: Negative.   Eyes: Negative.   Respiratory: Negative.   Cardiovascular: Negative.   Gastrointestinal: Negative.   Endocrine: Negative.   Genitourinary:       Amenorrhea & vaginal discomfort  Musculoskeletal: Negative.   Skin: Negative.   Allergic/Immunologic: Negative.   Neurological: Negative.   Psychiatric/Behavioral: Negative.     PHYSICAL EXAMINATION:    BP 134/82   Pulse 68   Temp 97.6 F (36.4 C) (Skin)   Resp 16   Wt 243 lb (110.2 kg)   LMP 11/05/2019 (LMP Unknown)   BMI 35.88 kg/m     General appearance: alert, cooperative and appears stated age Lymph:  no inguinal LAD noted  Pelvic: External genitalia:  no lesions              Urethra:  normal appearing urethra with no masses, tenderness or lesions              Bartholins and Skenes: normal                 Vagina: normal appearing vagina with normal color, watery discharge noted, no lesions              Cervix: no lesions              Bimanual Exam:  Uterus:  normal size, contour, position, consistency, mobility, non-tender              Adnexa: no mass, fullness, tenderness              Rectovaginal: No..  Confirms.              Anus:  no lesions  Chaperone, Royal Hawthorn, CMA, was present for exam.  Assessment: Amenorrhea on micronor with negative UPT today Vaginal irritation H/o anemia  Plan: Vaginitis  testing obtained today Prolactin, FSH obtained (recent TSH normal) Diflucan 150mg  po x 1, repeat 72 hours.  #2/0RF Iron studies obtained today

## 2019-12-19 ENCOUNTER — Ambulatory Visit: Payer: BC Managed Care – PPO | Admitting: Internal Medicine

## 2019-12-19 ENCOUNTER — Other Ambulatory Visit: Payer: BC Managed Care – PPO

## 2019-12-20 ENCOUNTER — Other Ambulatory Visit: Payer: Self-pay

## 2019-12-20 ENCOUNTER — Encounter: Payer: Self-pay | Admitting: Internal Medicine

## 2019-12-20 ENCOUNTER — Inpatient Hospital Stay (HOSPITAL_BASED_OUTPATIENT_CLINIC_OR_DEPARTMENT_OTHER): Payer: BC Managed Care – PPO | Admitting: Internal Medicine

## 2019-12-20 ENCOUNTER — Inpatient Hospital Stay: Payer: BC Managed Care – PPO | Attending: Internal Medicine

## 2019-12-20 VITALS — BP 142/82 | HR 65 | Temp 98.2°F | Resp 18 | Ht 69.0 in | Wt 241.1 lb

## 2019-12-20 DIAGNOSIS — D649 Anemia, unspecified: Secondary | ICD-10-CM | POA: Insufficient documentation

## 2019-12-20 DIAGNOSIS — D696 Thrombocytopenia, unspecified: Secondary | ICD-10-CM | POA: Diagnosis not present

## 2019-12-20 DIAGNOSIS — D693 Immune thrombocytopenic purpura: Secondary | ICD-10-CM | POA: Insufficient documentation

## 2019-12-20 LAB — CBC WITH DIFFERENTIAL (CANCER CENTER ONLY)
Abs Immature Granulocytes: 0.02 10*3/uL (ref 0.00–0.07)
Basophils Absolute: 0 10*3/uL (ref 0.0–0.1)
Basophils Relative: 0 %
Eosinophils Absolute: 0.1 10*3/uL (ref 0.0–0.5)
Eosinophils Relative: 1 %
HCT: 35.4 % — ABNORMAL LOW (ref 36.0–46.0)
Hemoglobin: 11.5 g/dL — ABNORMAL LOW (ref 12.0–15.0)
Immature Granulocytes: 0 %
Lymphocytes Relative: 26 %
Lymphs Abs: 2.1 10*3/uL (ref 0.7–4.0)
MCH: 27.8 pg (ref 26.0–34.0)
MCHC: 32.5 g/dL (ref 30.0–36.0)
MCV: 85.7 fL (ref 80.0–100.0)
Monocytes Absolute: 0.7 10*3/uL (ref 0.1–1.0)
Monocytes Relative: 9 %
Neutro Abs: 5.2 10*3/uL (ref 1.7–7.7)
Neutrophils Relative %: 64 %
Platelet Count: 188 10*3/uL (ref 150–400)
RBC: 4.13 MIL/uL (ref 3.87–5.11)
RDW: 13.4 % (ref 11.5–15.5)
WBC Count: 8.1 10*3/uL (ref 4.0–10.5)
nRBC: 0 % (ref 0.0–0.2)

## 2019-12-20 LAB — CMP (CANCER CENTER ONLY)
ALT: 45 U/L — ABNORMAL HIGH (ref 0–44)
AST: 20 U/L (ref 15–41)
Albumin: 4.1 g/dL (ref 3.5–5.0)
Alkaline Phosphatase: 76 U/L (ref 38–126)
Anion gap: 8 (ref 5–15)
BUN: 7 mg/dL (ref 6–20)
CO2: 24 mmol/L (ref 22–32)
Calcium: 9.8 mg/dL (ref 8.9–10.3)
Chloride: 106 mmol/L (ref 98–111)
Creatinine: 0.77 mg/dL (ref 0.44–1.00)
GFR, Est AFR Am: >60 mL/min (ref >60)
GFR, Estimated: >60 mL/min (ref >60)
Glucose, Bld: 151 mg/dL — ABNORMAL HIGH (ref 70–99)
Potassium: 3.9 mmol/L (ref 3.5–5.1)
Sodium: 138 mmol/L (ref 135–145)
Total Bilirubin: 0.7 mg/dL (ref 0.3–1.2)
Total Protein: 8.2 g/dL — ABNORMAL HIGH (ref 6.5–8.1)

## 2019-12-20 LAB — LACTATE DEHYDROGENASE: LDH: 191 U/L (ref 98–192)

## 2019-12-20 NOTE — Progress Notes (Signed)
[facility] Telephone:(336) [phone]   Fax:(336) [phone]  OFFICE PROGRESS NOTE  [name], FNP [address] 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  CURRENT THERAPY: Observation.  INTERVAL HISTORY: Kellie Shaffer 29 y.o. female returns to the clinic today for follow-up visit.  The patient is feeling fine today with no concerning complaints.  She denied having any bleeding, bruises or ecchymosis.  She denied having any chest pain, shortness of breath, cough or hemoptysis.  She has no fever or chills.  She has no headache or visual changes.  She gained more than 10 pounds in the last 6 months.  The patient is here today for evaluation and repeat blood work.   MEDICAL HISTORY: Past Medical History:  Diagnosis Date  . Abnormal Pap smear of cervix 2019   LSIL  . Abortion May 2013  . Anemia   . Anxiety   . Diabetes mellitus without complication (HCC)   . Dizziness 11/22/2019  . History of ITP   . History of palpitations    Cardiac event monitor June 2019: Normal.  Sinus rhythm no arrhythmias or significant PACs/PVCs.  Marland Kitchen Hypercholesteremia   . Hypertension   . Hyperthyroidism   . Iron deficiency anemia 09/04/2016  . Peripheral vertigo   . Seasonal allergies 11/22/2019  . Vertigo 11/22/2019    ALLERGIES:  is allergic to benadryl [diphenhydramine hcl] and chloroxine.  MEDICATIONS:  Current Outpatient Medications  Medication Sig Dispense Refill  . Acetaminophen (TYLENOL PO) Take by mouth.    Marland Kitchen atorvastatin (LIPITOR) 40 MG tablet TAKE 1 TABLET DAILY 90 tablet 3  . Biotin 82423 MCG TABS Take 1,000 mcg by mouth daily.     . busPIRone (BUSPAR) 10 MG tablet TAKE 1 TABLET DAILY 90 tablet 3  . cetirizine (ZYRTEC) 10 MG tablet Take 1 tablet (10 mg total) by mouth daily as needed for allergies. 90 tablet 1  .  esomeprazole (NEXIUM) 40 MG capsule TAKE 1 CAPSULE(40 MG) BY MOUTH DAILY 30 capsule 3  . famotidine (PEPCID) 20 MG tablet TAKE 1 TABLET BY MOUTH TWICE DAILY AS NEEDED FOR HEARTBURN/INDIGESTION 60 tablet 3  . glipiZIDE (GLUCOTROL) 10 MG tablet TAKE 1 TABLET(10 MG) BY MOUTH TWICE DAILY BEFORE A MEAL 60 tablet 3  . IRON PO Take 1 tablet by mouth daily.     Marland Kitchen lisinopril (ZESTRIL) 5 MG tablet TAKE 1 TABLET DAILY 90 tablet 3  . meclizine (ANTIVERT) 25 MG tablet TAKE 1 TABLET(25 MG) BY MOUTH THREE TIMES DAILY AS NEEDED FOR DIZZINESS 30 tablet 3  . metFORMIN (GLUCOPHAGE) 500 MG tablet Take 1 tablet (500 mg total) by mouth 2 (two) times daily with a meal. 180 tablet 3  . methimazole (TAPAZOLE) 10 MG tablet TAKE 1 TABLET DAILY 90 tablet 3  . metoprolol tartrate (LOPRESSOR) 25 MG tablet Take 0.5 tablets (12.5 mg total) by mouth every morning. 90 tablet 2  . Multiple Vitamins-Minerals (MULTIVITAMIN ADULT PO) Take by mouth daily.    . norethindrone (MICRONOR) 0.35 MG tablet Take 1 tablet (0.35 mg total) by mouth daily. 3 Package 4   No current facility-administered medications for this visit.    SURGICAL HISTORY:  Past Surgical History:  Procedure Laterality Date  . LAPAROSCOPIC APPENDECTOMY N/A 01/14/2017   Procedure: APPENDECTOMY LAPAROSCOPIC;  Surgeon: Claud Kelp, MD;  Location: [facility];  Service: General;  Laterality: N/A;    REVIEW OF SYSTEMS:  A comprehensive review of systems was negative.   PHYSICAL EXAMINATION: General appearance: alert, cooperative, appears stated age and no distress Head: Normocephalic, without obvious abnormality, atraumatic Neck: no adenopathy, no JVD, supple, symmetrical, trachea midline and thyroid not enlarged, symmetric, no tenderness/mass/nodules Lymph nodes: Cervical, supraclavicular, and axillary nodes normal. Resp: clear to auscultation bilaterally Back: symmetric, no curvature. ROM normal. No CVA tenderness. Cardio: regular rate and rhythm, S1, S2 normal, no  murmur, click, rub or gallop GI: soft, non-tender; bowel sounds normal; no masses,  no organomegaly Extremities: extremities normal, atraumatic, no cyanosis or edema  ECOG PERFORMANCE STATUS: 0 - Asymptomatic  Blood pressure (!) 142/82, pulse 65, temperature 98.2 F (36.8 C), temperature source Oral, resp. rate 18, height 5\' 9"  (1.753 m), weight 241 lb 1.6 oz (109.4 kg), last menstrual period 11/05/2019, SpO2 100 %.  LABORATORY DATA: Lab Results  Component Value Date   WBC 8.1 12/20/2019   HGB 11.5 (L) 12/20/2019   HCT 35.4 (L) 12/20/2019   MCV 85.7 12/20/2019   PLT 188 12/20/2019      Chemistry      Component Value Date/Time   NA 138 09/20/2019 1326   NA 139 01/12/2018 1522   NA 132 (L) 08/13/2017 1044   K 3.7 09/20/2019 1326   K 4.3 08/13/2017 1044   CL 102 09/20/2019 1326   CO2 29 09/20/2019 1326   CO2 25 08/13/2017 1044   BUN 13 09/20/2019 1326   BUN 9 01/12/2018 1522   BUN 18.6 08/13/2017 1044   CREATININE 0.85 09/20/2019 1326   CREATININE 0.76 06/21/2019 1016   CREATININE 1.3 (H) 08/13/2017 1044   GLU 517 (H) 08/13/2017 1257      Component Value Date/Time   CALCIUM 10.0 09/20/2019 1326   CALCIUM 9.9 08/13/2017 1044   ALKPHOS 78 06/21/2019 1016   ALKPHOS 100 08/13/2017 1044   AST 12 (L) 06/21/2019 1016   AST 5 08/13/2017 1044   ALT 32 06/21/2019 1016   ALT 15 08/13/2017 1044   BILITOT 0.4 06/21/2019 1016   BILITOT 0.52 08/13/2017 1044       RADIOGRAPHIC STUDIES: No results found.  ASSESSMENT AND PLAN: This is a very pleasant 29 years old African-American female with: 1) idiopathic thrombocytopenic purpura: She was treated a few months ago with a taper dose of prednisone because of the significant thrombocytopenia.  The patient is currently on observation and she is feeling fine. CBC today showed platelets count of 188,000.  She has mild anemia and she is currently on oral iron tablets. I recommended for the patient to continue on observation with  repeat CBC and LDH in 6 months. She was advised to call immediately if she has any concerning symptoms in the interval. The patient voices understanding of current disease status and treatment options and is in agreement with the current care plan. All questions were answered. The patient knows to call the clinic with any problems, questions or concerns. We can certainly see the patient much sooner if necessary.  Disclaimer: This note was dictated with voice recognition software. Similar sounding words can inadvertently be transcribed and may not be corrected upon review.

## 2019-12-21 ENCOUNTER — Other Ambulatory Visit: Payer: Self-pay

## 2019-12-21 ENCOUNTER — Ambulatory Visit (INDEPENDENT_AMBULATORY_CARE_PROVIDER_SITE_OTHER): Payer: BC Managed Care – PPO | Admitting: Obstetrics & Gynecology

## 2019-12-21 ENCOUNTER — Encounter: Payer: Self-pay | Admitting: Obstetrics & Gynecology

## 2019-12-21 VITALS — BP 134/82 | HR 68 | Temp 97.6°F | Resp 16 | Wt 243.0 lb

## 2019-12-21 DIAGNOSIS — D508 Other iron deficiency anemias: Secondary | ICD-10-CM | POA: Diagnosis not present

## 2019-12-21 DIAGNOSIS — N912 Amenorrhea, unspecified: Secondary | ICD-10-CM

## 2019-12-21 DIAGNOSIS — N898 Other specified noninflammatory disorders of vagina: Secondary | ICD-10-CM

## 2019-12-21 LAB — POCT URINE PREGNANCY: Preg Test, Ur: NEGATIVE

## 2019-12-21 MED ORDER — FLUCONAZOLE 150 MG PO TABS
150.0000 mg | ORAL_TABLET | Freq: Once | ORAL | 0 refills | Status: AC
Start: 2019-12-21 — End: 2019-12-21

## 2019-12-22 ENCOUNTER — Telehealth: Payer: Self-pay | Admitting: Internal Medicine

## 2019-12-22 LAB — IRON,TIBC AND FERRITIN PANEL
Ferritin: 149 ng/mL (ref 15–150)
Iron Saturation: 14 % — ABNORMAL LOW (ref 15–55)
Iron: 49 ug/dL (ref 27–159)
Total Iron Binding Capacity: 344 ug/dL (ref 250–450)
UIBC: 295 ug/dL (ref 131–425)

## 2019-12-22 LAB — VAGINITIS/VAGINOSIS, DNA PROBE
Candida Species: NEGATIVE
Gardnerella vaginalis: NEGATIVE
Trichomonas vaginosis: NEGATIVE

## 2019-12-22 LAB — PROLACTIN: Prolactin: 13.9 ng/mL (ref 4.8–23.3)

## 2019-12-22 LAB — FOLLICLE STIMULATING HORMONE: FSH: 5.4 m[IU]/mL

## 2019-12-22 NOTE — Telephone Encounter (Signed)
Scheduled per los. Called and left msg. Mailed printout  °

## 2019-12-25 DIAGNOSIS — R42 Dizziness and giddiness: Secondary | ICD-10-CM | POA: Diagnosis not present

## 2019-12-26 ENCOUNTER — Other Ambulatory Visit: Payer: Self-pay | Admitting: Family Medicine

## 2020-01-02 ENCOUNTER — Ambulatory Visit (INDEPENDENT_AMBULATORY_CARE_PROVIDER_SITE_OTHER): Payer: BC Managed Care – PPO | Admitting: Otolaryngology

## 2020-01-03 ENCOUNTER — Ambulatory Visit (INDEPENDENT_AMBULATORY_CARE_PROVIDER_SITE_OTHER): Payer: BC Managed Care – PPO | Admitting: Otolaryngology

## 2020-01-04 ENCOUNTER — Encounter (INDEPENDENT_AMBULATORY_CARE_PROVIDER_SITE_OTHER): Payer: Self-pay | Admitting: Otolaryngology

## 2020-01-04 ENCOUNTER — Ambulatory Visit (INDEPENDENT_AMBULATORY_CARE_PROVIDER_SITE_OTHER): Payer: BC Managed Care – PPO | Admitting: Otolaryngology

## 2020-01-04 ENCOUNTER — Other Ambulatory Visit: Payer: Self-pay

## 2020-01-04 VITALS — Temp 97.9°F

## 2020-01-04 DIAGNOSIS — R42 Dizziness and giddiness: Secondary | ICD-10-CM | POA: Diagnosis not present

## 2020-01-04 NOTE — Progress Notes (Signed)
HPI: Kellie Shaffer is a 29 y.o. female who returns today for evaluation of her dizziness.  She recently underwent VNG testing and audiologic testing and this was normal on both accounts.  There was no abnormal vestibular problems and no evidence of BPPV.  Her hearing was normal with SRT's of 15 dB bilaterally and type A tympanograms bilaterally. She feels like her dizziness may be allergy related as she did well over the winter months but just recently started having dizzy problems again over the past 2 months..  Past Medical History:  Diagnosis Date  . Abnormal Pap smear of cervix 2019   LSIL  . Abortion May 2013  . Anemia   . Anxiety   . Diabetes mellitus without complication ([place])   . Dizziness 11/22/2019  . History of ITP   . History of palpitations    Cardiac event monitor June 2019: Normal.  Sinus rhythm no arrhythmias or significant PACs/PVCs.  Marland Kitchen Hypercholesteremia   . Hypertension   . Hyperthyroidism   . Iron deficiency anemia 09/04/2016  . Peripheral vertigo   . Seasonal allergies 11/22/2019  . Vertigo 11/22/2019   Past Surgical History:  Procedure Laterality Date  . LAPAROSCOPIC APPENDECTOMY N/A 01/14/2017   Procedure: APPENDECTOMY LAPAROSCOPIC;  Surgeon: Fanny Skates, MD;  Location: [facility];  Service: General;  Laterality: N/A;   Social History   Socioeconomic History  . Marital status: Single    Spouse name: Not on file  . Number of children: Not on file  . Years of education: 69  . Highest education level: Not on file  Occupational History  . Occupation: [employer]  Tobacco Use  . Smoking status: Former Smoker    Packs/day: 0.25    Years: 10.00    Pack years: 2.50    Types: Cigarettes    Start date: 2011    Quit date: 2021    Years since quitting: 0.3  . Smokeless tobacco: Never Used  Substance and Sexual Activity  . Alcohol use: Not Currently  . Drug use: No  . Sexual activity: Yes    Partners: Male    Birth control/protection: Condom,  Pill  Other Topics Concern  . Not on file  Social History Narrative   Regular exercise-yes   Caffeine Use-yes         Social Determinants of Health   Financial Resource Strain:   . Difficulty of Paying Living Expenses:   Food Insecurity:   . Worried About Charity fundraiser in the Last Year:   . Arboriculturist in the Last Year:   Transportation Needs:   . Film/video editor (Medical):   Marland Kitchen Lack of Transportation (Non-Medical):   Physical Activity:   . Days of Exercise per Week:   . Minutes of Exercise per Session:   Stress:   . Feeling of Stress :   Social Connections:   . Frequency of Communication with Friends and Family:   . Frequency of Social Gatherings with Friends and Family:   . Attends Religious Services:   . Active Member of Clubs or Organizations:   . Attends Archivist Meetings:   Marland Kitchen Marital Status:    Family History  Problem Relation Age of Onset  . Diabetes Other        Parent  . Hypertension Other        Parent  . Hyperlipidemia Other        Parent  . Arthritis Other  Parent  . Breast cancer Mother 26   Allergies  Allergen Reactions  . Benadryl [Diphenhydramine Hcl] Anaphylaxis and Hives  . Chloroxine Hives    Clorox   Prior to Admission medications   Medication Sig Start Date End Date Taking? Authorizing Provider  Acetaminophen (TYLENOL PO) Take by mouth.   Yes [provider]  atorvastatin (LIPITOR) 40 MG tablet TAKE 1 TABLET DAILY 10/16/19  Yes [name], FNP  Biotin 35456 MCG TABS Take 1,000 mcg by mouth daily.    Yes [provider]  busPIRone (BUSPAR) 10 MG tablet TAKE 1 TABLET DAILY 07/07/19  Yes [name], FNP  cetirizine (ZYRTEC) 10 MG tablet Take 1 tablet (10 mg total) by mouth daily as needed for allergies. 09/10/17  Yes Bing Neighbors, FNP  esomeprazole (NEXIUM) 40 MG capsule TAKE 1 CAPSULE(40 MG) BY MOUTH DAILY 06/08/19  Yes [name], FNP  famotidine (PEPCID) 20 MG  tablet TAKE 1 TABLET BY MOUTH TWICE DAILY AS NEEDED FOR HEARTBURN/INDIGESTION 10/16/19  Yes [name], FNP  glipiZIDE (GLUCOTROL) 10 MG tablet TAKE 1 TABLET(10 MG) BY MOUTH TWICE DAILY BEFORE A MEAL 10/13/19  Yes [name], FNP  IRON PO Take 1 tablet by mouth daily.    Yes [provider]  lisinopril (ZESTRIL) 5 MG tablet TAKE 1 TABLET DAILY 07/17/19  Yes [name], FNP  meclizine (ANTIVERT) 25 MG tablet TAKE 1 TABLET(25 MG) BY MOUTH THREE TIMES DAILY AS NEEDED FOR DIZZINESS 06/26/19  Yes [name], FNP  metFORMIN (GLUCOPHAGE) 500 MG tablet Take 1 tablet (500 mg total) by mouth 2 (two) times daily with a meal. 05/29/19  Yes [name], FNP  methimazole (TAPAZOLE) 10 MG tablet TAKE 1 TABLET DAILY 08/31/19  Yes Romero Belling, MD  metoprolol tartrate (LOPRESSOR) 25 MG tablet TAKE ONE-HALF (1/2) TABLET EVERY MORNING 12/26/19  Yes [name], FNP  Multiple Vitamins-Minerals (MULTIVITAMIN ADULT PO) Take by mouth daily.   Yes [provider]  norethindrone (MICRONOR) 0.35 MG tablet Take 1 tablet (0.35 mg total) by mouth daily. 01/12/19  Yes Jerene Bears, MD  omeprazole (PRILOSEC) 40 MG capsule Take 40 mg by mouth daily. 12/18/19  Yes [provider]     Positive ROS: Otherwise negative  All other systems have been reviewed and were otherwise negative with the exception of those mentioned in the HPI and as above.  Physical Exam: Constitutional: Alert, well-appearing, no acute distress Ears: External ears without lesions or tenderness. Ear canals are clear bilaterally with intact, clear TMs.  Nasal: External nose without lesions.. Clear nasal passages Oral: Lips and gums without lesions. Tongue and palate mucosa without lesions. Posterior oropharynx clear. Neck: No palpable adenopathy or masses Respiratory: Breathing comfortably  Skin: No facial/neck lesions or rash noted.  Procedures  Assessment: History of dizziness with normal  vestibular testing and audiologic testing.  Plan: Reviewed with patient that there is no abnormal inner ear problem causing her dizziness.  Suggested trying to determine what she is exposed to or what might she be doing that might trigger the dizziness.  But again reassured her of no inner ear abnormality.   Narda Bonds, MD

## 2020-01-22 ENCOUNTER — Encounter (INDEPENDENT_AMBULATORY_CARE_PROVIDER_SITE_OTHER): Payer: Self-pay

## 2020-02-06 DIAGNOSIS — Z20822 Contact with and (suspected) exposure to covid-19: Secondary | ICD-10-CM | POA: Diagnosis not present

## 2020-03-11 ENCOUNTER — Other Ambulatory Visit: Payer: Self-pay | Admitting: Obstetrics & Gynecology

## 2020-03-11 DIAGNOSIS — N912 Amenorrhea, unspecified: Secondary | ICD-10-CM

## 2020-03-11 NOTE — Telephone Encounter (Signed)
Medication refill request: Norothindrone tab 28S 0.35mg   Last AEX:  01/12/19 Next AEX: 04/02/20 Last MMG (if hormonal medication request): NA Refill authorized: #28/0

## 2020-03-11 NOTE — Telephone Encounter (Signed)
Medication refill request: norethindrone  Last AEX:  01-12-2019 SM  Next AEX: 04-02-20 Last MMG (if hormonal medication request): n/a Refill authorized: Today, please advise.   Medication pended for #28, 0RF. Please refill if appropriate.

## 2020-03-17 DIAGNOSIS — R7309 Other abnormal glucose: Secondary | ICD-10-CM

## 2020-03-17 DIAGNOSIS — R739 Hyperglycemia, unspecified: Secondary | ICD-10-CM

## 2020-03-17 HISTORY — DX: Hyperglycemia, unspecified: R73.9

## 2020-03-17 HISTORY — DX: Other abnormal glucose: R73.09

## 2020-03-19 NOTE — Progress Notes (Deleted)
29 y.o. G56P0010 Single Black or [place] American female here for annual exam.    No LMP recorded.          Sexually active: {yes no:314532}  The current method of family planning is {contraception:315051}.    Exercising: {yes MW:413244}  {types:19826} Smoker:  {YES NO:22349}  Health Maintenance: Pap:  05-11-18 LGSIL, 01-12-2019 neg History of abnormal Pap:  LEEP MMG:  none Colonoscopy:  none BMD:   none TDaP:  2012 Pneumonia vaccine(s):  2011 Shingrix:   no Hep C testing: not done Screening Labs: ***   reports that she quit smoking about 7 months ago. Her smoking use included cigarettes. She started smoking about 10 years ago. She has a 2.50 pack-year smoking history. She has never used smokeless tobacco. She reports previous alcohol use. She reports that she does not use drugs.  Past Medical History:  Diagnosis Date  . Abnormal Pap smear of cervix 2019   LSIL  . Abortion May 2013  . Anemia   . Anxiety   . Diabetes mellitus without complication (HCC)   . Dizziness 11/22/2019  . History of ITP   . History of palpitations    Cardiac event monitor June 2019: Normal.  Sinus rhythm no arrhythmias or significant PACs/PVCs.  Marland Kitchen Hypercholesteremia   . Hypertension   . Hyperthyroidism   . Iron deficiency anemia 09/04/2016  . Peripheral vertigo   . Seasonal allergies 11/22/2019  . Vertigo 11/22/2019    Past Surgical History:  Procedure Laterality Date  . LAPAROSCOPIC APPENDECTOMY N/A 01/14/2017   Procedure: APPENDECTOMY LAPAROSCOPIC;  Surgeon: Claud Kelp, MD;  Location: [facility];  Service: General;  Laterality: N/A;    Current Outpatient Medications  Medication Sig Dispense Refill  . Acetaminophen (TYLENOL PO) Take by mouth.    Marland Kitchen atorvastatin (LIPITOR) 40 MG tablet TAKE 1 TABLET DAILY 90 tablet 3  . Biotin 01027 MCG TABS Take 1,000 mcg by mouth daily.     . busPIRone (BUSPAR) 10 MG tablet TAKE 1 TABLET DAILY 90 tablet 3  . cetirizine (ZYRTEC) 10 MG tablet Take 1 tablet (10  mg total) by mouth daily as needed for allergies. 90 tablet 1  . esomeprazole (NEXIUM) 40 MG capsule TAKE 1 CAPSULE(40 MG) BY MOUTH DAILY 30 capsule 3  . famotidine (PEPCID) 20 MG tablet TAKE 1 TABLET BY MOUTH TWICE DAILY AS NEEDED FOR HEARTBURN/INDIGESTION 60 tablet 3  . glipiZIDE (GLUCOTROL) 10 MG tablet TAKE 1 TABLET(10 MG) BY MOUTH TWICE DAILY BEFORE A MEAL 60 tablet 3  . IRON PO Take 1 tablet by mouth daily.     Marland Kitchen lisinopril (ZESTRIL) 5 MG tablet TAKE 1 TABLET DAILY 90 tablet 3  . meclizine (ANTIVERT) 25 MG tablet TAKE 1 TABLET(25 MG) BY MOUTH THREE TIMES DAILY AS NEEDED FOR DIZZINESS 30 tablet 3  . metFORMIN (GLUCOPHAGE) 500 MG tablet Take 1 tablet (500 mg total) by mouth 2 (two) times daily with a meal. 180 tablet 3  . methimazole (TAPAZOLE) 10 MG tablet TAKE 1 TABLET DAILY 90 tablet 3  . metoprolol tartrate (LOPRESSOR) 25 MG tablet TAKE ONE-HALF (1/2) TABLET EVERY MORNING 90 tablet 3  . Multiple Vitamins-Minerals (MULTIVITAMIN ADULT PO) Take by mouth daily.    . norethindrone (MICRONOR) 0.35 MG tablet TAKE 1 TABLET DAILY 84 tablet 0  . omeprazole (PRILOSEC) 40 MG capsule Take 40 mg by mouth daily.     No current facility-administered medications for this visit.    Family History  Problem Relation Age of Onset  .  Diabetes Other        Parent  . Hypertension Other        Parent  . Hyperlipidemia Other        Parent  . Arthritis Other        Parent  . Breast cancer Mother 59    Review of Systems  Exam:   There were no vitals taken for this visit.     General appearance: alert, cooperative and appears stated age Head: Normocephalic, without obvious abnormality, atraumatic Neck: no adenopathy, supple, symmetrical, trachea midline and thyroid {EXAM; THYROID:18604} Lungs: clear to auscultation bilaterally Breasts: {Exam; breast:13139::"normal appearance, no masses or tenderness"} Heart: regular rate and rhythm Abdomen: soft, non-tender; bowel sounds normal; no masses,  no  organomegaly Extremities: extremities normal, atraumatic, no cyanosis or edema Skin: Skin color, texture, turgor normal. No rashes or lesions Lymph nodes: Cervical, supraclavicular, and axillary nodes normal. No abnormal inguinal nodes palpated Neurologic: Grossly normal   Pelvic: External genitalia:  no lesions              Urethra:  normal appearing urethra with no masses, tenderness or lesions              Bartholins and Skenes: normal                 Vagina: normal appearing vagina with normal color and discharge, no lesions              Cervix: {exam; cervix:14595}              Pap taken: {yes no:314532} Bimanual Exam:  Uterus:  {exam; uterus:12215}              Adnexa: {exam; adnexa:12223}               Rectovaginal: Confirms               Anus:  normal sphincter tone, no lesions  Chaperone, ***Zenovia Jordan, CMA, was present for exam.  A:  Well Woman with normal exam  P:   {plan; gyn:5269::"mammogram","pap smear","return annually or prn"}

## 2020-03-21 ENCOUNTER — Other Ambulatory Visit: Payer: Self-pay

## 2020-03-21 ENCOUNTER — Ambulatory Visit: Payer: BC Managed Care – PPO | Admitting: Endocrinology

## 2020-03-21 ENCOUNTER — Ambulatory Visit: Payer: BC Managed Care – PPO | Admitting: Obstetrics & Gynecology

## 2020-03-21 ENCOUNTER — Telehealth: Payer: Self-pay | Admitting: Obstetrics & Gynecology

## 2020-03-21 NOTE — Telephone Encounter (Signed)
Patient arrived to her aex appointment today and chose cancel. Staff message to [place].

## 2020-03-27 ENCOUNTER — Ambulatory Visit: Payer: BC Managed Care – PPO | Admitting: Family Medicine

## 2020-04-02 ENCOUNTER — Ambulatory Visit: Payer: Managed Care, Other (non HMO) | Admitting: Obstetrics & Gynecology

## 2020-04-03 ENCOUNTER — Other Ambulatory Visit: Payer: Self-pay

## 2020-04-03 ENCOUNTER — Encounter: Payer: Self-pay | Admitting: Family Medicine

## 2020-04-03 ENCOUNTER — Ambulatory Visit (INDEPENDENT_AMBULATORY_CARE_PROVIDER_SITE_OTHER): Payer: BC Managed Care – PPO | Admitting: Family Medicine

## 2020-04-03 VITALS — BP 135/77 | HR 70 | Temp 98.1°F | Resp 16 | Ht 69.0 in | Wt 235.0 lb

## 2020-04-03 DIAGNOSIS — R7309 Other abnormal glucose: Secondary | ICD-10-CM | POA: Diagnosis not present

## 2020-04-03 DIAGNOSIS — T7840XA Allergy, unspecified, initial encounter: Secondary | ICD-10-CM

## 2020-04-03 DIAGNOSIS — R42 Dizziness and giddiness: Secondary | ICD-10-CM | POA: Diagnosis not present

## 2020-04-03 DIAGNOSIS — E119 Type 2 diabetes mellitus without complications: Secondary | ICD-10-CM

## 2020-04-03 DIAGNOSIS — I10 Essential (primary) hypertension: Secondary | ICD-10-CM | POA: Diagnosis not present

## 2020-04-03 DIAGNOSIS — F419 Anxiety disorder, unspecified: Secondary | ICD-10-CM

## 2020-04-03 DIAGNOSIS — Z09 Encounter for follow-up examination after completed treatment for conditions other than malignant neoplasm: Secondary | ICD-10-CM

## 2020-04-03 LAB — POCT URINALYSIS DIPSTICK
Bilirubin, UA: NEGATIVE
Blood, UA: NEGATIVE
Glucose, UA: NEGATIVE
Ketones, UA: NEGATIVE
Leukocytes, UA: NEGATIVE
Nitrite, UA: NEGATIVE
Protein, UA: POSITIVE — AB
Spec Grav, UA: >=1.03 — AB (ref 1.010–1.025)
Urobilinogen, UA: 2 E.U./dL — AB
pH, UA: 6.5 (ref 5.0–8.0)

## 2020-04-03 LAB — POCT GLYCOSYLATED HEMOGLOBIN (HGB A1C)
HbA1c POC (<> result, manual entry): 6.2 % (ref 4.0–5.6)
HbA1c, POC (controlled diabetic range): 6.2 % (ref 0.0–7.0)
HbA1c, POC (prediabetic range): 6.2 % (ref 5.7–6.4)
Hemoglobin A1C: 6.2 % — AB (ref 4.0–5.6)

## 2020-04-03 LAB — GLUCOSE, POCT (MANUAL RESULT ENTRY): POC Glucose: 146 mg/dl — AB (ref 70–99)

## 2020-04-03 NOTE — Progress Notes (Signed)
Patient Kellie Shaffer [facility]    Established Patient Office Visit  Subjective:  Patient ID: Kellie Shaffer, female    DOB: May 03, 1991  Age: 29 y.o. MRN: [id number]  CC:  Chief Complaint  Patient presents with  . Follow-up    Pt states she is here for f/u. and wants to know her A1C.    HPI Kellie Shaffer is a 29 year old female who presents for Follow Up.    Patient Active Problem List   Diagnosis Date Noted  . Seasonal allergies 11/23/2019  . Hemoglobin A1C between 7% and 9% indicating borderline diabetic control 09/28/2019  . Diarrhea 05/29/2019  . Dizziness 05/29/2019  . Type 2 diabetes mellitus without complication, without long-term current use of insulin (Kellie Shaffer) 11/09/2018  . Hypertension 11/09/2018  . Anxiety 11/09/2018  . 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 CIN II (cervical intraepithelial neoplasia II) 07/28/2018  . 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 Palpitations 01/12/2018  . Elevated troponin 01/12/2018  . DKA (diabetic ketoacidoses) ([place]) 08/27/2017  . Hyperglycemia 08/13/2017  . Iron deficiency anemia 09/04/2016  . Hyperthyroidism 07/05/2014  . Thrombocytopenia ([place]) 05/27/2014  . Hypercholesteremia     Past Medical History:  Diagnosis Date  . Abnormal Pap smear of cervix 2019   LSIL  . Abortion May 2013  . Anemia   . Anxiety   . Diabetes mellitus without complication (Kellie Shaffer)   . Dizziness 11/22/2019  . History of ITP   . History of palpitations    Cardiac event monitor June 2019: Normal.  Sinus rhythm no arrhythmias or significant PACs/PVCs.  Kellie Shaffer Hypercholesteremia   . Hypertension   . Hyperthyroidism   . Iron deficiency anemia 09/04/2016  . Peripheral vertigo   . Seasonal allergies 11/22/2019  . Vertigo 11/22/2019    Past Surgical History:  Procedure Laterality Date  . LAPAROSCOPIC APPENDECTOMY N/A 01/14/2017   Procedure: APPENDECTOMY  LAPAROSCOPIC;  Surgeon: Kellie Skates, MD;  Location: [facility];  Service: General;  Laterality: N/A;   Current Status: Since her last office visit, she continues to have intermittent vertigo and dizziness. She had follow up with ENT with negative results. She denies fevers, chills, fatigue, recent infections, weight loss, and night sweats. She has not had any headaches, visual changes, dizziness, and falls. No chest pain, heart palpitations, cough and shortness of breath reported. Denies GI problems such as nausea, vomiting, diarrhea, and constipation. She has no reports of blood in stools, dysuria and hematuria. No depression or anxiety reported today. She is taking all medications as prescribed. She denies pain today.   Family History  Problem Relation Age of Onset  . Diabetes Other        Parent  . Hypertension Other        Parent  . Hyperlipidemia Other        Parent  . Arthritis Other        Parent  . Breast cancer Mother 72    Social History   Socioeconomic History  . Marital status: Single    Spouse name: Not on file  . Number of children: Not on file  . Years of education: 84  . Highest education level: Not on file  Occupational History  . Occupation: [employer]  Tobacco Use  . Smoking status: Former Smoker    Packs/day: 0.25    Years: 10.00    Pack years: 2.50    Types: Cigarettes  Start date: 2011    Quit date: 2021    Years since quitting: 0.6  . Smokeless tobacco: Never Used  Vaping Use  . Vaping Use: Never used  Substance and Sexual Activity  . Alcohol use: Not Currently  . Drug use: No  . Sexual activity: Yes    Partners: Male    Birth control/protection: Condom, Pill  Other Topics Concern  . Not on file  Social History Narrative   Regular exercise-yes   Caffeine Use-yes         Social Determinants of Health   Financial Resource Strain:   . Difficulty of Paying Living Expenses:   Food Insecurity:   . Worried About Charity fundraiser in  the Last Year:   . Arboriculturist in the Last Year:   Transportation Needs:   . Film/video editor (Medical):   Kellie Shaffer Lack of Transportation (Non-Medical):   Physical Activity:   . Days of Exercise per Week:   . Minutes of Exercise per Session:   Stress:   . Feeling of Stress :   Social Connections:   . Frequency of Communication with Friends and Family:   . Frequency of Social Gatherings with Friends and Family:   . Attends Religious Services:   . Active Member of Clubs or Organizations:   . Attends Archivist Meetings:   Kellie Shaffer Marital Status:   Intimate Partner Violence:   . Fear of Current or Ex-Partner:   . Emotionally Abused:   Kellie Shaffer Physically Abused:   . Sexually Abused:     Outpatient Medications Prior to Visit  Medication Sig Dispense Refill  . Acetaminophen (TYLENOL PO) Take by mouth.     Kellie Shaffer atorvastatin (LIPITOR) 40 MG tablet TAKE 1 TABLET DAILY 90 tablet 3  . Biotin 10000 MCG TABS Take 1,000 mcg by mouth daily.     . busPIRone (BUSPAR) 10 MG tablet TAKE 1 TABLET DAILY 90 tablet 3  . cetirizine (ZYRTEC) 10 MG tablet Take 1 tablet (10 mg total) by mouth daily as needed for allergies. 90 tablet 1  . glipiZIDE (GLUCOTROL) 10 MG tablet TAKE 1 TABLET(10 MG) BY MOUTH TWICE DAILY BEFORE A MEAL 60 tablet 3  . IRON PO Take 1 tablet by mouth daily.     Kellie Shaffer lisinopril (ZESTRIL) 5 MG tablet TAKE 1 TABLET DAILY 90 tablet 3  . meclizine (ANTIVERT) 25 MG tablet TAKE 1 TABLET(25 MG) BY MOUTH THREE TIMES DAILY AS NEEDED FOR DIZZINESS 30 tablet 3  . metFORMIN (GLUCOPHAGE) 500 MG tablet Take 1 tablet (500 mg total) by mouth 2 (two) times daily with a meal. 180 tablet 3  . methimazole (TAPAZOLE) 10 MG tablet TAKE 1 TABLET DAILY 90 tablet 3  . metoprolol tartrate (LOPRESSOR) 25 MG tablet TAKE ONE-HALF (1/2) TABLET EVERY MORNING 90 tablet 3  . Multiple Vitamins-Minerals (MULTIVITAMIN ADULT PO) Take by mouth daily.    . norethindrone (MICRONOR) 0.35 MG tablet TAKE 1 TABLET DAILY 84 tablet  0  . omeprazole (PRILOSEC) 40 MG capsule Take 40 mg by mouth daily.    Kellie Shaffer esomeprazole (NEXIUM) 40 MG capsule TAKE 1 CAPSULE(40 MG) BY MOUTH DAILY 30 capsule 3  . famotidine (PEPCID) 20 MG tablet TAKE 1 TABLET BY MOUTH TWICE DAILY AS NEEDED FOR HEARTBURN/INDIGESTION 60 tablet 3   No facility-administered medications prior to visit.    Allergies  Allergen Reactions  . Benadryl [Diphenhydramine Hcl] Anaphylaxis and Hives  . Chloroxine Hives    Clorox  ROS Review of Systems  Constitutional: Negative.   HENT: Negative.   Eyes: Negative.   Respiratory: Negative.   Cardiovascular: Negative.   Gastrointestinal: Positive for abdominal distention.  Endocrine: Negative.   Genitourinary: Negative.   Musculoskeletal: Negative.   Skin: Negative.   Allergic/Immunologic: Negative.   Neurological: Positive for dizziness (occasional ) and headaches (occasional).  Hematological: Negative.   Psychiatric/Behavioral: Negative.     Objective:    Physical Exam Vitals and nursing note reviewed.  Constitutional:      Appearance: Normal appearance.  HENT:     Head: Normocephalic and atraumatic.     Nose: Nose normal.     Mouth/Throat:     Mouth: Mucous membranes are moist.     Pharynx: Oropharynx is clear.  Cardiovascular:     Rate and Rhythm: Normal rate and regular rhythm.     Pulses: Normal pulses.     Heart sounds: Normal heart sounds.  Pulmonary:     Effort: Pulmonary effort is normal.     Breath sounds: Normal breath sounds.  Abdominal:     General: Bowel sounds are normal. There is distension.     Palpations: Abdomen is soft.  Musculoskeletal:        General: Normal range of motion.     Cervical back: Normal range of motion and neck supple.  Skin:    General: Skin is warm and dry.  Neurological:     General: No focal deficit present.     Mental Status: She is alert and oriented to person, place, and time.  Psychiatric:        Mood and Affect: Mood normal.         Behavior: Behavior normal.        Thought Content: Thought content normal.        Judgment: Judgment normal.     BP 135/77 (BP Location: Left Arm, Patient Position: Sitting, Cuff Size: Large)   Pulse 70   Temp 98.1 F (36.7 C)   Resp 16   Ht 5' 9" (1.753 m)   Wt 235 lb (106.6 kg)   LMP 03/17/2020   SpO2 100%   BMI 34.70 kg/m  Wt Readings from Last 3 Encounters:  04/03/20 235 lb (106.6 kg)  12/21/19 243 lb (110.2 kg)  12/20/19 241 lb 1.6 oz (109.4 kg)    Health Maintenance Due  Topic Date Due  . Hepatitis C Screening  Never done  . PNEUMOCOCCAL POLYSACCHARIDE VACCINE AGE 41-64 HIGH RISK  Never done  . COVID-19 Vaccine (1) Never done  . FOOT EXAM  09/10/2018  . INFLUENZA VACCINE  03/17/2020    There are no preventive care reminders to display for this patient.  Lab Results  Component Value Date   TSH 3.77 09/20/2019   Lab Results  Component Value Date   WBC 8.1 12/20/2019   HGB 11.5 (L) 12/20/2019   HCT 35.4 (L) 12/20/2019   MCV 85.7 12/20/2019   PLT 188 12/20/2019   Lab Results  Component Value Date   NA 138 12/20/2019   K 3.9 12/20/2019   CHLORIDE 95 (L) 08/13/2017   CO2 24 12/20/2019   GLUCOSE 151 (H) 12/20/2019   BUN 7 12/20/2019   CREATININE 0.77 12/20/2019   BILITOT 0.7 12/20/2019   ALKPHOS 76 12/20/2019   AST 20 12/20/2019   ALT 45 (H) 12/20/2019   PROT 8.2 (H) 12/20/2019   ALBUMIN 4.1 12/20/2019   CALCIUM 9.8 12/20/2019   ANIONGAP 8 12/20/2019   EGFR >  60 08/13/2017   GFR 95.33 09/20/2019   Lab Results  Component Value Date   CHOL 216 (H) 08/31/2017   Lab Results  Component Value Date   HDL 35 (L) 08/31/2017   Lab Results  Component Value Date   LDLCALC 168 (H) 08/31/2017   Lab Results  Component Value Date   TRIG 67 08/31/2017   Lab Results  Component Value Date   CHOLHDL 6.2 08/31/2017   Lab Results  Component Value Date   HGBA1C 6.2 (A) 04/03/2020   HGBA1C 6.2 04/03/2020   HGBA1C 6.2 04/03/2020   HGBA1C 6.2 04/03/2020        Assessment & Plan:   1. Type 2 diabetes mellitus without complication, without long-term current use of insulin (HCC) She will continue medication as prescribed, to decrease foods/beverages high in sugars and carbs and follow Heart Healthy or DASH diet. Increase physical activity to at least 30 minutes cardio exercise daily.  - Urinalysis Dipstick - POC HgB A1c - POC Glucose (CBG)  2. Hemoglobin A1c less than 7.0%  3. Elevated glucose  4. Hypertension, unspecified type The current medical regimen is effective; blood pressure is stable at 135/77 today; continue present plan and medications as prescribed. She will continue to take medications as prescribed, to decrease high sodium intake, excessive alcohol intake, increase potassium intake, smoking cessation, and increase physical activity of at least 30 minutes of cardio activity daily. She will continue to follow Heart Healthy or DASH diet.  5. Dizziness She has been evaluated thoroughly with ENT with negative findings.  - Ambulatory referral to Allergy  6. Anxiety  7. Allergy, initial encounter - Ambulatory referral to Allergy  8. Vertigo - Ambulatory referral to Allergy  9. Follow up She will follow up in 6 months.   No orders of the defined types were placed in this encounter.   Orders Placed This Encounter  Procedures  . Ambulatory referral to Allergy  . Urinalysis Dipstick  . POC HgB A1c  . POC Glucose (CBG)     Referral Orders     Ambulatory referral to [place],  MSN, FNP-BC [address], [place] [address] [phone] fax   Problem List Items Addressed This Visit      Cardiovascular and Mediastinum   Hypertension     Endocrine   Hemoglobin A1C between 7% and 9% indicating borderline diabetic control   Type 2 diabetes mellitus without complication,  without long-term current use of insulin (HCC) - Primary   Relevant Orders   Urinalysis Dipstick (Completed)   POC HgB A1c (Completed)   POC Glucose (CBG) (Completed)     Other   Anxiety   Dizziness   Relevant Orders   Ambulatory referral to Allergy    Other Visit Diagnoses    Elevated glucose       Allergy, initial encounter       Relevant Orders   Ambulatory referral to Allergy   Vertigo       Relevant Orders   Ambulatory referral to Allergy   Follow up          No orders of the defined types were placed in this encounter.   Follow-up: No follow-ups on file.    Azzie Glatter, FNP

## 2020-04-04 ENCOUNTER — Encounter: Payer: Self-pay | Admitting: Endocrinology

## 2020-04-04 ENCOUNTER — Ambulatory Visit (INDEPENDENT_AMBULATORY_CARE_PROVIDER_SITE_OTHER): Payer: BC Managed Care – PPO | Admitting: Endocrinology

## 2020-04-04 VITALS — BP 106/70 | HR 90 | Ht 69.0 in | Wt 233.8 lb

## 2020-04-04 DIAGNOSIS — E0590 Thyrotoxicosis, unspecified without thyrotoxic crisis or storm: Secondary | ICD-10-CM

## 2020-04-04 LAB — T4, FREE: Free T4: 1.31 ng/dL (ref 0.60–1.60)

## 2020-04-04 LAB — TSH: TSH: 2.33 u[IU]/mL (ref 0.35–4.50)

## 2020-04-04 NOTE — Progress Notes (Signed)
Subjective:    Patient ID: Kellie Shaffer, female    DOB: 01-28-1991, 29 y.o.   MRN: [id number]  HPI Pt returns for f/u of hyperthyroidism (dx'ed 2013, during a pregnancy; US showed a small multinodular goiter, but she also has proptosis; she was then rx'ed with tapazole; she was lost to f/u, but hyperthyroidism was found again during a hospitalization in late 2015 for appendicitis; she was restarted on tapazole then; pt says she is not at risk for another pregnancy; f/u TFT in 2018 were normal off rx; later in 2018, it was resumed due to suppressed TSH; she is not at risk for pregnancy).   She says she never misses the methimazole.  pt states she feels well in general.  Specifically, she denies palpitations and tremor.   Past Medical History:  Diagnosis Date  . Abnormal Pap smear of cervix 2019   LSIL  . Abortion May 2013  . Anemia   . Anxiety   . Diabetes mellitus without complication ([facility])   . Dizziness 11/22/2019  . Hemoglobin A1c less than 7.0% 03/2020  . History of ITP   . History of palpitations    Cardiac event monitor June 2019: Normal.  Sinus rhythm no arrhythmias or significant PACs/PVCs.  Marland Kitchen Hypercholesteremia   . Hyperglycemia 03/2020  . Hypertension   . Hyperthyroidism   . Iron deficiency anemia 09/04/2016  . Peripheral vertigo   . Seasonal allergies 11/22/2019  . Vertigo 11/22/2019    Past Surgical History:  Procedure Laterality Date  . LAPAROSCOPIC APPENDECTOMY N/A 01/14/2017   Procedure: APPENDECTOMY LAPAROSCOPIC;  Surgeon: Claud Kelp, MD;  Location: [facility];  Service: General;  Laterality: N/A;    Social History   Socioeconomic History  . Marital status: Single    Spouse name: Not on file  . Number of children: Not on file  . Years of education: 20  . Highest education level: Not on file  Occupational History  . Occupation: [employer]  Tobacco Use  . Smoking status: Former Smoker    Packs/day: 0.25    Years: 10.00    Pack years: 2.50     Types: Cigarettes    Start date: 2011    Quit date: 2021    Years since quitting: 0.6  . Smokeless tobacco: Never Used  Vaping Use  . Vaping Use: Never used  Substance and Sexual Activity  . Alcohol use: Not Currently  . Drug use: No  . Sexual activity: Yes    Partners: Male    Birth control/protection: Condom, Pill  Other Topics Concern  . Not on file  Social History Narrative   Regular exercise-yes   Caffeine Use-yes         Social Determinants of Health   Financial Resource Strain:   . Difficulty of Paying Living Expenses: Not on file  Food Insecurity:   . Worried About Programme researcher, broadcasting/film/video in the Last Year: Not on file  . Ran Out of Food in the Last Year: Not on file  Transportation Needs:   . Lack of Transportation (Medical): Not on file  . Lack of Transportation (Non-Medical): Not on file  Physical Activity:   . Days of Exercise per Week: Not on file  . Minutes of Exercise per Session: Not on file  Stress:   . Feeling of Stress : Not on file  Social Connections:   . Frequency of Communication with Friends and Family: Not on file  . Frequency of Social Gatherings with Friends  and Family: Not on file  . Attends Religious Services: Not on file  . Active Member of Clubs or Organizations: Not on file  . Attends Banker Meetings: Not on file  . Marital Status: Not on file  Intimate Partner Violence:   . Fear of Current or Ex-Partner: Not on file  . Emotionally Abused: Not on file  . Physically Abused: Not on file  . Sexually Abused: Not on file    Current Outpatient Medications on File Prior to Visit  Medication Sig Dispense Refill  . Acetaminophen (TYLENOL PO) Take 1 tablet by mouth as needed.     Marland Kitchen atorvastatin (LIPITOR) 40 MG tablet TAKE 1 TABLET DAILY 90 tablet 3  . Biotin 96222 MCG TABS Take 1,000 mcg by mouth daily.     . busPIRone (BUSPAR) 10 MG tablet TAKE 1 TABLET DAILY 90 tablet 3  . cetirizine (ZYRTEC) 10 MG tablet Take 1 tablet (10 mg  total) by mouth daily as needed for allergies. (Patient taking differently: Take 10 mg by mouth daily. ) 90 tablet 1  . glipiZIDE (GLUCOTROL) 10 MG tablet TAKE 1 TABLET(10 MG) BY MOUTH TWICE DAILY BEFORE A MEAL 60 tablet 3  . IRON PO Take 1 tablet by mouth daily.     Marland Kitchen lisinopril (ZESTRIL) 5 MG tablet TAKE 1 TABLET DAILY 90 tablet 3  . meclizine (ANTIVERT) 25 MG tablet TAKE 1 TABLET(25 MG) BY MOUTH THREE TIMES DAILY AS NEEDED FOR DIZZINESS 30 tablet 3  . metFORMIN (GLUCOPHAGE) 500 MG tablet Take 1 tablet (500 mg total) by mouth 2 (two) times daily with a meal. 180 tablet 3  . methimazole (TAPAZOLE) 10 MG tablet TAKE 1 TABLET DAILY 90 tablet 3  . metoprolol tartrate (LOPRESSOR) 25 MG tablet TAKE ONE-HALF (1/2) TABLET EVERY MORNING 90 tablet 3  . Multiple Vitamins-Minerals (MULTIVITAMIN ADULT PO) Take by mouth daily.    . norethindrone (MICRONOR) 0.35 MG tablet TAKE 1 TABLET DAILY 84 tablet 0  . omeprazole (PRILOSEC) 40 MG capsule Take 40 mg by mouth daily.     No current facility-administered medications on file prior to visit.    Allergies  Allergen Reactions  . Benadryl [Diphenhydramine Hcl] Anaphylaxis and Hives  . Chloroxine Hives    Clorox    Family History  Problem Relation Age of Onset  . Diabetes Other        Parent  . Hypertension Other        Parent  . Hyperlipidemia Other        Parent  . Arthritis Other        Parent  . Breast cancer Mother 45    BP 106/70   Pulse 90   Ht 5\' 9"  (1.753 m)   Wt 233 lb 12.8 oz (106.1 kg)   LMP 03/17/2020   SpO2 96%   BMI 34.53 kg/m    Review of Systems Denies fever    Objective:   Physical Exam VITAL SIGNS:  See vs page.   GENERAL: no distress.   EYES: bilat proptosis.   NECK: thyroid is slightly and diffusely enlarged.    Lab Results  Component Value Date   TSH 2.33 04/04/2020      Assessment & Plan:  Hyperthyroidism: well-controlled.  Please continue the same medication

## 2020-04-04 NOTE — Patient Instructions (Signed)
Blood tests are requested for you today.  We'll let you know about the results.  If ever you have fever while taking methimazole, stop it and call us, even if the reason is obvious, because of the risk of a rare side-effect.  Please come back for a follow-up appointment in 6 months.   

## 2020-04-17 ENCOUNTER — Ambulatory Visit: Payer: BC Managed Care – PPO | Admitting: Family Medicine

## 2020-04-23 ENCOUNTER — Telehealth: Payer: Self-pay | Admitting: Family Medicine

## 2020-04-23 NOTE — Telephone Encounter (Signed)
Attempted to contact patient today with concerns about her latest abnormal Pap Smear/Post Colposcopy in 2019 and follow up needed. Unable to leave voice message because mailbox is full.

## 2020-04-24 ENCOUNTER — Other Ambulatory Visit: Payer: Self-pay

## 2020-04-24 ENCOUNTER — Encounter: Payer: Self-pay | Admitting: Family Medicine

## 2020-04-24 ENCOUNTER — Ambulatory Visit (INDEPENDENT_AMBULATORY_CARE_PROVIDER_SITE_OTHER): Payer: BC Managed Care – PPO | Admitting: Family Medicine

## 2020-04-24 VITALS — BP 140/85 | HR 70 | Temp 98.7°F | Resp 16 | Ht 69.0 in | Wt 233.6 lb

## 2020-04-24 DIAGNOSIS — F419 Anxiety disorder, unspecified: Secondary | ICD-10-CM | POA: Diagnosis not present

## 2020-04-24 DIAGNOSIS — Z09 Encounter for follow-up examination after completed treatment for conditions other than malignant neoplasm: Secondary | ICD-10-CM

## 2020-04-24 DIAGNOSIS — N760 Acute vaginitis: Secondary | ICD-10-CM

## 2020-04-24 DIAGNOSIS — N898 Other specified noninflammatory disorders of vagina: Secondary | ICD-10-CM

## 2020-04-24 DIAGNOSIS — R42 Dizziness and giddiness: Secondary | ICD-10-CM | POA: Diagnosis not present

## 2020-04-24 DIAGNOSIS — B9689 Other specified bacterial agents as the cause of diseases classified elsewhere: Secondary | ICD-10-CM

## 2020-04-24 DIAGNOSIS — D508 Other iron deficiency anemias: Secondary | ICD-10-CM | POA: Diagnosis not present

## 2020-04-24 MED ORDER — METRONIDAZOLE 500 MG PO TABS: 500.0000 mg | ORAL_TABLET | Freq: Two times a day (BID) | ORAL | 0 refills | Status: AC

## 2020-04-24 NOTE — Progress Notes (Signed)
Patient [name]   Sick Visit  Subjective:  Patient ID: Kellie Shaffer, female    DOB: Jan 04, 1991  Age: 29 y.o. MRN: [id number]  CC:  Chief Complaint  Patient presents with  . Vaginal Discharge    Pt states she notice a different odorx1wk. Pt states no tingling or burning sensation when she urinate. Pt states she had unpretected sex X34month. Pt states the condom broke. Pt states it was some itching but she bought a OTC yeast medication.    HPI Kellie Shaffer is a 29 year old female who presents for Sick Visit today.    Patient Active Problem List   Diagnosis Date Noted  . Seasonal allergies 11/23/2019  . Hemoglobin A1C between 7% and 9% indicating borderline diabetic control 09/28/2019  . Diarrhea 05/29/2019  . Dizziness 05/29/2019  . Type 2 diabetes mellitus without complication, without long-term current use of insulin ([place]) 11/09/2018  . Hypertension 11/09/2018  . Anxiety 11/09/2018  . Chiari malformation type I ([place]) 10/12/2018  . CIN II (cervical intraepithelial neoplasia II) 07/28/2018  . 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 Palpitations 01/12/2018  . Elevated troponin 01/12/2018  . DKA (diabetic ketoacidoses) ([place]) 08/27/2017  . Hyperglycemia 08/13/2017  . Iron deficiency anemia 09/04/2016  . Hyperthyroidism 07/05/2014  . Thrombocytopenia ([place]) 05/27/2014  . Hypercholesteremia    Current Status: Since her last office visit, she has c/o vaginal discharge and odor X 2 weeks. She has used OTC remedies to aid in discomfort, with minimal relief. She denies urinary frequency, dysuria, urinary itching, burning, hematuria, and suprapubic pain/discomfort. She has been having increasing incidents of vertigo and dizziness, blurry vision, and occasional nausea lately. We recently referred her to ENT, who cleared her, and requested her to see an Allergist. She is scheduled  for her initial appointment with Allergist. She denies fevers, chills, fatigue, recent infections, weight loss, and night sweats. She has not had any headaches, and falls. No chest pain, heart palpitations, cough and shortness of breath reported. Denies GI problems such as vomiting, diarrhea, and constipation. She has no reports of blood in stools, dysuria and hematuria. No depression or anxiety reported today. She is taking all medications as prescribed. She denies pain today.   Past Medical History:  Diagnosis Date  . Abnormal Pap smear of cervix 2019   LSIL  . Abortion May 2013  . Anemia   . Anxiety   . Diabetes mellitus without complication ([place])   . Dizziness 11/22/2019  . Hemoglobin A1c less than 7.0% 03/2020  . History of ITP   . History of palpitations    Cardiac event monitor June 2019: Normal.  Sinus rhythm no arrhythmias or significant PACs/PVCs.  Marland Kitchen Hypercholesteremia   . Hyperglycemia 03/2020  . Hypertension   . Hyperthyroidism   . Iron deficiency anemia 09/04/2016  . Peripheral vertigo   . Seasonal allergies 11/22/2019  . Vertigo 11/22/2019  . Vertigo     Past Surgical History:  Procedure Laterality Date  . LAPAROSCOPIC APPENDECTOMY N/A 01/14/2017   Procedure: APPENDECTOMY LAPAROSCOPIC;  Surgeon: Fanny Skates, MD;  Location: [facility];  Service: General;  Laterality: N/A;    Family History  Problem Relation Age of Onset  . Diabetes Other        Parent  . Hypertension Other        Parent  . Hyperlipidemia Other        Parent  .  Arthritis Other        Parent  . Breast cancer Mother 38    Social History   Socioeconomic History  . Marital status: Single    Spouse name: Not on file  . Number of children: Not on file  . Years of education: 74  . Highest education level: Not on file  Occupational History  . Occupation: [employer]  Tobacco Use  . Smoking status: Former Smoker    Packs/day: 0.25    Years: 10.00    Pack years: 2.50    Types: Cigarettes     Start date: 2011    Quit date: 2021    Years since quitting: 0.6  . Smokeless tobacco: Never Used  Vaping Use  . Vaping Use: Never used  Substance and Sexual Activity  . Alcohol use: Not Currently  . Drug use: No  . Sexual activity: Yes    Partners: Male    Birth control/protection: Condom, Pill  Other Topics Concern  . Not on file  Social History Narrative   Regular exercise-yes   Caffeine Use-yes         Social Determinants of Health   Financial Resource Strain:   . Difficulty of Paying Living Expenses: Not on file  Food Insecurity:   . Worried About Charity fundraiser in the Last Year: Not on file  . Ran Out of Food in the Last Year: Not on file  Transportation Needs:   . Lack of Transportation (Medical): Not on file  . Lack of Transportation (Non-Medical): Not on file  Physical Activity:   . Days of Exercise per Week: Not on file  . Minutes of Exercise per Session: Not on file  Stress:   . Feeling of Stress : Not on file  Social Connections:   . Frequency of Communication with Friends and Family: Not on file  . Frequency of Social Gatherings with Friends and Family: Not on file  . Attends Religious Services: Not on file  . Active Member of Clubs or Organizations: Not on file  . Attends Archivist Meetings: Not on file  . Marital Status: Not on file  Intimate Partner Violence:   . Fear of Current or Ex-Partner: Not on file  . Emotionally Abused: Not on file  . Physically Abused: Not on file  . Sexually Abused: Not on file    Outpatient Medications Prior to Visit  Medication Sig Dispense Refill  . Acetaminophen (TYLENOL PO) Take 1 tablet by mouth as needed.     Marland Kitchen atorvastatin (LIPITOR) 40 MG tablet TAKE 1 TABLET DAILY 90 tablet 3  . Biotin 10000 MCG TABS Take 1,000 mcg by mouth daily.     . busPIRone (BUSPAR) 10 MG tablet TAKE 1 TABLET DAILY 90 tablet 3  . cetirizine (ZYRTEC) 10 MG tablet Take 1 tablet (10 mg total) by mouth daily as needed for  allergies. (Patient taking differently: Take 10 mg by mouth daily. ) 90 tablet 1  . glipiZIDE (GLUCOTROL) 10 MG tablet TAKE 1 TABLET(10 MG) BY MOUTH TWICE DAILY BEFORE A MEAL 60 tablet 3  . IRON PO Take 1 tablet by mouth daily.     Marland Kitchen lisinopril (ZESTRIL) 5 MG tablet TAKE 1 TABLET DAILY 90 tablet 3  . meclizine (ANTIVERT) 25 MG tablet TAKE 1 TABLET(25 MG) BY MOUTH THREE TIMES DAILY AS NEEDED FOR DIZZINESS 30 tablet 3  . metFORMIN (GLUCOPHAGE) 500 MG tablet Take 1 tablet (500 mg total) by mouth 2 (two) times  daily with a meal. 180 tablet 3  . methimazole (TAPAZOLE) 10 MG tablet TAKE 1 TABLET DAILY 90 tablet 3  . metoprolol tartrate (LOPRESSOR) 25 MG tablet TAKE ONE-HALF (1/2) TABLET EVERY MORNING 90 tablet 3  . Multiple Vitamins-Minerals (MULTIVITAMIN ADULT PO) Take by mouth daily.    . norethindrone (MICRONOR) 0.35 MG tablet TAKE 1 TABLET DAILY 84 tablet 0  . omeprazole (PRILOSEC) 40 MG capsule Take 40 mg by mouth daily.     No facility-administered medications prior to visit.    Allergies  Allergen Reactions  . Benadryl [Diphenhydramine Hcl] Anaphylaxis and Hives  . Chloroxine Hives    Clorox    ROS Review of Systems  Constitutional: Negative.   HENT: Negative.   Eyes: Negative.   Respiratory: Negative.   Cardiovascular: Negative.   Gastrointestinal: Positive for abdominal distention.  Endocrine: Negative.   Genitourinary: Positive for vaginal discharge.  Musculoskeletal: Negative.   Skin: Negative.   Allergic/Immunologic: Negative.   Neurological: Negative.   Hematological: Negative.   Psychiatric/Behavioral: Negative.    Objective:    Physical Exam Vitals and nursing note reviewed.  Constitutional:      Appearance: Normal appearance.  HENT:     Head: Normocephalic and atraumatic.     Nose: Nose normal.     Mouth/Throat:     Mouth: Mucous membranes are moist.     Pharynx: Oropharynx is clear.  Cardiovascular:     Rate and Rhythm: Normal rate and regular rhythm.      Pulses: Normal pulses.     Heart sounds: Normal heart sounds.  Pulmonary:     Effort: Pulmonary effort is normal.     Breath sounds: Normal breath sounds.  Abdominal:     General: Bowel sounds are normal. There is distension.     Palpations: Abdomen is soft.  Musculoskeletal:        General: Normal range of motion.     Cervical back: Normal range of motion and neck supple.  Skin:    General: Skin is warm and dry.  Neurological:     General: No focal deficit present.     Mental Status: She is alert and oriented to person, place, and time.  Psychiatric:        Mood and Affect: Mood normal.        Behavior: Behavior normal.        Thought Content: Thought content normal.        Judgment: Judgment normal.     BP 140/85 (BP Location: Left Arm, Patient Position: Sitting, Cuff Size: Large)   Pulse 70   Temp 98.7 F (37.1 C)   Resp 16   Ht $R'5\' 9"'Lj$  (1.753 m)   Wt 233 lb 9.6 oz (106 kg)   LMP 04/15/2020 (Exact Date)   SpO2 100%   BMI 34.50 kg/m  Wt Readings from Last 3 Encounters:  04/24/20 233 lb 9.6 oz (106 kg)  04/04/20 233 lb 12.8 oz (106.1 kg)  04/03/20 235 lb (106.6 kg)     Health Maintenance Due  Topic Date Due  . Hepatitis C Screening  Never done  . PNEUMOCOCCAL POLYSACCHARIDE VACCINE AGE 8-64 HIGH RISK  Never done  . COVID-19 Vaccine (1) Never done  . FOOT EXAM  09/10/2018  . INFLUENZA VACCINE  03/17/2020    There are no preventive care reminders to display for this patient.  Lab Results  Component Value Date   TSH 2.33 04/04/2020   Lab Results  Component Value Date  WBC 8.1 12/20/2019   HGB 11.5 (L) 12/20/2019   HCT 35.4 (L) 12/20/2019   MCV 85.7 12/20/2019   PLT 188 12/20/2019   Lab Results  Component Value Date   NA 138 12/20/2019   K 3.9 12/20/2019   CHLORIDE 95 (L) 08/13/2017   CO2 24 12/20/2019   GLUCOSE 151 (H) 12/20/2019   BUN 7 12/20/2019   CREATININE 0.77 12/20/2019   BILITOT 0.7 12/20/2019   ALKPHOS 76 12/20/2019   AST 20  12/20/2019   ALT 45 (H) 12/20/2019   PROT 8.2 (H) 12/20/2019   ALBUMIN 4.1 12/20/2019   CALCIUM 9.8 12/20/2019   ANIONGAP 8 12/20/2019   EGFR >60 08/13/2017   GFR 95.33 09/20/2019   Lab Results  Component Value Date   CHOL 216 (H) 08/31/2017   Lab Results  Component Value Date   HDL 35 (L) 08/31/2017   Lab Results  Component Value Date   LDLCALC 168 (H) 08/31/2017   Lab Results  Component Value Date   TRIG 67 08/31/2017   Lab Results  Component Value Date   CHOLHDL 6.2 08/31/2017   Lab Results  Component Value Date   HGBA1C 6.2 (A) 04/03/2020   HGBA1C 6.2 04/03/2020   HGBA1C 6.2 04/03/2020   HGBA1C 6.2 04/03/2020    Assessment & Plan:   1. Vaginal discharge - NuSwab Vaginitis Plus (VG+)  2. Vaginal odor - NuSwab Vaginitis Plus (VG+)  3. Anxiety Stable.  4. Other iron deficiency anemia - Iron and TIBC([facility])  5. Dizziness Stable. She will keep upcoming follow up appointment with Allegist.   6. Vertigo Stable.   7. BV (bacterial vaginosis) - metroNIDAZOLE (FLAGYL) 500 MG tablet; Take 1 tablet (500 mg total) by mouth 2 (two) times daily for 7 days.  Dispense: 14 tablet; Refill: 0  8. Follow up She will keep follow up appointment.   Meds ordered this encounter  Medications  . metroNIDAZOLE (FLAGYL) 500 MG tablet    Sig: Take 1 tablet (500 mg total) by mouth 2 (two) times daily for 7 days.    Dispense:  14 tablet    Refill:  0    Orders Placed This Encounter  Procedures  . Iron and TIBC([facility])  . NuSwab Vaginitis Plus (VG+)    Referral Orders  No referral(s) requested today   Kathe Becton,  MSN, FNP-BC [address], [place] [phone] fax   Problem List Items Addressed This Visit      Other   Anxiety   Dizziness   Iron deficiency anemia   Relevant Orders   Iron and  TIBC([facility])    Other Visit Diagnoses    Vaginal discharge    -  Primary   Relevant Orders   NuSwab Vaginitis Plus (VG+)   Vaginal odor       Relevant Orders   NuSwab Vaginitis Plus (VG+)   Vertigo       BV (bacterial vaginosis)       Relevant Medications   metroNIDAZOLE (FLAGYL) 500 MG tablet   Follow up          Meds ordered this encounter  Medications  . metroNIDAZOLE (FLAGYL) 500 MG tablet    Sig: Take 1 tablet (500 mg total) by mouth 2 (two) times daily for 7 days.    Dispense:  14 tablet    Refill:  0    Follow-up: No follow-ups on file.  Azzie Glatter, FNP

## 2020-04-24 NOTE — Patient Instructions (Signed)
Metronidazole tablets or capsules What is this medicine? METRONIDAZOLE (me troe NI da zole) is an antiinfective. It is used to treat certain kinds of bacterial and protozoal infections. It will not work for colds, flu, or other viral infections. This medicine may be used for other purposes; ask your health care provider or pharmacist if you have questions. COMMON BRAND NAME(S): Flagyl What should I tell my health care provider before I take this medicine? They need to know if you have any of these conditions:  Cockayne syndrome  history of blood diseases, like sickle cell anemia or leukemia  history of yeast infection  if you often drink alcohol  liver disease  an unusual or allergic reaction to metronidazole, nitroimidazoles, or other medicines, foods, dyes, or preservatives  pregnant or trying to get pregnant  breast-feeding How should I use this medicine? Take this medicine by mouth with a full glass of water. Follow the directions on the prescription label. Take your medicine at regular intervals. Do not take your medicine more often than directed. Take all of your medicine as directed even if you think you are better. Do not skip doses or stop your medicine early. Talk to your pediatrician regarding the use of this medicine in children. Special care may be needed. Overdosage: If you think you have taken too much of this medicine contact a poison control center or emergency room at once. NOTE: This medicine is only for you. Do not share this medicine with others. What if I miss a dose? If you miss a dose, take it as soon as you can. If it is almost time for your next dose, take only that dose. Do not take double or extra doses. What may interact with this medicine? Do not take this medicine with any of the following medications:  alcohol or any product that contains alcohol  cisapride  disulfiram  dronedarone  pimozide  thioridazine This medicine may also interact with  the following medications:  amiodarone  birth control pills  busulfan  carbamazepine  cimetidine  cyclosporine  fluorouracil  lithium  other medicines that prolong the QT interval (cause an abnormal heart rhythm) like dofetilide, ziprasidone  phenobarbital  phenytoin  quinidine  tacrolimus  vecuronium  warfarin This list may not describe all possible interactions. Give your health care provider a list of all the medicines, herbs, non-prescription drugs, or dietary supplements you use. Also tell them if you smoke, drink alcohol, or use illegal drugs. Some items may interact with your medicine. What should I watch for while using this medicine? Tell your doctor or health care professional if your symptoms do not improve or if they get worse. You may get drowsy or dizzy. Do not drive, use machinery, or do anything that needs mental alertness until you know how this medicine affects you. Do not stand or sit up quickly, especially if you are an older patient. This reduces the risk of dizzy or fainting spells. Ask your doctor or health care professional if you should avoid alcohol. Many nonprescription cough and cold products contain alcohol. Metronidazole can cause an unpleasant reaction when taken with alcohol. The reaction includes flushing, headache, nausea, vomiting, sweating, and increased thirst. The reaction can last from 30 minutes to several hours. If you are being treated for a sexually transmitted disease, avoid sexual contact until you have finished your treatment. Your sexual partner may also need treatment. What side effects may I notice from receiving this medicine? Side effects that you should report to   your doctor or health care professional as soon as possible:  allergic reactions like skin rash or hives, swelling of the face, lips, or tongue  confusion  fast, irregular heartbeat  fever, chills, sore throat  fever with rash, swollen lymph nodes, or  swelling of the face  pain, tingling, numbness in the hands or feet  redness, blistering, peeling or loosening of the skin, including inside the mouth  seizures  sign and symptoms of liver injury like dark yellow or brown urine; general ill feeling or flu-like symptoms; light colored stools; loss of appetite; nausea; right upper belly pain; unusually weak or tired; yellowing of the eyes or skin  vaginal discharge, itching, or odor in women Side effects that usually do not require medical attention (report to your doctor or health care professional if they continue or are bothersome):  changes in taste  diarrhea  headache  nausea, vomiting  stomach pain This list may not describe all possible side effects. Call your doctor for medical advice about side effects. You may report side effects to FDA at 1-800-FDA-1088. Where should I keep my medicine? Keep out of the reach of children. Store at room temperature below 25 degrees C (77 degrees F). Protect from light. Keep container tightly closed. Throw away any unused medicine after the expiration date. NOTE: This sheet is a summary. It may not cover all possible information. If you have questions about this medicine, talk to your doctor, pharmacist, or health care provider.  2020 Elsevier/Gold Standard (2018-07-26 06:52:33) Bacterial Vaginosis  Bacterial vaginosis is an infection of the vagina. It happens when too many normal germs (healthy bacteria) grow in the vagina. This infection puts you at risk for infections from sex (STIs). Treating this infection can lower your risk for some STIs. You should also treat this if you are pregnant. It can cause your baby to be born early. Follow these instructions at home: Medicines  Take over-the-counter and prescription medicines only as told by your doctor.  Take or use your antibiotic medicine as told by your doctor. Do not stop taking or using it even if you start to feel better. General  instructions  If you your sexual partner is a woman, tell her that you have this infection. She needs to get treatment if she has symptoms. If you have a female partner, he does not need to be treated.  During treatment: ? Avoid sex. ? Do not douche. ? Avoid alcohol as told. ? Avoid breastfeeding as told.  Drink enough fluid to keep your pee (urine) clear or pale yellow.  Keep your vagina and butt (rectum) clean. ? Wash the area with warm water every day. ? Wipe from front to back after you use the toilet.  Keep all follow-up visits as told by your doctor. This is important. Preventing this condition  Do not douche.  Use only warm water to wash around your vagina.  Use protection when you have sex. This includes: ? Latex condoms. ? Dental dams.  Limit how many people you have sex with. It is best to only have sex with the same person (be monogamous).  Get tested for STIs. Have your partner get tested.  Wear underwear that is cotton or lined with cotton.  Avoid tight pants and pantyhose. This is most important in summer.  Do not use any products that have nicotine or tobacco in them. These include cigarettes and e-cigarettes. If you need help quitting, ask your doctor.  Do not use illegal drugs.    Limit how much alcohol you drink. Contact a doctor if:  Your symptoms do not get better, even after you are treated.  You have more discharge or pain when you pee (urinate).  You have a fever.  You have pain in your belly (abdomen).  You have pain with sex.  Your bleed from your vagina between periods. Summary  This infection happens when too many germs (bacteria) grow in the vagina.  Treating this condition can lower your risk for some infections from sex (STIs).  You should also treat this if you are pregnant. It can cause early (premature) birth.  Do not stop taking or using your antibiotic medicine even if you start to feel better. This information is not  intended to replace advice given to you by your health care provider. Make sure you discuss any questions you have with your health care provider. Document Revised: 07/16/2017 Document Reviewed: 04/18/2016 Elsevier Patient Education  2020 [employer].

## 2020-04-25 ENCOUNTER — Encounter: Payer: Self-pay | Admitting: Family Medicine

## 2020-04-25 LAB — IRON AND TIBC
Iron Saturation: 12 % — ABNORMAL LOW (ref 15–55)
Iron: 43 ug/dL (ref 27–159)
Total Iron Binding Capacity: 350 ug/dL (ref 250–450)
UIBC: 307 ug/dL (ref 131–425)

## 2020-04-26 ENCOUNTER — Other Ambulatory Visit: Payer: Self-pay | Admitting: Family Medicine

## 2020-04-26 ENCOUNTER — Encounter: Payer: Self-pay | Admitting: Family Medicine

## 2020-04-26 DIAGNOSIS — D508 Other iron deficiency anemias: Secondary | ICD-10-CM

## 2020-04-26 MED ORDER — FERROUS SULFATE 325 (65 FE) MG PO TABS: 325.0000 mg | ORAL_TABLET | Freq: Two times a day (BID) | ORAL | 6 refills | Status: DC

## 2020-04-27 LAB — NUSWAB VAGINITIS PLUS (VG+)
Atopobium vaginae: HIGH Score — AB
Candida albicans, NAA: NEGATIVE
Candida glabrata, NAA: NEGATIVE
Chlamydia trachomatis, NAA: NEGATIVE
Megasphaera 1: HIGH Score — AB
Neisseria gonorrhoeae, NAA: NEGATIVE
Trich vag by NAA: NEGATIVE

## 2020-04-29 ENCOUNTER — Telehealth: Payer: Self-pay

## 2020-04-29 NOTE — Progress Notes (Signed)
Pt was called @ 8:52 am to discuss her lab results. Pt stated she will pick up her prescription from pharmacy. Pt also stated she will keep her f/u appt.

## 2020-04-29 NOTE — Telephone Encounter (Signed)
Pt was called @ 8:43 to discuss her lab results. Pt stated she understood her lab results.

## 2020-04-30 ENCOUNTER — Telehealth: Payer: Self-pay | Admitting: Family Medicine

## 2020-05-01 NOTE — Telephone Encounter (Signed)
done

## 2020-05-03 ENCOUNTER — Telehealth: Payer: Self-pay | Admitting: *Deleted

## 2020-05-03 NOTE — Telephone Encounter (Signed)
Message left to return call to [facility] at [phone].   Patient in 08 recall for 03-2020 for aex and pap smear. Per review of records, has not been completed. Message left for patient to return call to office for assistance with scheduling appointment.

## 2020-05-06 ENCOUNTER — Encounter: Payer: Self-pay | Admitting: Family Medicine

## 2020-05-07 ENCOUNTER — Other Ambulatory Visit: Payer: Self-pay | Admitting: Family Medicine

## 2020-05-07 DIAGNOSIS — B379 Candidiasis, unspecified: Secondary | ICD-10-CM

## 2020-05-07 MED ORDER — FLUCONAZOLE 150 MG PO TABS: 150.0000 mg | ORAL_TABLET | Freq: Every day | ORAL | 0 refills | Status: DC

## 2020-05-16 ENCOUNTER — Other Ambulatory Visit: Payer: Self-pay

## 2020-05-16 ENCOUNTER — Ambulatory Visit (INDEPENDENT_AMBULATORY_CARE_PROVIDER_SITE_OTHER): Payer: BC Managed Care – PPO | Admitting: Allergy

## 2020-05-16 ENCOUNTER — Encounter: Payer: Self-pay | Admitting: Allergy

## 2020-05-16 VITALS — BP 140/80 | HR 66 | Temp 97.2°F | Resp 16 | Ht 69.0 in | Wt 234.8 lb

## 2020-05-16 DIAGNOSIS — L508 Other urticaria: Secondary | ICD-10-CM

## 2020-05-16 DIAGNOSIS — J3089 Other allergic rhinitis: Secondary | ICD-10-CM

## 2020-05-16 DIAGNOSIS — R42 Dizziness and giddiness: Secondary | ICD-10-CM

## 2020-05-16 NOTE — Patient Instructions (Addendum)
-   environmental allergy testing is positive to grass pollen, weed pollen, tree pollen, mold, dust mite, cat - allergen avoidance measures discussed/handouts provided - resume Zyrtec 10mg  daily - would recommend using a nasal steroid spray like Flonase, Rhinocort or Nasacort 2 sprays each nostril daily at this time to help with congestion control - would also recommend use of nasal saline rinses prior to use of nasal steroid sprays to help flush out the sinus passage and also help nasal spray work more effectively - allergen immunotherapy has been discussed today.  This is a long term therapy that last approximately 3-5 years in duration that "re-trains" your allergic immune symptoms to be tolerant to the allergens above.  If you are not longer reactive to the allergen exposure then you should not develop symptoms or be dependent on medications as much as you are now  -at this time etiology of hives is spontaneous.  Hives can be caused by a variety of different triggers including illness/infection, foods, medications, stings, exercise, pressure, vibrations, extremes of temperature to name a few however majority of the time there is no identifiable trigger - continue zyrtec daily use for management  Follow-up in 3-4 months or sooner if needed

## 2020-05-16 NOTE — Progress Notes (Signed)
New Patient Note  RE: Kellie Shaffer MRN: [id number] DOB: [birth year] Date of Office Visit: 05/16/2020  Referring provider: [name], FNP Primary care provider: [name], FNP  Chief Complaint: dizziness    History of present illness: Kellie Shaffer is a 29 y.o. female presenting today for consultation for vertigo.  She reports having vertigo over the past 2 years that only ocurrs during certain times of the year. When she is dizzy she feels like everything around her is spinning PCP recommended she be evaluated for possible allergies that is triggering the dizziness.  She states she he has seen other specialist for this including ENT and neurology. He has had MRI that was negative per patient for any tumors or abnormalities.  She states the vertigo gets worse in spring and fall. She states her ears always feel weird with popping and itchy feeling. She states it seemed to all start when she moved into her rental home where there are lot of pine trees.  She has been recommended to use antivert but she states it puts her to sleep and not sure if it really helps.   She also has history of hives since she was a child. She states she can have hive breakout daily.  She states benadryl made the hives worse and she had swelling and was advised to never take it again.  She also reports clorox if it touched her skin she would develop a break out and if she inhales it she states she feels itchier and if inhaled too much feels like throat feels like it is closing.  She takes zyrtec daily and held antihistamine for past 3 days.  She states off the zyrtec her nose feels congested and she feels her skin is on fire and itching.  She states when she is on zyrtec the hives are under control. She takes 1 tab a day.    She states when she was younger she was using flonase. She also was performing netipot saline rinse when younger with the flonase as well.    No history of asthma,  eczema or food allergy. She states she is lactose intolerant.  Review of systems: Review of Systems  Constitutional: Negative.   HENT:       See HPI  Eyes: Negative.   Respiratory: Negative.   Cardiovascular: Negative.   Gastrointestinal: Negative.   Musculoskeletal: Negative.   Skin: Positive for itching and rash.  Neurological:       See HPI    All other systems negative unless noted above in HPI  Past medical history: Past Medical History:  Diagnosis Date  . Abnormal Pap smear of cervix 2019   LSIL  . Abortion May 2013  . Anemia   . Angio-edema   . Anxiety   . Diabetes mellitus without complication (HCC)   . Dizziness 11/22/2019  . Hemoglobin A1c less than 7.0% 03/2020  . History of ITP   . History of palpitations    Cardiac event monitor June 2019: Normal.  Sinus rhythm no arrhythmias or significant PACs/PVCs.  Marland Kitchen Hypercholesteremia   . Hyperglycemia 03/2020  . Hypertension   . Hyperthyroidism   . Iron deficiency anemia 09/04/2016  . Peripheral vertigo   . Recurrent upper respiratory infection (URI)   . Seasonal allergies 11/22/2019  . Urticaria   . Vertigo 11/22/2019  . Vertigo     Past surgical history: Past Surgical History:  Procedure Laterality Date  . LAPAROSCOPIC APPENDECTOMY N/A  01/14/2017   Procedure: APPENDECTOMY LAPAROSCOPIC;  Surgeon: Claud Kelp, MD;  Location: [facility];  Service: General;  Laterality: N/A;    Family history:  Family History  Problem Relation Age of Onset  . Diabetes Other        Parent  . Hypertension Other        Parent  . Hyperlipidemia Other        Parent  . Arthritis Other        Parent  . Breast cancer Mother 35    Social history: She lives in an apartment with carpeting with gas heating and central cooling. No pets in the home. There is concern for water damage and mildew. No concern for roaches. She is a Secondary school teacher. She d currently does not smoke. She quit August 18, 2019. She was smoking  new points 1 pack every 3 days and unsure for how long she was smoking.  Medication List: Current Outpatient Medications  Medication Sig Dispense Refill  . Acetaminophen (TYLENOL PO) Take 1 tablet by mouth as needed.     Marland Kitchen atorvastatin (LIPITOR) 40 MG tablet TAKE 1 TABLET DAILY 90 tablet 3  . Biotin 67619 MCG TABS Take 1,000 mcg by mouth daily.     . busPIRone (BUSPAR) 10 MG tablet TAKE 1 TABLET DAILY 90 tablet 3  . cetirizine (ZYRTEC) 10 MG tablet Take 1 tablet (10 mg total) by mouth daily as needed for allergies. (Patient taking differently: Take 10 mg by mouth daily. ) 90 tablet 1  . ferrous sulfate 325 (65 FE) MG tablet Take 1 tablet (325 mg total) by mouth 2 (two) times daily with a meal. 60 tablet 6  . glipiZIDE (GLUCOTROL) 10 MG tablet TAKE 1 TABLET(10 MG) BY MOUTH TWICE DAILY BEFORE A MEAL 60 tablet 3  . lisinopril (ZESTRIL) 5 MG tablet TAKE 1 TABLET DAILY 90 tablet 3  . meclizine (ANTIVERT) 25 MG tablet TAKE 1 TABLET(25 MG) BY MOUTH THREE TIMES DAILY AS NEEDED FOR DIZZINESS 30 tablet 3  . metFORMIN (GLUCOPHAGE) 500 MG tablet Take 1 tablet (500 mg total) by mouth 2 (two) times daily with a meal. 180 tablet 3  . methimazole (TAPAZOLE) 10 MG tablet TAKE 1 TABLET DAILY 90 tablet 3  . metoprolol tartrate (LOPRESSOR) 25 MG tablet TAKE ONE-HALF (1/2) TABLET EVERY MORNING 90 tablet 3  . Multiple Vitamins-Minerals (MULTIVITAMIN ADULT PO) Take by mouth daily.    . norethindrone (MICRONOR) 0.35 MG tablet TAKE 1 TABLET DAILY 84 tablet 0  . omeprazole (PRILOSEC) 40 MG capsule Take 40 mg by mouth daily.    . fluconazole (DIFLUCAN) 150 MG tablet Take 1 tablet (150 mg total) by mouth daily. (Patient not taking: Reported on 05/16/2020) 1 tablet 0   No current facility-administered medications for this visit.    Known medication allergies: Allergies  Allergen Reactions  . Benadryl [Diphenhydramine Hcl] Anaphylaxis and Hives  . Chloroxine Hives    Clorox     Physical examination: Blood  pressure 140/80, pulse 66, temperature (!) 97.2 F (36.2 C), resp. rate 16, height 5\' 9"  (1.753 m), weight 234 lb 12.8 oz (106.5 kg), SpO2 100 %.  General: Alert, interactive, in no acute distress. HEENT: PERRLA, TMs pearly gray, turbinates moderately edematous without discharge, post-pharynx non erythematous. Neck: Supple without lymphadenopathy. Lungs: Clear to auscultation without wheezing, rhonchi or rales. {no increased work of breathing. CV: Normal S1, S2 without murmurs. Abdomen: Nondistended, nontender. Skin: Warm and dry, without lesions or rashes. Extremities:  No  clubbing, cyanosis or edema. Neuro:   Grossly intact.  Diagnositics/Labs: Allergy testing: Environmental allergy skin prick testing is positive to [place] blue, Timothy, lambs quarters, mugwort, ash, pine, Alternaria, penicillium, Fusarium. Intradermal testing is positive to cat, mite mix. Allergy testing results were read and interpreted by provider, documented by clinical staff.   Assessment and plan: Vertigo Allergic rhinitis  - environmental allergy testing is positive to grass pollen, weed pollen, tree pollen, mold, dust mite, cat - allergen avoidance measures discussed/handouts provided - resume Zyrtec 10mg  daily - would recommend using a nasal steroid spray like Flonase, Rhinocort or Nasacort 2 sprays each nostril daily at this time to help with congestion control - would also recommend use of nasal saline rinses prior to use of nasal steroid sprays to help flush out the sinus passage and also help nasal spray work more effectively - allergen immunotherapy has been discussed today.  This is a long term therapy that last approximately 3-5 years in duration that "re-trains" your allergic immune symptoms to be tolerant to the allergens above.  If you are not longer reactive to the allergen exposure then you should not develop symptoms or be dependent on medications as much as you are now  Chronic urticaria -at  this time etiology of hives is spontaneous.  Hives can be caused by a variety of different triggers including illness/infection, foods, medications, stings, exercise, pressure, vibrations, extremes of temperature to name a few however majority of the time there is no identifiable trigger - continue zyrtec daily use for management  Follow-up in 3-4 months or sooner if needed  I appreciate the opportunity to take part in [place] care. Please do not hesitate to contact me with questions.  Sincerely,   [place], MD Allergy/Immunology [facility]

## 2020-05-23 NOTE — Telephone Encounter (Signed)
Call to patient. Patient advised of need to schedule aex and pap smear. Patient agreeable. Patient scheduled for 06-06-20 at 1030 with Dr. Hyacinth Meeker. Patient agreeable to date and time of appointment.   Routing to provider and will close encounter.

## 2020-06-04 ENCOUNTER — Other Ambulatory Visit: Payer: Self-pay | Admitting: Obstetrics & Gynecology

## 2020-06-04 DIAGNOSIS — N912 Amenorrhea, unspecified: Secondary | ICD-10-CM

## 2020-06-04 NOTE — Progress Notes (Deleted)
29 y.o. G25P0010 Single Black or [place] American female here for annual exam.    No LMP recorded.          Sexually active: {yes no:314532}  The current method of family planning is {contraception:315051}.    Exercising: {yes DU:202542}  {types:19826} Smoker:  {YES NO:22349}  Health Maintenance: Pap:  05-11-18 LGSIL, 01-12-2019 neg History of abnormal Pap:  LEEP MMG:  none Colonoscopy:  none BMD:   none TDaP:  2012 Pneumonia vaccine(s):  2011 Shingrix:   no Hep C testing: no Screening Labs: ***   reports that she quit smoking about 9 months ago. Her smoking use included cigarettes. She started smoking about 10 years ago. She has a 2.50 pack-year smoking history. She has never used smokeless tobacco. She reports previous alcohol use. She reports that she does not use drugs.  Past Medical History:  Diagnosis Date  . Abnormal Pap smear of cervix 2019   LSIL  . Abortion May 2013  . Anemia   . Angio-edema   . Anxiety   . Diabetes mellitus without complication (HCC)   . Dizziness 11/22/2019  . Hemoglobin A1c less than 7.0% 03/2020  . History of ITP   . History of palpitations    Cardiac event monitor June 2019: Normal.  Sinus rhythm no arrhythmias or significant PACs/PVCs.  Marland Kitchen Hypercholesteremia   . Hyperglycemia 03/2020  . Hypertension   . Hyperthyroidism   . Iron deficiency anemia 09/04/2016  . Peripheral vertigo   . Recurrent upper respiratory infection (URI)   . Seasonal allergies 11/22/2019  . Urticaria   . Vertigo 11/22/2019  . Vertigo     Past Surgical History:  Procedure Laterality Date  . LAPAROSCOPIC APPENDECTOMY N/A 01/14/2017   Procedure: APPENDECTOMY LAPAROSCOPIC;  Surgeon: Claud Kelp, MD;  Location: [facility];  Service: General;  Laterality: N/A;    Current Outpatient Medications  Medication Sig Dispense Refill  . Acetaminophen (TYLENOL PO) Take 1 tablet by mouth as needed.     Marland Kitchen atorvastatin (LIPITOR) 40 MG tablet TAKE 1 TABLET DAILY 90 tablet 3  .  Biotin 70623 MCG TABS Take 1,000 mcg by mouth daily.     . busPIRone (BUSPAR) 10 MG tablet TAKE 1 TABLET DAILY 90 tablet 3  . cetirizine (ZYRTEC) 10 MG tablet Take 1 tablet (10 mg total) by mouth daily as needed for allergies. (Patient taking differently: Take 10 mg by mouth daily. ) 90 tablet 1  . ferrous sulfate 325 (65 FE) MG tablet Take 1 tablet (325 mg total) by mouth 2 (two) times daily with a meal. 60 tablet 6  . fluconazole (DIFLUCAN) 150 MG tablet Take 1 tablet (150 mg total) by mouth daily. (Patient not taking: Reported on 05/16/2020) 1 tablet 0  . glipiZIDE (GLUCOTROL) 10 MG tablet TAKE 1 TABLET(10 MG) BY MOUTH TWICE DAILY BEFORE A MEAL 60 tablet 3  . lisinopril (ZESTRIL) 5 MG tablet TAKE 1 TABLET DAILY 90 tablet 3  . meclizine (ANTIVERT) 25 MG tablet TAKE 1 TABLET(25 MG) BY MOUTH THREE TIMES DAILY AS NEEDED FOR DIZZINESS 30 tablet 3  . metFORMIN (GLUCOPHAGE) 500 MG tablet Take 1 tablet (500 mg total) by mouth 2 (two) times daily with a meal. 180 tablet 3  . methimazole (TAPAZOLE) 10 MG tablet TAKE 1 TABLET DAILY 90 tablet 3  . metoprolol tartrate (LOPRESSOR) 25 MG tablet TAKE ONE-HALF (1/2) TABLET EVERY MORNING 90 tablet 3  . Multiple Vitamins-Minerals (MULTIVITAMIN ADULT PO) Take by mouth daily.    Marland Kitchen  norethindrone (MICRONOR) 0.35 MG tablet TAKE 1 TABLET DAILY 84 tablet 0  . omeprazole (PRILOSEC) 40 MG capsule Take 40 mg by mouth daily.     No current facility-administered medications for this visit.    Family History  Problem Relation Age of Onset  . Diabetes Other        Parent  . Hypertension Other        Parent  . Hyperlipidemia Other        Parent  . Arthritis Other        Parent  . Breast cancer Mother 44    Review of Systems  Exam:   There were no vitals taken for this visit.     General appearance: alert, cooperative and appears stated age Head: Normocephalic, without obvious abnormality, atraumatic Neck: no adenopathy, supple, symmetrical, trachea midline and  thyroid {EXAM; THYROID:18604} Lungs: clear to auscultation bilaterally Breasts: {Exam; breast:13139::"normal appearance, no masses or tenderness"} Heart: regular rate and rhythm Abdomen: soft, non-tender; bowel sounds normal; no masses,  no organomegaly Extremities: extremities normal, atraumatic, no cyanosis or edema Skin: Skin color, texture, turgor normal. No rashes or lesions Lymph nodes: Cervical, supraclavicular, and axillary nodes normal. No abnormal inguinal nodes palpated Neurologic: Grossly normal   Pelvic: External genitalia:  no lesions              Urethra:  normal appearing urethra with no masses, tenderness or lesions              Bartholins and Skenes: normal                 Vagina: normal appearing vagina with normal color and discharge, no lesions              Cervix: {exam; cervix:14595}              Pap taken: {yes no:314532} Bimanual Exam:  Uterus:  {exam; uterus:12215}              Adnexa: {exam; adnexa:12223}               Rectovaginal: Confirms               Anus:  normal sphincter tone, no lesions  Chaperone, ***Zenovia Jordan, CMA, was present for exam.  A:  Well Woman with normal exam  P:   {plan; gyn:5269::"mammogram","pap smear","return annually or prn"}

## 2020-06-04 NOTE — Telephone Encounter (Signed)
Medication refill request: Norethindrone 28  0.35mg   Last AEX:  12/21/19 Next AEX: 06/06/20 Last MMG (if hormonal medication request): NA Refill authorized: 84/0

## 2020-06-06 ENCOUNTER — Encounter: Payer: Self-pay | Admitting: *Deleted

## 2020-06-06 ENCOUNTER — Ambulatory Visit: Payer: BC Managed Care – PPO | Admitting: Obstetrics & Gynecology

## 2020-06-24 ENCOUNTER — Inpatient Hospital Stay: Payer: BC Managed Care – PPO

## 2020-06-24 ENCOUNTER — Encounter: Payer: Self-pay | Admitting: Family Medicine

## 2020-06-24 ENCOUNTER — Inpatient Hospital Stay: Payer: BC Managed Care – PPO | Attending: Internal Medicine | Admitting: Internal Medicine

## 2020-06-24 ENCOUNTER — Other Ambulatory Visit: Payer: Self-pay | Admitting: Family Medicine

## 2020-06-24 DIAGNOSIS — B379 Candidiasis, unspecified: Secondary | ICD-10-CM

## 2020-06-24 MED ORDER — FLUCONAZOLE 150 MG PO TABS: 150.0000 mg | ORAL_TABLET | Freq: Every day | ORAL | 0 refills | Status: DC

## 2020-06-25 ENCOUNTER — Other Ambulatory Visit: Payer: Self-pay | Admitting: Family Medicine

## 2020-06-25 DIAGNOSIS — N390 Urinary tract infection, site not specified: Secondary | ICD-10-CM

## 2020-06-25 MED ORDER — METRONIDAZOLE 500 MG PO TABS: 500.0000 mg | ORAL_TABLET | Freq: Three times a day (TID) | ORAL | 0 refills | Status: AC

## 2020-06-26 ENCOUNTER — Telehealth: Payer: Self-pay | Admitting: Family Medicine

## 2020-06-26 NOTE — Telephone Encounter (Signed)
t

## 2020-07-01 ENCOUNTER — Other Ambulatory Visit: Payer: Self-pay | Admitting: Family Medicine

## 2020-07-01 DIAGNOSIS — F419 Anxiety disorder, unspecified: Secondary | ICD-10-CM

## 2020-07-08 ENCOUNTER — Telehealth: Payer: Self-pay | Admitting: Family Medicine

## 2020-07-08 ENCOUNTER — Ambulatory Visit: Payer: BC Managed Care – PPO | Admitting: Family Medicine

## 2020-07-08 NOTE — Telephone Encounter (Signed)
Pt was called about awv w/ pcc. lvm to return call  

## 2020-07-09 ENCOUNTER — Telehealth: Payer: Self-pay | Admitting: Allergy

## 2020-07-09 NOTE — Telephone Encounter (Signed)
Please advise to what note should state

## 2020-07-09 NOTE — Telephone Encounter (Signed)
Patient states she figured out that she was allergic to pine trees from the allergy test which was contributing to her vertigo. Patient was advised to move out of her apartment complex as soon as possible to avoid the pine trees. Patient's landlord is requesting a written letter (showing them the office note was not sufficient) stating that the doctor suggest that she move out due to the allergy and a 60 day notice. Patient wanted to know if the doctor could recommend a shorter time to move than 60 days.  Please advise.

## 2020-07-11 ENCOUNTER — Other Ambulatory Visit: Payer: Self-pay | Admitting: Family Medicine

## 2020-07-16 NOTE — Telephone Encounter (Signed)
We discussed her allergy test results including positive results for tree pollens at her visit however it was not suggested that she needed to move from her current home.  She did mention that there are a lot of pine trees around where she currently lives which can lead to symptoms seasonally with exposure.  However having tree pollen allergy does not mean she needs to move from her current home unless she is moving for other reasons.  Pine trees like other tree pollens are going to be everywhere.  Thus moving to a different dwelling for the sole purpose of avoiding pine trees will not correct this issue as she will have exposure to pine trees anywhere that she lives. I can provide her with a letter stating she does have allergic rhinitis that can contribute to her vertigo symptoms and can provide with a list of the environmental allergens if she would like this type of letter.

## 2020-07-22 NOTE — Telephone Encounter (Signed)
Called and spoke with the patient and she did advised that she spoke with you about this during the visit. She stated that the apartment complex she lives in is called [employer] and she stated that the entire apartment surrounding is majority pine trees which she stated that during the visit it was discussed that this could be contributing to her vertigo and that it could be better for her to move out of the community. She stated that you said if she did decide to move and needed further document to support her reasoning to move to contact our office and let us know, she stated that was why she was reaching out to [place]. Please advise.

## 2020-07-23 NOTE — Telephone Encounter (Signed)
Letter is complete and awaiting signature. Patient will be notified when letter is signed and ready for pick up.

## 2020-07-23 NOTE — Telephone Encounter (Signed)
Ok yes that is very different than the initial message.  I can definitely provide with letter documenting her allergies.  Please make letter as following:   To whom it may concern:  Kellie Shaffer is a patient of mine at the [facility].  She has sensitivity based on allergy testing to tree pollens (namely [place] and [place] trees) as well as grass pollens, weed pollens, molds, cat and dust mites.  Exposure to these allergens can lead to allergy/sinus symptoms including but not limited to itchy/watery eyes, congestion, drainage, sneezing and/or cough.  Significant nasal congestion as experienced by [name] can also potentially lead to symptoms of vertigo.  I have recommended allergen avoidance measures to decrease her exposure to the aforementioned allergens as much as she possibly can in order to improve her symptom control along with medication management.  In doing so [name] has made a decision to move from her current place of dwelling due to the amount of allergen exposure she has there.  Sincerely,    Margo Aye, MD [facility]

## 2020-07-24 NOTE — Telephone Encounter (Signed)
Letter has been signed. The patient has been spoke with and asked for letter to be mailed. The letter has been placed in the mail.

## 2020-08-15 ENCOUNTER — Other Ambulatory Visit: Payer: Self-pay

## 2020-08-15 ENCOUNTER — Encounter: Payer: Self-pay | Admitting: Family Medicine

## 2020-08-15 DIAGNOSIS — N912 Amenorrhea, unspecified: Secondary | ICD-10-CM

## 2020-08-15 MED ORDER — NORETHINDRONE 0.35 MG PO TABS: 1.0000 | ORAL_TABLET | Freq: Every day | ORAL | 2 refills | Status: DC

## 2020-08-19 ENCOUNTER — Ambulatory Visit: Payer: BC Managed Care – PPO | Admitting: Obstetrics & Gynecology

## 2020-08-21 ENCOUNTER — Encounter: Payer: Self-pay | Admitting: Allergy

## 2020-08-21 ENCOUNTER — Ambulatory Visit (INDEPENDENT_AMBULATORY_CARE_PROVIDER_SITE_OTHER): Payer: BC Managed Care – PPO | Admitting: Allergy

## 2020-08-21 VITALS — BP 120/80 | Ht 69.0 in

## 2020-08-21 DIAGNOSIS — M549 Dorsalgia, unspecified: Secondary | ICD-10-CM

## 2020-08-21 DIAGNOSIS — R42 Dizziness and giddiness: Secondary | ICD-10-CM

## 2020-08-21 DIAGNOSIS — J3089 Other allergic rhinitis: Secondary | ICD-10-CM | POA: Diagnosis not present

## 2020-08-21 DIAGNOSIS — L508 Other urticaria: Secondary | ICD-10-CM

## 2020-08-21 MED ORDER — CETIRIZINE HCL 10 MG PO TABS: 10.0000 mg | ORAL_TABLET | Freq: Every day | ORAL | 1 refills | Status: AC | PRN

## 2020-08-21 NOTE — Progress Notes (Signed)
Follow-up Note  RE: Kellie Shaffer MRN: [id number] DOB: [birth year] Date of Office Visit: 08/21/2020   History of present illness: Kellie Shaffer is a 30 y.o. female presenting today for follow-up of allergic rhinitis with vertigo as well as urticaria.  She was last seen in the office on 05/16/2020 by myself. She states she has not had much issues with vertigo since last visit.  However she does note that if she forgets to put her mask on when she goes outside her apartment where there are an abundance of pine trees then she typically will get a episode of vertigo.  When she does wear her mask when she goes outside her apartment she states she does fine.  She has been using a nasal steroid spray like Flonase which she says has been very helpful.  She states she can now breathe and can smell much better.  She also has been taking Zyrtec daily.  She states she has not had any hives as long as she takes her Zyrtec daily. She does state over the past several days when she takes a deep breath she notes a pain in right lower quadrant but states her whole abdomen and back also hurts.  She states she does feel more of the pain when she rotates from left to right.  Denies coughing, no fever.  She states the area doesn't hurt if she uses a heating pad.  She did try Tylenol which did not help.  She has taken ibuprofen which she states did help.  She states she did take some heavier boxes out of the closet and also had go to laundry mat as her laundry in apartment is not working at the time.  She states her apartment complex is working with her and she will be moving but not until March.       Review of systems: Review of Systems  Constitutional: Negative.   HENT: Negative.   Eyes: Negative.   Cardiovascular: Negative.   Gastrointestinal: Negative.   Musculoskeletal: Positive for back pain.  Skin: Negative.   Neurological: Negative.     All other systems negative unless noted above in  HPI  Past medical/social/surgical/family history have been reviewed and are unchanged unless specifically indicated below.  No changes  Medication List: Current Outpatient Medications  Medication Sig Dispense Refill  . atorvastatin (LIPITOR) 40 MG tablet TAKE 1 TABLET DAILY 90 tablet 3  . Biotin 35009 MCG TABS Take 1,000 mcg by mouth daily.     . busPIRone (BUSPAR) 10 MG tablet TAKE 1 TABLET DAILY 90 tablet 3  . cetirizine (ZYRTEC) 10 MG tablet Take 1 tablet (10 mg total) by mouth daily as needed for allergies. (Patient taking differently: Take 10 mg by mouth daily.) 90 tablet 1  . ferrous sulfate 325 (65 FE) MG tablet Take 1 tablet (325 mg total) by mouth 2 (two) times daily with a meal. 60 tablet 6  . glipiZIDE (GLUCOTROL) 10 MG tablet TAKE 1 TABLET(10 MG) BY MOUTH TWICE DAILY BEFORE A MEAL 60 tablet 3  . lisinopril (ZESTRIL) 5 MG tablet TAKE 1 TABLET DAILY 90 tablet 0  . meclizine (ANTIVERT) 25 MG tablet TAKE 1 TABLET(25 MG) BY MOUTH THREE TIMES DAILY AS NEEDED FOR DIZZINESS 30 tablet 3  . metFORMIN (GLUCOPHAGE) 500 MG tablet Take 1 tablet (500 mg total) by mouth 2 (two) times daily with a meal. 180 tablet 3  . metoprolol tartrate (LOPRESSOR) 25 MG tablet TAKE ONE-HALF (1/2) TABLET EVERY  MORNING 90 tablet 3  . Multiple Vitamins-Minerals (MULTIVITAMIN ADULT PO) Take by mouth daily.    . norethindrone (MICRONOR) 0.35 MG tablet Take 1 tablet (0.35 mg total) by mouth daily. 84 tablet 2  . omeprazole (PRILOSEC) 40 MG capsule Take 40 mg by mouth daily.    . Acetaminophen (TYLENOL PO) Take 1 tablet by mouth as needed.  (Patient not taking: Reported on 08/21/2020)    . famotidine (PEPCID) 20 MG tablet Take 20 mg by mouth 2 (two) times daily.    . fluconazole (DIFLUCAN) 150 MG tablet Take 1 tablet (150 mg total) by mouth daily. (Patient not taking: Reported on 08/21/2020) 1 tablet 0  . methimazole (TAPAZOLE) 10 MG tablet TAKE 1 TABLET DAILY (Patient not taking: Reported on 08/21/2020) 90 tablet 3   No  current facility-administered medications for this visit.     Known medication allergies: Allergies  Allergen Reactions  . Benadryl [Diphenhydramine Hcl] Anaphylaxis and Hives  . Chloroxine Hives    Clorox     Physical examination: Blood pressure 120/80, height 5\' 9"  (1.753 m).  General: Alert, interactive, in no acute distress. HEENT: PERRLA, TMs pearly gray, turbinates minimally edematous without discharge, post-pharynx non erythematous. Neck: Supple without lymphadenopathy. Lungs: Clear to auscultation without wheezing, rhonchi or rales. {no increased work of breathing. CV: Normal S1, S2 without murmurs. Abdomen: Nondistended, nontender. Skin: Warm and dry, without lesions or rashes. Extremities:  No clubbing, cyanosis or edema. Neuro:   Grossly intact. MSK: tender to palpation over lower lateral flank  Diagnositics/Labs: None today  Assessment and plan: Allergic rhinitis with vertigo  - continue avoidance measures for grass pollen, weed pollen, tree pollen, mold, dust mite, cat - continue Zyrtec 10mg  daily - use a nasal steroid spray like Flonase, Rhinocort or Nasacort 2 sprays each nostril daily at this time to help with congestion control - use of nasal saline rinses prior to use of nasal steroid sprays to help flush out the sinus passage and also help nasal spray work more effectively - allergen immunotherapy has been discussed.  This is a long term therapy that last approximately 3-5 years in duration that "re-trains" your allergic immune symptoms to be tolerant to the allergens above.  If you are not longer reactive to the allergen exposure then you should not develop symptoms or be dependent on medications as much as you are now  Chronic urticaria - etiology of hives is spontaneous.  Hives can be caused by a variety of different triggers including illness/infection, foods, medications, stings, exercise, pressure, vibrations, extremes of temperature to name a few  however majority of the time there is no identifiable trigger - continue zyrtec daily use for management  Musculoskeletal pain - muscle strain leading to pain on deep breathing.  Likely from overhead movement in moving down boxes - continue to heat the area multiple times a day - continue use of anti-inflammatory pain medications like Ibuprofen, Motrin, Advil, Aleve every 6 hours as needed for pain control - remember to warm of the muscles with stretching before any exercise or activities  Follow-up in 4-6 months or sooner if needed I appreciate the opportunity to take part in [facility] [place] care. Please do not hesitate to contact me with questions.  Sincerely,   , MD Allergy/Immunology [facility]

## 2020-08-21 NOTE — Patient Instructions (Signed)
-   continue avoidance measures for grass pollen, weed pollen, tree pollen, mold, dust mite, cat - continue Zyrtec 10mg  daily - use a nasal steroid spray like Flonase, Rhinocort or Nasacort 2 sprays each nostril daily at this time to help with congestion control - use of nasal saline rinses prior to use of nasal steroid sprays to help flush out the sinus passage and also help nasal spray work more effectively - allergen immunotherapy has been discussed.  This is a long term therapy that last approximately 3-5 years in duration that "re-trains" your allergic immune symptoms to be tolerant to the allergens above.  If you are not longer reactive to the allergen exposure then you should not develop symptoms or be dependent on medications as much as you are now  - etiology of hives is spontaneous.  Hives can be caused by a variety of different triggers including illness/infection, foods, medications, stings, exercise, pressure, vibrations, extremes of temperature to name a few however majority of the time there is no identifiable trigger - continue zyrtec daily use for management  - muscle strain leading to pain on deep breathing.  Likely from overhead movement in moving down boxes - continue to heat the area multiple times a day - continue use of anti-inflammatory pain medications like Ibuprofen, Motrin, Advil, Aleve every 6 hours as needed for pain control - remember to warm of the muscles with stretching before any exercise or activities  Follow-up in 4-6 months or sooner if needed

## 2020-08-26 ENCOUNTER — Other Ambulatory Visit: Payer: Self-pay | Admitting: Endocrinology

## 2020-08-26 ENCOUNTER — Ambulatory Visit: Payer: BC Managed Care – PPO | Admitting: Obstetrics & Gynecology

## 2020-10-02 ENCOUNTER — Other Ambulatory Visit: Payer: Self-pay

## 2020-10-02 ENCOUNTER — Ambulatory Visit (INDEPENDENT_AMBULATORY_CARE_PROVIDER_SITE_OTHER): Payer: BC Managed Care – PPO | Admitting: Family Medicine

## 2020-10-02 ENCOUNTER — Encounter: Payer: Self-pay | Admitting: Family Medicine

## 2020-10-02 VITALS — BP 148/83 | HR 66 | Temp 98.8°F | Ht 69.0 in | Wt 227.0 lb

## 2020-10-02 DIAGNOSIS — R1011 Right upper quadrant pain: Secondary | ICD-10-CM

## 2020-10-02 DIAGNOSIS — R7303 Prediabetes: Secondary | ICD-10-CM | POA: Diagnosis not present

## 2020-10-02 DIAGNOSIS — Z0000 Encounter for general adult medical examination without abnormal findings: Secondary | ICD-10-CM

## 2020-10-02 DIAGNOSIS — Z09 Encounter for follow-up examination after completed treatment for conditions other than malignant neoplasm: Secondary | ICD-10-CM

## 2020-10-02 DIAGNOSIS — E119 Type 2 diabetes mellitus without complications: Secondary | ICD-10-CM

## 2020-10-02 DIAGNOSIS — R739 Hyperglycemia, unspecified: Secondary | ICD-10-CM

## 2020-10-02 DIAGNOSIS — D508 Other iron deficiency anemias: Secondary | ICD-10-CM

## 2020-10-02 DIAGNOSIS — F419 Anxiety disorder, unspecified: Secondary | ICD-10-CM

## 2020-10-02 DIAGNOSIS — R42 Dizziness and giddiness: Secondary | ICD-10-CM

## 2020-10-02 LAB — POCT GLYCOSYLATED HEMOGLOBIN (HGB A1C)
HbA1c POC (<> result, manual entry): 7.1 % (ref 4.0–5.6)
HbA1c, POC (controlled diabetic range): 7.1 % — AB (ref 0.0–7.0)
HbA1c, POC (prediabetic range): 7.1 % — AB (ref 5.7–6.4)
Hemoglobin A1C: 7.1 % — AB (ref 4.0–5.6)

## 2020-10-02 LAB — POCT GLUCOSE (DEVICE FOR HOME USE)
Glucose Fasting, POC: 158 mg/dL — AB (ref 70–99)
POC Glucose: 158 mg/dl — AB (ref 70–99)

## 2020-10-02 NOTE — Progress Notes (Signed)
Patient [facility]    Established Patient Office Visit  Subjective:  Patient ID: Kellie Shaffer, female    DOB: 09-06-1990  Age: 30 y.o. MRN: [id number]  CC:  Chief Complaint  Patient presents with  . Annual Exam    Physical  think she has pulled in stomach , having pain , notice about 1 month ago      HPI Kellie Shaffer is a 30 year old female who presents for Follow Up today.    Patient Active Problem List   Diagnosis Date Noted  . Seasonal allergies 11/23/2019  . Hemoglobin A1C between 7% and 9% indicating borderline diabetic control 09/28/2019  . Diarrhea 05/29/2019  . Dizziness 05/29/2019  . Type 2 diabetes mellitus without complication, without long-term current use of insulin ([place]) 11/09/2018  . Hypertension 11/09/2018  . Anxiety 11/09/2018  . Chiari malformation type I ([place]) 10/12/2018  . CIN II (cervical intraepithelial neoplasia II) 07/28/2018  . 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 Palpitations 01/12/2018  . Elevated troponin 01/12/2018  . DKA (diabetic ketoacidoses) 08/27/2017  . Hyperglycemia 08/13/2017  . Iron deficiency anemia 09/04/2016  . Hyperthyroidism 07/05/2014  . Thrombocytopenia ([place]) 05/27/2014  . Hypercholesteremia    Current Status: Since her last office visit, she is doing well with no complaints. She has not been monitoring her blood glucose levels regularly, but is aware of signs and symptoms of hyper and hypo glycemia. She denies fatigue, frequent urination, blurred vision, excessive hunger, excessive thirst, weight gain, weight loss, and poor wound healing. She continues to check her feet regularly. Her anxiety is stable today. She denies fevers, chills, fatigue, recent infections, weight loss, and night sweats. She has not had any headaches, visual changes, dizziness, and falls. No chest pain, heart palpitations, cough and shortness of  breath reported. Denies GI problems such as nausea, vomiting, diarrhea, and constipation. She has no reports of blood in stools, dysuria and hematuria. No depression or anxiety reported today. She is taking all medications as prescribed. She denies pain today.   Past Medical History:  Diagnosis Date  . Abnormal Pap smear of cervix 2019   LSIL  . Abortion May 2013  . Anemia   . Angio-edema   . Anxiety   . Diabetes mellitus without complication ([place])   . Dizziness 11/22/2019  . Hemoglobin A1c less than 7.0% 03/2020  . History of ITP   . History of palpitations    Cardiac event monitor June 2019: Normal.  Sinus rhythm no arrhythmias or significant PACs/PVCs.  Marland Kitchen Hypercholesteremia   . Hyperglycemia 03/2020  . Hypertension   . Hyperthyroidism   . Iron deficiency anemia 09/04/2016  . Peripheral vertigo   . Recurrent upper respiratory infection (URI)   . Seasonal allergies 11/22/2019  . Urticaria   . Vertigo 11/22/2019  . Vertigo     Past Surgical History:  Procedure Laterality Date  . LAPAROSCOPIC APPENDECTOMY N/A 01/14/2017   Procedure: APPENDECTOMY LAPAROSCOPIC;  Surgeon: Fanny Skates, MD;  Location: [facility];  Service: General;  Laterality: N/A;    Family History  Problem Relation Age of Onset  . Diabetes Other        Parent  . Hypertension Other        Parent  . Hyperlipidemia Other        Parent  . Arthritis Other        Parent  . Breast cancer Mother 31  Social History   Socioeconomic History  . Marital status: Single    Spouse name: Not on file  . Number of children: Not on file  . Years of education: 37  . Highest education level: Not on file  Occupational History  . Occupation: [employer]  Tobacco Use  . Smoking status: Former Smoker    Packs/day: 0.25    Years: 10.00    Pack years: 2.50    Types: Cigarettes    Start date: 2011    Quit date: 2021    Years since quitting: 1.1  . Smokeless tobacco: Never Used  Vaping Use  . Vaping Use: Never  used  Substance and Sexual Activity  . Alcohol use: Not Currently  . Drug use: No  . Sexual activity: Yes    Partners: Male    Birth control/protection: Condom, Pill  Other Topics Concern  . Not on file  Social History Narrative   Regular exercise-yes   Caffeine Use-yes         Social Determinants of Health   Financial Resource Strain: Not on file  Food Insecurity: Not on file  Transportation Needs: Not on file  Physical Activity: Not on file  Stress: Not on file  Social Connections: Not on file  Intimate Partner Violence: Not on file    Outpatient Medications Prior to Visit  Medication Sig Dispense Refill  . atorvastatin (LIPITOR) 40 MG tablet TAKE 1 TABLET DAILY 90 tablet 3  . Biotin 10000 MCG TABS Take 1,000 mcg by mouth daily.     . busPIRone (BUSPAR) 10 MG tablet TAKE 1 TABLET DAILY 90 tablet 3  . cetirizine (ZYRTEC) 10 MG tablet Take 1 tablet (10 mg total) by mouth daily as needed for allergies. 90 tablet 1  . famotidine (PEPCID) 20 MG tablet Take 20 mg by mouth 2 (two) times daily.    . ferrous sulfate 325 (65 FE) MG tablet Take 1 tablet (325 mg total) by mouth 2 (two) times daily with a meal. 60 tablet 6  . glipiZIDE (GLUCOTROL) 10 MG tablet TAKE 1 TABLET(10 MG) BY MOUTH TWICE DAILY BEFORE A MEAL 60 tablet 3  . lisinopril (ZESTRIL) 5 MG tablet TAKE 1 TABLET DAILY 90 tablet 0  . metFORMIN (GLUCOPHAGE) 500 MG tablet Take 1 tablet (500 mg total) by mouth 2 (two) times daily with a meal. 180 tablet 3  . methimazole (TAPAZOLE) 10 MG tablet TAKE 1 TABLET DAILY 90 tablet 0  . metoprolol tartrate (LOPRESSOR) 25 MG tablet TAKE ONE-HALF (1/2) TABLET EVERY MORNING 90 tablet 3  . Multiple Vitamins-Minerals (MULTIVITAMIN ADULT PO) Take by mouth daily.    . norethindrone (MICRONOR) 0.35 MG tablet Take 1 tablet (0.35 mg total) by mouth daily. 84 tablet 2  . omeprazole (PRILOSEC) 40 MG capsule Take 40 mg by mouth daily.    . Probiotic Product (PROBIOTIC-10 PO) Take by mouth. 1  caples    . meclizine (ANTIVERT) 25 MG tablet TAKE 1 TABLET(25 MG) BY MOUTH THREE TIMES DAILY AS NEEDED FOR DIZZINESS (Patient not taking: Reported on 10/02/2020) 30 tablet 3  . Acetaminophen (TYLENOL PO) Take 1 tablet by mouth as needed.  (Patient not taking: Reported on 08/21/2020)    . fluconazole (DIFLUCAN) 150 MG tablet Take 1 tablet (150 mg total) by mouth daily. (Patient not taking: Reported on 08/21/2020) 1 tablet 0   No facility-administered medications prior to visit.    Allergies  Allergen Reactions  . Benadryl [Diphenhydramine Hcl] Anaphylaxis and Hives  .  Chloroxine Hives    Clorox    ROS Review of Systems  Constitutional: Negative.   HENT: Negative.   Eyes: Negative.   Respiratory: Negative.   Cardiovascular: Negative.   Gastrointestinal: Negative.   Endocrine: Negative.   Genitourinary: Negative.   Musculoskeletal: Negative.   Skin: Negative.   Allergic/Immunologic: Negative.   Neurological: Positive for dizziness (vertigo) and headaches (occasional ).  Hematological: Negative.   Psychiatric/Behavioral: Negative.       Objective:    Physical Exam Vitals and nursing note reviewed.  Constitutional:      Appearance: Normal appearance.  HENT:     Head: Normocephalic and atraumatic.     Mouth/Throat:     Mouth: Mucous membranes are moist.     Pharynx: Oropharynx is clear.  Cardiovascular:     Rate and Rhythm: Normal rate and regular rhythm.     Pulses: Normal pulses.     Heart sounds: Normal heart sounds.  Pulmonary:     Effort: Pulmonary effort is normal.     Breath sounds: Normal breath sounds.  Abdominal:     General: Bowel sounds are normal.     Palpations: Abdomen is soft.  Musculoskeletal:        General: Normal range of motion.     Cervical back: Normal range of motion and neck supple.  Skin:    General: Skin is warm and dry.  Neurological:     General: No focal deficit present.     Mental Status: She is alert and oriented to person, place,  and time.  Psychiatric:        Mood and Affect: Mood normal.        Behavior: Behavior normal.        Thought Content: Thought content normal.        Judgment: Judgment normal.     BP (!) 148/83 (BP Location: Right Arm, Patient Position: Sitting, Cuff Size: Normal)   Pulse 66   Temp 98.8 F (37.1 C) (Temporal)   Ht '5\' 9"'  (1.753 m)   Wt 227 lb (103 kg)   LMP 09/06/2020   SpO2 99%   BMI 33.52 kg/m  Wt Readings from Last 3 Encounters:  10/02/20 227 lb (103 kg)  05/16/20 234 lb 12.8 oz (106.5 kg)  04/24/20 233 lb 9.6 oz (106 kg)     Health Maintenance Due  Topic Date Due  . Hepatitis C Screening  Never done  . PNEUMOCOCCAL POLYSACCHARIDE VACCINE AGE 35-64 HIGH RISK  Never done  . COVID-19 Vaccine (1) Never done  . FOOT EXAM  09/10/2018  . INFLUENZA VACCINE  03/17/2020  . TETANUS/TDAP  08/17/2020  . OPHTHALMOLOGY EXAM  09/05/2020    There are no preventive care reminders to display for this patient.  Lab Results  Component Value Date   TSH 2.33 04/04/2020   Lab Results  Component Value Date   WBC 8.1 12/20/2019   HGB 11.5 (L) 12/20/2019   HCT 35.4 (L) 12/20/2019   MCV 85.7 12/20/2019   PLT 188 12/20/2019   Lab Results  Component Value Date   NA 138 12/20/2019   K 3.9 12/20/2019   CHLORIDE 95 (L) 08/13/2017   CO2 24 12/20/2019   GLUCOSE 151 (H) 12/20/2019   BUN 7 12/20/2019   CREATININE 0.77 12/20/2019   BILITOT 0.7 12/20/2019   ALKPHOS 76 12/20/2019   AST 20 12/20/2019   ALT 45 (H) 12/20/2019   PROT 8.2 (H) 12/20/2019   ALBUMIN 4.1 12/20/2019  CALCIUM 9.8 12/20/2019   ANIONGAP 8 12/20/2019   EGFR >60 08/13/2017   GFR 95.33 09/20/2019   Lab Results  Component Value Date   CHOL 216 (H) 08/31/2017   Lab Results  Component Value Date   HDL 35 (L) 08/31/2017   Lab Results  Component Value Date   LDLCALC 168 (H) 08/31/2017   Lab Results  Component Value Date   TRIG 67 08/31/2017   Lab Results  Component Value Date   CHOLHDL 6.2 08/31/2017    Lab Results  Component Value Date   HGBA1C 7.1 (A) 10/02/2020   HGBA1C 7.1 10/02/2020   HGBA1C 7.1 (A) 10/02/2020   HGBA1C 7.1 (A) 10/02/2020   Assessment & Plan:   1. Annual physical exam Physical assessment within normal for age.  Follow-up for scheduled mammogram at age 20 years old.   Recommend monthly self breast exam Recommend daily multivitamin for women Recommend strength training in 150 minutes of cardiovascular exercise per week  2. Type 2 diabetes mellitus without complication, without long-term current use of insulin ([facility]) She will continue medication as prescribed, to decrease foods/beverages high in sugars and carbs and follow Heart Healthy or DASH diet. Increase physical activity to at least 30 minutes cardio exercise daily.  - POCT Glucose (Device for Home Use) - POCT glycosylated hemoglobin (Hb A1C) - POCT Urinalysis Dipstick  3. Hemoglobin A1C between 7% and 9% indicating borderline diabetic control Hgb A1c stable at 7.1 today. Monitor.   4. Hyperglycemia  5. Vertigo Stable.   6. Dizziness  7. Anxiety Stable. Continue to monitor.   8. Acute right upper quadrant pain   9. Other iron deficiency anemia  10. Healthcare maintenance - CBC with Differential - Comprehensive metabolic panel - Lipid Panel - TSH - Vitamin B12 - Vitamin D, 25-hydroxy  11. Follow up She will follow up in 1 year for Annual Physical and Labwork.   No orders of the defined types were placed in this encounter.  Orders Placed This Encounter  Procedures  . CBC with Differential  . Comprehensive metabolic panel  . Lipid Panel  . TSH  . Vitamin B12  . Vitamin D, 25-hydroxy  . POCT Glucose (Device for Home Use)  . POCT glycosylated hemoglobin (Hb A1C)  . POCT Urinalysis Dipstick    Referral Orders  No referral(s) requested today    Kathe Becton, MSN, ANE, FNP-BC [facility] [place],  [address] [phone]- fax  Problem List Items Addressed This Visit      Endocrine   Hemoglobin A1C between 7% and 9% indicating borderline diabetic control   Type 2 diabetes mellitus without complication, without long-term current use of insulin ([facility]CC)   Relevant Orders   POCT Glucose (Device for Home Use) (Completed)   POCT glycosylated hemoglobin (Hb A1C) (Completed)   POCT Urinalysis Dipstick     Other   Anxiety   Dizziness   Hyperglycemia   Iron deficiency anemia    Other Visit Diagnoses    Annual physical exam    -  Primary   Vertigo       Healthcare maintenance       Relevant Orders   CBC with Differential   Comprehensive metabolic panel   Lipid Panel   TSH   Vitamin B12   Vitamin D, 25-hydroxy   Follow up          No orders of the defined  types were placed in this encounter.   Follow-up: No follow-ups on file.    Azzie Glatter, FNP

## 2020-10-03 LAB — CBC WITH DIFFERENTIAL/PLATELET
Basophils Absolute: 0 10*3/uL (ref 0.0–0.2)
Basos: 0 %
EOS (ABSOLUTE): 0.2 10*3/uL (ref 0.0–0.4)
Eos: 3 %
Hematocrit: 36 % (ref 34.0–46.6)
Hemoglobin: 12.2 g/dL (ref 11.1–15.9)
Immature Grans (Abs): 0 10*3/uL (ref 0.0–0.1)
Immature Granulocytes: 0 %
Lymphocytes Absolute: 2 10*3/uL (ref 0.7–3.1)
Lymphs: 34 %
MCH: 27.9 pg (ref 26.6–33.0)
MCHC: 33.9 g/dL (ref 31.5–35.7)
MCV: 82 fL (ref 79–97)
Monocytes Absolute: 0.5 10*3/uL (ref 0.1–0.9)
Monocytes: 9 %
Neutrophils Absolute: 3.2 10*3/uL (ref 1.4–7.0)
Neutrophils: 54 %
Platelets: 133 10*3/uL — ABNORMAL LOW (ref 150–450)
RBC: 4.38 x10E6/uL (ref 3.77–5.28)
RDW: 12.5 % (ref 11.7–15.4)
WBC: 5.9 10*3/uL (ref 3.4–10.8)

## 2020-10-03 LAB — LIPID PANEL
Chol/HDL Ratio: 3.2 ratio (ref 0.0–4.4)
Cholesterol, Total: 99 mg/dL — ABNORMAL LOW (ref 100–199)
HDL: 31 mg/dL — ABNORMAL LOW (ref >39)
LDL Chol Calc (NIH): 52 mg/dL (ref 0–99)
Triglycerides: 79 mg/dL (ref 0–149)
VLDL Cholesterol Cal: 16 mg/dL (ref 5–40)

## 2020-10-03 LAB — COMPREHENSIVE METABOLIC PANEL
ALT: 26 IU/L (ref 0–32)
AST: 16 IU/L (ref 0–40)
Albumin/Globulin Ratio: 1.4 (ref 1.2–2.2)
Albumin: 4.8 g/dL (ref 3.9–5.0)
Alkaline Phosphatase: 75 IU/L (ref 44–121)
BUN/Creatinine Ratio: 13 (ref 9–23)
BUN: 10 mg/dL (ref 6–20)
Bilirubin Total: 0.8 mg/dL (ref 0.0–1.2)
CO2: 23 mmol/L (ref 20–29)
Calcium: 9.7 mg/dL (ref 8.7–10.2)
Chloride: 102 mmol/L (ref 96–106)
Creatinine, Ser: 0.75 mg/dL (ref 0.57–1.00)
GFR calc Af Amer: 124 mL/min/{1.73_m2} (ref >59)
GFR calc non Af Amer: 107 mL/min/{1.73_m2} (ref >59)
Globulin, Total: 3.5 g/dL (ref 1.5–4.5)
Glucose: 117 mg/dL — ABNORMAL HIGH (ref 65–99)
Potassium: 3.7 mmol/L (ref 3.5–5.2)
Sodium: 140 mmol/L (ref 134–144)
Total Protein: 8.3 g/dL (ref 6.0–8.5)

## 2020-10-03 LAB — VITAMIN D 25 HYDROXY (VIT D DEFICIENCY, FRACTURES): Vit D, 25-Hydroxy: 31.6 ng/mL (ref 30.0–100.0)

## 2020-10-03 LAB — VITAMIN B12: Vitamin B-12: 1164 pg/mL (ref 232–1245)

## 2020-10-03 LAB — TSH: TSH: 3.12 u[IU]/mL (ref 0.450–4.500)

## 2020-10-10 ENCOUNTER — Telehealth: Payer: Self-pay | Admitting: Family Medicine

## 2020-10-10 NOTE — Telephone Encounter (Signed)
Ultrasound

## 2020-10-12 ENCOUNTER — Encounter (HOSPITAL_COMMUNITY): Payer: Self-pay | Admitting: Emergency Medicine

## 2020-10-12 ENCOUNTER — Other Ambulatory Visit: Payer: Self-pay

## 2020-10-12 ENCOUNTER — Emergency Department (HOSPITAL_COMMUNITY)
Admission: EM | Admit: 2020-10-12 | Discharge: 2020-10-12 | Disposition: A | Discharge status: Home / Self Care | Payer: BC Managed Care – PPO | Attending: Emergency Medicine | Admitting: Emergency Medicine

## 2020-10-12 DIAGNOSIS — I10 Essential (primary) hypertension: Secondary | ICD-10-CM | POA: Insufficient documentation

## 2020-10-12 DIAGNOSIS — Z79899 Other long term (current) drug therapy: Secondary | ICD-10-CM | POA: Diagnosis not present

## 2020-10-12 DIAGNOSIS — R109 Unspecified abdominal pain: Secondary | ICD-10-CM | POA: Insufficient documentation

## 2020-10-12 DIAGNOSIS — Z7984 Long term (current) use of oral hypoglycemic drugs: Secondary | ICD-10-CM | POA: Diagnosis not present

## 2020-10-12 DIAGNOSIS — E1110 Type 2 diabetes mellitus with ketoacidosis without coma: Secondary | ICD-10-CM | POA: Diagnosis not present

## 2020-10-12 DIAGNOSIS — Z87891 Personal history of nicotine dependence: Secondary | ICD-10-CM | POA: Insufficient documentation

## 2020-10-12 DIAGNOSIS — R519 Headache, unspecified: Secondary | ICD-10-CM

## 2020-10-12 DIAGNOSIS — R110 Nausea: Secondary | ICD-10-CM | POA: Diagnosis not present

## 2020-10-12 LAB — CBC WITH DIFFERENTIAL/PLATELET
Abs Immature Granulocytes: 0.03 10*3/uL (ref 0.00–0.07)
Basophils Absolute: 0 10*3/uL (ref 0.0–0.1)
Basophils Relative: 0 %
Eosinophils Absolute: 0.1 10*3/uL (ref 0.0–0.5)
Eosinophils Relative: 1 %
HCT: 41.8 % (ref 36.0–46.0)
Hemoglobin: 13.5 g/dL (ref 12.0–15.0)
Immature Granulocytes: 0 %
Lymphocytes Relative: 17 %
Lymphs Abs: 1.6 10*3/uL (ref 0.7–4.0)
MCH: 28 pg (ref 26.0–34.0)
MCHC: 32.3 g/dL (ref 30.0–36.0)
MCV: 86.7 fL (ref 80.0–100.0)
Monocytes Absolute: 0.4 10*3/uL (ref 0.1–1.0)
Monocytes Relative: 4 %
Neutro Abs: 7.1 10*3/uL (ref 1.7–7.7)
Neutrophils Relative %: 78 %
Platelets: 155 10*3/uL (ref 150–400)
RBC: 4.82 MIL/uL (ref 3.87–5.11)
RDW: 13.2 % (ref 11.5–15.5)
WBC: 9.2 10*3/uL (ref 4.0–10.5)
nRBC: 0 % (ref 0.0–0.2)

## 2020-10-12 LAB — COMPREHENSIVE METABOLIC PANEL
ALT: 29 U/L (ref 0–44)
AST: 31 U/L (ref 15–41)
Albumin: 5 g/dL (ref 3.5–5.0)
Alkaline Phosphatase: 67 U/L (ref 38–126)
Anion gap: 12 (ref 5–15)
BUN: 9 mg/dL (ref 6–20)
CO2: 23 mmol/L (ref 22–32)
Calcium: 9.8 mg/dL (ref 8.9–10.3)
Chloride: 100 mmol/L (ref 98–111)
Creatinine, Ser: 0.76 mg/dL (ref 0.44–1.00)
GFR, Estimated: >60 mL/min (ref >60)
Glucose, Bld: 133 mg/dL — ABNORMAL HIGH (ref 70–99)
Potassium: 3.8 mmol/L (ref 3.5–5.1)
Sodium: 135 mmol/L (ref 135–145)
Total Bilirubin: 1.7 mg/dL — ABNORMAL HIGH (ref 0.3–1.2)
Total Protein: 9.5 g/dL — ABNORMAL HIGH (ref 6.5–8.1)

## 2020-10-12 LAB — URINALYSIS, ROUTINE W REFLEX MICROSCOPIC
Bilirubin Urine: NEGATIVE
Glucose, UA: NEGATIVE mg/dL
Hgb urine dipstick: NEGATIVE
Ketones, ur: NEGATIVE mg/dL
Leukocytes,Ua: NEGATIVE
Nitrite: NEGATIVE
Protein, ur: NEGATIVE mg/dL
Specific Gravity, Urine: 1.003 — ABNORMAL LOW (ref 1.005–1.030)
pH: 7 (ref 5.0–8.0)

## 2020-10-12 LAB — I-STAT BETA HCG BLOOD, ED (MC, WL, AP ONLY): I-stat hCG, quantitative: <5 m[IU]/mL (ref <5)

## 2020-10-12 LAB — ETHANOL: Alcohol, Ethyl (B): <10 mg/dL (ref <10)

## 2020-10-12 LAB — LIPASE, BLOOD: Lipase: 26 U/L (ref 11–51)

## 2020-10-12 MED ORDER — DICYCLOMINE HCL 10 MG PO CAPS
10.0000 mg | ORAL_CAPSULE | Freq: Once | ORAL | Status: AC
  Administered 2020-10-12: 10 mg via ORAL
  Filled 2020-10-12: qty 1

## 2020-10-12 MED ORDER — ONDANSETRON HCL 4 MG/2ML IJ SOLN
4.0000 mg | Freq: Once | INTRAMUSCULAR | Status: AC
  Administered 2020-10-12: 4 mg via INTRAVENOUS
  Filled 2020-10-12: qty 2

## 2020-10-12 MED ORDER — SODIUM CHLORIDE 0.9 % IV BOLUS
1000.0000 mL | Freq: Once | INTRAVENOUS | Status: AC
  Administered 2020-10-12: 1000 mL via INTRAVENOUS

## 2020-10-12 MED ORDER — ONDANSETRON 4 MG PO TBDP: 4.0000 mg | ORAL_TABLET | Freq: Three times a day (TID) | ORAL | 0 refills | Status: DC | PRN

## 2020-10-12 NOTE — Discharge Instructions (Signed)
As discussed, your evaluation today has been largely reassuring.  But, it is important that you monitor your condition carefully, and do not hesitate to return to the ED if you develop new, or concerning changes in your condition. ? ?Otherwise, please follow-up with your physician for appropriate ongoing care. ? ?

## 2020-10-12 NOTE — ED Provider Notes (Signed)
[facility] Note   CSN: [id number] Arrival date & time: 10/12/20  1603     History Chief Complaint  Patient presents with  . Emesis  . Diarrhea    Kellie Shaffer is a 30 y.o. female.  HPI Patient presents with concern of nausea, crampy abdominal pain, urge to defecate, and chills. Patient has multiple medical issues, but was in her usual state of health until awakening today. She denies obvious precipitant. Today, she awoke with chills, headache, describes a severe moderate migraine.  Headache is resolved, without intervention.  However, the patient has had persistent diffuse abdominal crampy pain that is intermittent, not currently present. There is associated nausea, anorexia, minimal p.o. intake.  She notes that after each episode of crampy abdominal pain she has an urge to defecate, does not have diarrhea, but does have a bowel movement. She has a history of prior appendectomy.  She has had negative home pregnancy tests.     Past Medical History:  Diagnosis Date  . Abnormal Pap smear of cervix 2019   LSIL  . Abortion May 2013  . Anemia   . Angio-edema   . Anxiety   . Diabetes mellitus without complication (HCC)   . Dizziness 11/22/2019  . Hemoglobin A1c less than 7.0% 03/2020  . History of ITP   . History of palpitations    Cardiac event monitor June 2019: Normal.  Sinus rhythm no arrhythmias or significant PACs/PVCs.  Marland Kitchen Hypercholesteremia   . Hyperglycemia 03/2020  . Hypertension   . Hyperthyroidism   . Iron deficiency anemia 09/04/2016  . Peripheral vertigo   . Recurrent upper respiratory infection (URI)   . Seasonal allergies 11/22/2019  . Urticaria   . Vertigo 11/22/2019  . Vertigo     Patient Active Problem List   Diagnosis Date Noted  . Seasonal allergies 11/23/2019  . Hemoglobin A1C between 7% and 9% indicating borderline diabetic control 09/28/2019  . Diarrhea 05/29/2019  . Dizziness 05/29/2019  .  Type 2 diabetes mellitus without complication, without long-term current use of insulin (HCC) 11/09/2018  . Hypertension 11/09/2018  . Anxiety 11/09/2018  . 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 CIN II (cervical intraepithelial neoplasia II) 07/28/2018  . 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 Palpitations 01/12/2018  . Elevated troponin 01/12/2018  . DKA (diabetic ketoacidoses) 08/27/2017  . Hyperglycemia 08/13/2017  . Iron deficiency anemia 09/04/2016  . Hyperthyroidism 07/05/2014  . Thrombocytopenia (HCC) 05/27/2014  . Hypercholesteremia     Past Surgical History:  Procedure Laterality Date  . LAPAROSCOPIC APPENDECTOMY N/A 01/14/2017   Procedure: APPENDECTOMY LAPAROSCOPIC;  Surgeon: Claud Kelp, MD;  Location: [facility];  Service: General;  Laterality: N/A;     OB History    Gravida  1   Para      Term      Preterm      AB  1   Living        SAB      IAB  1   Ectopic      Multiple      Live Births              Family History  Problem Relation Age of Onset  . Diabetes Other        Parent  . Hypertension Other        Parent  . Hyperlipidemia Other        Parent  .  Arthritis Other        Parent  . Breast cancer Mother 76    Social History   Tobacco Use  . Smoking status: Former Smoker    Packs/day: 0.25    Years: 10.00    Pack years: 2.50    Types: Cigarettes    Start date: 2011    Quit date: 2021    Years since quitting: 1.1  . Smokeless tobacco: Never Used  Vaping Use  . Vaping Use: Never used  Substance Use Topics  . Alcohol use: Not Currently  . Drug use: No    Home Medications Prior to Admission medications   Medication Sig Start Date End Date Taking? Authorizing Provider  atorvastatin (LIPITOR) 40 MG tablet TAKE 1 TABLET DAILY 10/16/19   [name], FNP  Biotin 78295 MCG TABS Take 1,000 mcg by mouth daily.     [provider]  busPIRone (BUSPAR) 10  MG tablet TAKE 1 TABLET DAILY 07/26/20   [name], FNP  cetirizine (ZYRTEC) 10 MG tablet Take 1 tablet (10 mg total) by mouth daily as needed for allergies. 08/21/20   Marcelyn Bruins, MD  famotidine (PEPCID) 20 MG tablet Take 20 mg by mouth 2 (two) times daily. 05/27/20   [provider]  ferrous sulfate 325 (65 FE) MG tablet Take 1 tablet (325 mg total) by mouth 2 (two) times daily with a meal. 04/26/20   [name], FNP  glipiZIDE (GLUCOTROL) 10 MG tablet TAKE 1 TABLET(10 MG) BY MOUTH TWICE DAILY BEFORE A MEAL 10/13/19   [name], FNP  lisinopril (ZESTRIL) 5 MG tablet TAKE 1 TABLET DAILY 07/25/20   [name], FNP  meclizine (ANTIVERT) 25 MG tablet TAKE 1 TABLET(25 MG) BY MOUTH THREE TIMES DAILY AS NEEDED FOR DIZZINESS Patient not taking: Reported on 10/02/2020 06/26/19   [name], FNP  metFORMIN (GLUCOPHAGE) 500 MG tablet Take 1 tablet (500 mg total) by mouth 2 (two) times daily with a meal. 05/29/19   [name], FNP  methimazole (TAPAZOLE) 10 MG tablet TAKE 1 TABLET DAILY 08/26/20   Romero Belling, MD  metoprolol tartrate (LOPRESSOR) 25 MG tablet TAKE ONE-HALF (1/2) TABLET EVERY MORNING 12/26/19   [name], FNP  Multiple Vitamins-Minerals (MULTIVITAMIN ADULT PO) Take by mouth daily.    [provider]  norethindrone (MICRONOR) 0.35 MG tablet Take 1 tablet (0.35 mg total) by mouth daily. 08/15/20   [name], FNP  omeprazole (PRILOSEC) 40 MG capsule Take 40 mg by mouth daily. 12/18/19   [provider]  Probiotic Product (PROBIOTIC-10 PO) Take by mouth. 1 caples    [provider]    Allergies    Benadryl [diphenhydramine hcl] and Chloroxine  Review of Systems   Review of Systems  Constitutional:       Per HPI, otherwise negative  HENT:       Per HPI, otherwise negative  Respiratory:       Per HPI, otherwise negative  Cardiovascular:       Per HPI, otherwise negative   Gastrointestinal: Positive for nausea. Negative for vomiting.  Endocrine:       Negative aside from HPI  Genitourinary:       Neg aside from HPI   Musculoskeletal:       Per HPI, otherwise negative  Skin: Negative.   Neurological: Positive for headaches. Negative for syncope.    Physical Exam Updated Vital Signs BP (!) 146/94  Pulse 70   Temp 97.9 F (36.6 C) (Oral)   Resp 16   LMP 09/06/2020   SpO2 100%   Physical Exam Vitals and nursing note reviewed.  Constitutional:      General: She is not in acute distress.    Appearance: She is well-developed and well-nourished.  HENT:     Head: Normocephalic and atraumatic.  Eyes:     Extraocular Movements: EOM normal.     Conjunctiva/sclera: Conjunctivae normal.  Cardiovascular:     Rate and Rhythm: Normal rate and regular rhythm.  Pulmonary:     Effort: Pulmonary effort is normal. No respiratory distress.     Breath sounds: Normal breath sounds. No stridor.  Abdominal:     General: There is no distension.     Tenderness: There is no abdominal tenderness. There is no guarding.  Musculoskeletal:        General: No edema.  Skin:    General: Skin is warm and dry.  Neurological:     Mental Status: She is alert and oriented to person, place, and time.     Cranial Nerves: No cranial nerve deficit.  Psychiatric:        Mood and Affect: Mood and affect normal.     ED Results / Procedures / Treatments   Labs (all labs ordered are listed, but only abnormal results are displayed) Labs Reviewed  COMPREHENSIVE METABOLIC PANEL - Abnormal; Notable for the following components:      Result Value   Glucose, Bld 133 (*)    Total Protein 9.5 (*)    Total Bilirubin 1.7 (*)    All other components within normal limits  URINALYSIS, ROUTINE W REFLEX MICROSCOPIC - Abnormal; Notable for the following components:   Color, Urine STRAW (*)    Specific Gravity, Urine 1.003 (*)    All other components within normal limits  ETHANOL   LIPASE, BLOOD  CBC WITH DIFFERENTIAL/PLATELET  I-STAT BETA HCG BLOOD, ED ([facility], [facility], [facility] ONLY)    EKG None  Radiology No results found.  Procedures Procedures 7:15 PM   Medications Ordered in ED Medications  sodium chloride 0.9 % bolus 1,000 mL (1,000 mLs Intravenous New Bag/Given 10/12/20 1711)  ondansetron (ZOFRAN) injection 4 mg (4 mg Intravenous Given 10/12/20 1711)  dicyclomine (BENTYL) capsule 10 mg (10 mg Oral Given 10/12/20 1711)    ED Course  I have reviewed the triage vital signs and the nursing notes.  Pertinent labs & imaging results that were available during my care of the patient were reviewed by me and considered in my medical decision making (see chart for details).   7:15 PM Repeat exam the patient is awake, alert, states that she feels markedly better. She has received 1 L fluid resuscitation, Bentyl, Zofran.  Labs discussed, reviewed, essentially unremarkable, not pregnant, no leukocytosis, with no peritonitis on exam, no leukocytosis or fever, low suspicion for acute abdomen, urinalysis unremarkable, low suspicion for stones, some suspicion for viral process versus medication related changes, or complications of her migraine. Patient comfortable with return precautions, discharge. Final Clinical Impression(s) / ED Diagnoses Final diagnoses:  Nausea  Bad headache    Rx / DC Orders ED Discharge Orders         Ordered    ondansetron (ZOFRAN ODT) 4 MG disintegrating tablet  Every 8 hours PRN        10/12/20 1916           Gerhard Munch, MD 10/12/20 [phone]

## 2020-10-12 NOTE — ED Triage Notes (Signed)
Patient reports vomiting with two episodes of diarrhea today and headache.

## 2020-10-15 ENCOUNTER — Other Ambulatory Visit: Payer: Self-pay | Admitting: Family Medicine

## 2020-10-16 ENCOUNTER — Ambulatory Visit: Payer: BC Managed Care – PPO | Admitting: Family Medicine

## 2020-10-16 ENCOUNTER — Ambulatory Visit (HOSPITAL_COMMUNITY): Payer: BC Managed Care – PPO

## 2020-11-25 ENCOUNTER — Other Ambulatory Visit: Payer: Self-pay | Admitting: Endocrinology

## 2020-12-13 ENCOUNTER — Telehealth (INDEPENDENT_AMBULATORY_CARE_PROVIDER_SITE_OTHER): Payer: BC Managed Care – PPO | Admitting: Nurse Practitioner

## 2020-12-13 ENCOUNTER — Encounter: Payer: Self-pay | Admitting: Nurse Practitioner

## 2020-12-13 DIAGNOSIS — E119 Type 2 diabetes mellitus without complications: Secondary | ICD-10-CM | POA: Diagnosis not present

## 2020-12-13 DIAGNOSIS — N871 Moderate cervical dysplasia: Secondary | ICD-10-CM | POA: Diagnosis not present

## 2020-12-13 DIAGNOSIS — R42 Dizziness and giddiness: Secondary | ICD-10-CM

## 2020-12-13 NOTE — Progress Notes (Signed)
   [facility] [address] [place], [place]  [address] Phone:  [phone]   Fax:  [phone] Virtual Visit via Video Note  I connected with Kellie Shaffer on 12/13/20 at  3:55 PM EDT by video and verified that I am speaking with the correct person using two identifiers.   I discussed the limitations, risks, security and privacy concerns of performing an evaluation and management service by telephone and the availability of in person appointments. I also discussed with the patient that there may be a patient responsible charge related to this service. The patient expressed understanding and agreed to proceed.  Patient home Provider Office  History of Present Illness: She has FMLA forms that needs to be complete. She  has a past medical history of Abnormal Pap smear of cervix (2019), Abortion (May 2013), Anemia, Angio-edema, Anxiety, Diabetes mellitus without complication (HCC), Dizziness (11/22/2019), Hemoglobin A1c less than 7.0% (03/2020), History of ITP, History of palpitations, Hypercholesteremia, Hyperglycemia (03/2020), Hypertension, Hyperthyroidism, Iron deficiency anemia (09/04/2016), Peripheral vertigo, Recurrent upper respiratory infection (URI), Seasonal allergies (11/22/2019), Urticaria, Vertigo (11/22/2019), and Vertigo.   She has vertigo and is currently on a on meclizine 25 mg daily.  She feels like the meclizine causes her drowsiness and she is unable to work during this timeframe.  Due to her work home status she has to take time off when away from her station.  She reports complete work-up with ENT and allergy.  She denies any treatment with physical therapy.  Vertigo was contributing to allergies for which she is currently taking Zyrtec She has diabetes and is currently on metformin along with glipizide and has bouts of diarrhea..  She is monitoring her CBG at home and her range is 70s to low 200s.  She denies any other symptoms at this  time.  Observations/Objective: Virtual visit no exam  Assessment and Plan: Assessment  Primary Diagnosis & Pertinent Problem List: The primary encounter diagnosis was Vertigo. Diagnoses of Type 2 diabetes mellitus without complication, without long-term current use of insulin (HCC) and CIN II (cervical intraepithelial neoplasia II) were also pertinent to this visit.  Visit Diagnosis: 1. Vertigo persistent daily FMLA paperwork completed, will continue with current regimen  2. Type 2 diabetes mellitus without complication, without long-term current use of insulin (HCC)  Persistent FMLA paperwork needed continue with current regimen  3. CIN II (cervical intraepithelial neoplasia II) referral placed for a form evaluation    Follow Up Instructions:    I discussed the assessment and treatment plan with the patient. The patient was provided an opportunity to ask questions and all were answered. The patient agreed with the plan and demonstrated an understanding of the instructions.   The patient was advised to call back or seek an in-person evaluation if the symptoms worsen or if the condition fails to improve as anticipated.  I provided 13 minutes of video- face-to-face time during this encounter.   Barbette Merino, NP

## 2020-12-16 ENCOUNTER — Telehealth: Payer: Self-pay

## 2020-12-16 NOTE — Telephone Encounter (Signed)
Pt asking about the paperwork for her job

## 2020-12-16 NOTE — Telephone Encounter (Signed)
Ready to be faxed.

## 2021-01-09 ENCOUNTER — Other Ambulatory Visit (HOSPITAL_COMMUNITY)
Admission: RE | Admit: 2021-01-09 | Discharge: 2021-01-09 | Disposition: A | Discharge status: Home / Self Care | Payer: BC Managed Care – PPO | Source: Ambulatory Visit | Attending: Obstetrics and Gynecology | Admitting: Obstetrics and Gynecology

## 2021-01-09 ENCOUNTER — Other Ambulatory Visit: Payer: Self-pay

## 2021-01-09 ENCOUNTER — Encounter: Payer: Self-pay | Admitting: Obstetrics and Gynecology

## 2021-01-09 ENCOUNTER — Ambulatory Visit (INDEPENDENT_AMBULATORY_CARE_PROVIDER_SITE_OTHER): Payer: BC Managed Care – PPO | Admitting: Obstetrics and Gynecology

## 2021-01-09 VITALS — BP 140/82 | HR 63 | Ht 69.0 in | Wt 228.0 lb

## 2021-01-09 DIAGNOSIS — N912 Amenorrhea, unspecified: Secondary | ICD-10-CM | POA: Diagnosis not present

## 2021-01-09 DIAGNOSIS — Z01419 Encounter for gynecological examination (general) (routine) without abnormal findings: Secondary | ICD-10-CM | POA: Insufficient documentation

## 2021-01-09 DIAGNOSIS — Z832 Family history of diseases of the blood and blood-forming organs and certain disorders involving the immune mechanism: Secondary | ICD-10-CM | POA: Diagnosis not present

## 2021-01-09 DIAGNOSIS — Z803 Family history of malignant neoplasm of breast: Secondary | ICD-10-CM | POA: Diagnosis not present

## 2021-01-09 MED ORDER — NORETHINDRONE 0.35 MG PO TABS: 1.0000 | ORAL_TABLET | Freq: Every day | ORAL | 3 refills | Status: DC

## 2021-01-09 NOTE — Patient Instructions (Signed)
EXERCISE AND DIET:  We recommended that you start or continue a regular exercise program for good health. Regular exercise means any activity that makes your heart beat faster and makes you sweat.  We recommend exercising at least 30 minutes per day at least 3 days a week, preferably 4 or 5.  We also recommend a diet low in fat and sugar.  Inactivity, poor dietary choices and obesity can cause diabetes, heart attack, stroke, and kidney damage, among others.    ALCOHOL AND SMOKING:  Women should limit their alcohol intake to no more than 7 drinks/beers/glasses of wine (combined, not each!) per week. Moderation of alcohol intake to this level decreases your risk of breast cancer and liver damage. And of course, no recreational drugs are part of a healthy lifestyle.  And absolutely no smoking or even second hand smoke. Most people know smoking can cause heart and lung diseases, but did you know it also contributes to weakening of your bones? Aging of your skin?  Yellowing of your teeth and nails?  CALCIUM AND VITAMIN D:  Adequate intake of calcium and Vitamin D are recommended.  The recommendations for exact amounts of these supplements seem to change often, but generally speaking 600 mg of calcium (either carbonate or citrate) and 800 units of Vitamin D per day seems prudent. Certain women may benefit from higher intake of Vitamin D.  If you are among these women, your doctor will have told you during your visit.    PAP SMEARS:  Pap smears, to check for cervical cancer or precancers,  have traditionally been done yearly, although recent scientific advances have shown that most women can have pap smears less often.  However, every woman still should have a physical exam from her gynecologist every year. It will include a breast check, inspection of the vulva and vagina to check for abnormal growths or skin changes, a visual exam of the cervix, and then an exam to evaluate the size and shape of the uterus and  ovaries.  And after 31 years of age, a rectal exam is indicated to check for rectal cancers. We will also provide age appropriate advice regarding health maintenance, like when you should have certain vaccines, screening for sexually transmitted diseases, bone density testing, colonoscopy, mammograms, etc.   MAMMOGRAMS:  All women over 40 years old should have a yearly mammogram. Many facilities now offer a "3D" mammogram, which may cost around $50 extra out of pocket. If possible,  we recommend you accept the option to have the 3D mammogram performed.  It both reduces the number of women who will be called back for extra views which then turn out to be normal, and it is better than the routine mammogram at detecting truly abnormal areas.    COLONOSCOPY:  Colonoscopy to screen for colon cancer is recommended for all women at age 50.  We know, you hate the idea of the prep.  We agree, BUT, having colon cancer and not knowing it is worse!!  Colon cancer so often starts as a polyp that can be seen and removed at colonscopy, which can quite literally save your life!  And if your first colonoscopy is normal and you have no family history of colon cancer, most women don't have to have it again for 10 years.  Once every ten years, you can do something that may end up saving your life, right?  We will be happy to help you get it scheduled when you are ready.    Be sure to check your insurance coverage so you understand how much it will cost.  It may be covered as a preventative service at no cost, but you should check your particular policy.     Medroxyprogesterone injection [Contraceptive] What is this medicine? MEDROXYPROGESTERONE (me DROX ee proe JES te rone) contraceptive injections prevent pregnancy. They provide effective birth control for 3 months. Depo-SubQ Provera 104 injection is also used for treating pain related to endometriosis. This medicine may be used for other purposes; ask your health care  provider or pharmacist if you have questions. COMMON BRAND NAME(S): Depo-Provera, Depo-subQ Provera 104 What should I tell my health care provider before I take this medicine? They need to know if you have any of these conditions:  asthma  blood clots  breast cancer or family history of breast cancer  depression  diabetes  eating disorder (anorexia nervosa)  heart attack  high blood pressure  HIV infection or AIDS  if you often drink alcohol  kidney disease  liver disease  migraine headaches  osteoporosis, weak bones  seizures  stroke  tobacco smoker  vaginal bleeding  an unusual or allergic reaction to medroxyprogesterone, other hormones, medicines, foods, dyes, or preservatives  pregnant or trying to get pregnant  breast-feeding How should I use this medicine? Depo-Provera CI contraceptive injection is given into a muscle. Depo-subQ Provera 104 injection is given under the skin. It is given by a health care provider in a hospital or clinic setting. The injection is usually given during the first 5 days after the start of a menstrual period or 6 weeks after delivery of a baby. A patient package insert for the product will be given with each prescription and refill. Be sure to read this information carefully each time. The sheet may change often. Talk to your pediatrician regarding the use of this medicine in children. Special care may be needed. These injections have been used in female children who have started having menstrual periods. Overdosage: If you think you have taken too much of this medicine contact a poison control center or emergency room at once. NOTE: This medicine is only for you. Do not share this medicine with others. What if I miss a dose? Keep appointments for follow-up doses. You must get an injection once every 3 months. It is important not to miss your dose. Call your health care provider if you are unable to keep an appointment. What may  interact with this medicine?  antibiotics or medicines for infections, especially rifampin and griseofulvin  antivirals for HIV or hepatitis  aprepitant  armodafinil  bexarotene  bosentan  medicines for seizures like carbamazepine, felbamate, oxcarbazepine, phenytoin, phenobarbital, primidone, topiramate  mitotane  modafinil  St. John's wort This list may not describe all possible interactions. Give your health care provider a list of all the medicines, herbs, non-prescription drugs, or dietary supplements you use. Also tell them if you smoke, drink alcohol, or use illegal drugs. Some items may interact with your medicine. What should I watch for while using this medicine? This drug does not protect you against HIV infection (AIDS) or other sexually transmitted diseases. Use of this product may cause you to lose calcium from your bones. Loss of calcium may cause weak bones (osteoporosis). Only use this product for more than 2 years if other forms of birth control are not right for you. The longer you use this product for birth control the more likely you will be at risk for weak bones. Ask  your health care professional how you can keep strong bones. You may have a change in bleeding pattern or irregular periods. Many females stop having periods while taking this drug. If you have received your injections on time, your chance of being pregnant is very low. If you think you may be pregnant, see your health care professional as soon as possible. Tell your health care professional if you want to get pregnant within the next year. The effect of this medicine may last a long time after you get your last injection. What side effects may I notice from receiving this medicine? Side effects that you should report to your doctor or health care professional as soon as possible:  allergic reactions like skin rash, itching or hives, swelling of the face, lips, or tongue  blood clot (chest pain;  shortness of breath; pain, swelling, or warmth in the leg)  breast tenderness or discharge  changes in emotions or moods  changes in vision  liver injury (dark yellow or brown urine; general ill feeling or flu-like symptoms; loss of appetite, right upper belly pain; unusually weak or tired, yellowing of the eyes or skin)  persistent pain, pus, or bleeding at the injection site  stroke (changes in vision; confusion; trouble speaking or understanding; severe headaches; sudden numbness or weakness of the face, arm or leg; trouble walking; dizziness; loss of balance or coordination)  trouble breathing Side effects that usually do not require medical attention (report to your doctor or health care professional if they continue or are bothersome):  change in sex drive  dizziness  fluid retention  headache  irregular periods, spotting, or absent periods  pain, redness, or irritation at site where injected  stomach pain  weight gain This list may not describe all possible side effects. Call your doctor for medical advice about side effects. You may report side effects to FDA at 1-800-FDA-1088. Where should I keep my medicine? This injection is only given by a health care provider. It will not be stored at home. NOTE: This sheet is a summary. It may not cover all possible information. If you have questions about this medicine, talk to your doctor, pharmacist, or health care provider.  2021 Elsevier/Gold Standard (2019-09-20 10:29:21)

## 2021-01-09 NOTE — Progress Notes (Deleted)
GYNECOLOGY  VISIT   HPI: 30 y.o.   Single  African American  female   G1P0010 with No LMP recorded.   here for follow up for abnormal pap smear.   GYNECOLOGIC HISTORY: No LMP recorded. 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 06-03-18 colpo CIN I, poss. CIN II. 05-11-18 LGSIL, 01-03-18 LGSIL        OB History    Gravida  1   Para      Term      Preterm      AB  1   Living        SAB      IAB  1   Ectopic      Multiple      Live Births                 Patient Active Problem List   Diagnosis Date Noted  . Seasonal allergies 11/23/2019  . Hemoglobin A1C between 7% and 9% indicating borderline diabetic control 09/28/2019  . Diarrhea 05/29/2019  . Dizziness 05/29/2019  . Type 2 diabetes mellitus without complication, without long-term current use of insulin (HCC) 11/09/2018  . Hypertension 11/09/2018  . Anxiety 11/09/2018  . 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 CIN II (cervical intraepithelial neoplasia II) 07/28/2018  . 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 Palpitations 01/12/2018  . Elevated troponin 01/12/2018  . DKA (diabetic ketoacidoses) 08/27/2017  . Hyperglycemia 08/13/2017  . Iron deficiency anemia 09/04/2016  . Hyperthyroidism 07/05/2014  . Thrombocytopenia (HCC) 05/27/2014  . Hypercholesteremia     Past Medical History:  Diagnosis Date  . Abnormal Pap smear of cervix 2019   LSIL  . Abortion May 2013  . Anemia   . Angio-edema   . Anxiety   . Diabetes mellitus without complication (HCC)   . Dizziness 11/22/2019  . Hemoglobin A1c less than 7.0% 03/2020  . History of ITP   . History of palpitations    Cardiac event monitor June 2019: Normal.  Sinus rhythm no arrhythmias or significant PACs/PVCs.  Marland Kitchen Hypercholesteremia   . Hyperglycemia 03/2020  . Hypertension   .  Hyperthyroidism   . Iron deficiency anemia 09/04/2016  . Peripheral vertigo   . Recurrent upper respiratory infection (URI)   . Seasonal allergies 11/22/2019  . Urticaria   . Vertigo 11/22/2019  . Vertigo     Past Surgical History:  Procedure Laterality Date  . LAPAROSCOPIC APPENDECTOMY N/A 01/14/2017   Procedure: APPENDECTOMY LAPAROSCOPIC;  Surgeon: Claud Kelp, MD;  Location: [facility];  Service: General;  Laterality: N/A;    Current Outpatient Medications  Medication Sig Dispense Refill  . atorvastatin (LIPITOR) 40 MG tablet TAKE 1 TABLET DAILY 90 tablet 3  . Biotin 62563 MCG TABS Take 1,000 mcg by mouth daily.     . busPIRone (BUSPAR) 10 MG tablet TAKE 1 TABLET DAILY 90 tablet 3  . cetirizine (ZYRTEC) 10 MG tablet Take 1 tablet (10 mg total) by mouth daily as needed for allergies. 90 tablet 1  . famotidine (PEPCID) 20 MG tablet Take 20 mg by mouth 2 (two) times daily.    . ferrous sulfate 325 (65 FE) MG tablet Take 1 tablet (325 mg total) by mouth 2 (two) times daily with a meal. 60 tablet 6  . glipiZIDE (GLUCOTROL) 10 MG tablet TAKE 1 TABLET(10 MG)  BY MOUTH TWICE DAILY BEFORE A MEAL 60 tablet 3  . lisinopril (ZESTRIL) 5 MG tablet TAKE 1 TABLET DAILY 90 tablet 3  . meclizine (ANTIVERT) 25 MG tablet TAKE 1 TABLET(25 MG) BY MOUTH THREE TIMES DAILY AS NEEDED FOR DIZZINESS (Patient not taking: Reported on 10/02/2020) 30 tablet 3  . metFORMIN (GLUCOPHAGE) 500 MG tablet Take 1 tablet (500 mg total) by mouth 2 (two) times daily with a meal. 180 tablet 3  . methimazole (TAPAZOLE) 10 MG tablet TAKE 1 TABLET DAILY 90 tablet 3  . metoprolol tartrate (LOPRESSOR) 25 MG tablet TAKE ONE-HALF (1/2) TABLET EVERY MORNING 90 tablet 3  . Multiple Vitamins-Minerals (MULTIVITAMIN ADULT PO) Take by mouth daily.    . norethindrone (MICRONOR) 0.35 MG tablet Take 1 tablet (0.35 mg total) by mouth daily. 84 tablet 2  . omeprazole (PRILOSEC) 40 MG capsule Take 40 mg by mouth daily.    . ondansetron (ZOFRAN  ODT) 4 MG disintegrating tablet Take 1 tablet (4 mg total) by mouth every 8 (eight) hours as needed for nausea or vomiting. 20 tablet 0  . Probiotic Product (PROBIOTIC-10 PO) Take by mouth. 1 caples     No current facility-administered medications for this visit.     ALLERGIES: Benadryl [diphenhydramine hcl] and Chloroxine  Family History  Problem Relation Age of Onset  . Diabetes Other        Parent  . Hypertension Other        Parent  . Hyperlipidemia Other        Parent  . Arthritis Other        Parent  . Breast cancer Mother 70    Social History   Socioeconomic History  . Marital status: Single    Spouse name: Not on file  . Number of children: Not on file  . Years of education: 62  . Highest education level: Not on file  Occupational History  . Occupation: [employer]  Tobacco Use  . Smoking status: Former Smoker    Packs/day: 0.25    Years: 10.00    Pack years: 2.50    Types: Cigarettes    Start date: 2011    Quit date: 2021    Years since quitting: 1.3  . Smokeless tobacco: Never Used  Vaping Use  . Vaping Use: Never used  Substance and Sexual Activity  . Alcohol use: Not Currently  . Drug use: No  . Sexual activity: Yes    Partners: Male    Birth control/protection: Condom, Pill  Other Topics Concern  . Not on file  Social History Narrative   Regular exercise-yes   Caffeine Use-yes         Social Determinants of Health   Financial Resource Strain: Not on file  Food Insecurity: Not on file  Transportation Needs: Not on file  Physical Activity: Not on file  Stress: Not on file  Social Connections: Not on file  Intimate Partner Violence: Not on file    Review of Systems  PHYSICAL EXAMINATION:    There were no vitals taken for this visit.    General appearance: alert, cooperative and appears stated age Head: Normocephalic, without obvious abnormality, atraumatic Neck: no adenopathy, supple, symmetrical, trachea midline and thyroid  normal to inspection and palpation Lungs: clear to auscultation bilaterally Breasts: normal appearance, no masses or tenderness, No nipple retraction or dimpling, No nipple discharge or bleeding, No axillary or supraclavicular adenopathy Heart: regular rate and rhythm Abdomen: soft, non-tender, no masses,  no organomegaly  Extremities: extremities normal, atraumatic, no cyanosis or edema Skin: Skin color, texture, turgor normal. No rashes or lesions Lymph nodes: Cervical, supraclavicular, and axillary nodes normal. No abnormal inguinal nodes palpated Neurologic: Grossly normal  Pelvic: External genitalia:  no lesions              Urethra:  normal appearing urethra with no masses, tenderness or lesions              Bartholins and Skenes: normal                 Vagina: normal appearing vagina with normal color and discharge, no lesions              Cervix: no lesions                Bimanual Exam:  Uterus:  normal size, contour, position, consistency, mobility, non-tender              Adnexa: no mass, fullness, tenderness              Rectal exam: {yes no:[id number]}.  Confirms.              Anus:  normal sphincter tone, no lesions  Chaperone was present for exam.  ASSESSMENT     PLAN     An After Visit Summary was printed and given to the patient.  ______ minutes face to face time of which over 50% was spent in counseling.

## 2021-01-09 NOTE — Progress Notes (Signed)
30 y.o. G27P0010 Single African American female here for annual exam.    Declines childbearing at this time.  No new partner.  Declines STD testing.   Using POPs as her blood pressure went up on COCs.  Using this for pregnancy prevention and cycle regulation.  Has a lot of cramping.  Uses ibuprofen 800 mg and up to 1000 mg last month and heat.  No Covid vaccination.   PCP:  Thad Ranger, NP  Patient's last menstrual period was 12/23/2020 (exact date).     Period Cycle (Days): 30 Period Duration (Days): 7 Period Pattern: Regular Menstrual Flow:  (heavy first 3 days then tapers) Menstrual Control: Tampon,Maxi pad Menstrual Control Change Freq (Hours): changes overnight pad and super tampon every 3-4 hours on heaviest day Dysmenorrhea: (!) Severe Dysmenorrhea Symptoms: Cramping     Sexually active: Yes.    The current method of family planning is  oral progesterone-only contraceptive.    Exercising: No.  The patient does not participate in regular exercise at present. 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 06-03-18 colpo CIN I, poss. CIN II. 05-11-18 LGSIL MMG: 2-3  Years ago per patient--?normal Colonoscopy:  n/a BMD:   n/a  Result  n/a TDaP:  PCP Gardasil:   Yes, completed HIV: Neg in past Hep C:Neg in pat Screening Labs:  PCP   reports that she quit smoking about 16 months ago. Her smoking use included cigarettes. She started smoking about 11 years ago. She has a 2.50 pack-year smoking history. She has never used smokeless tobacco. She reports current alcohol use. She reports that she does not use drugs.  Past Medical History:  Diagnosis Date  . Abnormal Pap smear of cervix 2019   LSIL  . Abortion May 2013  . Anemia   . Angio-edema   . Anxiety   . Diabetes mellitus without complication (HCC)   . Dizziness 11/22/2019  . Hemoglobin A1c  less than 7.0% 03/2020  . History of ITP   . History of palpitations    Cardiac event monitor June 2019: Normal.  Sinus rhythm no arrhythmias or significant PACs/PVCs.  Marland Kitchen Hypercholesteremia   . Hyperglycemia 03/2020  . Hypertension   . Hyperthyroidism   . Iron deficiency anemia 09/04/2016  . Peripheral vertigo   . Recurrent upper respiratory infection (URI)   . Seasonal allergies 11/22/2019  . Urticaria   . Vertigo 11/22/2019  . Vertigo     Past Surgical History:  Procedure Laterality Date  . LAPAROSCOPIC APPENDECTOMY N/A 01/14/2017   Procedure: APPENDECTOMY LAPAROSCOPIC;  Surgeon: Claud Kelp, MD;  Location: [facility];  Service: General;  Laterality: N/A;    Current Outpatient Medications  Medication Sig Dispense Refill  . atorvastatin (LIPITOR) 40 MG tablet TAKE 1 TABLET DAILY 90 tablet 3  . Biotin 88280 MCG TABS Take 1,000 mcg by mouth daily.     . busPIRone (BUSPAR) 10 MG tablet TAKE 1 TABLET DAILY 90 tablet 3  . cetirizine (ZYRTEC) 10 MG tablet Take 1 tablet (10 mg total) by mouth daily as needed for allergies. 90 tablet 1  . famotidine (PEPCID) 20 MG tablet Take 20 mg by mouth 2 (two) times daily.    . ferrous sulfate 325 (65 FE) MG tablet Take 1 tablet (325 mg total) by mouth 2 (two) times daily with a meal. 60  tablet 6  . glipiZIDE (GLUCOTROL) 10 MG tablet TAKE 1 TABLET(10 MG) BY MOUTH TWICE DAILY BEFORE A MEAL 60 tablet 3  . lisinopril (ZESTRIL) 5 MG tablet TAKE 1 TABLET DAILY 90 tablet 3  . meclizine (ANTIVERT) 25 MG tablet TAKE 1 TABLET(25 MG) BY MOUTH THREE TIMES DAILY AS NEEDED FOR DIZZINESS 30 tablet 3  . metFORMIN (GLUCOPHAGE) 500 MG tablet Take 1 tablet (500 mg total) by mouth 2 (two) times daily with a meal. 180 tablet 3  . methimazole (TAPAZOLE) 10 MG tablet TAKE 1 TABLET DAILY 90 tablet 3  . metoprolol tartrate (LOPRESSOR) 25 MG tablet TAKE ONE-HALF (1/2) TABLET EVERY MORNING 90 tablet 3  . Multiple Vitamins-Minerals (MULTIVITAMIN ADULT PO) Take by mouth daily.     . norethindrone (MICRONOR) 0.35 MG tablet Take 1 tablet (0.35 mg total) by mouth daily. 84 tablet 2  . omeprazole (PRILOSEC) 40 MG capsule Take 40 mg by mouth daily.    . ondansetron (ZOFRAN ODT) 4 MG disintegrating tablet Take 1 tablet (4 mg total) by mouth every 8 (eight) hours as needed for nausea or vomiting. 20 tablet 0  . Probiotic Product (PROBIOTIC-10 PO) Take by mouth. 1 caples     No current facility-administered medications for this visit.    Family History  Problem Relation Age of Onset  . Diabetes Other        Parent  . Hypertension Other        Parent  . Hyperlipidemia Other        Parent  . Arthritis Other        Parent  . Breast cancer Mother 76    Review of Systems  All other systems reviewed and are negative.   Exam:   BP 140/82 (Cuff Size: Large)   Pulse 63   Ht 5\' 9"  (1.753 m)   Wt 228 lb (103.4 kg)   LMP 12/23/2020 (Exact Date)   SpO2 98%   BMI 33.67 kg/m     General appearance: alert, cooperative and appears stated age Head: normocephalic, without obvious abnormality, atraumatic Neck: no adenopathy, supple, symmetrical, trachea midline and thyroid normal to inspection and palpation Lungs: clear to auscultation bilaterally Breasts: normal appearance, no masses or tenderness, No nipple retraction or dimpling, No nipple discharge or bleeding, No axillary adenopathy Heart: regular rate and rhythm Abdomen: soft, non-tender; no masses, no organomegaly Extremities: extremities normal, atraumatic, no cyanosis or edema Skin: skin color, texture, turgor normal. No rashes or lesions Lymph nodes: cervical, supraclavicular, and axillary nodes normal. Neurologic: grossly normal  Pelvic: External genitalia:  no lesions              No abnormal inguinal nodes palpated.              Urethra:  normal appearing urethra with no masses, tenderness or lesions              Bartholins and Skenes: normal                 Vagina: normal appearing vagina with normal  color and discharge, no lesions              Cervix: no lesions              Pap taken: Yes.   Bimanual Exam:  Uterus:  normal size, contour, position, consistency, mobility, non-tender              Adnexa: no mass, fullness, tenderness  Chaperone was present for exam.  Assessment:   Well woman visit with normal exam. Status post LEEP with positive margin - CIN II and CIN III with positive margin.  On Micronor. DM with hx DKA.  FH of breast cancer in mother age 44 yo.  FH of sickle cell trait.  ITP.   Plan: Mammogram screening discussed. Self breast awareness reviewed. Pap and HR HPV as above. Guidelines for Calcium, Vitamin D, regular exercise program including cardiovascular and weight bearing exercise. Will refill Micronor for now. We discussed Depo Provera and Mirena IUD alternatives.  Referral to genetics specialist.  We discussed Covid vaccination.  Follow up annually and prn.

## 2021-01-10 LAB — CYTOLOGY - PAP
Comment: NEGATIVE
Diagnosis: NEGATIVE
High risk HPV: NEGATIVE

## 2021-01-16 ENCOUNTER — Ambulatory Visit: Payer: BC Managed Care – PPO | Admitting: Nurse Practitioner

## 2021-01-29 ENCOUNTER — Other Ambulatory Visit: Payer: Self-pay | Admitting: Nurse Practitioner

## 2021-01-29 DIAGNOSIS — D508 Other iron deficiency anemias: Secondary | ICD-10-CM

## 2021-01-29 MED ORDER — FERROUS SULFATE 325 (65 FE) MG PO TABS: 325.0000 mg | ORAL_TABLET | Freq: Two times a day (BID) | ORAL | 6 refills | Status: DC

## 2021-02-28 ENCOUNTER — Ambulatory Visit: Payer: BC Managed Care – PPO | Admitting: Nurse Practitioner

## 2021-03-06 ENCOUNTER — Ambulatory Visit: Payer: BC Managed Care – PPO | Admitting: Nurse Practitioner

## 2021-03-25 ENCOUNTER — Telehealth: Payer: Self-pay

## 2021-03-25 ENCOUNTER — Other Ambulatory Visit: Payer: Self-pay

## 2021-03-25 MED ORDER — MEDROXYPROGESTERONE ACETATE 150 MG/ML IM SUSP: 150.0000 mg | Freq: Once | INTRAMUSCULAR | Status: DC

## 2021-03-25 NOTE — Progress Notes (Signed)
D

## 2021-03-25 NOTE — Telephone Encounter (Signed)
Patient called back with pharmacy. Rx sent. Lab appointment schedule for 03/26/21 at 4:00 Left message in voice mail regarding appt date/time and to call and r/s if not acceptable with her schedule and that Rx sent to her pharmacy to pick up and bring with her to office for injection.

## 2021-03-25 NOTE — Telephone Encounter (Signed)
Patient has decided she would like to proceed with Depo-Provera injection.   At 01/09/21 AEX note "Will refill Micronor for now. We discussed Depo Provera and Mirena IUD alternatives."

## 2021-03-25 NOTE — Telephone Encounter (Signed)
Ok to discontinue Micronor and start Depo Provera 150 mg IM q 3 months until annual exam is due.

## 2021-03-25 NOTE — Telephone Encounter (Signed)
Left message that Dr. Edward Jolly ordered D-P inj for her and I need pharmacy that she would like to pick it up at. SHe will pick it up and bring to the office for nurse appt/injection.  I provided her the hours and asked her to let me know when she would like to come by. (She had told me in voice mail message she works all day and cannot get calls. )

## 2021-03-26 ENCOUNTER — Other Ambulatory Visit: Payer: Self-pay

## 2021-03-26 ENCOUNTER — Ambulatory Visit (INDEPENDENT_AMBULATORY_CARE_PROVIDER_SITE_OTHER): Payer: BC Managed Care – PPO | Admitting: Gynecology

## 2021-03-26 DIAGNOSIS — Z3042 Encounter for surveillance of injectable contraceptive: Secondary | ICD-10-CM | POA: Diagnosis not present

## 2021-03-26 DIAGNOSIS — Z01812 Encounter for preprocedural laboratory examination: Secondary | ICD-10-CM | POA: Diagnosis not present

## 2021-03-26 LAB — PREGNANCY, URINE: Preg Test, Ur: NEGATIVE

## 2021-03-26 MED ORDER — METOPROLOL TARTRATE 25 MG PO TABS: ORAL_TABLET | ORAL | 3 refills | Status: DC

## 2021-03-26 MED ORDER — MEDROXYPROGESTERONE ACETATE 150 MG/ML IM SUSP
150.0000 mg | Freq: Once | INTRAMUSCULAR | Status: AC
  Administered 2021-03-26: 150 mg via INTRAMUSCULAR

## 2021-05-06 ENCOUNTER — Emergency Department (HOSPITAL_BASED_OUTPATIENT_CLINIC_OR_DEPARTMENT_OTHER)
Admission: EM | Admit: 2021-05-06 | Discharge: 2021-05-06 | Disposition: A | Discharge status: Home / Self Care | Payer: BC Managed Care – PPO | Attending: Emergency Medicine | Admitting: Emergency Medicine

## 2021-05-06 ENCOUNTER — Encounter (HOSPITAL_BASED_OUTPATIENT_CLINIC_OR_DEPARTMENT_OTHER): Payer: Self-pay | Admitting: Emergency Medicine

## 2021-05-06 ENCOUNTER — Other Ambulatory Visit: Payer: Self-pay

## 2021-05-06 DIAGNOSIS — E1110 Type 2 diabetes mellitus with ketoacidosis without coma: Secondary | ICD-10-CM | POA: Diagnosis not present

## 2021-05-06 DIAGNOSIS — Z79899 Other long term (current) drug therapy: Secondary | ICD-10-CM | POA: Insufficient documentation

## 2021-05-06 DIAGNOSIS — Z87891 Personal history of nicotine dependence: Secondary | ICD-10-CM | POA: Diagnosis not present

## 2021-05-06 DIAGNOSIS — Z7984 Long term (current) use of oral hypoglycemic drugs: Secondary | ICD-10-CM | POA: Insufficient documentation

## 2021-05-06 DIAGNOSIS — D696 Thrombocytopenia, unspecified: Secondary | ICD-10-CM | POA: Diagnosis not present

## 2021-05-06 DIAGNOSIS — I10 Essential (primary) hypertension: Secondary | ICD-10-CM | POA: Diagnosis not present

## 2021-05-06 DIAGNOSIS — R002 Palpitations: Secondary | ICD-10-CM | POA: Diagnosis not present

## 2021-05-06 DIAGNOSIS — R5383 Other fatigue: Secondary | ICD-10-CM | POA: Diagnosis not present

## 2021-05-06 LAB — CBC WITH DIFFERENTIAL/PLATELET
Abs Immature Granulocytes: 0.02 10*3/uL (ref 0.00–0.07)
Basophils Absolute: 0 10*3/uL (ref 0.0–0.1)
Basophils Relative: 0 %
Eosinophils Absolute: 0.1 10*3/uL (ref 0.0–0.5)
Eosinophils Relative: 1 %
HCT: 37.2 % (ref 36.0–46.0)
Hemoglobin: 12.3 g/dL (ref 12.0–15.0)
Immature Granulocytes: 0 %
Lymphocytes Relative: 27 %
Lymphs Abs: 1.8 10*3/uL (ref 0.7–4.0)
MCH: 28.6 pg (ref 26.0–34.0)
MCHC: 33.1 g/dL (ref 30.0–36.0)
MCV: 86.5 fL (ref 80.0–100.0)
Monocytes Absolute: 0.6 10*3/uL (ref 0.1–1.0)
Monocytes Relative: 9 %
Neutro Abs: 4.1 10*3/uL (ref 1.7–7.7)
Neutrophils Relative %: 63 %
Platelets: 64 10*3/uL — ABNORMAL LOW (ref 150–400)
RBC: 4.3 MIL/uL (ref 3.87–5.11)
RDW: 13.1 % (ref 11.5–15.5)
WBC: 6.7 10*3/uL (ref 4.0–10.5)
nRBC: 0 % (ref 0.0–0.2)

## 2021-05-06 LAB — COMPREHENSIVE METABOLIC PANEL
ALT: 35 U/L (ref 0–44)
AST: 26 U/L (ref 15–41)
Albumin: 4.2 g/dL (ref 3.5–5.0)
Alkaline Phosphatase: 68 U/L (ref 38–126)
Anion gap: 7 (ref 5–15)
BUN: 12 mg/dL (ref 6–20)
CO2: 24 mmol/L (ref 22–32)
Calcium: 9.4 mg/dL (ref 8.9–10.3)
Chloride: 104 mmol/L (ref 98–111)
Creatinine, Ser: 0.71 mg/dL (ref 0.44–1.00)
GFR, Estimated: >60 mL/min (ref >60)
Glucose, Bld: 172 mg/dL — ABNORMAL HIGH (ref 70–99)
Potassium: 4.3 mmol/L (ref 3.5–5.1)
Sodium: 135 mmol/L (ref 135–145)
Total Bilirubin: 1 mg/dL (ref 0.3–1.2)
Total Protein: 8.4 g/dL — ABNORMAL HIGH (ref 6.5–8.1)

## 2021-05-06 LAB — URINALYSIS, ROUTINE W REFLEX MICROSCOPIC
Bilirubin Urine: NEGATIVE
Glucose, UA: NEGATIVE mg/dL
Hgb urine dipstick: NEGATIVE
Ketones, ur: NEGATIVE mg/dL
Leukocytes,Ua: NEGATIVE
Nitrite: NEGATIVE
Protein, ur: NEGATIVE mg/dL
Specific Gravity, Urine: 1.01 (ref 1.005–1.030)
pH: 6 (ref 5.0–8.0)

## 2021-05-06 LAB — PREGNANCY, URINE: Preg Test, Ur: NEGATIVE

## 2021-05-06 LAB — CBG MONITORING, ED: Glucose-Capillary: 179 mg/dL — ABNORMAL HIGH (ref 70–99)

## 2021-05-06 NOTE — Discharge Instructions (Addendum)
Call Dr. Asa Lente office today for an appointment in 1 week.  If you develop headache, weakness on one side of your body, fever, vomiting, bleeding, clotting, or any other new/concerning symptoms then return to the ER for evaluation.

## 2021-05-06 NOTE — ED Provider Notes (Signed)
[facility] [place] EMERGENCY DEPARTMENT Provider Note   CSN: [id number] Arrival date & time: 05/06/21  8502     History Chief Complaint  Patient presents with   Fatigue    Kellie Shaffer is a 30 y.o. female.  HPI 30 year old female presents with fatigue.  This is been ongoing for about a month.  At first she thought it is because she was not sleeping well but then she has made a point of sleeping at least 8 hours per night and she still wakes up very tired.  Out the whole day.  She denies headache, fever, cough, trouble breathing or chest pain though she has had a couple brief palpitations over the last day or so.  She has been drinking coffee and it still does not seem to work.  She denies any recent new medicines besides Depo-Provera about 2 months ago.  No recent travel.  Denies any bleeding.  No weight loss/weight gain.  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 Shirline Frees.  She has not seen him in about 6 months or so.  Past Medical History:  Diagnosis Date   Abnormal Pap smear of cervix 2019   LSIL   Abortion May 2013   Anemia    Angio-edema    Anxiety    Diabetes mellitus without complication (HCC)    Dizziness 11/22/2019   Hemoglobin A1c less than 7.0% 03/2020   History of ITP    History of palpitations    Cardiac event monitor June 2019: Normal.  Sinus rhythm no arrhythmias or significant PACs/PVCs.   Hypercholesteremia    Hyperglycemia 03/2020   Hypertension    Hyperthyroidism    Iron deficiency anemia 09/04/2016   Peripheral vertigo    Recurrent upper respiratory infection (URI)    Seasonal allergies 11/22/2019   Urticaria    Vertigo 11/22/2019   Vertigo     Patient Active Problem List   Diagnosis Date Noted   Seasonal allergies 11/23/2019   Hemoglobin A1C between 7% and 9% indicating borderline diabetic  control 09/28/2019   Diarrhea 05/29/2019   Dizziness 05/29/2019   Type 2 diabetes mellitus without complication, without long-term current use of insulin (HCC) 11/09/2018   Hypertension 11/09/2018   Anxiety 11/09/2018   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   CIN II (cervical intraepithelial neoplasia II) 07/28/2018   CIN III (cervical intraepithelial neoplasia grade III) with severe dysplasia 07/28/2018   Palpitations 01/12/2018   Elevated troponin 01/12/2018   DKA (diabetic ketoacidoses) 08/27/2017   Hyperglycemia 08/13/2017   Iron deficiency anemia 09/04/2016   Hyperthyroidism 07/05/2014   Thrombocytopenia (HCC) 05/27/2014   Hypercholesteremia     Past Surgical History:  Procedure Laterality Date   LAPAROSCOPIC APPENDECTOMY N/A 01/14/2017   Procedure: APPENDECTOMY LAPAROSCOPIC;  Surgeon: Claud Kelp, MD;  Location: [facility];  Service: General;  Laterality: N/A;     OB History     Gravida  1   Para      Term      Preterm      AB  1   Living         SAB      IAB  1   Ectopic      Multiple      Live Births  Family History  Problem Relation Age of Onset   Diabetes Other        Parent   Hypertension Other        Parent   Hyperlipidemia Other        Parent   Arthritis Other        Parent   Breast cancer Mother 42    Social History   Tobacco Use   Smoking status: Former    Packs/day: 0.25    Years: 10.00    Pack years: 2.50    Types: Cigarettes    Start date: 2011    Quit date: 2021    Years since quitting: 1.7   Smokeless tobacco: Never  Vaping Use   Vaping Use: Never used  Substance Use Topics   Alcohol use: Yes    Comment: 2 glasses of wine/month   Drug use: No    Home Medications Prior to Admission medications   Medication Sig Start Date End Date Taking? Authorizing Provider  atorvastatin (LIPITOR) 40 MG tablet TAKE 1 TABLET DAILY 10/15/20   [name], FNP  Biotin 01027 MCG TABS Take 1,000 mcg by  mouth daily.     [provider]  busPIRone (BUSPAR) 10 MG tablet TAKE 1 TABLET DAILY 07/26/20   [name], FNP  cetirizine (ZYRTEC) 10 MG tablet Take 1 tablet (10 mg total) by mouth daily as needed for allergies. 08/21/20   Marcelyn Bruins, MD  famotidine (PEPCID) 20 MG tablet Take 20 mg by mouth 2 (two) times daily. 05/27/20   [provider]  ferrous sulfate 325 (65 FE) MG tablet Take 1 tablet (325 mg total) by mouth 2 (two) times daily with a meal. 01/29/21   Barbette Merino, NP  glipiZIDE (GLUCOTROL) 10 MG tablet TAKE 1 TABLET(10 MG) BY MOUTH TWICE DAILY BEFORE A MEAL 10/13/19   [name], FNP  lisinopril (ZESTRIL) 5 MG tablet TAKE 1 TABLET DAILY 10/15/20   [name], FNP  meclizine (ANTIVERT) 25 MG tablet TAKE 1 TABLET(25 MG) BY MOUTH THREE TIMES DAILY AS NEEDED FOR DIZZINESS 06/26/19   [name], FNP  medroxyPROGESTERone (DEPO-PROVERA) 150 MG/ML injection Inject 1 mL (150 mg total) into the muscle once for 1 dose. Every 90 days. 03/25/21 03/25/21  Patton Salles, MD  metFORMIN (GLUCOPHAGE) 500 MG tablet Take 1 tablet (500 mg total) by mouth 2 (two) times daily with a meal. 05/29/19   [name], FNP  methimazole (TAPAZOLE) 10 MG tablet TAKE 1 TABLET DAILY 11/25/20   Romero Belling, MD  metoprolol tartrate (LOPRESSOR) 25 MG tablet TAKE ONE-HALF (1/2) TABLET EVERY MORNING 03/26/21   Barbette Merino, NP  Multiple Vitamins-Minerals (MULTIVITAMIN ADULT PO) Take by mouth daily.    [provider]  norethindrone (MICRONOR) 0.35 MG tablet Take 1 tablet (0.35 mg total) by mouth daily. 01/09/21   Patton Salles, MD  omeprazole (PRILOSEC) 40 MG capsule Take 40 mg by mouth daily. 12/18/19   [provider]  ondansetron (ZOFRAN ODT) 4 MG disintegrating tablet Take 1 tablet (4 mg total) by mouth every 8 (eight) hours as needed for nausea or vomiting. 10/12/20   Gerhard Munch, MD  Probiotic Product (PROBIOTIC-10 PO)  Take by mouth. 1 caples    [provider]    Allergies    Benadryl [diphenhydramine hcl] and Chloroxine  Review of Systems   Review of Systems  Constitutional:  Positive for fatigue. Negative for  fever.  Eyes:  Negative for visual disturbance.  Respiratory:  Negative for cough and shortness of breath.   Cardiovascular:  Negative for chest pain.  Gastrointestinal:  Negative for abdominal pain.  Neurological:  Negative for weakness and headaches.  Hematological:  Does not bruise/bleed easily.  All other systems reviewed and are negative.  Physical Exam Updated Vital Signs BP 127/89   Pulse 70   Temp 98.8 F (37.1 C)   Resp 12   Ht 5\' 9"  (1.753 m)   Wt 102.1 kg   LMP 02/24/2021   SpO2 100%   BMI 33.23 kg/m   Physical Exam Vitals and nursing note reviewed.  Constitutional:      General: She is not in acute distress.    Appearance: She is well-developed. She is not ill-appearing or diaphoretic.  HENT:     Head: Normocephalic and atraumatic.     Right Ear: External ear normal.     Left Ear: External ear normal.     Nose: Nose normal.  Eyes:     General:        Right eye: No discharge.        Left eye: No discharge.     Extraocular Movements: Extraocular movements intact.     Pupils: Pupils are equal, round, and reactive to light.     Comments: No obvious lid lag  Cardiovascular:     Rate and Rhythm: Normal rate and regular rhythm.     Heart sounds: Normal heart sounds. No murmur heard. Pulmonary:     Effort: Pulmonary effort is normal.     Breath sounds: Normal breath sounds.  Abdominal:     Palpations: Abdomen is soft.     Tenderness: There is no abdominal tenderness.  Musculoskeletal:     Cervical back: Neck supple.  Skin:    General: Skin is warm and dry.  Neurological:     Mental Status: She is alert.     Comments: CN 3-12 grossly intact. 5/5 strength in all 4 extremities. Grossly normal sensation. Normal finger to nose.   Psychiatric:         Mood and Affect: Mood is not anxious.    ED Results / Procedures / Treatments   Labs (all labs ordered are listed, but only abnormal results are displayed) Labs Reviewed  CBC WITH DIFFERENTIAL/PLATELET - Abnormal; Notable for the following components:      Result Value   Platelets 64 (*)    All other components within normal limits  COMPREHENSIVE METABOLIC PANEL - Abnormal; Notable for the following components:   Glucose, Bld 172 (*)    Total Protein 8.4 (*)    All other components within normal limits  CBG MONITORING, ED - Abnormal; Notable for the following components:   Glucose-Capillary 179 (*)    All other components within normal limits  PREGNANCY, URINE  URINALYSIS, ROUTINE W REFLEX MICROSCOPIC    EKG EKG Interpretation  Date/Time:  Tuesday May 06 2021 12:34:24 EDT Ventricular Rate:  66 PR Interval:  165 QRS Duration: 97 QT Interval:  412 QTC Calculation: 432 R Axis:   22 Text Interpretation: Sinus rhythm T wave changes improved compared to 2020 Confirmed by 12-17-1978 [phone]) on 05/06/2021 12:44:19 PM  Radiology No results found.  Procedures Procedures   Medications Ordered in ED Medications - No data to display  ED Course  I have reviewed the triage vital signs and the nursing notes.  Pertinent labs & imaging results that were available during my  care of the patient were reviewed by me and considered in my medical decision making (see chart for details).    MDM Rules/Calculators/A&P                           Patient was hypertensive on arrival but otherwise her vitals have been unremarkable.  No fevers.  No focal weakness.  No lid lag, I think myasthenia gravis is less likely.  She does have moderate thrombocytopenia and she has had a remote history of ITP.  I discussed with her oncologist/hematologist, Dr. Arbutus Ped, who advises to hold on any type of treatment but will need to be seen in his office in about a week for repeat labs.  Given return  precautions.  No obvious hepatosplenomegaly. Final Clinical Impression(s) / ED Diagnoses Final diagnoses:  Fatigue, unspecified type  Thrombocytopenia (HCC)    Rx / DC Orders ED Discharge Orders     None        Pricilla Loveless, MD 05/06/21 1257

## 2021-05-06 NOTE — ED Triage Notes (Addendum)
Fatigue for one month, worsening over time.  Pt states she is very tired and feels like she falls asleep during the day.  No headaches.  No respiratory distress.  Pt has attempted to see PCP but the office has not contacted her back.  Pt states she is diabetic but was taken off her meds.  Pt had coffee with sugar this morning.

## 2021-05-07 ENCOUNTER — Telehealth: Payer: Self-pay | Admitting: Endocrinology

## 2021-05-07 NOTE — Telephone Encounter (Signed)
Error

## 2021-05-08 ENCOUNTER — Ambulatory Visit (INDEPENDENT_AMBULATORY_CARE_PROVIDER_SITE_OTHER): Payer: BC Managed Care – PPO | Admitting: Endocrinology

## 2021-05-08 ENCOUNTER — Other Ambulatory Visit: Payer: Self-pay

## 2021-05-08 ENCOUNTER — Other Ambulatory Visit (INDEPENDENT_AMBULATORY_CARE_PROVIDER_SITE_OTHER): Payer: BC Managed Care – PPO

## 2021-05-08 VITALS — BP 120/70 | HR 78 | Ht 69.0 in | Wt 221.6 lb

## 2021-05-08 DIAGNOSIS — E0590 Thyrotoxicosis, unspecified without thyrotoxic crisis or storm: Secondary | ICD-10-CM | POA: Diagnosis not present

## 2021-05-08 LAB — TSH: TSH: 3.96 u[IU]/mL (ref 0.35–5.50)

## 2021-05-08 LAB — T4, FREE: Free T4: 1.47 ng/dL (ref 0.60–1.60)

## 2021-05-08 NOTE — Progress Notes (Signed)
Subjective:    Patient ID: Kellie Shaffer, female    DOB: 12/11/1990, 30 y.o.   MRN: [id number]  HPI Pt returns for f/u of hyperthyroidism (dx'ed 2013, during a pregnancy; US showed a small multinodular goiter, but she also has proptosis; she was then rx'ed with tapazole; she was lost to f/u, but hyperthyroidism was found again during a hospitalization in late 2015 for appendicitis; she was restarted on tapazole then; pt says she is not at risk for another pregnancy; f/u TFT in 2018 were normal off rx; later in 2018, it was resumed due to suppressed TSH).   She says she never misses the methimazole.  pt states she feels well in general.  Specifically, she denies palpitations and tremor.   Past Medical History:  Diagnosis Date   Abnormal Pap smear of cervix 2019   LSIL   Abortion May 2013   Anemia    Angio-edema    Anxiety    Diabetes mellitus without complication (HCC)    Dizziness 11/22/2019   Hemoglobin A1c less than 7.0% 03/2020   History of ITP    History of palpitations    Cardiac event monitor June 2019: Normal.  Sinus rhythm no arrhythmias or significant PACs/PVCs.   Hypercholesteremia    Hyperglycemia 03/2020   Hypertension    Hyperthyroidism    Iron deficiency anemia 09/04/2016   Peripheral vertigo    Recurrent upper respiratory infection (URI)    Seasonal allergies 11/22/2019   Urticaria    Vertigo 11/22/2019   Vertigo     Past Surgical History:  Procedure Laterality Date   LAPAROSCOPIC APPENDECTOMY N/A 01/14/2017   Procedure: APPENDECTOMY LAPAROSCOPIC;  Surgeon: Claud Kelp, MD;  Location: [facility];  Service: General;  Laterality: N/A;    Social History   Socioeconomic History   Marital status: Single    Spouse name: Not on file   Number of children: Not on file   Years of education: 14   Highest education level: Not on file  Occupational History   Occupation: [employer]  Tobacco Use   Smoking status: Former    Packs/day: 0.25    Years: 10.00     Pack years: 2.50    Types: Cigarettes    Start date: 2011    Quit date: 2021    Years since quitting: 1.7   Smokeless tobacco: Never  Vaping Use   Vaping Use: Never used  Substance and Sexual Activity   Alcohol use: Yes    Comment: 2 glasses of wine/month   Drug use: No   Sexual activity: Yes    Partners: Male    Birth control/protection: Condom, Pill    Comment: Micronor  Other Topics Concern   Not on file  Social History Narrative   Regular exercise-yes   Caffeine Use-yes         Social Determinants of Health   Financial Resource Strain: Not on file  Food Insecurity: Not on file  Transportation Needs: Not on file  Physical Activity: Not on file  Stress: Not on file  Social Connections: Not on file  Intimate Partner Violence: Not on file    Current Outpatient Medications on File Prior to Visit  Medication Sig Dispense Refill   atorvastatin (LIPITOR) 40 MG tablet TAKE 1 TABLET DAILY 90 tablet 3   Biotin 37106 MCG TABS Take 1,000 mcg by mouth daily.      busPIRone (BUSPAR) 10 MG tablet TAKE 1 TABLET DAILY 90 tablet 3   cetirizine (ZYRTEC)  10 MG tablet Take 1 tablet (10 mg total) by mouth daily as needed for allergies. 90 tablet 1   famotidine (PEPCID) 20 MG tablet Take 20 mg by mouth 2 (two) times daily.     ferrous sulfate 325 (65 FE) MG tablet Take 1 tablet (325 mg total) by mouth 2 (two) times daily with a meal. 60 tablet 6   glipiZIDE (GLUCOTROL) 10 MG tablet TAKE 1 TABLET(10 MG) BY MOUTH TWICE DAILY BEFORE A MEAL 60 tablet 3   lisinopril (ZESTRIL) 5 MG tablet TAKE 1 TABLET DAILY 90 tablet 3   meclizine (ANTIVERT) 25 MG tablet TAKE 1 TABLET(25 MG) BY MOUTH THREE TIMES DAILY AS NEEDED FOR DIZZINESS 30 tablet 3   metFORMIN (GLUCOPHAGE) 500 MG tablet Take 1 tablet (500 mg total) by mouth 2 (two) times daily with a meal. 180 tablet 3   methimazole (TAPAZOLE) 10 MG tablet TAKE 1 TABLET DAILY 90 tablet 3   metoprolol tartrate (LOPRESSOR) 25 MG tablet TAKE ONE-HALF  (1/2) TABLET EVERY MORNING 90 tablet 3   Multiple Vitamins-Minerals (MULTIVITAMIN ADULT PO) Take by mouth daily.     norethindrone (MICRONOR) 0.35 MG tablet Take 1 tablet (0.35 mg total) by mouth daily. 84 tablet 3   omeprazole (PRILOSEC) 40 MG capsule Take 40 mg by mouth daily.     ondansetron (ZOFRAN ODT) 4 MG disintegrating tablet Take 1 tablet (4 mg total) by mouth every 8 (eight) hours as needed for nausea or vomiting. 20 tablet 0   Probiotic Product (PROBIOTIC-10 PO) Take by mouth. 1 caples     medroxyPROGESTERone (DEPO-PROVERA) 150 MG/ML injection Inject 1 mL (150 mg total) into the muscle once for 1 dose. Every 90 days. 1 mL 02   No current facility-administered medications on file prior to visit.    Allergies  Allergen Reactions   Benadryl [Diphenhydramine Hcl] Anaphylaxis and Hives   Chloroxine Hives    Clorox    Family History  Problem Relation Age of Onset   Diabetes Other        Parent   Hypertension Other        Parent   Hyperlipidemia Other        Parent   Arthritis Other        Parent   Breast cancer Mother 45    BP 120/70 (BP Location: Right Arm, Patient Position: Sitting, Cuff Size: Large)   Pulse 78   Ht 5\' 9"  (1.753 m)   Wt 221 lb 9.6 oz (100.5 kg)   SpO2 97%   BMI 32.72 kg/m    Review of Systems Denies fever.      Objective:   Physical Exam VITAL SIGNS:  See vs page.   GENERAL: no distress.   EYES: bilat proptosis.   NECK: thyroid is slightly and diffusely enlarged.     Lab Results  Component Value Date   TSH 3.96 05/08/2021      Assessment & Plan:  Hypothyroidism: well-controlled.  Please continue the same synthroid.

## 2021-05-08 NOTE — Patient Instructions (Addendum)
Blood tests are requested for you today.  We'll let you know about the results.   °If ever you have fever while taking methimazole, stop it and call us, even if the reason is obvious, because of the risk of a rare side-effect.   °It is best to never miss the methimazole.  However, if you do miss it, next best is to double up the next time.   °Please come back for a follow-up appointment in 6 months.   °

## 2021-05-13 ENCOUNTER — Other Ambulatory Visit: Payer: Self-pay | Admitting: Internal Medicine

## 2021-05-13 DIAGNOSIS — D696 Thrombocytopenia, unspecified: Secondary | ICD-10-CM

## 2021-05-15 ENCOUNTER — Inpatient Hospital Stay (HOSPITAL_BASED_OUTPATIENT_CLINIC_OR_DEPARTMENT_OTHER): Payer: BC Managed Care – PPO | Admitting: Internal Medicine

## 2021-05-15 ENCOUNTER — Inpatient Hospital Stay: Payer: BC Managed Care – PPO | Attending: Internal Medicine

## 2021-05-15 ENCOUNTER — Other Ambulatory Visit: Payer: Self-pay

## 2021-05-15 VITALS — BP 149/90 | HR 78 | Temp 98.7°F | Resp 20 | Ht 69.0 in | Wt 225.6 lb

## 2021-05-15 DIAGNOSIS — D696 Thrombocytopenia, unspecified: Secondary | ICD-10-CM

## 2021-05-15 DIAGNOSIS — D693 Immune thrombocytopenic purpura: Secondary | ICD-10-CM | POA: Insufficient documentation

## 2021-05-15 LAB — CBC WITH DIFFERENTIAL (CANCER CENTER ONLY)
Abs Immature Granulocytes: 0.04 10*3/uL (ref 0.00–0.07)
Basophils Absolute: 0 10*3/uL (ref 0.0–0.1)
Basophils Relative: 0 %
Eosinophils Absolute: 0.1 10*3/uL (ref 0.0–0.5)
Eosinophils Relative: 1 %
HCT: 34.2 % — ABNORMAL LOW (ref 36.0–46.0)
Hemoglobin: 11.5 g/dL — ABNORMAL LOW (ref 12.0–15.0)
Immature Granulocytes: 1 %
Lymphocytes Relative: 23 %
Lymphs Abs: 1.6 10*3/uL (ref 0.7–4.0)
MCH: 28.8 pg (ref 26.0–34.0)
MCHC: 33.6 g/dL (ref 30.0–36.0)
MCV: 85.5 fL (ref 80.0–100.0)
Monocytes Absolute: 0.7 10*3/uL (ref 0.1–1.0)
Monocytes Relative: 10 %
Neutro Abs: 4.6 10*3/uL (ref 1.7–7.7)
Neutrophils Relative %: 65 %
Platelet Count: 50 10*3/uL — ABNORMAL LOW (ref 150–400)
RBC: 4 MIL/uL (ref 3.87–5.11)
RDW: 12.7 % (ref 11.5–15.5)
WBC Count: 7.1 10*3/uL (ref 4.0–10.5)
nRBC: 0 % (ref 0.0–0.2)

## 2021-05-15 LAB — CMP (CANCER CENTER ONLY)
ALT: 32 U/L (ref 0–44)
AST: 14 U/L — ABNORMAL LOW (ref 15–41)
Albumin: 4 g/dL (ref 3.5–5.0)
Alkaline Phosphatase: 76 U/L (ref 38–126)
Anion gap: 10 (ref 5–15)
BUN: 13 mg/dL (ref 6–20)
CO2: 23 mmol/L (ref 22–32)
Calcium: 9.5 mg/dL (ref 8.9–10.3)
Chloride: 105 mmol/L (ref 98–111)
Creatinine: 0.84 mg/dL (ref 0.44–1.00)
GFR, Estimated: >60 mL/min (ref >60)
Glucose, Bld: 189 mg/dL — ABNORMAL HIGH (ref 70–99)
Potassium: 3.8 mmol/L (ref 3.5–5.1)
Sodium: 138 mmol/L (ref 135–145)
Total Bilirubin: 1 mg/dL (ref 0.3–1.2)
Total Protein: 7.8 g/dL (ref 6.5–8.1)

## 2021-05-15 LAB — LACTATE DEHYDROGENASE: LDH: 167 U/L (ref 98–192)

## 2021-05-15 MED ORDER — PREDNISONE 20 MG PO TABS: ORAL_TABLET | ORAL | 0 refills | Status: DC

## 2021-05-15 MED ORDER — OMEPRAZOLE 40 MG PO CPDR: 40.0000 mg | DELAYED_RELEASE_CAPSULE | Freq: Every day | ORAL | 1 refills | Status: DC

## 2021-05-15 NOTE — Progress Notes (Signed)
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  CURRENT THERAPY: Observation.  INTERVAL HISTORY: Kellie Shaffer 30 y.o. female returns to the clinic today for follow-up visit.  The patient was lost to follow-up for several months.  She started complaining of increasing fatigue and weakness and she presented to the emergency department for evaluation.  She was noted to have significant drop in her hemoglobin hematocrit as well as the platelets count.  She had a follow-up appointment with me today for evaluation of her condition.  She denied having any chest pain, shortness of breath, cough or hemoptysis.  She denied having any nausea, vomiting, diarrhea or constipation.  She has no headache or visual changes.  She has no bleeding, bruises or ecchymosis.  The patient is here today with repeat CBC and comprehensive metabolic panel.   MEDICAL HISTORY: Past Medical History:  Diagnosis Date   Abnormal Pap smear of cervix 2019   LSIL   Abortion May 2013   Anemia    Angio-edema    Anxiety    Diabetes mellitus without complication (HCC)    Dizziness 11/22/2019   Hemoglobin A1c less than 7.0% 03/2020   History of ITP    History of palpitations    Cardiac event monitor June 2019: Normal.  Sinus rhythm no arrhythmias or significant PACs/PVCs.   Hypercholesteremia    Hyperglycemia 03/2020   Hypertension    Hyperthyroidism    Iron deficiency anemia 09/04/2016   Peripheral vertigo    Recurrent upper respiratory infection (URI)    Seasonal allergies 11/22/2019   Urticaria    Vertigo 11/22/2019   Vertigo     ALLERGIES:  is allergic to benadryl [diphenhydramine hcl] and chloroxine.  MEDICATIONS:  Current Outpatient  Medications  Medication Sig Dispense Refill   atorvastatin (LIPITOR) 40 MG tablet TAKE 1 TABLET DAILY 90 tablet 3   Biotin 62952 MCG TABS Take 1,000 mcg by mouth daily.      busPIRone (BUSPAR) 10 MG tablet TAKE 1 TABLET DAILY 90 tablet 3   cetirizine (ZYRTEC) 10 MG tablet Take 1 tablet (10 mg total) by mouth daily as needed for allergies. 90 tablet 1   famotidine (PEPCID) 20 MG tablet Take 20 mg by mouth 2 (two) times daily.     ferrous sulfate 325 (65 FE) MG tablet Take 1 tablet (325 mg total) by mouth 2 (two) times daily with a meal. 60 tablet 6   glipiZIDE (GLUCOTROL) 10 MG tablet TAKE 1 TABLET(10 MG) BY MOUTH TWICE DAILY BEFORE A MEAL 60 tablet 3   lisinopril (ZESTRIL) 5 MG tablet TAKE 1 TABLET DAILY 90 tablet 3   meclizine (ANTIVERT) 25 MG tablet TAKE 1 TABLET(25 MG) BY MOUTH THREE TIMES DAILY AS NEEDED FOR DIZZINESS 30 tablet 3   medroxyPROGESTERone (DEPO-PROVERA) 150 MG/ML injection Inject 1 mL (150 mg total) into the muscle once for 1 dose. Every 90 days. 1 mL 02   metFORMIN (GLUCOPHAGE) 500 MG tablet Take 1 tablet (500 mg total) by mouth 2 (two) times daily with a meal. 180 tablet 3   methimazole (TAPAZOLE) 10 MG tablet TAKE 1 TABLET DAILY 90 tablet 3   metoprolol tartrate (LOPRESSOR) 25 MG tablet TAKE ONE-HALF (1/2) TABLET EVERY MORNING 90  tablet 3   Multiple Vitamins-Minerals (MULTIVITAMIN ADULT PO) Take by mouth daily.     norethindrone (MICRONOR) 0.35 MG tablet Take 1 tablet (0.35 mg total) by mouth daily. 84 tablet 3   omeprazole (PRILOSEC) 40 MG capsule Take 40 mg by mouth daily.     ondansetron (ZOFRAN ODT) 4 MG disintegrating tablet Take 1 tablet (4 mg total) by mouth every 8 (eight) hours as needed for nausea or vomiting. 20 tablet 0   Probiotic Product (PROBIOTIC-10 PO) Take by mouth. 1 caples     No current facility-administered medications for this visit.    SURGICAL HISTORY:  Past Surgical History:  Procedure Laterality Date   LAPAROSCOPIC APPENDECTOMY N/A 01/14/2017    Procedure: APPENDECTOMY LAPAROSCOPIC;  Surgeon: Claud Kelp, MD;  Location: [facility];  Service: General;  Laterality: N/A;    REVIEW OF SYSTEMS:  A comprehensive review of systems was negative except for: Constitutional: positive for fatigue   PHYSICAL EXAMINATION: General appearance: alert, cooperative, appears stated age, fatigued, and no distress Head: Normocephalic, without obvious abnormality, atraumatic Neck: no adenopathy, no JVD, supple, symmetrical, trachea midline, and thyroid not enlarged, symmetric, no tenderness/mass/nodules Lymph nodes: Cervical, supraclavicular, and axillary nodes normal. Resp: clear to auscultation bilaterally Back: symmetric, no curvature. ROM normal. No CVA tenderness. Cardio: regular rate and rhythm, S1, S2 normal, no murmur, click, rub or gallop GI: soft, non-tender; bowel sounds normal; no masses,  no organomegaly Extremities: extremities normal, atraumatic, no cyanosis or edema  ECOG PERFORMANCE STATUS: 0 - Asymptomatic  Blood pressure (!) 149/90, pulse 78, temperature 98.7 F (37.1 C), temperature source Oral, resp. rate 20, height 5\' 9"  (1.753 m), weight 225 lb 9.6 oz (102.3 kg), SpO2 100 %.  LABORATORY DATA: Lab Results  Component Value Date   WBC 7.1 05/15/2021   HGB 11.5 (L) 05/15/2021   HCT 34.2 (L) 05/15/2021   MCV 85.5 05/15/2021   PLT 50 (L) 05/15/2021      Chemistry      Component Value Date/Time   NA 135 05/06/2021 1020   NA 140 10/02/2020 1132   NA 132 (L) 08/13/2017 1044   K 4.3 05/06/2021 1020   K 4.3 08/13/2017 1044   CL 104 05/06/2021 1020   CO2 24 05/06/2021 1020   CO2 25 08/13/2017 1044   BUN 12 05/06/2021 1020   BUN 10 10/02/2020 1132   BUN 18.6 08/13/2017 1044   CREATININE 0.71 05/06/2021 1020   CREATININE 0.77 12/20/2019 1020   CREATININE 1.3 (H) 08/13/2017 1044   GLU 517 (H) 08/13/2017 1257      Component Value Date/Time   CALCIUM 9.4 05/06/2021 1020   CALCIUM 9.9 08/13/2017 1044   ALKPHOS 68  05/06/2021 1020   ALKPHOS 100 08/13/2017 1044   AST 26 05/06/2021 1020   AST 20 12/20/2019 1020   AST 5 08/13/2017 1044   ALT 35 05/06/2021 1020   ALT 45 (H) 12/20/2019 1020   ALT 15 08/13/2017 1044   BILITOT 1.0 05/06/2021 1020   BILITOT 0.8 10/02/2020 1132   BILITOT 0.7 12/20/2019 1020   BILITOT 0.52 08/13/2017 1044       RADIOGRAPHIC STUDIES: No results found.  ASSESSMENT AND PLAN: This is a very pleasant 30 years old African-American female with: 1) idiopathic thrombocytopenic purpura: She was treated a few months ago with a taper dose of prednisone because of the significant thrombocytopenia.  The patient was treated in the past with steroids and has been doing fine. She has been off treatment for  several months and she was also lost to follow-up. Repeat CBC today showed significant decline in her hemoglobin and hematocrit as well as platelet count. I will arrange for the patient to start a tapered dose of prednisone over the next several weeks.  We will start with 100 mg p.o. daily for 2 weeks followed by 80 mg p.o. daily for 1 week followed by 60 mg p.o. daily for 1 week followed by 40 mg p.o. daily for 1 week followed by 20 mg p.o. daily for 1 week and then 10 mg p.o. daily for 1 week. The patient will come back for follow-up visit in 2 weeks for evaluation and repeat blood work. She was advised to call immediately if she has any other concerning symptoms in the interval. The patient voices understanding of current disease status and treatment options and is in agreement with the current care plan. All questions were answered. The patient knows to call the clinic with any problems, questions or concerns. We can certainly see the patient much sooner if necessary.  Disclaimer: This note was dictated with voice recognition software. Similar sounding words can inadvertently be transcribed and may not be corrected upon review.

## 2021-05-16 ENCOUNTER — Telehealth: Payer: Self-pay | Admitting: Internal Medicine

## 2021-05-16 NOTE — Telephone Encounter (Signed)
Scheduled appt per 9/29 los - patient is aware of appt date and time

## 2021-05-22 ENCOUNTER — Ambulatory Visit (INDEPENDENT_AMBULATORY_CARE_PROVIDER_SITE_OTHER): Payer: BC Managed Care – PPO | Admitting: Nurse Practitioner

## 2021-05-22 ENCOUNTER — Encounter: Payer: Self-pay | Admitting: Nurse Practitioner

## 2021-05-22 ENCOUNTER — Other Ambulatory Visit: Payer: Self-pay

## 2021-05-22 VITALS — BP 135/74 | HR 75 | Temp 97.3°F | Ht 69.0 in | Wt 227.2 lb

## 2021-05-22 DIAGNOSIS — E119 Type 2 diabetes mellitus without complications: Secondary | ICD-10-CM | POA: Diagnosis not present

## 2021-05-22 DIAGNOSIS — F419 Anxiety disorder, unspecified: Secondary | ICD-10-CM

## 2021-05-22 DIAGNOSIS — R7309 Other abnormal glucose: Secondary | ICD-10-CM | POA: Diagnosis not present

## 2021-05-22 LAB — POCT URINALYSIS DIP (CLINITEK)
Bilirubin, UA: NEGATIVE
Blood, UA: NEGATIVE
Glucose, UA: NEGATIVE mg/dL
Ketones, POC UA: NEGATIVE mg/dL
Leukocytes, UA: NEGATIVE
Nitrite, UA: NEGATIVE
POC PROTEIN,UA: NEGATIVE
Spec Grav, UA: 1.02 (ref 1.010–1.025)
Urobilinogen, UA: 0.2 E.U./dL
pH, UA: 5.5 (ref 5.0–8.0)

## 2021-05-22 LAB — POCT GLYCOSYLATED HEMOGLOBIN (HGB A1C)
HbA1c POC (<> result, manual entry): 7.1 % (ref 4.0–5.6)
HbA1c, POC (controlled diabetic range): 7.1 % — AB (ref 0.0–7.0)
HbA1c, POC (prediabetic range): 7.1 % — AB (ref 5.7–6.4)
Hemoglobin A1C: 7.1 % — AB (ref 4.0–5.6)

## 2021-05-22 MED ORDER — GLIPIZIDE 10 MG PO TABS: 10.0000 mg | ORAL_TABLET | Freq: Two times a day (BID) | ORAL | 3 refills | Status: DC

## 2021-05-22 MED ORDER — LISINOPRIL 5 MG PO TABS: 5.0000 mg | ORAL_TABLET | Freq: Every day | ORAL | 3 refills | Status: DC

## 2021-05-22 MED ORDER — ATORVASTATIN CALCIUM 40 MG PO TABS: 40.0000 mg | ORAL_TABLET | Freq: Every day | ORAL | 3 refills | Status: DC

## 2021-05-22 MED ORDER — BUSPIRONE HCL 10 MG PO TABS: 10.0000 mg | ORAL_TABLET | Freq: Every day | ORAL | 3 refills | Status: DC

## 2021-05-22 MED ORDER — METFORMIN HCL 500 MG PO TABS: 500.0000 mg | ORAL_TABLET | Freq: Two times a day (BID) | ORAL | 3 refills | Status: DC

## 2021-05-22 NOTE — Progress Notes (Signed)
[address] [place], [place]  [address] Phone:  [phone]   Fax:  [phone] He had  Established Patient Office Visit  Subjective:  Patient ID: Kellie Shaffer, female    DOB: 1991-02-27  Age: 30 y.o. MRN: [id number]  CC:  Chief Complaint  Patient presents with   Hospitalization Follow-up    Pt is here today for her hospital follow up. Pt states that she was in the hospital for being very fatigue. Pt states she was told that she was very tired because of her hemoglobin level being very low. Pt was told to be seen by her PCP to talk about getting a sleep study done.    HPI Kellie Shaffer presents for follow up.  She  has a past medical history of Abnormal Pap smear of cervix (2019), Abortion (May 2013), Anemia, Angio-edema, Anxiety, Diabetes mellitus without complication ([place]), Dizziness (11/22/2019), Hemoglobin A1c less than 7.0% (03/2020), History of ITP, History of palpitations, Hypercholesteremia, Hyperglycemia (03/2020), Hypertension, Hyperthyroidism, Iron deficiency anemia (09/04/2016), Peripheral vertigo, Recurrent upper respiratory infection (URI), Seasonal allergies (11/22/2019), Urticaria, Vertigo (11/22/2019), and Vertigo.   She was seen in the ED for fatigue. This was thought to be related to her anemia. She has followed up with hematology. No change in treatment; suggested a sleep study. She feels like her sleep has improved. She will think about the need for a sleep study. She reports falling a sleep at the wheel while on one occasion while having a video call.   She is changing jobs due to increased stress with company policy changes.   She does follow endocrinology for her hyperthyroidism and diabetes.   Past Medical History:  Diagnosis Date   Abnormal Pap smear of cervix 2019   LSIL   Abortion May 2013   Anemia    Angio-edema    Anxiety    Diabetes mellitus without complication ([place])    Dizziness 11/22/2019    Hemoglobin A1c less than 7.0% 03/2020   History of ITP    History of palpitations    Cardiac event monitor June 2019: Normal.  Sinus rhythm no arrhythmias or significant PACs/PVCs.   Hypercholesteremia    Hyperglycemia 03/2020   Hypertension    Hyperthyroidism    Iron deficiency anemia 09/04/2016   Peripheral vertigo    Recurrent upper respiratory infection (URI)    Seasonal allergies 11/22/2019   Urticaria    Vertigo 11/22/2019   Vertigo     Past Surgical History:  Procedure Laterality Date   LAPAROSCOPIC APPENDECTOMY N/A 01/14/2017   Procedure: APPENDECTOMY LAPAROSCOPIC;  Surgeon: Fanny Skates, MD;  Location: [facility];  Service: General;  Laterality: N/A;    Family History  Problem Relation Age of Onset   Diabetes Other        Parent   Hypertension Other        Parent   Hyperlipidemia Other        Parent   Arthritis Other        Parent   Breast cancer Mother 28    Social History   Socioeconomic History   Marital status: Single    Spouse name: Not on file   Number of children: Not on file   Years of education: 14   Highest education level: Not on file  Occupational History   Occupation: [employer]  Tobacco Use   Smoking status: Former    Packs/day: 0.25    Years: 10.00  Pack years: 2.50    Types: Cigarettes    Start date: 2011    Quit date: 2021    Years since quitting: 1.7   Smokeless tobacco: Never  Vaping Use   Vaping Use: Never used  Substance and Sexual Activity   Alcohol use: Yes    Comment: 2 glasses of wine/month   Drug use: No   Sexual activity: Yes    Partners: Male    Birth control/protection: Condom, Injection    Comment: Micronor  Other Topics Concern   Not on file  Social History Narrative   Regular exercise-yes   Caffeine Use-yes         Social Determinants of Health   Financial Resource Strain: Not on file  Food Insecurity: Not on file  Transportation Needs: Not on file  Physical Activity: Not on file  Stress: Not  on file  Social Connections: Not on file  Intimate Partner Violence: Not on file    Outpatient Medications Prior to Visit  Medication Sig Dispense Refill   Biotin 10000 MCG TABS Take 1,000 mcg by mouth daily.      cetirizine (ZYRTEC) 10 MG tablet Take 1 tablet (10 mg total) by mouth daily as needed for allergies. 90 tablet 1   famotidine (PEPCID) 20 MG tablet Take 20 mg by mouth 2 (two) times daily.     ferrous sulfate 325 (65 FE) MG tablet Take 1 tablet (325 mg total) by mouth 2 (two) times daily with a meal. 60 tablet 6   meclizine (ANTIVERT) 25 MG tablet TAKE 1 TABLET(25 MG) BY MOUTH THREE TIMES DAILY AS NEEDED FOR DIZZINESS 30 tablet 3   methimazole (TAPAZOLE) 10 MG tablet TAKE 1 TABLET DAILY 90 tablet 3   metoprolol tartrate (LOPRESSOR) 25 MG tablet TAKE ONE-HALF (1/2) TABLET EVERY MORNING 90 tablet 3   Multiple Vitamins-Minerals (MULTIVITAMIN ADULT PO) Take by mouth daily.     norethindrone (MICRONOR) 0.35 MG tablet Take 1 tablet (0.35 mg total) by mouth daily. 84 tablet 3   omeprazole (PRILOSEC) 40 MG capsule Take 1 capsule (40 mg total) by mouth daily. 30 capsule 1   ondansetron (ZOFRAN ODT) 4 MG disintegrating tablet Take 1 tablet (4 mg total) by mouth every 8 (eight) hours as needed for nausea or vomiting. 20 tablet 0   predniSONE (DELTASONE) 20 MG tablet 5 tablet p.o. daily for 2 weeks followed by 4 tablet p.o. daily for 1 week followed by 3 tablets p.o. daily for 1 week followed by 2 tablet p.o. daily for 1 week followed by 1 tablet p.o. daily for 1 week then half a tablet p.o. daily for 1 week. 144 tablet 0   Probiotic Product (PROBIOTIC-10 PO) Take by mouth. 1 caples     atorvastatin (LIPITOR) 40 MG tablet TAKE 1 TABLET DAILY 90 tablet 3   busPIRone (BUSPAR) 10 MG tablet TAKE 1 TABLET DAILY 90 tablet 3   glipiZIDE (GLUCOTROL) 10 MG tablet TAKE 1 TABLET(10 MG) BY MOUTH TWICE DAILY BEFORE A MEAL 60 tablet 3   lisinopril (ZESTRIL) 5 MG tablet TAKE 1 TABLET DAILY 90 tablet 3    metFORMIN (GLUCOPHAGE) 500 MG tablet Take 1 tablet (500 mg total) by mouth 2 (two) times daily with a meal. 180 tablet 3   medroxyPROGESTERone (DEPO-PROVERA) 150 MG/ML injection Inject 1 mL (150 mg total) into the muscle once for 1 dose. Every 90 days. 1 mL 02   No facility-administered medications prior to visit.    Allergies  Allergen Reactions  Benadryl [Diphenhydramine Hcl] Anaphylaxis and Hives   Chloroxine Hives    Clorox    ROS Review of Systems    Objective:    Physical Exam Constitutional:      Appearance: She is obese.  HENT:     Head: Normocephalic and atraumatic.     Nose: Nose normal.     Mouth/Throat:     Mouth: Mucous membranes are moist.  Cardiovascular:     Rate and Rhythm: Normal rate and regular rhythm.     Pulses: Normal pulses.     Heart sounds: Normal heart sounds.  Pulmonary:     Effort: Pulmonary effort is normal.     Breath sounds: Normal breath sounds.  Abdominal:     General: Bowel sounds are normal.     Palpations: Abdomen is soft.  Musculoskeletal:        General: Normal range of motion.     Cervical back: Normal range of motion.  Skin:    General: Skin is warm and dry.     Capillary Refill: Capillary refill takes less than 2 seconds.  Neurological:     General: No focal deficit present.     Mental Status: She is alert and oriented to person, place, and time.  Psychiatric:        Mood and Affect: Mood normal.        Behavior: Behavior normal.        Thought Content: Thought content normal.    BP 135/74   Pulse 75   Temp (!) 97.3 F (36.3 C)   Ht _0  (1.753 m)   Wt 227 lb 3.2 oz (103.1 kg)   SpO2 99%   BMI 33.55 kg/m  Wt Readings from Last 3 Encounters:  05/22/21 227 lb 3.2 oz (103.1 kg)  05/15/21 225 lb 9.6 oz (102.3 kg)  05/08/21 221 lb 9.6 oz (100.5 kg)     Health Maintenance Due  Topic Date Due   Hepatitis C Screening  Never done   TETANUS/TDAP  08/17/2020   OPHTHALMOLOGY EXAM  09/05/2020    There are no  preventive care reminders to display for this patient.  Lab Results  Component Value Date   TSH 3.96 05/08/2021   Lab Results  Component Value Date   WBC 7.1 05/15/2021   HGB 11.5 (L) 05/15/2021   HCT 34.2 (L) 05/15/2021   MCV 85.5 05/15/2021   PLT 50 (L) 05/15/2021   Lab Results  Component Value Date   NA 138 05/15/2021   K 3.8 05/15/2021   CHLORIDE 95 (L) 08/13/2017   CO2 23 05/15/2021   GLUCOSE 189 (H) 05/15/2021   BUN 13 05/15/2021   CREATININE 0.84 05/15/2021   BILITOT 1.0 05/15/2021   ALKPHOS 76 05/15/2021   AST 14 (L) 05/15/2021   ALT 32 05/15/2021   PROT 7.8 05/15/2021   ALBUMIN 4.0 05/15/2021   CALCIUM 9.5 05/15/2021   ANIONGAP 10 05/15/2021   EGFR >60 08/13/2017   GFR 95.33 09/20/2019   Lab Results  Component Value Date   CHOL 99 (L) 10/02/2020   Lab Results  Component Value Date   HDL 31 (L) 10/02/2020   Lab Results  Component Value Date   LDLCALC 52 10/02/2020   Lab Results  Component Value Date   TRIG 79 10/02/2020   Lab Results  Component Value Date   CHOLHDL 3.2 10/02/2020   Lab Results  Component Value Date   HGBA1C 7.1 (A) 05/22/2021   HGBA1C 7.1 05/22/2021  HGBA1C 7.1 (A) 05/22/2021   HGBA1C 7.1 (A) 05/22/2021      Assessment & Plan:   Problem List Items Addressed This Visit       Endocrine   Type 2 diabetes mellitus without complication, without long-term current use of insulin ([place]) - Primary Stable continue to follow up with endocrinology  Encourage compliance with current treatment regimen  Lifestyle modification with healthy diet (fewer calories, more high fiber foods, whole grains and non-starchy vegetables, lower fat meat and fish, low-fat diary include healthy oils) regular exercise (physical activity) and weight loss     Relevant Medications   atorvastatin (LIPITOR) 40 MG tablet   glipiZIDE (GLUCOTROL) 10 MG tablet   lisinopril (ZESTRIL) 5 MG tablet   metFORMIN (GLUCOPHAGE) 500 MG tablet   Other Relevant  Orders   POCT URINALYSIS DIP (CLINITEK) (Completed)   HgB A1c (Completed)     Other   Anxiety Persistent  Continue with current regimen.   Discussed counseling (with a psychiatrist, psychologist, or social worker) Discussed exercise in particular may have an especially positive effect on depression and anxiety. three to five exercise sessions per week, that last 45 to 60 minutes per session, for at least 10 weeks, and that involve aerobic exercise (such as walking, running, or cycling) or resistance training (upper and lower body weight lifting). She is returning to the gym    Relevant Medications   busPIRone (BUSPAR) 10 MG tablet   Other Visit Diagnoses     Elevated glucose       Relevant Medications   glipiZIDE (GLUCOTROL) 10 MG tablet       Meds ordered this encounter  Medications   atorvastatin (LIPITOR) 40 MG tablet    Sig: Take 1 tablet (40 mg total) by mouth daily.    Dispense:  90 tablet    Refill:  3    Order Specific Question:   Supervising Provider    Answer:   Tresa Garter [[id number]]   busPIRone (BUSPAR) 10 MG tablet    Sig: Take 1 tablet (10 mg total) by mouth daily.    Dispense:  90 tablet    Refill:  3    Order Specific Question:   Supervising Provider    Answer:   Tresa Garter [[id number]]   glipiZIDE (GLUCOTROL) 10 MG tablet    Sig: Take 1 tablet (10 mg total) by mouth 2 (two) times daily before a meal.    Dispense:  180 tablet    Refill:  3    Order Specific Question:   Supervising Provider    Answer:   Tresa Garter [[id number]]   lisinopril (ZESTRIL) 5 MG tablet    Sig: Take 1 tablet (5 mg total) by mouth daily.    Dispense:  90 tablet    Refill:  3    Order Specific Question:   Supervising Provider    Answer:   Tresa Garter [[id number]]   metFORMIN (GLUCOPHAGE) 500 MG tablet    Sig: Take 1 tablet (500 mg total) by mouth 2 (two) times daily with a meal.    Dispense:  180 tablet    Refill:  3    Order Specific Question:    Supervising Provider    AnswerTresa Garter [[id number]]     Follow-up: Return in about 6 months (around 11/20/2021).    Vevelyn Francois, NP

## 2021-05-22 NOTE — Patient Instructions (Addendum)
Sleep Study, Adult A sleep study (polysomnogram) is a series of tests done while you are sleeping. A sleep study records your brain waves, heart rate, breathing rate, oxygen level, and eye and leg movements. A sleep study helps your health care provider: See how well you sleep. Diagnose a sleep disorder. Determine how severe your sleep disorder is. Create a plan to treat your sleep disorder. Your health care provider may recommend a sleep study if you: Feel sleepy on most days. Snore loudly while sleeping. Have unusual behaviors while you sleep, such as walking. Have brief periods in which you stop breathing during sleep (sleepapnea). Fall asleep suddenly during the day (narcolepsy). Have trouble falling asleep or staying asleep (insomnia). Feel like you need to move your legs when trying to fall asleep (restless legs syndrome). Move your legs by flexing and extending them regularly while asleep (periodic limb movement disorder). Act out your dreams while you sleep (sleep behavior disorder). Feel like you cannot move when you first wake up (sleep paralysis). What tests are part of a sleep study? Most sleep studies record the following during sleep: Brain activity. Eye movements. Heart rate and rhythm. Breathing rate and rhythm. Blood-oxygen level. Blood pressure. Chest and belly movement as you breathe. Arm and leg movements. Snoring or other noises. Body position. Where are sleep studies done? Sleep studies are done at sleep centers. A sleep center may be inside a hospital, office, or clinic. The room where you have the study may look like a hospital room or a hotel room. The health care providers doing the study may come in and out of the room during the study. Most of the time, they will be in another room monitoring your test as you sleep. How are sleep studies done? Most sleep studies are done during a normal period of time for a full night of sleep. You will arrive at the  study center in the evening and go home in the morning. Before the test Bring your pajamas and toothbrush with you to the sleep study. Do not have caffeine on the day of your sleep study. Do not drink alcohol on the day of your sleep study. Your health care provider will let you know if you should stop taking any of your regular medicines before the test. During the test   Round, sticky patches with sensors attached to recording wires (electrodes) are placed on your scalp, face, chest, and limbs. Wires from all the electrodes and sensors run from your bed to a computer. The wires can be taken off and put back on if you need to get out of bed to go to the bathroom. A sensor is placed over your nose to measure airflow. A finger clip is put on your finger or ear to measure your blood oxygen level (pulse oximetry). A belt is placed around your belly and a belt is placed around your chest to measure breathing movements. If you have signs of the sleep disorder called sleep apnea during your test, you may get a treatment mask to wear for the second half of the night. The mask provides positive airway pressure (PAP) to help you breathe better during sleep. This may greatly improve your sleep apnea. You will then have all tests done again with the mask in place to see if your measurements and recordings change. After the test A medical doctor who specializes in sleep will evaluate the results of your sleep study and share them with you and your primary health  care provider. Based on your results, your medical history, and a physical exam, you may be diagnosed with a sleep disorder, such as: Sleep apnea. Restless legs syndrome. Sleep-related behavior disorder. Sleep-related movement disorders. Sleep-related seizure disorders. Your health care team will help determine your treatment options based on your diagnosis. This may include: Improving your sleep habits (sleep hygiene). Wearing a continuous  positive airway pressure (CPAP) or bi-level positive airway pressure (BPAP) mask. Wearing an oral device at night to improve breathing and reduce snoring. Taking medicines. Follow these instructions at home: Take over-the-counter and prescription medicines only as told by your health care provider. If you are instructed to use a CPAP or BPAP mask, make sure you use it nightly as directed. Make any lifestyle changes that your health care provider recommends. If you were given a device to open your airway while you sleep, use it only as told by your health care provider. Do not use any tobacco products, such as cigarettes, chewing tobacco, and e-cigarettes. If you need help quitting, ask your health care provider. Keep all follow-up visits as told by your health care provider. This is important. Summary A sleep study (polysomnogram) is a series of tests done while you are sleeping. It shows how well you sleep. Most sleep studies are done over one full night of sleep. You will arrive at the study center in the evening and go home in the morning. If you have signs of the sleep disorder called sleep apnea during your test, you may get a treatment mask to wear for the second half of the night. A medical doctor who specializes in sleep will evaluate the results of your sleep study and share them with your primary health care provider. This information is not intended to replace advice given to you by your health care provider. Make sure you discuss any questions you have with your health care provider. Document Revised: 09/08/2019 Document Reviewed: 08/31/2017 Elsevier Patient Education  2022 [employer].  Fatigue If you have fatigue, you feel tired all the time and have a lack of energy or a lack of motivation. Fatigue may make it difficult to start or complete tasks because of exhaustion. In general, occasional or mild fatigue is often a normal response to activity or life. However, long-lasting  (chronic) or extreme fatigue may be a symptom of a medical condition. Follow these instructions at home: General instructions Watch your fatigue for any changes. Go to bed and get up at the same time every day. Avoid fatigue by pacing yourself during the day and getting enough sleep at night. Maintain a healthy weight. Medicines Take over-the-counter and prescription medicines only as told by your health care provider. Take a multivitamin, if told by your health care provider.  Do not use herbal or dietary supplements unless they are approved by your health care provider. Activity  Exercise regularly, as told by your health care provider. Use or practice techniques to help you relax, such as yoga, tai chi, meditation, or massage therapy. Eating and drinking  Avoid heavy meals in the evening. Eat a well-balanced diet, which includes lean proteins, whole grains, plenty of fruits and vegetables, and low-fat dairy products. Avoid consuming too much caffeine. Avoid the use of alcohol. Drink enough fluid to keep your urine pale yellow. Lifestyle Change situations that cause you stress. Try to keep your work and personal schedule in balance. Do not use any products that contain nicotine or tobacco, such as cigarettes and e-cigarettes. If you need  help quitting, ask your health care provider. Do not use drugs. Contact a health care provider if: Your fatigue does not get better. You have a fever. You suddenly lose or gain weight. You have headaches. You have trouble falling asleep or sleeping through the night. You feel angry, guilty, anxious, or sad. You are unable to have a bowel movement (constipation). Your skin is dry. You have swelling in your legs or another part of your body. Get help right away if: You feel confused. Your vision is blurry. You feel faint or you pass out. You have a severe headache. You have severe pain in your abdomen, your back, or the area between your  waist and hips (pelvis). You have chest pain, shortness of breath, or an irregular or fast heartbeat. You are unable to urinate, or you urinate less than normal. You have abnormal bleeding, such as bleeding from the rectum, vagina, nose, lungs, or nipples. You vomit blood. You have thoughts about hurting yourself or others. If you ever feel like you may hurt yourself or others, or have thoughts about taking your own life, get help right away. You can go to your nearest emergency department or call: Your local emergency services (911 in the [place] A suicide crisis helpline, such as the [place] at 712-744-5095. This is open 24 hours a day. Summary If you have fatigue, you feel tired all the time and have a lack of energy or a lack of motivation. Fatigue may make it difficult to start or complete tasks because of exhaustion. Long-lasting (chronic) or extreme fatigue may be a symptom of a medical condition. Exercise regularly, as told by your health care provider. Change situations that cause you stress. Try to keep your work and personal schedule in balance. This information is not intended to replace advice given to you by your health care provider. Make sure you discuss any questions you have with your health care provider. Document Revised: 06/13/2020 Document Reviewed: 06/13/2020 Elsevier Patient Education  2022 [employer].

## 2021-05-27 ENCOUNTER — Telehealth: Payer: Self-pay | Admitting: Physician Assistant

## 2021-05-27 ENCOUNTER — Inpatient Hospital Stay (HOSPITAL_BASED_OUTPATIENT_CLINIC_OR_DEPARTMENT_OTHER): Payer: BC Managed Care – PPO | Admitting: Physician Assistant

## 2021-05-27 ENCOUNTER — Other Ambulatory Visit: Payer: Self-pay

## 2021-05-27 ENCOUNTER — Inpatient Hospital Stay: Payer: BC Managed Care – PPO | Attending: Internal Medicine

## 2021-05-27 VITALS — BP 141/81 | HR 80 | Temp 99.1°F | Resp 17 | Wt 227.1 lb

## 2021-05-27 DIAGNOSIS — Z7984 Long term (current) use of oral hypoglycemic drugs: Secondary | ICD-10-CM | POA: Insufficient documentation

## 2021-05-27 DIAGNOSIS — Z794 Long term (current) use of insulin: Secondary | ICD-10-CM | POA: Diagnosis not present

## 2021-05-27 DIAGNOSIS — Z7952 Long term (current) use of systemic steroids: Secondary | ICD-10-CM | POA: Diagnosis not present

## 2021-05-27 DIAGNOSIS — E1165 Type 2 diabetes mellitus with hyperglycemia: Secondary | ICD-10-CM | POA: Insufficient documentation

## 2021-05-27 DIAGNOSIS — D693 Immune thrombocytopenic purpura: Secondary | ICD-10-CM | POA: Diagnosis not present

## 2021-05-27 DIAGNOSIS — D696 Thrombocytopenia, unspecified: Secondary | ICD-10-CM

## 2021-05-27 LAB — CBC WITH DIFFERENTIAL (CANCER CENTER ONLY)
Abs Immature Granulocytes: 0.04 10*3/uL (ref 0.00–0.07)
Basophils Absolute: 0 10*3/uL (ref 0.0–0.1)
Basophils Relative: 0 %
Eosinophils Absolute: 0 10*3/uL (ref 0.0–0.5)
Eosinophils Relative: 0 %
HCT: 34.9 % — ABNORMAL LOW (ref 36.0–46.0)
Hemoglobin: 11.6 g/dL — ABNORMAL LOW (ref 12.0–15.0)
Immature Granulocytes: 0 %
Lymphocytes Relative: 22 %
Lymphs Abs: 2.2 10*3/uL (ref 0.7–4.0)
MCH: 28.4 pg (ref 26.0–34.0)
MCHC: 33.2 g/dL (ref 30.0–36.0)
MCV: 85.5 fL (ref 80.0–100.0)
Monocytes Absolute: 0.7 10*3/uL (ref 0.1–1.0)
Monocytes Relative: 6 %
Neutro Abs: 7.4 10*3/uL (ref 1.7–7.7)
Neutrophils Relative %: 72 %
Platelet Count: 47 10*3/uL — ABNORMAL LOW (ref 150–400)
RBC: 4.08 MIL/uL (ref 3.87–5.11)
RDW: 12.5 % (ref 11.5–15.5)
WBC Count: 10.4 10*3/uL (ref 4.0–10.5)
nRBC: 0 % (ref 0.0–0.2)

## 2021-05-27 LAB — CMP (CANCER CENTER ONLY)
ALT: 39 U/L (ref 0–44)
AST: 13 U/L — ABNORMAL LOW (ref 15–41)
Albumin: 4.1 g/dL (ref 3.5–5.0)
Alkaline Phosphatase: 101 U/L (ref 38–126)
Anion gap: 9 (ref 5–15)
BUN: 17 mg/dL (ref 6–20)
CO2: 23 mmol/L (ref 22–32)
Calcium: 9.6 mg/dL (ref 8.9–10.3)
Chloride: 102 mmol/L (ref 98–111)
Creatinine: 1.1 mg/dL — ABNORMAL HIGH (ref 0.44–1.00)
GFR, Estimated: >60 mL/min (ref >60)
Glucose, Bld: 498 mg/dL — ABNORMAL HIGH (ref 70–99)
Potassium: 3.9 mmol/L (ref 3.5–5.1)
Sodium: 134 mmol/L — ABNORMAL LOW (ref 135–145)
Total Bilirubin: 0.9 mg/dL (ref 0.3–1.2)
Total Protein: 8.2 g/dL — ABNORMAL HIGH (ref 6.5–8.1)

## 2021-05-27 LAB — LACTATE DEHYDROGENASE: LDH: 162 U/L (ref 98–192)

## 2021-05-27 NOTE — Telephone Encounter (Signed)
I called the patient about her blood sugar on labs today. The patient just started her high dose prednisone taper for her ITP yesterday. She is diabetic and recently was taken off of her metformin and glipizide. Therefore, her BS was 498 today. Discussed that prednisone can increased her blood sugar. She has a glucometer and left over metformin and glipizide. I instructed her to monitor her blood sugar closely at home and take her medication. I instructed her to let her PCP know she is on a high dose prednisone taper and to see if they are in agreement with putting her back on her metformin and glipizide. If the patient is symptomatic from her hyperglycemia and/or has BS >500 despite taking her medication, she was advised to go to the emergency room.

## 2021-05-29 ENCOUNTER — Telehealth: Payer: Self-pay | Admitting: Physician Assistant

## 2021-05-29 NOTE — Telephone Encounter (Signed)
Left message with moved upcoming appointment due to provider's template.

## 2021-06-02 ENCOUNTER — Encounter: Payer: Self-pay | Admitting: Nurse Practitioner

## 2021-06-02 ENCOUNTER — Telehealth (INDEPENDENT_AMBULATORY_CARE_PROVIDER_SITE_OTHER): Payer: BC Managed Care – PPO | Admitting: Nurse Practitioner

## 2021-06-02 DIAGNOSIS — D696 Thrombocytopenia, unspecified: Secondary | ICD-10-CM | POA: Diagnosis not present

## 2021-06-02 DIAGNOSIS — E119 Type 2 diabetes mellitus without complications: Secondary | ICD-10-CM | POA: Diagnosis not present

## 2021-06-02 MED ORDER — HUMULIN 70/30 KWIKPEN (70-30) 100 UNIT/ML ~~LOC~~ SUPN: 20.0000 [IU] | PEN_INJECTOR | Freq: Two times a day (BID) | SUBCUTANEOUS | 2 refills | Status: DC

## 2021-06-02 MED ORDER — PEN NEEDLES 31G X 5 MM MISC: 1.0000 "pen " | Freq: Two times a day (BID) | 1 refills | Status: AC

## 2021-06-02 MED ORDER — METFORMIN HCL 500 MG PO TABS: 1000.0000 mg | ORAL_TABLET | Freq: Two times a day (BID) | ORAL | 11 refills | Status: DC

## 2021-06-02 NOTE — Progress Notes (Signed)
   [facility] Patient [facility] [address] [place], [place]  [address] Phone:  [phone]   Fax:  [phone] Virtual Visit via Telephone Note  I connected with Kellie Shaffer on 06/06/21 at 10:20 AM EDT by telephone and verified that I am speaking with the correct person using two identifiers.   I discussed the limitations, risks, security and privacy concerns of performing an evaluation and management service by telephone and the availability of in person appointments. I also discussed with the patient that there may be a patient responsible charge related to this service. The patient expressed understanding and agreed to proceed.  Patient home Provider Office  History of Present Illness:  Kellie Shaffer  has a past medical history of Abnormal Pap smear of cervix (2019), Abortion (May 2013), Anemia, Angio-edema, Anxiety, Diabetes mellitus without complication (HCC), Dizziness (11/22/2019), Hemoglobin A1c less than 7.0% (03/2020), History of ITP, History of palpitations, Hypercholesteremia, Hyperglycemia (03/2020), Hypertension, Hyperthyroidism, Iron deficiency anemia (09/04/2016), Peripheral vertigo, Recurrent upper respiratory infection (URI), Seasonal allergies (11/22/2019), Urticaria, Vertigo (11/22/2019), and Vertigo.   She report being placed on steroids because of her ITP. This has cause as elevation in her CBG >300. She reports having this been an issue in the past and she was placed on insulin therapy; Humilin 70/30. She is unsure of the duration of the prednisone therapy. She is wanting to avoid waiting for treatment her past CBG was >400 with this steriod regimen.   ROS   Observations/Objective: No exam; telephone visit  Assessment and Plan: 1. Type 2 diabetes mellitus without complication, without long-term current use of insulin (HCC) Added - insulin isophane & regular human KwikPen (HUMULIN 70/30 KWIKPEN) (70-30) 100 UNIT/ML KwikPen; Inject 20 Units into  the skin 2 (two) times daily.  Dispense: 12 mL; Refill: 2 - metFORMIN (GLUCOPHAGE) 500 MG tablet; Take 2 tablets (1,000 mg total) by mouth 2 (two) times daily with a meal.  Dispense: 120 tablet; Refill: 11 - Insulin Pen Needle (PEN NEEDLES) 31G X 5 MM MISC; 1 pen by Does not apply route 2 (two) times daily.  Dispense: 100 each; Refill: 1  2. Thrombocytopenia (HCC) Followed by hematology on a prednisone regimen.  Follow Up Instructions:  As scheduled   I discussed the assessment and treatment plan with the patient. The patient was provided an opportunity to ask questions and all were answered. The patient agreed with the plan and demonstrated an understanding of the instructions.   The patient was advised to call back or seek an in-person evaluation if the symptoms worsen or if the condition fails to improve as anticipated.  I provided 11 minutes of telephone- visit time during this encounter.   Barbette Merino, NP

## 2021-06-03 NOTE — Progress Notes (Signed)
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   CURRENT THERAPY:   INTERVAL HISTORY: Kellie Shaffer 30 y.o. female returns to the clinic today a for follow-up visit.  The patient saw Dr. Arbutus Ped on 05/15/2021 after being lost to follow-up for several months.  She was endorsing increased fatigue and weakness and presented to the emergency room for evaluation.  She was found to have a significant drop in her hemoglobin and hematocrit and platelet count.  At her follow-up appointment with Dr. Arbutus Ped, Dr. Arbutus Ped put her on a high-dose prednisone taper which she started on 05/26/21.  She is currently taking 80 milligrams of prednisone daily which she started on 06/09/21.  The patient has diabetes and has had some hyperglycemia with the prednisone use.  She previously was taking metformin and glipizide which was discontinued before her high-dose prednisone taper.  She has been monitoring her blood sugars closely at home. She restarted insulin and metformin.   Overall the patient is feeling well today.  She denies any abnormal bleeding or bruising except she had a heavy menstrual period on 05/30/21 with significant bleeding. She is receiving depo injections by her PCP. She is due for depo today.  Her energy is improved. She denies any headache or visual changes.  She denies any nausea, vomiting, diarrhea, constipation, or abdominal pain.  Denies GI upset with prednisone. Denies chest pain, lightheadedness, shortness of breath.  She is here today for evaluation and repeat blood work.   MEDICAL HISTORY: Past Medical History:  Diagnosis Date   Abnormal Pap smear of cervix 2019   LSIL   Abortion May 2013   Anemia    Angio-edema    Anxiety    Diabetes mellitus without complication (HCC)     Dizziness 11/22/2019   Hemoglobin A1c less than 7.0% 03/2020   History of ITP    History of palpitations    Cardiac event monitor June 2019: Normal.  Sinus rhythm no arrhythmias or significant PACs/PVCs.   Hypercholesteremia    Hyperglycemia 03/2020   Hypertension    Hyperthyroidism    Iron deficiency anemia 09/04/2016   Peripheral vertigo    Recurrent upper respiratory infection (URI)    Seasonal allergies 11/22/2019   Urticaria    Vertigo 11/22/2019   Vertigo     ALLERGIES:  is allergic to benadryl [diphenhydramine hcl] and chloroxine.  MEDICATIONS:  Current Outpatient Medications  Medication Sig Dispense Refill   atorvastatin (LIPITOR) 40 MG tablet Take 1 tablet (40 mg total) by mouth daily. 90 tablet 3   Biotin 12878 MCG TABS Take 1,000 mcg by mouth daily.      busPIRone (BUSPAR) 10 MG tablet Take 1 tablet (10 mg total) by mouth daily. 90 tablet 3   cetirizine (ZYRTEC) 10 MG tablet Take 1 tablet (10 mg total) by mouth daily as needed for allergies. 90 tablet 1   famotidine (PEPCID) 20 MG tablet Take 20 mg by mouth 2 (two) times daily.     ferrous sulfate 325 (65 FE) MG tablet Take 1 tablet (325 mg total) by mouth 2 (two) times daily with a meal. 60 tablet 6   glipiZIDE (GLUCOTROL) 10 MG tablet Take 1 tablet (10 mg total) by mouth 2 (two) times daily before a meal. 180 tablet 3  insulin isophane & regular human KwikPen (HUMULIN 70/30 KWIKPEN) (70-30) 100 UNIT/ML KwikPen Inject 20 Units into the skin 2 (two) times daily. 12 mL 2   Insulin Pen Needle (PEN NEEDLES) 31G X 5 MM MISC 1 pen by Does not apply route 2 (two) times daily. 100 each 1   lisinopril (ZESTRIL) 5 MG tablet Take 1 tablet (5 mg total) by mouth daily. 90 tablet 3   medroxyPROGESTERone (DEPO-PROVERA) 150 MG/ML injection Inject 1 mL (150 mg total) into the muscle once for 1 dose. Every 90 days. 1 mL 02   metFORMIN (GLUCOPHAGE) 500 MG tablet Take 2 tablets (1,000 mg total) by mouth 2 (two) times daily with a meal. 120  tablet 11   methimazole (TAPAZOLE) 10 MG tablet TAKE 1 TABLET DAILY 90 tablet 3   metoprolol tartrate (LOPRESSOR) 25 MG tablet TAKE ONE-HALF (1/2) TABLET EVERY MORNING 90 tablet 3   Multiple Vitamins-Minerals (MULTIVITAMIN ADULT PO) Take by mouth daily.     omeprazole (PRILOSEC) 40 MG capsule Take 1 capsule (40 mg total) by mouth daily. 30 capsule 1   predniSONE (DELTASONE) 20 MG tablet 5 tablet p.o. daily for 2 weeks followed by 4 tablet p.o. daily for 1 week followed by 3 tablets p.o. daily for 1 week followed by 2 tablet p.o. daily for 1 week followed by 1 tablet p.o. daily for 1 week then half a tablet p.o. daily for 1 week. 144 tablet 0   Probiotic Product (PROBIOTIC-10 PO) Take by mouth. 1 caples     No current facility-administered medications for this visit.    SURGICAL HISTORY:  Past Surgical History:  Procedure Laterality Date   LAPAROSCOPIC APPENDECTOMY N/A 01/14/2017   Procedure: APPENDECTOMY LAPAROSCOPIC;  Surgeon: Claud Kelp, MD;  Location: [facility];  Service: General;  Laterality: N/A;    REVIEW OF SYSTEMS:   Review of Systems  Constitutional: Negative for appetite change, chills, fatigue, fever and unexpected weight change.  HENT:  Negative for mouth sores, nosebleeds, sore throat and trouble swallowing.   Eyes: Negative for eye problems and icterus.  Respiratory: Negative for cough, hemoptysis, shortness of breath and wheezing.   Cardiovascular: Negative for chest pain and leg swelling.  Gastrointestinal: Negative for abdominal pain, constipation, diarrhea, nausea and vomiting.  Genitourinary: Negative for bladder incontinence, difficulty urinating, dysuria, frequency and hematuria.   Musculoskeletal: Negative for back pain, gait problem, neck pain and neck stiffness.  Skin: Negative for itching and rash.  Neurological: Negative for dizziness, extremity weakness, gait problem, headaches, light-headedness and seizures.  Hematological: Negative for adenopathy. Does not  bruise/bleed easily.  Psychiatric/Behavioral: Negative for confusion, depression and sleep disturbance. The patient is not nervous/anxious.     PHYSICAL EXAMINATION:  Blood pressure 130/76, pulse 80, temperature 98.1 F (36.7 C), temperature source Oral, resp. rate 18, height 5\' 9"  (1.753 m), weight 220 lb 12.8 oz (100.2 kg), SpO2 100 %.  ECOG PERFORMANCE STATUS: 1  Physical Exam  Constitutional: Oriented to person, place, and time and well-developed, well-nourished, and in no distress.  HENT:  Head: Normocephalic and atraumatic.  Mouth/Throat: Oropharynx is clear and moist. No oropharyngeal exudate.  Eyes: Conjunctivae are normal. Right eye exhibits no discharge. Left eye exhibits no discharge. No scleral icterus.  Neck: Normal range of motion. Neck supple.  Cardiovascular: Normal rate, regular rhythm, normal heart sounds and intact distal pulses.   Pulmonary/Chest: Effort normal and breath sounds normal. No respiratory distress. No wheezes. No rales.  Abdominal: Soft. Bowel sounds are normal. Exhibits no distension  and no mass. There is no tenderness.  Musculoskeletal: Normal range of motion. Exhibits no edema.  Lymphadenopathy:    No cervical adenopathy.  Neurological: Alert and oriented to person, place, and time. Exhibits normal muscle tone. Gait normal. Coordination normal.  Skin: Skin is warm and dry. No rash noted. Not diaphoretic. No erythema. No pallor.  Psychiatric: Mood, memory and judgment normal.  Vitals reviewed.  LABORATORY DATA: Lab Results  Component Value Date   WBC 15.5 (H) 06/11/2021   HGB 11.8 (L) 06/11/2021   HCT 34.7 (L) 06/11/2021   MCV 84.2 06/11/2021   PLT 228 06/11/2021      Chemistry      Component Value Date/Time   NA 138 06/11/2021 1104   NA 140 10/02/2020 1132   NA 132 (L) 08/13/2017 1044   K 3.6 06/11/2021 1104   K 4.3 08/13/2017 1044   CL 104 06/11/2021 1104   CO2 22 06/11/2021 1104   CO2 25 08/13/2017 1044   BUN 19 06/11/2021 1104    BUN 10 10/02/2020 1132   BUN 18.6 08/13/2017 1044   CREATININE 0.81 06/11/2021 1104   CREATININE 1.3 (H) 08/13/2017 1044   GLU 517 (H) 08/13/2017 1257      Component Value Date/Time   CALCIUM 9.8 06/11/2021 1104   CALCIUM 9.9 08/13/2017 1044   ALKPHOS 75 06/11/2021 1104   ALKPHOS 100 08/13/2017 1044   AST 9 (L) 06/11/2021 1104   AST 5 08/13/2017 1044   ALT 25 06/11/2021 1104   ALT 15 08/13/2017 1044   BILITOT 0.7 06/11/2021 1104   BILITOT 0.52 08/13/2017 1044       RADIOGRAPHIC STUDIES:  No results found.   ASSESSMENT/PLAN:  This is a very pleasant 30 year old African-American female with idiopathic thrombocytopenia purpura.  She was treated with a prednisone taper because of her significant thrombocytopenia.    In September 2022, a repeat CBC showed significant decline in her hemoglobin and hematocrit as well as her platelet count.  Dr. Arbutus Ped placed her back on a high-dose prednisone taper for which she is currently taking 80 milligrams daily.  She has diabetes and this is caused some hyperglycemia for which she was placed back on metformin and insulin.  She is making sure to monitor her blood sugar closely at home.  Patient was seen with Dr. Arbutus Ped today.  Labs are reviewed.  Her labs today show improved platelet count at 228k. Her Hbg is WNL as well. Her WBC is elevated likely due to the prednisone use.   We will see her back for follow-up visit in 3.5 weeks for evaluation and repeat blood work.  She will continue on her prednisone taper.  The patient was advised to call immediately if she has any concerning symptoms in the interval. The patient voices understanding of current disease status and treatment options and is in agreement with the current care plan. All questions were answered. The patient knows to call the clinic with any problems, questions or concerns. We can certainly see the patient much sooner if necessary  Orders Placed This Encounter  Procedures    CBC with Differential (Cancer Center Only)    Standing Status:   Future    Standing Expiration Date:   06/11/2022   CMP (Cancer Center only)    Standing Status:   Future    Standing Expiration Date:   06/11/2022   Lactate dehydrogenase (LDH)    Standing Status:   Future    Standing Expiration Date:   06/11/2022  Godwin Tedesco L Tanza Pellot, PA-C 06/11/21  ADDENDUM: Hematology/Oncology Attending: I had a face-to-face encounter with the patient today.  I reviewed her records, lab and recommended her care plan.  This is a very pleasant 30 years old African-American female with high ITP with recent relapse.  The patient started on treatment with a tapered dose of prednisone few weeks ago and currently on 40 mg p.o. daily started 3 days ago.  She has been tolerating her treatment with prednisone fairly well except for the hyperglycemia.  She is currently on treatment with metformin and insulin by her primary care physician. The patient is feeling much better and repeat CBC today showed significant improvement in her platelets count up to 228,000. I recommended for the patient to continue with a tapered dose of prednisone as planned. I will see her back for follow-up visit in around 3 weeks for evaluation with repeat CBC, comprehensive metabolic panel and LDH. The patient was advised to call immediately if she has any other concerning symptoms in the interval.  The total time spent in the appointment was 15 minutes. Disclaimer: This note was dictated with voice recognition software. Similar sounding words can inadvertently be transcribed and may be missed upon review. Lajuana Matte, MD 06/11/21

## 2021-06-10 ENCOUNTER — Other Ambulatory Visit: Payer: Self-pay

## 2021-06-10 DIAGNOSIS — D696 Thrombocytopenia, unspecified: Secondary | ICD-10-CM

## 2021-06-11 ENCOUNTER — Other Ambulatory Visit: Payer: Self-pay

## 2021-06-11 ENCOUNTER — Encounter: Payer: Self-pay | Admitting: Physician Assistant

## 2021-06-11 ENCOUNTER — Ambulatory Visit (INDEPENDENT_AMBULATORY_CARE_PROVIDER_SITE_OTHER): Payer: BC Managed Care – PPO | Admitting: *Deleted

## 2021-06-11 ENCOUNTER — Other Ambulatory Visit: Payer: BC Managed Care – PPO

## 2021-06-11 ENCOUNTER — Inpatient Hospital Stay (HOSPITAL_BASED_OUTPATIENT_CLINIC_OR_DEPARTMENT_OTHER): Payer: BC Managed Care – PPO | Admitting: Physician Assistant

## 2021-06-11 ENCOUNTER — Inpatient Hospital Stay: Payer: BC Managed Care – PPO

## 2021-06-11 ENCOUNTER — Ambulatory Visit: Payer: BC Managed Care – PPO | Admitting: Physician Assistant

## 2021-06-11 VITALS — BP 130/76 | HR 80 | Temp 98.1°F | Resp 18 | Ht 69.0 in | Wt 220.8 lb

## 2021-06-11 DIAGNOSIS — D696 Thrombocytopenia, unspecified: Secondary | ICD-10-CM | POA: Diagnosis not present

## 2021-06-11 DIAGNOSIS — Z7984 Long term (current) use of oral hypoglycemic drugs: Secondary | ICD-10-CM | POA: Diagnosis not present

## 2021-06-11 DIAGNOSIS — Z3042 Encounter for surveillance of injectable contraceptive: Secondary | ICD-10-CM | POA: Diagnosis not present

## 2021-06-11 DIAGNOSIS — D693 Immune thrombocytopenic purpura: Secondary | ICD-10-CM | POA: Diagnosis not present

## 2021-06-11 DIAGNOSIS — Z862 Personal history of diseases of the blood and blood-forming organs and certain disorders involving the immune mechanism: Secondary | ICD-10-CM | POA: Diagnosis not present

## 2021-06-11 DIAGNOSIS — E1165 Type 2 diabetes mellitus with hyperglycemia: Secondary | ICD-10-CM | POA: Diagnosis not present

## 2021-06-11 DIAGNOSIS — Z7952 Long term (current) use of systemic steroids: Secondary | ICD-10-CM | POA: Diagnosis not present

## 2021-06-11 DIAGNOSIS — Z794 Long term (current) use of insulin: Secondary | ICD-10-CM | POA: Diagnosis not present

## 2021-06-11 LAB — CMP (CANCER CENTER ONLY)
ALT: 25 U/L (ref 0–44)
AST: 9 U/L — ABNORMAL LOW (ref 15–41)
Albumin: 4.1 g/dL (ref 3.5–5.0)
Alkaline Phosphatase: 75 U/L (ref 38–126)
Anion gap: 12 (ref 5–15)
BUN: 19 mg/dL (ref 6–20)
CO2: 22 mmol/L (ref 22–32)
Calcium: 9.8 mg/dL (ref 8.9–10.3)
Chloride: 104 mmol/L (ref 98–111)
Creatinine: 0.81 mg/dL (ref 0.44–1.00)
GFR, Estimated: >60 mL/min (ref >60)
Glucose, Bld: 214 mg/dL — ABNORMAL HIGH (ref 70–99)
Potassium: 3.6 mmol/L (ref 3.5–5.1)
Sodium: 138 mmol/L (ref 135–145)
Total Bilirubin: 0.7 mg/dL (ref 0.3–1.2)
Total Protein: 7.3 g/dL (ref 6.5–8.1)

## 2021-06-11 LAB — CBC WITH DIFFERENTIAL (CANCER CENTER ONLY)
Abs Immature Granulocytes: 0.1 10*3/uL — ABNORMAL HIGH (ref 0.00–0.07)
Basophils Absolute: 0 10*3/uL (ref 0.0–0.1)
Basophils Relative: 0 %
Eosinophils Absolute: 0.1 10*3/uL (ref 0.0–0.5)
Eosinophils Relative: 1 %
HCT: 34.7 % — ABNORMAL LOW (ref 36.0–46.0)
Hemoglobin: 11.8 g/dL — ABNORMAL LOW (ref 12.0–15.0)
Immature Granulocytes: 1 %
Lymphocytes Relative: 33 %
Lymphs Abs: 5 10*3/uL — ABNORMAL HIGH (ref 0.7–4.0)
MCH: 28.6 pg (ref 26.0–34.0)
MCHC: 34 g/dL (ref 30.0–36.0)
MCV: 84.2 fL (ref 80.0–100.0)
Monocytes Absolute: 1 10*3/uL (ref 0.1–1.0)
Monocytes Relative: 6 %
Neutro Abs: 9.3 10*3/uL — ABNORMAL HIGH (ref 1.7–7.7)
Neutrophils Relative %: 59 %
Platelet Count: 228 10*3/uL (ref 150–400)
RBC: 4.12 MIL/uL (ref 3.87–5.11)
RDW: 12.9 % (ref 11.5–15.5)
WBC Count: 15.5 10*3/uL — ABNORMAL HIGH (ref 4.0–10.5)
nRBC: 0 % (ref 0.0–0.2)

## 2021-06-11 LAB — LACTATE DEHYDROGENASE: LDH: 120 U/L (ref 98–192)

## 2021-06-11 MED ORDER — MEDROXYPROGESTERONE ACETATE 150 MG/ML IM SUSP
150.0000 mg | Freq: Once | INTRAMUSCULAR | Status: AC
  Administered 2021-06-11: 150 mg via INTRAMUSCULAR

## 2021-06-11 NOTE — Progress Notes (Signed)
Jan 11 th - Jan 25 th

## 2021-07-01 NOTE — Progress Notes (Signed)
[facility] OFFICE PROGRESS NOTE  Vevelyn Francois, NP [address] 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  INTERVAL HISTORY: Kellie Shaffer 30 y.o. female returns to the clinic today a for follow-up visit.  The patient saw Dr. Julien Nordmann on 05/15/2021 after being lost to follow-up for several months.  She was endorsing increased fatigue and weakness and presented to the emergency room for evaluation.  She was found to have a significant drop in her hemoglobin and hematocrit and platelet count.  At her follow-up appointment with Dr. Julien Nordmann, Dr. Julien Nordmann put her on a high-dose prednisone taper which she started on 05/26/21.  She is currently taking 10 milligrams of prednisone daily which she started today. She is expected to complete her taper on 07/13/21.  She has been monitoring her blood sugars closely at home. She restarted insulin and metformin for her diabetes. The metformin has changed her stool caliber to be looser.  Overall the patient is feeling well today.  She denies any abnormal bleeding or bruising except she has mild blood tinged nasal mucus. She is receiving depo injections by her PCP for her heavy menstrual periods. Her energy is stable. She denies any headache or visual changes.  She denies any nausea, vomiting, constipation, or abdominal pain.  Denies GI upset with prednisone. Denies chest pain, lightheadedness, shortness of breath.  She is here today for evaluation and repeat blood work.  MEDICAL HISTORY: Past Medical History:  Diagnosis Date   Abnormal Pap smear of cervix 2019   LSIL   Abortion May 2013   Anemia    Angio-edema    Anxiety    Diabetes mellitus without complication ([place])    Dizziness 11/22/2019   Hemoglobin A1c less than 7.0% 03/2020   History of ITP    History of  palpitations    Cardiac event monitor June 2019: Normal.  Sinus rhythm no arrhythmias or significant PACs/PVCs.   Hypercholesteremia    Hyperglycemia 03/2020   Hypertension    Hyperthyroidism    Iron deficiency anemia 09/04/2016   Peripheral vertigo    Recurrent upper respiratory infection (URI)    Seasonal allergies 11/22/2019   Urticaria    Vertigo 11/22/2019   Vertigo     ALLERGIES:  is allergic to benadryl [diphenhydramine hcl] and chloroxine.  MEDICATIONS:  Current Outpatient Medications  Medication Sig Dispense Refill   atorvastatin (LIPITOR) 40 MG tablet Take 1 tablet (40 mg total) by mouth daily. 90 tablet 3   Biotin 10000 MCG TABS Take 1,000 mcg by mouth daily.      busPIRone (BUSPAR) 10 MG tablet Take 1 tablet (10 mg total) by mouth daily. 90 tablet 3   cetirizine (ZYRTEC) 10 MG tablet Take 1 tablet (10 mg total) by mouth daily as needed for allergies. 90 tablet 1   famotidine (PEPCID) 20 MG tablet Take 20 mg by mouth 2 (two) times daily.     ferrous sulfate 325 (65 FE) MG tablet Take 1 tablet (325 mg total) by mouth 2 (two) times daily with a meal. 60 tablet 6   glipiZIDE (GLUCOTROL) 10 MG tablet Take 1 tablet (10 mg total) by mouth 2 (two) times daily before a meal. 180 tablet 3   lisinopril (ZESTRIL) 5 MG tablet Take 1 tablet (5 mg total) by mouth daily. 90 tablet 3  metFORMIN (GLUCOPHAGE) 500 MG tablet Take 2 tablets (1,000 mg total) by mouth 2 (two) times daily with a meal. 120 tablet 11   methimazole (TAPAZOLE) 10 MG tablet TAKE 1 TABLET DAILY 90 tablet 3   metoprolol tartrate (LOPRESSOR) 25 MG tablet TAKE ONE-HALF (1/2) TABLET EVERY MORNING 90 tablet 3   Multiple Vitamins-Minerals (MULTIVITAMIN ADULT PO) Take by mouth daily.     omeprazole (PRILOSEC) 40 MG capsule Take 1 capsule (40 mg total) by mouth daily. 30 capsule 1   predniSONE (DELTASONE) 20 MG tablet 5 tablet p.o. daily for 2 weeks followed by 4 tablet p.o. daily for 1 week followed by 3 tablets p.o. daily  for 1 week followed by 2 tablet p.o. daily for 1 week followed by 1 tablet p.o. daily for 1 week then half a tablet p.o. daily for 1 week. 144 tablet 0   Probiotic Product (PROBIOTIC-10 PO) Take by mouth. 1 caples     insulin isophane & regular human KwikPen (HUMULIN 70/30 KWIKPEN) (70-30) 100 UNIT/ML KwikPen Inject 20 Units into the skin 2 (two) times daily. 12 mL 2   medroxyPROGESTERone (DEPO-PROVERA) 150 MG/ML injection Inject 1 mL (150 mg total) into the muscle once for 1 dose. Every 90 days. 1 mL 02   No current facility-administered medications for this visit.    SURGICAL HISTORY:  Past Surgical History:  Procedure Laterality Date   LAPAROSCOPIC APPENDECTOMY N/A 01/14/2017   Procedure: APPENDECTOMY LAPAROSCOPIC;  Surgeon: Fanny Skates, MD;  Location: [facility];  Service: General;  Laterality: N/A;    REVIEW OF SYSTEMS:   Review of Systems  Constitutional: Negative for appetite change, chills, fatigue, fever and unexpected weight change.  HENT:   Negative for mouth sores, nosebleeds, sore throat and trouble swallowing.   Eyes: Negative for eye problems and icterus.  Respiratory: Negative for cough, hemoptysis, shortness of breath and wheezing.   Cardiovascular: Negative for chest pain and leg swelling.  Gastrointestinal: Positive for looser stools. Negative for abdominal pain, constipation,  nausea and vomiting.  Genitourinary: Negative for bladder incontinence, difficulty urinating, dysuria, frequency and hematuria.   Musculoskeletal: Negative for back pain, gait problem, neck pain and neck stiffness.  Skin: Negative for itching and rash.  Neurological: Negative for dizziness, extremity weakness, gait problem, headaches, light-headedness and seizures.  Hematological: Negative for adenopathy. Does not bruise/bleed easily.  Psychiatric/Behavioral: Negative for confusion, depression and sleep disturbance. The patient is not nervous/anxious.     PHYSICAL EXAMINATION:  Blood pressure  (!) 137/91, pulse 79, temperature 98 F (36.7 C), temperature source Tympanic, resp. rate 17, weight 221 lb (100.2 kg), SpO2 100 %.  ECOG PERFORMANCE STATUS: 0  Physical Exam  Constitutional: Oriented to person, place, and time and well-developed, well-nourished, and in no distress.  HENT:  Head: Normocephalic and atraumatic.  Mouth/Throat: Oropharynx is clear and moist. No oropharyngeal exudate.  Eyes: Conjunctivae are normal. Right eye exhibits no discharge. Left eye exhibits no discharge. No scleral icterus.  Neck: Normal range of motion. Neck supple.  Cardiovascular: Normal rate, regular rhythm, normal heart sounds and intact distal pulses.   Pulmonary/Chest: Effort normal and breath sounds normal. No respiratory distress. No wheezes. No rales.  Abdominal: Soft. Bowel sounds are normal. Exhibits no distension and no mass. There is no tenderness.  Musculoskeletal: Normal range of motion. Exhibits no edema.  Lymphadenopathy:    No cervical adenopathy.  Neurological: Alert and oriented to person, place, and time. Exhibits normal muscle tone. Gait normal. Coordination normal.  Skin: Skin is  warm and dry. No rash noted. Not diaphoretic. No erythema. No pallor.  Psychiatric: Mood, memory and judgment normal.  Vitals reviewed.  LABORATORY DATA: Lab Results  Component Value Date   WBC 10.2 07/07/2021   HGB 11.7 (L) 07/07/2021   HCT 35.3 (L) 07/07/2021   MCV 86.7 07/07/2021   PLT 188 07/07/2021      Chemistry      Component Value Date/Time   NA 136 07/07/2021 0751   NA 140 10/02/2020 1132   NA 132 (L) 08/13/2017 1044   K 3.5 07/07/2021 0751   K 4.3 08/13/2017 1044   CL 104 07/07/2021 0751   CO2 24 07/07/2021 0751   CO2 25 08/13/2017 1044   BUN 30 (H) 07/07/2021 0751   BUN 10 10/02/2020 1132   BUN 18.6 08/13/2017 1044   CREATININE 0.76 07/07/2021 0751   CREATININE 1.3 (H) 08/13/2017 1044   GLU 517 (H) 08/13/2017 1257      Component Value Date/Time   CALCIUM 9.3  07/07/2021 0751   CALCIUM 9.9 08/13/2017 1044   ALKPHOS 59 07/07/2021 0751   ALKPHOS 100 08/13/2017 1044   AST 12 (L) 07/07/2021 0751   AST 5 08/13/2017 1044   ALT 26 07/07/2021 0751   ALT 15 08/13/2017 1044   BILITOT 0.5 07/07/2021 0751   BILITOT 0.52 08/13/2017 1044       RADIOGRAPHIC STUDIES:  No results found.   ASSESSMENT/PLAN:  This is a very pleasant 30 year old African-American female with idiopathic thrombocytopenia purpura.  She was treated with a prednisone taper because of her significant thrombocytopenia.     In September 2022, a repeat CBC showed significant decline in her hemoglobin and hematocrit as well as her platelet count.  Dr. Arbutus Ped placed her back on a high-dose prednisone taper for which she is currently taking 10 milligrams daily.  She has diabetes and this is caused some hyperglycemia for which she was placed back on metformin and insulin.  She is making sure to monitor her blood sugar closely at home.    Labs are reviewed.  Her labs today show a normal platelet count at 188k. Her Hbg is 11.7 as well. Her WBC is 10.2.   I reviewed her labs with Dr. Arbutus Ped. We will see her back for follow-up visit in 3 months for evaluation and repeat blood work.   The patient was advised to call immediately if she has any concerning symptoms in the interval. The patient voices understanding of current disease status and treatment options and is in agreement with the current care plan. All questions were answered. The patient knows to call the clinic with any problems, questions or concerns. We can certainly see the patient much sooner if necessary      Orders Placed This Encounter  Procedures   CBC with Differential (Cancer Center Only)    Standing Status:   Future    Standing Expiration Date:   07/07/2022   CMP (Cancer Center only)    Standing Status:   Future    Standing Expiration Date:   07/07/2022   Lactate dehydrogenase (LDH)    Standing Status:   Future     Standing Expiration Date:   07/07/2022     The total time spent in the appointment was 20-29 minutes.   Kellie Hoes L Jadrian Bulman, PA-C 07/07/21

## 2021-07-07 ENCOUNTER — Inpatient Hospital Stay: Payer: Self-pay | Attending: Internal Medicine

## 2021-07-07 ENCOUNTER — Other Ambulatory Visit: Payer: Self-pay

## 2021-07-07 ENCOUNTER — Inpatient Hospital Stay (HOSPITAL_BASED_OUTPATIENT_CLINIC_OR_DEPARTMENT_OTHER): Payer: Self-pay | Admitting: Physician Assistant

## 2021-07-07 ENCOUNTER — Encounter: Payer: Self-pay | Admitting: Physician Assistant

## 2021-07-07 VITALS — BP 137/91 | HR 79 | Temp 98.0°F | Resp 17 | Wt 221.0 lb

## 2021-07-07 DIAGNOSIS — Z7952 Long term (current) use of systemic steroids: Secondary | ICD-10-CM | POA: Insufficient documentation

## 2021-07-07 DIAGNOSIS — D693 Immune thrombocytopenic purpura: Secondary | ICD-10-CM | POA: Insufficient documentation

## 2021-07-07 DIAGNOSIS — Z7984 Long term (current) use of oral hypoglycemic drugs: Secondary | ICD-10-CM | POA: Insufficient documentation

## 2021-07-07 DIAGNOSIS — E119 Type 2 diabetes mellitus without complications: Secondary | ICD-10-CM | POA: Insufficient documentation

## 2021-07-07 DIAGNOSIS — D696 Thrombocytopenia, unspecified: Secondary | ICD-10-CM

## 2021-07-07 DIAGNOSIS — N920 Excessive and frequent menstruation with regular cycle: Secondary | ICD-10-CM | POA: Insufficient documentation

## 2021-07-07 DIAGNOSIS — Z862 Personal history of diseases of the blood and blood-forming organs and certain disorders involving the immune mechanism: Secondary | ICD-10-CM

## 2021-07-07 LAB — CMP (CANCER CENTER ONLY)
ALT: 26 U/L (ref 0–44)
AST: 12 U/L — ABNORMAL LOW (ref 15–41)
Albumin: 4.2 g/dL (ref 3.5–5.0)
Alkaline Phosphatase: 59 U/L (ref 38–126)
Anion gap: 8 (ref 5–15)
BUN: 30 mg/dL — ABNORMAL HIGH (ref 6–20)
CO2: 24 mmol/L (ref 22–32)
Calcium: 9.3 mg/dL (ref 8.9–10.3)
Chloride: 104 mmol/L (ref 98–111)
Creatinine: 0.76 mg/dL (ref 0.44–1.00)
GFR, Estimated: >60 mL/min (ref >60)
Glucose, Bld: 165 mg/dL — ABNORMAL HIGH (ref 70–99)
Potassium: 3.5 mmol/L (ref 3.5–5.1)
Sodium: 136 mmol/L (ref 135–145)
Total Bilirubin: 0.5 mg/dL (ref 0.3–1.2)
Total Protein: 7.2 g/dL (ref 6.5–8.1)

## 2021-07-07 LAB — CBC WITH DIFFERENTIAL (CANCER CENTER ONLY)
Abs Immature Granulocytes: 0.03 10*3/uL (ref 0.00–0.07)
Basophils Absolute: 0 10*3/uL (ref 0.0–0.1)
Basophils Relative: 0 %
Eosinophils Absolute: 0.1 10*3/uL (ref 0.0–0.5)
Eosinophils Relative: 1 %
HCT: 35.3 % — ABNORMAL LOW (ref 36.0–46.0)
Hemoglobin: 11.7 g/dL — ABNORMAL LOW (ref 12.0–15.0)
Immature Granulocytes: 0 %
Lymphocytes Relative: 41 %
Lymphs Abs: 4.2 10*3/uL — ABNORMAL HIGH (ref 0.7–4.0)
MCH: 28.7 pg (ref 26.0–34.0)
MCHC: 33.1 g/dL (ref 30.0–36.0)
MCV: 86.7 fL (ref 80.0–100.0)
Monocytes Absolute: 0.7 10*3/uL (ref 0.1–1.0)
Monocytes Relative: 7 %
Neutro Abs: 5.3 10*3/uL (ref 1.7–7.7)
Neutrophils Relative %: 51 %
Platelet Count: 188 10*3/uL (ref 150–400)
RBC: 4.07 MIL/uL (ref 3.87–5.11)
RDW: 13.1 % (ref 11.5–15.5)
WBC Count: 10.2 10*3/uL (ref 4.0–10.5)
nRBC: 0 % (ref 0.0–0.2)

## 2021-07-07 LAB — LACTATE DEHYDROGENASE: LDH: 166 U/L (ref 98–192)

## 2021-07-16 ENCOUNTER — Telehealth: Payer: Self-pay | Admitting: Internal Medicine

## 2021-07-16 ENCOUNTER — Telehealth: Payer: Self-pay

## 2021-07-16 NOTE — Telephone Encounter (Signed)
Entire of her med's to be sent to [facility] on [address]  Lisinopril Metoprolol  Atorvastatin  She left messages on voice mail

## 2021-07-16 NOTE — Telephone Encounter (Signed)
Left voicemail for patient to call back. She is able to contact [facility] and have them transferred to [facility].

## 2021-07-16 NOTE — Telephone Encounter (Signed)
Sch per 11/17 los, pt aware

## 2021-07-18 ENCOUNTER — Other Ambulatory Visit: Payer: Self-pay

## 2021-07-18 ENCOUNTER — Telehealth: Payer: Self-pay | Admitting: Endocrinology

## 2021-07-18 DIAGNOSIS — R7309 Other abnormal glucose: Secondary | ICD-10-CM

## 2021-07-18 DIAGNOSIS — E119 Type 2 diabetes mellitus without complications: Secondary | ICD-10-CM

## 2021-07-18 MED ORDER — GLIPIZIDE 10 MG PO TABS: 10.0000 mg | ORAL_TABLET | Freq: Two times a day (BID) | ORAL | 3 refills | Status: DC

## 2021-07-18 MED ORDER — LISINOPRIL 5 MG PO TABS: 5.0000 mg | ORAL_TABLET | Freq: Every day | ORAL | 3 refills | Status: DC

## 2021-07-18 MED ORDER — ATORVASTATIN CALCIUM 40 MG PO TABS: 40.0000 mg | ORAL_TABLET | Freq: Every day | ORAL | 3 refills | Status: DC

## 2021-07-18 MED ORDER — METHIMAZOLE 10 MG PO TABS: 10.0000 mg | ORAL_TABLET | Freq: Every day | ORAL | 1 refills | Status: DC

## 2021-07-18 NOTE — Telephone Encounter (Signed)
Script sent  

## 2021-07-22 MED ORDER — METOPROLOL TARTRATE 25 MG PO TABS: ORAL_TABLET | ORAL | 3 refills | Status: DC

## 2021-07-23 NOTE — Telephone Encounter (Signed)
Error

## 2021-09-01 ENCOUNTER — Ambulatory Visit (INDEPENDENT_AMBULATORY_CARE_PROVIDER_SITE_OTHER): Payer: No Typology Code available for payment source

## 2021-09-01 ENCOUNTER — Other Ambulatory Visit: Payer: Self-pay

## 2021-09-01 DIAGNOSIS — Z3042 Encounter for surveillance of injectable contraceptive: Secondary | ICD-10-CM | POA: Diagnosis not present

## 2021-09-01 MED ORDER — MEDROXYPROGESTERONE ACETATE 150 MG/ML IM SUSP
150.0000 mg | Freq: Once | INTRAMUSCULAR | Status: AC
  Administered 2021-09-01: 150 mg via INTRAMUSCULAR

## 2021-09-25 ENCOUNTER — Other Ambulatory Visit: Payer: Self-pay | Admitting: Nurse Practitioner

## 2021-09-25 ENCOUNTER — Telehealth: Payer: Self-pay

## 2021-09-25 DIAGNOSIS — D508 Other iron deficiency anemias: Secondary | ICD-10-CM

## 2021-09-25 MED ORDER — FERROUS SULFATE 325 (65 FE) MG PO TABS: 325.0000 mg | ORAL_TABLET | Freq: Two times a day (BID) | ORAL | 2 refills | Status: DC

## 2021-09-25 NOTE — Telephone Encounter (Signed)
The refill has been sent. Thanks  ° °

## 2021-09-25 NOTE — Telephone Encounter (Signed)
Pt called and said that she has no more refills on her iron pills

## 2021-10-09 ENCOUNTER — Ambulatory Visit: Payer: Self-pay | Admitting: Internal Medicine

## 2021-10-09 ENCOUNTER — Other Ambulatory Visit: Payer: Self-pay

## 2021-10-16 ENCOUNTER — Inpatient Hospital Stay: Payer: Self-pay | Admitting: Internal Medicine

## 2021-10-16 ENCOUNTER — Inpatient Hospital Stay: Payer: Self-pay

## 2021-11-05 ENCOUNTER — Ambulatory Visit: Payer: Self-pay | Admitting: Endocrinology

## 2021-11-07 ENCOUNTER — Ambulatory Visit (INDEPENDENT_AMBULATORY_CARE_PROVIDER_SITE_OTHER): Payer: No Typology Code available for payment source | Admitting: Endocrinology

## 2021-11-07 ENCOUNTER — Other Ambulatory Visit: Payer: Self-pay

## 2021-11-07 ENCOUNTER — Encounter: Payer: Self-pay | Admitting: Endocrinology

## 2021-11-07 VITALS — BP 142/94 | HR 74 | Ht 69.0 in | Wt 230.0 lb

## 2021-11-07 DIAGNOSIS — E0590 Thyrotoxicosis, unspecified without thyrotoxic crisis or storm: Secondary | ICD-10-CM

## 2021-11-07 NOTE — Patient Instructions (Addendum)
Your blood pressure is high today.  Please see your primary care provider soon, to have it rechecked.   ?Blood tests are requested for you today.  We'll let you know about the results.   ?If ever you have fever while taking methimazole, stop it and call us, even if the reason is obvious, because of the risk of a rare side-effect.   ?It is best to never miss the methimazole.  However, if you do miss it, next best is to double up the next time.   ?Please come back for a follow-up appointment in 6 months.   ? ?

## 2021-11-07 NOTE — Progress Notes (Signed)
? ?Subjective:  ? ? Patient ID: Kellie Shaffer, female    DOB: 06/13/1991, 31 y.o.   MRN: [id number] ? ?HPI ?Pt returns for f/u of hyperthyroidism (dx'ed 2013, during a pregnancy; US showed a small multinodular goiter, but she also has proptosis; she was then rx'ed with tapazole; she was lost to f/u, but hyperthyroidism was found again during a hospitalization in late 2015 for appendicitis; she was restarted on tapazole then; pt says she is not at risk for another pregnancy; f/u TFT in 2018 were normal off rx; later in 2018, it was resumed due to suppressed TSH).   She says she never misses the methimazole.  pt states she feels well in general.  Specifically, she denies palpitations and tremor.   ?Past Medical History:  ?Diagnosis Date  ? Abnormal Pap smear of cervix 2019  ? LSIL  ? Abortion May 2013  ? Anemia   ? Angio-edema   ? Anxiety   ? Diabetes mellitus without complication ([facility])   ? Dizziness 11/22/2019  ? Hemoglobin A1c less than 7.0% 03/2020  ? History of ITP   ? History of palpitations   ? Cardiac event monitor June 2019: Normal.  Sinus rhythm no arrhythmias or significant PACs/PVCs.  ? Hypercholesteremia   ? Hyperglycemia 03/2020  ? Hypertension   ? Hyperthyroidism   ? Iron deficiency anemia 09/04/2016  ? Peripheral vertigo   ? Recurrent upper respiratory infection (URI)   ? Seasonal allergies 11/22/2019  ? Urticaria   ? Vertigo 11/22/2019  ? Vertigo   ? ? ?Past Surgical History:  ?Procedure Laterality Date  ? LAPAROSCOPIC APPENDECTOMY N/A 01/14/2017  ? Procedure: APPENDECTOMY LAPAROSCOPIC;  Surgeon: Claud Kelp, MD;  Location: [facility];  Service: General;  Laterality: N/A;  ? ? ?Social History  ? ?Socioeconomic History  ? Marital status: Single  ?  Spouse name: Not on file  ? Number of children: Not on file  ? Years of education: 78  ? Highest education level: Not on file  ?Occupational History  ? Occupation: [employer]  ?Tobacco Use  ? Smoking status: Former  ?  Packs/day: 0.25  ?  Years: 10.00   ?  Pack years: 2.50  ?  Types: Cigarettes  ?  Start date: 2011  ?  Quit date: 2021  ?  Years since quitting: 2.2  ? Smokeless tobacco: Never  ?Vaping Use  ? Vaping Use: Never used  ?Substance and Sexual Activity  ? Alcohol use: Yes  ?  Comment: 2 glasses of wine/month  ? Drug use: No  ? Sexual activity: Yes  ?  Partners: Male  ?  Birth control/protection: Condom, Injection  ?  Comment: Micronor  ?Other Topics Concern  ? Not on file  ?Social History Narrative  ? Regular exercise-yes  ? Caffeine Use-yes  ?   ?   ? ?Social Determinants of Health  ? ?Financial Resource Strain: Not on file  ?Food Insecurity: Not on file  ?Transportation Needs: Not on file  ?Physical Activity: Not on file  ?Stress: Not on file  ?Social Connections: Not on file  ?Intimate Partner Violence: Not on file  ? ? ?Current Outpatient Medications on File Prior to Visit  ?Medication Sig Dispense Refill  ? atorvastatin (LIPITOR) 40 MG tablet Take 1 tablet (40 mg total) by mouth daily. 90 tablet 3  ? Biotin 42706 MCG TABS Take 1,000 mcg by mouth daily.     ? busPIRone (BUSPAR) 10 MG tablet Take 1 tablet (10 mg total)  by mouth daily. 90 tablet 3  ? cetirizine (ZYRTEC) 10 MG tablet Take 1 tablet (10 mg total) by mouth daily as needed for allergies. 90 tablet 1  ? famotidine (PEPCID) 20 MG tablet Take 20 mg by mouth 2 (two) times daily.    ? ferrous sulfate 325 (65 FE) MG tablet Take 1 tablet (325 mg total) by mouth 2 (two) times daily with a meal. 60 tablet 2  ? glipiZIDE (GLUCOTROL) 10 MG tablet Take 1 tablet (10 mg total) by mouth 2 (two) times daily before a meal. 180 tablet 3  ? lisinopril (ZESTRIL) 5 MG tablet Take 1 tablet (5 mg total) by mouth daily. 90 tablet 3  ? metFORMIN (GLUCOPHAGE) 500 MG tablet Take 2 tablets (1,000 mg total) by mouth 2 (two) times daily with a meal. 120 tablet 11  ? methimazole (TAPAZOLE) 10 MG tablet Take 1 tablet (10 mg total) by mouth daily. 90 tablet 1  ? metoprolol tartrate (LOPRESSOR) 25 MG tablet TAKE ONE-HALF  (1/2) TABLET EVERY MORNING 90 tablet 3  ? Multiple Vitamins-Minerals (MULTIVITAMIN ADULT PO) Take by mouth daily.    ? omeprazole (PRILOSEC) 40 MG capsule Take 1 capsule (40 mg total) by mouth daily. 30 capsule 1  ? predniSONE (DELTASONE) 20 MG tablet 5 tablet p.o. daily for 2 weeks followed by 4 tablet p.o. daily for 1 week followed by 3 tablets p.o. daily for 1 week followed by 2 tablet p.o. daily for 1 week followed by 1 tablet p.o. daily for 1 week then half a tablet p.o. daily for 1 week. 144 tablet 0  ? Probiotic Product (PROBIOTIC-10 PO) Take by mouth. 1 caples    ? medroxyPROGESTERone (DEPO-PROVERA) 150 MG/ML injection Inject 1 mL (150 mg total) into the muscle once for 1 dose. Every 90 days. 1 mL 02  ? ?No current facility-administered medications on file prior to visit.  ? ? ?Allergies  ?Allergen Reactions  ? Benadryl [Diphenhydramine Hcl] Anaphylaxis and Hives  ? Chloroxine Hives  ?  Clorox  ? ? ?Family History  ?Problem Relation Age of Onset  ? Diabetes Other   ?     Parent  ? Hypertension Other   ?     Parent  ? Hyperlipidemia Other   ?     Parent  ? Arthritis Other   ?     Parent  ? Breast cancer Mother 57  ? ? ?BP (!) 142/94 (BP Location: Left Arm, Patient Position: Sitting, Cuff Size: Normal)   Pulse 74   Ht 5\' 9"  (1.753 m)   Wt 230 lb (104.3 kg)   SpO2 97%   BMI 33.97 kg/m?  ? ? ?Review of Systems ?Denies fever.   ?   ?Objective:  ? Physical Exam ?VITAL SIGNS:  See vs page.   ?GENERAL: no distress.   ?EYES: bilat proptosis.   ?NECK: thyroid is slightly and diffusely enlarged.    ? ? ?Lab Results  ?Component Value Date  ? TSH 5.55 (H) 11/07/2021  ? ?   ?Assessment & Plan:  ?Hyperthyroidism: overcontrolled.  I have sent a prescription to your pharmacy, to reduce the methimazole ?Please come back for a follow-up appointment in 3 months ? ?

## 2021-11-11 LAB — T4, FREE: Free T4: 0.82 ng/dL (ref 0.60–1.60)

## 2021-11-11 LAB — TSH: TSH: 5.55 u[IU]/mL — ABNORMAL HIGH (ref 0.35–5.50)

## 2021-11-11 MED ORDER — METHIMAZOLE 5 MG PO TABS
5.0000 mg | ORAL_TABLET | Freq: Every day | ORAL | 3 refills | Status: DC
Start: 2021-11-11 — End: 2022-10-23

## 2021-11-21 ENCOUNTER — Telehealth: Payer: Self-pay | Admitting: Internal Medicine

## 2021-11-21 NOTE — Telephone Encounter (Signed)
.  Called patient to schedule appointment per 4/6 inbasket, patient is aware of date and time.   ?

## 2021-11-24 ENCOUNTER — Other Ambulatory Visit: Payer: Self-pay | Admitting: Obstetrics and Gynecology

## 2021-11-24 DIAGNOSIS — Z3042 Encounter for surveillance of injectable contraceptive: Secondary | ICD-10-CM

## 2021-11-24 NOTE — Telephone Encounter (Signed)
BS pt.  ?Last AEX 01/09/21--nothing scheduled for 2023. ?Last injection 09/01/21--scheduled for 11/25/21.  ?

## 2021-11-25 ENCOUNTER — Ambulatory Visit (INDEPENDENT_AMBULATORY_CARE_PROVIDER_SITE_OTHER): Payer: No Typology Code available for payment source | Admitting: *Deleted

## 2021-11-25 DIAGNOSIS — Z3042 Encounter for surveillance of injectable contraceptive: Secondary | ICD-10-CM

## 2021-11-25 MED ORDER — MEDROXYPROGESTERONE ACETATE 150 MG/ML IM SUSP
150.0000 mg | Freq: Once | INTRAMUSCULAR | Status: AC
  Administered 2021-11-25: 150 mg via INTRAMUSCULAR

## 2021-11-25 NOTE — Progress Notes (Signed)
Next injection June 27 th - July 11 ?

## 2021-11-26 ENCOUNTER — Inpatient Hospital Stay: Payer: No Typology Code available for payment source | Attending: Internal Medicine

## 2021-11-26 ENCOUNTER — Inpatient Hospital Stay (HOSPITAL_BASED_OUTPATIENT_CLINIC_OR_DEPARTMENT_OTHER): Payer: No Typology Code available for payment source | Admitting: Internal Medicine

## 2021-11-26 ENCOUNTER — Other Ambulatory Visit: Payer: Self-pay

## 2021-11-26 VITALS — BP 147/82 | HR 84 | Temp 97.6°F | Resp 19 | Ht 69.0 in | Wt 231.3 lb

## 2021-11-26 DIAGNOSIS — E039 Hypothyroidism, unspecified: Secondary | ICD-10-CM | POA: Diagnosis not present

## 2021-11-26 DIAGNOSIS — D696 Thrombocytopenia, unspecified: Secondary | ICD-10-CM | POA: Diagnosis not present

## 2021-11-26 DIAGNOSIS — D693 Immune thrombocytopenic purpura: Secondary | ICD-10-CM | POA: Diagnosis present

## 2021-11-26 DIAGNOSIS — Z862 Personal history of diseases of the blood and blood-forming organs and certain disorders involving the immune mechanism: Secondary | ICD-10-CM

## 2021-11-26 LAB — CMP (CANCER CENTER ONLY)
ALT: 34 U/L (ref 0–44)
AST: 14 U/L — ABNORMAL LOW (ref 15–41)
Albumin: 4.3 g/dL (ref 3.5–5.0)
Alkaline Phosphatase: 62 U/L (ref 38–126)
Anion gap: 7 (ref 5–15)
BUN: 11 mg/dL (ref 6–20)
CO2: 25 mmol/L (ref 22–32)
Calcium: 9.6 mg/dL (ref 8.9–10.3)
Chloride: 106 mmol/L (ref 98–111)
Creatinine: 0.74 mg/dL (ref 0.44–1.00)
GFR, Estimated: >60 mL/min (ref >60)
Glucose, Bld: 197 mg/dL — ABNORMAL HIGH (ref 70–99)
Potassium: 3.7 mmol/L (ref 3.5–5.1)
Sodium: 138 mmol/L (ref 135–145)
Total Bilirubin: 0.7 mg/dL (ref 0.3–1.2)
Total Protein: 8 g/dL (ref 6.5–8.1)

## 2021-11-26 LAB — CBC WITH DIFFERENTIAL (CANCER CENTER ONLY)
Abs Immature Granulocytes: 0.02 10*3/uL (ref 0.00–0.07)
Basophils Absolute: 0 10*3/uL (ref 0.0–0.1)
Basophils Relative: 0 %
Eosinophils Absolute: 0.1 10*3/uL (ref 0.0–0.5)
Eosinophils Relative: 2 %
HCT: 37.8 % (ref 36.0–46.0)
Hemoglobin: 12.3 g/dL (ref 12.0–15.0)
Immature Granulocytes: 0 %
Lymphocytes Relative: 26 %
Lymphs Abs: 1.8 10*3/uL (ref 0.7–4.0)
MCH: 27.6 pg (ref 26.0–34.0)
MCHC: 32.5 g/dL (ref 30.0–36.0)
MCV: 84.9 fL (ref 80.0–100.0)
Monocytes Absolute: 0.5 10*3/uL (ref 0.1–1.0)
Monocytes Relative: 7 %
Neutro Abs: 4.7 10*3/uL (ref 1.7–7.7)
Neutrophils Relative %: 65 %
Platelet Count: 197 10*3/uL (ref 150–400)
RBC: 4.45 MIL/uL (ref 3.87–5.11)
RDW: 13.9 % (ref 11.5–15.5)
WBC Count: 7.2 10*3/uL (ref 4.0–10.5)
nRBC: 0 % (ref 0.0–0.2)

## 2021-11-26 LAB — LACTATE DEHYDROGENASE: LDH: 138 U/L (ref 98–192)

## 2021-11-26 NOTE — Progress Notes (Signed)
?    [facility] ?[phone]   [phone] ? ?OFFICE PROGRESS NOTE ? ?Barbette Merino, NP ?[address] ?[place] [address] ? 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 ?PRIOR THERAPY: She was treated in the past with a taper dose of prednisone. ? ?CURRENT THERAPY: Observation. ? ?INTERVAL HISTORY: ?Kellie Shaffer 31 y.o. female returns to the clinic today for follow-up visit.  The patient is feeling fine today with no concerning complaints.  She denied having any current chest pain, shortness of breath, cough or hemoptysis.  She has no bleeding, bruises or ecchymosis.  She denied having any nausea, vomiting, diarrhea or constipation.  She has no headache or visual changes.  She is followed by Dr. Everardo All for her hyperthyroidism.  She is here today for evaluation and repeat blood work. ? ? ?MEDICAL HISTORY: ?Past Medical History:  ?Diagnosis Date  ? Abnormal Pap smear of cervix 2019  ? LSIL  ? Abortion May 2013  ? Anemia   ? Angio-edema   ? Anxiety   ? Diabetes mellitus without complication (HCC)   ? Dizziness 11/22/2019  ? Hemoglobin A1c less than 7.0% 03/2020  ? History of ITP   ? History of palpitations   ? Cardiac event monitor June 2019: Normal.  Sinus rhythm no arrhythmias or significant PACs/PVCs.  ? Hypercholesteremia   ? Hyperglycemia 03/2020  ? Hypertension   ? Hyperthyroidism   ? Iron deficiency anemia 09/04/2016  ? Peripheral vertigo   ? Recurrent upper respiratory infection (URI)   ? Seasonal allergies 11/22/2019  ? Urticaria   ? Vertigo 11/22/2019  ? Vertigo   ? ? ?ALLERGIES:  is allergic to benadryl [diphenhydramine hcl] and chloroxine. ? ?MEDICATIONS:  ?Current Outpatient Medications  ?Medication Sig Dispense Refill  ? atorvastatin (LIPITOR) 40 MG tablet Take 1 tablet (40 mg total) by mouth daily. 90 tablet 3  ? Biotin 95621 MCG TABS Take 1,000 mcg by mouth daily.     ? busPIRone (BUSPAR) 10 MG tablet Take 1 tablet (10 mg  total) by mouth daily. 90 tablet 3  ? cetirizine (ZYRTEC) 10 MG tablet Take 1 tablet (10 mg total) by mouth daily as needed for allergies. 90 tablet 1  ? famotidine (PEPCID) 20 MG tablet Take 20 mg by mouth 2 (two) times daily.    ? ferrous sulfate 325 (65 FE) MG tablet Take 1 tablet (325 mg total) by mouth 2 (two) times daily with a meal. 60 tablet 2  ? glipiZIDE (GLUCOTROL) 10 MG tablet Take 1 tablet (10 mg total) by mouth 2 (two) times daily before a meal. 180 tablet 3  ? lisinopril (ZESTRIL) 5 MG tablet Take 1 tablet (5 mg total) by mouth daily. 90 tablet 3  ? medroxyPROGESTERone (DEPO-PROVERA) 150 MG/ML injection INJECT 1 ML INTO THE MUSCLE ONCE FOR 1 DOSE 1 mL 0  ? metFORMIN (GLUCOPHAGE) 500 MG tablet Take 2 tablets (1,000 mg total) by mouth 2 (two) times daily with a meal. 120 tablet 11  ? methimazole (TAPAZOLE) 5 MG tablet Take 1 tablet (5 mg total) by mouth daily. 90 tablet 3  ? metoprolol tartrate (LOPRESSOR) 25 MG tablet TAKE ONE-HALF (1/2) TABLET EVERY MORNING 90 tablet 3  ? Multiple Vitamins-Minerals (MULTIVITAMIN ADULT PO) Take by mouth daily.    ? omeprazole (PRILOSEC) 40 MG capsule Take 1 capsule (40 mg total) by mouth daily. 30 capsule 1  ? predniSONE (DELTASONE) 20 MG tablet 5 tablet p.o. daily  for 2 weeks followed by 4 tablet p.o. daily for 1 week followed by 3 tablets p.o. daily for 1 week followed by 2 tablet p.o. daily for 1 week followed by 1 tablet p.o. daily for 1 week then half a tablet p.o. daily for 1 week. 144 tablet 0  ? Probiotic Product (PROBIOTIC-10 PO) Take by mouth. 1 caples    ? ?No current facility-administered medications for this visit.  ? ? ?SURGICAL HISTORY:  ?Past Surgical History:  ?Procedure Laterality Date  ? LAPAROSCOPIC APPENDECTOMY N/A 01/14/2017  ? Procedure: APPENDECTOMY LAPAROSCOPIC;  Surgeon: Claud Kelp, MD;  Location: [facility];  Service: General;  Laterality: N/A;  ? ? ?REVIEW OF SYSTEMS:  A comprehensive review of systems was negative.  ? ?PHYSICAL EXAMINATION:  General appearance: alert, cooperative, appears stated age, and no distress ?Head: Normocephalic, without obvious abnormality, atraumatic ?Neck: no adenopathy, no JVD, supple, symmetrical, trachea midline, and thyroid not enlarged, symmetric, no tenderness/mass/nodules ?Lymph nodes: Cervical, supraclavicular, and axillary nodes normal. ?Resp: clear to auscultation bilaterally ?Back: symmetric, no curvature. ROM normal. No CVA tenderness. ?Cardio: regular rate and rhythm, S1, S2 normal, no murmur, click, rub or gallop ?GI: soft, non-tender; bowel sounds normal; no masses,  no organomegaly ?Extremities: extremities normal, atraumatic, no cyanosis or edema ? ?ECOG PERFORMANCE STATUS: 0 - Asymptomatic ? ?Blood pressure (!) 147/82, pulse 84, temperature 97.6 ?F (36.4 ?C), temperature source Tympanic, resp. rate 19, height 5\' 9"  (1.753 m), weight 231 lb 4.8 oz (104.9 kg), SpO2 100 %. ? ?LABORATORY DATA: ?Lab Results  ?Component Value Date  ? WBC 7.2 11/26/2021  ? HGB 12.3 11/26/2021  ? HCT 37.8 11/26/2021  ? MCV 84.9 11/26/2021  ? PLT 197 11/26/2021  ? ? ?  Chemistry   ?   ?Component Value Date/Time  ? NA 136 07/07/2021 0751  ? NA 140 10/02/2020 1132  ? NA 132 (L) 08/13/2017 1044  ? K 3.5 07/07/2021 0751  ? K 4.3 08/13/2017 1044  ? CL 104 07/07/2021 0751  ? CO2 24 07/07/2021 0751  ? CO2 25 08/13/2017 1044  ? BUN 30 (H) 07/07/2021 0751  ? BUN 10 10/02/2020 1132  ? BUN 18.6 08/13/2017 1044  ? CREATININE 0.76 07/07/2021 0751  ? CREATININE 1.3 (H) 08/13/2017 1044  ? GLU 517 (H) 08/13/2017 1257  ?    ?Component Value Date/Time  ? CALCIUM 9.3 07/07/2021 0751  ? CALCIUM 9.9 08/13/2017 1044  ? ALKPHOS 59 07/07/2021 0751  ? ALKPHOS 100 08/13/2017 1044  ? AST 12 (L) 07/07/2021 0751  ? AST 5 08/13/2017 1044  ? ALT 26 07/07/2021 0751  ? ALT 15 08/13/2017 1044  ? BILITOT 0.5 07/07/2021 0751  ? BILITOT 0.52 08/13/2017 1044  ?  ? ? ? ?RADIOGRAPHIC STUDIES: ?No results found. ? ?ASSESSMENT AND PLAN: This is a very pleasant 31 years old  African-American female with: ?1) idiopathic thrombocytopenic purpura: She was treated a few months ago with a taper dose of prednisone because of the significant thrombocytopenia.  ?The patient was treated in the past with steroids and has been doing fine. ?She has been off treatment for several months and she was also lost to follow-up. ?She was treated few months ago with a tapered dose of prednisone.  She is currently on observation. ?The patient is feeling fine today with no concerning complaints. ?Repeat CBC showed normal total white blood count, hemoglobin and hematocrit as well as platelet count. ?I recommended for her to continue on observation with repeat blood work in 6  months. ?For the hypothyroidism, she is followed by Dr. Everardo AllEllison with endocrinology. ?The patient was advised to call immediately if she has any other concerning symptoms in the interval. ?The patient voices understanding of current disease status and treatment options and is in agreement with the current care plan. ?All questions were answered. The patient knows to call the clinic with any problems, questions or concerns. We can certainly see the patient much sooner if necessary. ? ?Disclaimer: This note was dictated with voice recognition software. Similar sounding words can inadvertently be transcribed and may not be corrected upon review. ? ? ?  ?  ?

## 2021-12-03 ENCOUNTER — Telehealth: Payer: Self-pay

## 2021-12-03 NOTE — Telephone Encounter (Signed)
Omeprazole ?Famotidine ? ? ? ?

## 2021-12-04 NOTE — Telephone Encounter (Signed)
We didn't prescribe the patients Famotidine and Dr. Gwenyth Bouillon prescribed the Omeprazole She will have to contact who prescribed those two medications.   ?

## 2021-12-09 ENCOUNTER — Telehealth: Payer: Self-pay | Admitting: Medical Oncology

## 2021-12-09 ENCOUNTER — Other Ambulatory Visit: Payer: Self-pay | Admitting: Medical Oncology

## 2021-12-09 NOTE — Telephone Encounter (Signed)
Refill requested for Famotadine.  ?

## 2021-12-10 ENCOUNTER — Telehealth: Payer: Self-pay

## 2021-12-10 NOTE — Telephone Encounter (Signed)
Famotidine-LVM regarding refill for famotidine. Pt said pharmacy told her that [facility] prescribed it. I don't see that he did on her MAR. ? ?I asked pt  to return call to clarify if she takes both and only needs a refill for Famotidine .  ?

## 2021-12-10 NOTE — Telephone Encounter (Signed)
Patient has not been seen in 6 months and will need to an appt before refills are given. ?

## 2021-12-10 NOTE — Telephone Encounter (Signed)
Pt called trying to get a refill of her rx Famotidine. Pts PCP has left [facility] and she is completely out of her medication. I have called her previous PCPs office, and spoke with Nelva Bush to advise of this. Nelva Bush advised she will have one of their other physicians address this request. ?

## 2021-12-10 NOTE — Telephone Encounter (Signed)
Famotidine

## 2021-12-24 ENCOUNTER — Other Ambulatory Visit: Payer: Self-pay | Admitting: Internal Medicine

## 2022-01-19 ENCOUNTER — Other Ambulatory Visit: Payer: Self-pay

## 2022-01-19 ENCOUNTER — Telehealth: Payer: Self-pay

## 2022-01-19 DIAGNOSIS — D508 Other iron deficiency anemias: Secondary | ICD-10-CM

## 2022-01-19 MED ORDER — FERROUS SULFATE 325 (65 FE) MG PO TABS: 325.0000 mg | ORAL_TABLET | Freq: Two times a day (BID) | ORAL | 1 refills | Status: DC

## 2022-01-19 NOTE — Telephone Encounter (Signed)
Refill request has been sent to the provider for review. ?

## 2022-01-19 NOTE — Telephone Encounter (Signed)
Pt made an f/u appt and wanted to know if she can get her IRON pills to be refilled!

## 2022-01-20 ENCOUNTER — Other Ambulatory Visit: Payer: Self-pay | Admitting: Nurse Practitioner

## 2022-01-20 DIAGNOSIS — D508 Other iron deficiency anemias: Secondary | ICD-10-CM

## 2022-01-20 MED ORDER — FERROUS SULFATE 325 (65 FE) MG PO TABS: 325.0000 mg | ORAL_TABLET | Freq: Two times a day (BID) | ORAL | 1 refills | Status: DC

## 2022-01-21 ENCOUNTER — Other Ambulatory Visit: Payer: Self-pay | Admitting: Internal Medicine

## 2022-01-21 ENCOUNTER — Telehealth: Payer: Self-pay

## 2022-01-21 ENCOUNTER — Other Ambulatory Visit: Payer: Self-pay

## 2022-01-21 MED ORDER — OMEPRAZOLE 40 MG PO CPDR: 40.0000 mg | DELAYED_RELEASE_CAPSULE | Freq: Every day | ORAL | 1 refills | Status: AC

## 2022-01-21 NOTE — Telephone Encounter (Signed)
Pt called requesting refill on Omeprazole. Prescription e-scribed. Left VM for patient stating that refill had been sent.

## 2022-02-02 ENCOUNTER — Ambulatory Visit: Payer: No Typology Code available for payment source | Admitting: Nurse Practitioner

## 2022-02-04 ENCOUNTER — Other Ambulatory Visit: Payer: Self-pay | Admitting: Obstetrics and Gynecology

## 2022-02-04 DIAGNOSIS — Z3042 Encounter for surveillance of injectable contraceptive: Secondary | ICD-10-CM

## 2022-02-04 NOTE — Telephone Encounter (Signed)
Last AEX 01/09/21-nothing scheduled. Last depo injection 11/25/21--next injection due 6/27-7/11. Pt scheduled for injection on 6/23  Sent pt mychart msg to notify her that she is overdue for annual and also to let her know that she is scheduled a few days too early for her depo injection and to see if I can have our scheduling dept reach out to her to fix. Will wait for response.

## 2022-02-05 NOTE — Telephone Encounter (Signed)
Pt is now scheduled for AEX on 02/24/22 with you.   And r/s tomorrow's injection visit to 02/10/22.

## 2022-02-05 NOTE — Telephone Encounter (Signed)
Spoke w/ pt. Refer to mychart encounter 02/04/22.  She plans to r/s depo injection and schedule AEX.

## 2022-02-05 NOTE — Telephone Encounter (Signed)
FYI.  Pt had not seen the mychart msg and called today inquiring about appt tomorrow for injection and no refill of her depo at the pharmacy.   I returned the phone call and explained to pt as to why the refill was not there. She voiced understanding.   Will send msg to scheduling to schedule next available slot for AEX and Depo on 02/10/22.   Once scheduled we can approve refill.

## 2022-02-06 ENCOUNTER — Ambulatory Visit: Payer: No Typology Code available for payment source

## 2022-02-10 ENCOUNTER — Ambulatory Visit (INDEPENDENT_AMBULATORY_CARE_PROVIDER_SITE_OTHER): Payer: No Typology Code available for payment source

## 2022-02-10 VITALS — BP 122/70

## 2022-02-10 DIAGNOSIS — Z3042 Encounter for surveillance of injectable contraceptive: Secondary | ICD-10-CM

## 2022-02-10 MED ORDER — MEDROXYPROGESTERONE ACETATE 150 MG/ML IM SUSP
150.0000 mg | Freq: Once | INTRAMUSCULAR | Status: AC
  Administered 2022-02-10: 150 mg via INTRAMUSCULAR

## 2022-02-12 ENCOUNTER — Encounter: Payer: Self-pay | Admitting: Nurse Practitioner

## 2022-02-12 ENCOUNTER — Ambulatory Visit (INDEPENDENT_AMBULATORY_CARE_PROVIDER_SITE_OTHER): Payer: No Typology Code available for payment source | Admitting: Nurse Practitioner

## 2022-02-12 VITALS — BP 130/75 | HR 78 | Resp 18

## 2022-02-12 DIAGNOSIS — I10 Essential (primary) hypertension: Secondary | ICD-10-CM

## 2022-02-12 DIAGNOSIS — D508 Other iron deficiency anemias: Secondary | ICD-10-CM

## 2022-02-12 MED ORDER — LISINOPRIL 5 MG PO TABS: 5.0000 mg | ORAL_TABLET | Freq: Every day | ORAL | 3 refills | Status: DC

## 2022-02-12 MED ORDER — ATORVASTATIN CALCIUM 40 MG PO TABS: 40.0000 mg | ORAL_TABLET | Freq: Every day | ORAL | 3 refills | Status: DC

## 2022-02-12 MED ORDER — FERROUS SULFATE 325 (65 FE) MG PO TABS: 325.0000 mg | ORAL_TABLET | Freq: Two times a day (BID) | ORAL | 1 refills | Status: DC

## 2022-02-12 NOTE — Assessment & Plan Note (Signed)
-   atorvastatin (LIPITOR) 40 MG tablet; Take 1 tablet (40 mg total) by mouth daily.  Dispense: 90 tablet; Refill: 3 - lisinopril (ZESTRIL) 5 MG tablet; Take 1 tablet (5 mg total) by mouth daily.  Dispense: 90 tablet; Refill: 3  Low salt diet  Stay active    Follow up:  Follow up in 6 months or sooner if needed

## 2022-02-12 NOTE — Patient Instructions (Addendum)
1. Hypertension, unspecified type  - atorvastatin (LIPITOR) 40 MG tablet; Take 1 tablet (40 mg total) by mouth daily.  Dispense: 90 tablet; Refill: 3 - lisinopril (ZESTRIL) 5 MG tablet; Take 1 tablet (5 mg total) by mouth daily.  Dispense: 90 tablet; Refill: 3  Low salt diet  Stay active    Follow up:  Follow up in 6 months or sooner if needed

## 2022-02-12 NOTE — Progress Notes (Signed)
@Patient  ID: , female    DOB: 1990/08/22, 31 y.o.   MRN: [id number]  Chief Complaint  Patient presents with   Follow-up    Referring provider: [id number], NP  HPI  Kellie Shaffer presents for follow up.  She  has a past medical history of Abnormal Pap smear of cervix (2019), Abortion (May 2013), Anemia, Angio-edema, Anxiety, Diabetes mellitus without complication (HCC), Dizziness (11/22/2019), Hemoglobin A1c less than 7.0% (03/2020), History of ITP, History of palpitations, Hypercholesteremia, Hyperglycemia (03/2020), Hypertension, Hyperthyroidism, Iron deficiency anemia (09/04/2016), Peripheral vertigo, Recurrent upper respiratory infection (URI), Seasonal allergies (11/22/2019), Urticaria, Vertigo (11/22/2019), and Vertigo.  Patient presents today for follow-up visit.  Overall she has been doing well with no new issues or concerns.  Patient is followed by hematology for anemia.  She recently saw them in April and had blood work completed.  Patient is followed by endocrinology for thyroid disorder.  Patient does have an established OB/GYN.      Allergies  Allergen Reactions   Benadryl [Diphenhydramine Hcl] Anaphylaxis and Hives   Chloroxine Hives    Clorox    Immunization History  Administered Date(s) Administered   Influenza,inj,Quad PF,6+ Mos 10/04/2018, 06/21/2019   Pneumococcal-Unspecified 08/17/2009   Tdap 08/17/2010    Past Medical History:  Diagnosis Date   Abnormal Pap smear of cervix 2019   LSIL   Abortion May 2013   Anemia    Angio-edema    Anxiety    Diabetes mellitus without complication (HCC)    Dizziness 11/22/2019   Hemoglobin A1c less than 7.0% 03/2020   History of ITP    History of palpitations    Cardiac event monitor June 2019: Normal.  Sinus rhythm no arrhythmias or significant PACs/PVCs.   Hypercholesteremia    Hyperglycemia 03/2020   Hypertension    Hyperthyroidism    Iron deficiency anemia 09/04/2016    Peripheral vertigo    Recurrent upper respiratory infection (URI)    Seasonal allergies 11/22/2019   Urticaria    Vertigo 11/22/2019   Vertigo     Tobacco History: Social History   Tobacco Use  Smoking Status Former   Packs/day: 0.25   Years: 10.00   Total pack years: 2.50   Types: Cigarettes   Start date: 2011   Quit date: 2021   Years since quitting: 2.4  Smokeless Tobacco Never   Counseling given: Not Answered   Outpatient Encounter Medications as of 02/12/2022  Medication Sig   atorvastatin (LIPITOR) 40 MG tablet Take 1 tablet (40 mg total) by mouth daily.   Biotin 02/14/2022 MCG TABS Take 1,000 mcg by mouth daily.    busPIRone (BUSPAR) 10 MG tablet Take 1 tablet (10 mg total) by mouth daily.   cetirizine (ZYRTEC) 10 MG tablet Take 1 tablet (10 mg total) by mouth daily as needed for allergies.   famotidine (PEPCID) 20 MG tablet Take 20 mg by mouth 2 (two) times daily.   ferrous sulfate 325 (65 FE) MG tablet Take 1 tablet (325 mg total) by mouth 2 (two) times daily with a meal.   lisinopril (ZESTRIL) 5 MG tablet Take 1 tablet (5 mg total) by mouth daily.   medroxyPROGESTERone (DEPO-PROVERA) 150 MG/ML injection ADMINISTER 1 ML IN THE MUSCLE 1 TIME FOR 1 DOSE   methimazole (TAPAZOLE) 5 MG tablet Take 1 tablet (5 mg total) by mouth daily.   metoprolol tartrate (LOPRESSOR) 25 MG tablet TAKE ONE-HALF (1/2) TABLET EVERY MORNING   Multiple Vitamins-Minerals (MULTIVITAMIN ADULT PO)  Take by mouth daily.   omeprazole (PRILOSEC) 40 MG capsule Take 1 capsule (40 mg total) by mouth daily.   OVER THE COUNTER MEDICATION Beet Root supplement   Probiotic Product (PROBIOTIC-10 PO) Take by mouth. 1 caples   [DISCONTINUED] atorvastatin (LIPITOR) 40 MG tablet Take 1 tablet (40 mg total) by mouth daily.   [DISCONTINUED] lisinopril (ZESTRIL) 5 MG tablet Take 1 tablet (5 mg total) by mouth daily.   No facility-administered encounter medications on file as of 02/12/2022.     Review of  Systems  Review of Systems  Constitutional: Negative.   HENT: Negative.    Cardiovascular: Negative.   Gastrointestinal: Negative.   Allergic/Immunologic: Negative.   Neurological: Negative.   Psychiatric/Behavioral: Negative.         Physical Exam  BP 130/75   Pulse 78   Resp 18   Wt Readings from Last 5 Encounters:  11/26/21 231 lb 4.8 oz (104.9 kg)  11/07/21 230 lb (104.3 kg)  07/07/21 221 lb (100.2 kg)  06/11/21 220 lb 12.8 oz (100.2 kg)  05/27/21 227 lb 1.6 oz (103 kg)     Physical Exam Vitals and nursing note reviewed.  Constitutional:      General: She is not in acute distress.    Appearance: She is well-developed.  Cardiovascular:     Rate and Rhythm: Normal rate and regular rhythm.  Pulmonary:     Effort: Pulmonary effort is normal.     Breath sounds: Normal breath sounds.  Neurological:     Mental Status: She is alert and oriented to person, place, and time.      Lab Results:  CBC    Component Value Date/Time   WBC 7.2 11/26/2021 0805   WBC 6.7 05/06/2021 1020   RBC 4.45 11/26/2021 0805   HGB 12.3 11/26/2021 0805   HGB 12.2 10/02/2020 1132   HGB 12.1 08/13/2017 1044   HCT 37.8 11/26/2021 0805   HCT 36.0 10/02/2020 1132   HCT 37.2 08/13/2017 1044   PLT 197 11/26/2021 0805   PLT 133 (L) 10/02/2020 1132   MCV 84.9 11/26/2021 0805   MCV 82 10/02/2020 1132   MCV 81.6 08/13/2017 1044   MCH 27.6 11/26/2021 0805   MCHC 32.5 11/26/2021 0805   RDW 13.9 11/26/2021 0805   RDW 12.5 10/02/2020 1132   RDW 15.0 (H) 08/13/2017 1044   LYMPHSABS 1.8 11/26/2021 0805   LYMPHSABS 2.0 10/02/2020 1132   LYMPHSABS 3.0 08/13/2017 1044   MONOABS 0.5 11/26/2021 0805   MONOABS 0.8 08/13/2017 1044   EOSABS 0.1 11/26/2021 0805   EOSABS 0.2 10/02/2020 1132   BASOSABS 0.0 11/26/2021 0805   BASOSABS 0.0 10/02/2020 1132   BASOSABS 0.0 08/13/2017 1044    BMET    Component Value Date/Time   NA 138 11/26/2021 0805   NA 140 10/02/2020 1132   NA 132 (L)  08/13/2017 1044   K 3.7 11/26/2021 0805   K 4.3 08/13/2017 1044   CL 106 11/26/2021 0805   CO2 25 11/26/2021 0805   CO2 25 08/13/2017 1044   GLUCOSE 197 (H) 11/26/2021 0805   GLUCOSE 672 (HH) 08/13/2017 1044   BUN 11 11/26/2021 0805   BUN 10 10/02/2020 1132   BUN 18.6 08/13/2017 1044   CREATININE 0.74 11/26/2021 0805   CREATININE 1.3 (H) 08/13/2017 1044   CALCIUM 9.6 11/26/2021 0805   CALCIUM 9.9 08/13/2017 1044   GFRNONAA >60 11/26/2021 0805   GFRAA 124 10/02/2020 1132   GFRAA >60 12/20/2019 1020  BNP No results found for: "BNP"  ProBNP No results found for: "PROBNP"  Imaging: No results found.   Assessment & Plan:   Hypertension - atorvastatin (LIPITOR) 40 MG tablet; Take 1 tablet (40 mg total) by mouth daily.  Dispense: 90 tablet; Refill: 3 - lisinopril (ZESTRIL) 5 MG tablet; Take 1 tablet (5 mg total) by mouth daily.  Dispense: 90 tablet; Refill: 3  Low salt diet  Stay active    Follow up:  Follow up in 6 months or sooner if needed     [name], NP 02/12/2022

## 2022-02-24 ENCOUNTER — Other Ambulatory Visit (HOSPITAL_COMMUNITY)
Admission: RE | Admit: 2022-02-24 | Discharge: 2022-02-24 | Disposition: A | Discharge status: Home / Self Care | Payer: No Typology Code available for payment source | Source: Ambulatory Visit | Attending: Radiology | Admitting: Radiology

## 2022-02-24 ENCOUNTER — Ambulatory Visit (INDEPENDENT_AMBULATORY_CARE_PROVIDER_SITE_OTHER): Payer: Managed Care, Other (non HMO) | Admitting: Radiology

## 2022-02-24 ENCOUNTER — Encounter: Payer: Self-pay | Admitting: Radiology

## 2022-02-24 VITALS — BP 138/78 | Ht 69.0 in | Wt 222.0 lb

## 2022-02-24 DIAGNOSIS — Z113 Encounter for screening for infections with a predominantly sexual mode of transmission: Secondary | ICD-10-CM | POA: Diagnosis not present

## 2022-02-24 DIAGNOSIS — Z01419 Encounter for gynecological examination (general) (routine) without abnormal findings: Secondary | ICD-10-CM

## 2022-02-24 DIAGNOSIS — Z3042 Encounter for surveillance of injectable contraceptive: Secondary | ICD-10-CM | POA: Diagnosis not present

## 2022-02-24 NOTE — Progress Notes (Signed)
   Kellie Shaffer 1991/01/13 [id number]   History:  31 y.o. G1P0 presents for annual exam. Happy with depo for contraception. No GYN concerns, would like STI screen with pap. 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 Patient has had an injection.   Contraception/Family planning: Depo-Provera injections Sexually active: yes Last Pap: 2022. Results were: normal, hx of CIN III   Obstetric History OB History  Gravida Para Term Preterm AB Living  1       1    SAB IAB Ectopic Multiple Live Births    1          # Outcome Date GA Lbr Len/2nd Weight Sex Delivery Anes PTL Lv  1 IAB 2013             The following portions of the patient's history were reviewed and updated as appropriate: allergies, current medications, past family history, past medical history, past social history, past surgical history, and problem list.  Review of Systems Pertinent items noted in HPI and remainder of comprehensive ROS otherwise negative.   Past medical history, past surgical history, family history and social history were all reviewed and documented in the EPIC chart.   Exam:  Vitals:   02/24/22 0812  BP: 138/78  Weight: 222 lb (100.7 kg)  Height: 5\' 9"  (1.753 m)   Body mass index is 32.78 kg/m.  General appearance:  Normal Thyroid:  Symmetrical, normal in size, without palpable masses or nodularity. Respiratory  Auscultation:  Clear without wheezing or rhonchi Cardiovascular  Auscultation:  Regular rate, without rubs, murmurs or gallops  Edema/varicosities:  Not grossly evident Abdominal  Soft,nontender, without masses, guarding or rebound.  Liver/spleen:  No organomegaly noted  Hernia:  None appreciated  Skin  Inspection:  Grossly normal Breasts: Examined lying and sitting.   Right: Without masses, retractions, nipple discharge or axillary adenopathy.   Left: Without masses, retractions, nipple discharge or axillary adenopathy. Genitourinary    Inguinal/mons:  Normal without inguinal adenopathy  External genitalia:  Normal appearing vulva with no masses, tenderness, or lesions  BUS/Urethra/Skene's glands:  Normal without masses or exudate  Vagina:  Normal appearing with normal color and discharge, no lesions  Cervix:  Normal appearing without discharge or lesions  Uterus:  Normal in size, shape and contour.  Mobile, nontender  Adnexa/parametria:     Rt: Normal in size, without masses or tenderness.   Lt: Normal in size, without masses or tenderness.  Anus and perineum: Normal   Patient informed chaperone available to be present for breast and pelvic exam. Patient has requested no chaperone to be present. Patient has been advised what will be completed during breast and pelvic exam.   Assessment/Plan:   1. Well female exam with routine gynecological exam  - Cytology - PAP( [place])  2. Screening for STDs (sexually transmitted diseases)  - Cytology - PAP( [place])  3. Depo-Provera contraceptive status Due for next injection 04/28/22-05/12/22   Discussed SBE, pap and STI screening as directed/appropriate. Recommend 05/14/22 of exercise weekly, including weight bearing exercise. Encouraged the use of seatbelts and sunscreen. Return in 1 year for annual or as needed.   B WHNP-BC 8:30 AM 02/24/2022

## 2022-02-26 LAB — CYTOLOGY - PAP
Chlamydia: NEGATIVE
Comment: NEGATIVE
Comment: NEGATIVE
Comment: NEGATIVE
Comment: NEGATIVE
Comment: NEGATIVE
Comment: NORMAL
Diagnosis: UNDETERMINED — AB
HPV 16: NEGATIVE
HPV 18 / 45: NEGATIVE
High risk HPV: POSITIVE — AB
Neisseria Gonorrhea: NEGATIVE
Trichomonas: NEGATIVE

## 2022-03-22 ENCOUNTER — Other Ambulatory Visit: Payer: Self-pay

## 2022-03-22 ENCOUNTER — Encounter (HOSPITAL_BASED_OUTPATIENT_CLINIC_OR_DEPARTMENT_OTHER): Payer: Self-pay | Admitting: Emergency Medicine

## 2022-03-22 ENCOUNTER — Emergency Department (HOSPITAL_BASED_OUTPATIENT_CLINIC_OR_DEPARTMENT_OTHER)
Admission: EM | Admit: 2022-03-22 | Discharge: 2022-03-22 | Disposition: A | Discharge status: Home / Self Care | Payer: No Typology Code available for payment source | Attending: Emergency Medicine | Admitting: Emergency Medicine

## 2022-03-22 DIAGNOSIS — L0291 Cutaneous abscess, unspecified: Secondary | ICD-10-CM

## 2022-03-22 DIAGNOSIS — N764 Abscess of vulva: Secondary | ICD-10-CM | POA: Insufficient documentation

## 2022-03-22 MED ORDER — DOXYCYCLINE HYCLATE 100 MG PO CAPS: 100.0000 mg | ORAL_CAPSULE | Freq: Two times a day (BID) | ORAL | 0 refills | Status: DC

## 2022-03-22 NOTE — ED Triage Notes (Signed)
Pt arrive pov, steady gait, c/o abscess on LT buttock x 4 days. Also reports swelling on LT side groin.

## 2022-03-22 NOTE — ED Provider Notes (Signed)
[facility] EMERGENCY DEPARTMENT Provider Note   CSN: [id number] Arrival date & time: 03/22/22  1656     History  Chief Complaint  Patient presents with   Abscess    Kellie Shaffer is a 31 y.o. female presenting with a concern of an abscess.  Reports for the past 3 days she has had a "bump" just to the left of the vaginal introitus.  Says that she has had a cyst drained there before.  No fevers, chills but did note some drainage yesterday after showering.   Abscess      Home Medications Prior to Admission medications   Medication Sig Start Date End Date Taking? Authorizing Provider  doxycycline (VIBRAMYCIN) 100 MG capsule Take 1 capsule (100 mg total) by mouth 2 (two) times daily. 03/22/22  Yes Clotine Heiner A, PA-C  atorvastatin (LIPITOR) 40 MG tablet Take 1 tablet (40 mg total) by mouth daily. 02/12/22 02/12/23  [name], NP  Biotin 39767 MCG TABS Take 1,000 mcg by mouth daily.     [provider]  busPIRone (BUSPAR) 10 MG tablet Take 1 tablet (10 mg total) by mouth daily. 05/22/21   Barbette Merino, NP  cetirizine (ZYRTEC) 10 MG tablet Take 1 tablet (10 mg total) by mouth daily as needed for allergies. 08/21/20   Marcelyn Bruins, MD  CINNAMON PO Take by mouth.    [provider]  famotidine (PEPCID) 20 MG tablet Take 20 mg by mouth 2 (two) times daily. Patient not taking: Reported on 02/24/2022 05/27/20   [provider]  ferrous sulfate 325 (65 FE) MG tablet Take 1 tablet (325 mg total) by mouth 2 (two) times daily with a meal. 02/12/22 04/13/22  [name], NP  Ginger, Zingiber officinalis, (GINGER PO) Take by mouth.    [provider]  lisinopril (ZESTRIL) 5 MG tablet Take 1 tablet (5 mg total) by mouth daily. 02/12/22   [name], NP  medroxyPROGESTERone (DEPO-PROVERA) 150 MG/ML injection ADMINISTER 1 ML IN THE MUSCLE 1 TIME FOR 1 DOSE 02/06/22   Chrzanowski, Jami B, NP  methimazole (TAPAZOLE) 5 MG  tablet Take 1 tablet (5 mg total) by mouth daily. 11/11/21   Romero Belling, MD  metoprolol tartrate (LOPRESSOR) 25 MG tablet TAKE ONE-HALF (1/2) TABLET EVERY MORNING 07/22/21   Barbette Merino, NP  Multiple Vitamins-Minerals (MULTIVITAMIN ADULT PO) Take by mouth daily.    [provider]  omeprazole (PRILOSEC) 40 MG capsule Take 1 capsule (40 mg total) by mouth daily. 01/21/22   Si Gaul, MD  OVER THE COUNTER MEDICATION Beet Root supplement    [provider]  POTASSIUM PO Take by mouth.    [provider]  Probiotic Product (PROBIOTIC-10 PO) Take by mouth. 1 caples    [provider]      Allergies    Benadryl [diphenhydramine hcl] and Chloroxine    Review of Systems   Review of Systems  Physical Exam Updated Vital Signs BP (!) 144/71 (BP Location: Left Arm)   Pulse 88   Temp 98.5 F (36.9 C) (Oral)   Resp 20   Wt 102.1 kg   SpO2 100%   BMI 33.23 kg/m  Physical Exam Vitals and nursing note reviewed.  Constitutional:      Appearance: Normal appearance.  HENT:     Head: Normocephalic and atraumatic.  Eyes:     General: No scleral icterus.    Conjunctiva/sclera: Conjunctivae normal.  Pulmonary:  Effort: Pulmonary effort is normal. No respiratory distress.  Genitourinary:    Comments: Induration noted along the left labia majora.  0.5 cm area of skin breakdown and mild drainage.  No fluctuance. Skin:    Findings: No rash.  Neurological:     Mental Status: She is alert.  Psychiatric:        Mood and Affect: Mood normal.     ED Results / Procedures / Treatments   Labs (all labs ordered are listed, but only abnormal results are displayed) Labs Reviewed - No data to display  EKG None  Radiology No results found.  Procedures Procedures   Medications Ordered in ED Medications - No data to display  ED Course/ Medical Decision Making/ A&P                           Medical Decision Making Risk Prescription drug  management.   31 year old female presenting with a concern for a genital abscess.  Differential included labial abscess, Bartholin gland cyst, and necrotic infection.  She reports having to have an I&D in the same area in the past.  Physical exam is unremarkable very low suspicion necrotizing fasciitis.  Normal vital signs as well..  There does not appear to be an area that I am able to drain today.  We will treat her with antibiotics.  She is agreeable to this plan.  Return precautions discussed.  Final Clinical Impression(s) / ED Diagnoses Final diagnoses:  Abscess    Rx / DC Orders ED Discharge Orders          Ordered    doxycycline (VIBRAMYCIN) 100 MG capsule  2 times daily        03/22/22 1759          Results and diagnoses were explained to the patient. Return precautions discussed in full. Patient had no additional questions and expressed complete understanding.   This chart was dictated using voice recognition software.  Despite best efforts to proofread,  errors can occur which can change the documentation meaning.     Saddie Benders, PA-C 03/22/22 1839    Derwood Kaplan, MD 03/22/22 2132

## 2022-03-22 NOTE — Discharge Instructions (Addendum)
Doxycycline is the antibiotic that was sent to your pharmacy.  Take this as prescribed.  It may upset your stomach so take it with food.  For any cost concerns, use good Rx to find the cheapest place to pick up this antibiotic.  Per my review, this should be no more than $22.  Return with any fevers, chills or worsening symptoms.

## 2022-04-27 ENCOUNTER — Other Ambulatory Visit: Payer: Self-pay | Admitting: Radiology

## 2022-04-27 DIAGNOSIS — Z3042 Encounter for surveillance of injectable contraceptive: Secondary | ICD-10-CM

## 2022-04-27 NOTE — Telephone Encounter (Signed)
Patient called in voice mail. Has appt Wed 9.13.23 for Depo injection and just discovered no refill available. Requesting refill.  AEX 02/24/2022

## 2022-04-29 ENCOUNTER — Other Ambulatory Visit: Payer: Self-pay | Admitting: Nurse Practitioner

## 2022-04-29 ENCOUNTER — Ambulatory Visit (INDEPENDENT_AMBULATORY_CARE_PROVIDER_SITE_OTHER): Payer: Managed Care, Other (non HMO) | Admitting: *Deleted

## 2022-04-29 DIAGNOSIS — D508 Other iron deficiency anemias: Secondary | ICD-10-CM

## 2022-04-29 DIAGNOSIS — Z3042 Encounter for surveillance of injectable contraceptive: Secondary | ICD-10-CM

## 2022-04-29 MED ORDER — MEDROXYPROGESTERONE ACETATE 150 MG/ML IM SUSP
150.0000 mg | Freq: Once | INTRAMUSCULAR | Status: AC
  Administered 2022-04-29: 150 mg via INTRAMUSCULAR

## 2022-05-04 ENCOUNTER — Ambulatory Visit: Payer: No Typology Code available for payment source | Admitting: Internal Medicine

## 2022-05-27 ENCOUNTER — Other Ambulatory Visit: Payer: No Typology Code available for payment source

## 2022-05-27 ENCOUNTER — Encounter: Payer: Self-pay | Admitting: Internal Medicine

## 2022-05-27 ENCOUNTER — Inpatient Hospital Stay: Payer: Managed Care, Other (non HMO) | Attending: Internal Medicine

## 2022-05-27 ENCOUNTER — Ambulatory Visit: Payer: No Typology Code available for payment source | Admitting: Internal Medicine

## 2022-05-27 ENCOUNTER — Inpatient Hospital Stay (HOSPITAL_BASED_OUTPATIENT_CLINIC_OR_DEPARTMENT_OTHER): Payer: Managed Care, Other (non HMO) | Admitting: Internal Medicine

## 2022-05-27 ENCOUNTER — Other Ambulatory Visit: Payer: Self-pay

## 2022-05-27 VITALS — BP 155/82 | HR 72 | Temp 97.8°F | Resp 18 | Wt 214.4 lb

## 2022-05-27 DIAGNOSIS — Z79899 Other long term (current) drug therapy: Secondary | ICD-10-CM | POA: Insufficient documentation

## 2022-05-27 DIAGNOSIS — E0590 Thyrotoxicosis, unspecified without thyrotoxic crisis or storm: Secondary | ICD-10-CM | POA: Diagnosis not present

## 2022-05-27 DIAGNOSIS — D693 Immune thrombocytopenic purpura: Secondary | ICD-10-CM | POA: Insufficient documentation

## 2022-05-27 DIAGNOSIS — D696 Thrombocytopenia, unspecified: Secondary | ICD-10-CM

## 2022-05-27 LAB — CBC WITH DIFFERENTIAL (CANCER CENTER ONLY)
Abs Immature Granulocytes: 0.02 10*3/uL (ref 0.00–0.07)
Basophils Absolute: 0 10*3/uL (ref 0.0–0.1)
Basophils Relative: 0 %
Eosinophils Absolute: 0.2 10*3/uL (ref 0.0–0.5)
Eosinophils Relative: 2 %
HCT: 36.1 % (ref 36.0–46.0)
Hemoglobin: 12 g/dL (ref 12.0–15.0)
Immature Granulocytes: 0 %
Lymphocytes Relative: 35 %
Lymphs Abs: 2.4 10*3/uL (ref 0.7–4.0)
MCH: 28.3 pg (ref 26.0–34.0)
MCHC: 33.2 g/dL (ref 30.0–36.0)
MCV: 85.1 fL (ref 80.0–100.0)
Monocytes Absolute: 0.7 10*3/uL (ref 0.1–1.0)
Monocytes Relative: 11 %
Neutro Abs: 3.5 10*3/uL (ref 1.7–7.7)
Neutrophils Relative %: 52 %
Platelet Count: 192 10*3/uL (ref 150–400)
RBC: 4.24 MIL/uL (ref 3.87–5.11)
RDW: 13.2 % (ref 11.5–15.5)
WBC Count: 6.8 10*3/uL (ref 4.0–10.5)
nRBC: 0 % (ref 0.0–0.2)

## 2022-05-27 LAB — CMP (CANCER CENTER ONLY)
ALT: 32 U/L (ref 0–44)
AST: 15 U/L (ref 15–41)
Albumin: 4.5 g/dL (ref 3.5–5.0)
Alkaline Phosphatase: 56 U/L (ref 38–126)
Anion gap: 7 (ref 5–15)
BUN: 14 mg/dL (ref 6–20)
CO2: 25 mmol/L (ref 22–32)
Calcium: 9.5 mg/dL (ref 8.9–10.3)
Chloride: 107 mmol/L (ref 98–111)
Creatinine: 0.73 mg/dL (ref 0.44–1.00)
GFR, Estimated: >60 mL/min (ref >60)
Glucose, Bld: 160 mg/dL — ABNORMAL HIGH (ref 70–99)
Potassium: 3.8 mmol/L (ref 3.5–5.1)
Sodium: 139 mmol/L (ref 135–145)
Total Bilirubin: 0.7 mg/dL (ref 0.3–1.2)
Total Protein: 7.8 g/dL (ref 6.5–8.1)

## 2022-05-27 LAB — LACTATE DEHYDROGENASE: LDH: 162 U/L (ref 98–192)

## 2022-05-27 NOTE — Progress Notes (Signed)
[facility] [phone]   [phone]  OFFICE PROGRESS NOTE  [name], NP 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  CURRENT THERAPY: Observation.  INTERVAL HISTORY: Kellie Shaffer 31 y.o. female returns to the clinic today for 6 months follow-up visit.  The patient is feeling fine today with no concerning complaints.  She has no chest pain, shortness of breath, cough or hemoptysis.  She has no nausea, vomiting, diarrhea or constipation.  She has no recent weight loss or night sweats.  She has no bruises, ecchymosis or bleeding.  She is currently on methimazole for her hyperthyroidism.  She is supposed to see endocrinology in December for reevaluation.  The patient is here today for evaluation and repeat blood work.  MEDICAL HISTORY: Past Medical History:  Diagnosis Date   Abnormal Pap smear of cervix 2019   LSIL   Abortion May 2013   Anemia    Angio-edema    Anxiety    Diabetes mellitus without complication (HCC)    Dizziness 11/22/2019   Hemoglobin A1c less than 7.0% 03/2020   History of ITP    History of palpitations    Cardiac event monitor June 2019: Normal.  Sinus rhythm no arrhythmias or significant PACs/PVCs.   Hypercholesteremia    Hyperglycemia 03/2020   Hypertension    Hyperthyroidism    Iron deficiency anemia 09/04/2016   Peripheral vertigo    Recurrent upper respiratory infection (URI)    Seasonal allergies 11/22/2019   Urticaria    Vertigo 11/22/2019   Vertigo     ALLERGIES:  is allergic to benadryl [diphenhydramine hcl] and chloroxine.  MEDICATIONS:  Current Outpatient Medications  Medication Sig Dispense Refill   atorvastatin (LIPITOR) 40 MG tablet Take 1 tablet (40 mg total) by mouth daily. 90 tablet 3   Biotin 65784 MCG TABS Take 1,000 mcg by  mouth daily.      busPIRone (BUSPAR) 10 MG tablet Take 1 tablet (10 mg total) by mouth daily. 90 tablet 3   cetirizine (ZYRTEC) 10 MG tablet Take 1 tablet (10 mg total) by mouth daily as needed for allergies. 90 tablet 1   CINNAMON PO Take by mouth.     doxycycline (VIBRAMYCIN) 100 MG capsule Take 1 capsule (100 mg total) by mouth 2 (two) times daily. 20 capsule 0   famotidine (PEPCID) 20 MG tablet Take 20 mg by mouth 2 (two) times daily. (Patient not taking: Reported on 02/24/2022)     ferrous sulfate 325 (65 FE) MG tablet Take 1 tablet (325 mg total) by mouth 2 (two) times daily with a meal. 60 tablet 1   Ginger, Zingiber officinalis, (GINGER PO) Take by mouth.     lisinopril (ZESTRIL) 5 MG tablet Take 1 tablet (5 mg total) by mouth daily. 90 tablet 3   medroxyPROGESTERone (DEPO-PROVERA) 150 MG/ML injection ADMINISTER 1 ML IN THE MUSCLE 1 TIME FOR 1 DOSE 1 mL 0   methimazole (TAPAZOLE) 5 MG tablet Take 1 tablet (5 mg total) by mouth daily. 90 tablet 3   metoprolol tartrate (LOPRESSOR) 25 MG tablet TAKE ONE-HALF (1/2) TABLET EVERY MORNING 90 tablet 3   Multiple Vitamins-Minerals (MULTIVITAMIN ADULT PO) Take by mouth daily.     omeprazole (PRILOSEC) 40 MG capsule Take 1 capsule (40 mg total) by mouth daily. 30 capsule  1   OVER THE COUNTER MEDICATION Beet Root supplement     POTASSIUM PO Take by mouth.     Probiotic Product (PROBIOTIC-10 PO) Take by mouth. 1 caples     No current facility-administered medications for this visit.    SURGICAL HISTORY:  Past Surgical History:  Procedure Laterality Date   LAPAROSCOPIC APPENDECTOMY N/A 01/14/2017   Procedure: APPENDECTOMY LAPAROSCOPIC;  Surgeon: Fanny Skates, MD;  Location: [facility];  Service: General;  Laterality: N/A;    REVIEW OF SYSTEMS:  A comprehensive review of systems was negative.   PHYSICAL EXAMINATION: General appearance: alert, cooperative, appears stated age, and no distress Head: Normocephalic, without obvious abnormality,  atraumatic Neck: no adenopathy, no JVD, supple, symmetrical, trachea midline, and thyroid not enlarged, symmetric, no tenderness/mass/nodules Lymph nodes: Cervical, supraclavicular, and axillary nodes normal. Resp: clear to auscultation bilaterally Back: symmetric, no curvature. ROM normal. No CVA tenderness. Cardio: regular rate and rhythm, S1, S2 normal, no murmur, click, rub or gallop GI: soft, non-tender; bowel sounds normal; no masses,  no organomegaly Extremities: extremities normal, atraumatic, no cyanosis or edema  ECOG PERFORMANCE STATUS: 0 - Asymptomatic  Blood pressure (!) 155/82, pulse 72, temperature 97.8 F (36.6 C), temperature source Oral, resp. rate 18, weight 214 lb 7 oz (97.3 kg), SpO2 100 %.  LABORATORY DATA: Lab Results  Component Value Date   WBC 6.8 05/27/2022   HGB 12.0 05/27/2022   HCT 36.1 05/27/2022   MCV 85.1 05/27/2022   PLT 192 05/27/2022      Chemistry      Component Value Date/Time   NA 138 11/26/2021 0805   NA 140 10/02/2020 1132   NA 132 (L) 08/13/2017 1044   K 3.7 11/26/2021 0805   K 4.3 08/13/2017 1044   CL 106 11/26/2021 0805   CO2 25 11/26/2021 0805   CO2 25 08/13/2017 1044   BUN 11 11/26/2021 0805   BUN 10 10/02/2020 1132   BUN 18.6 08/13/2017 1044   CREATININE 0.74 11/26/2021 0805   CREATININE 1.3 (H) 08/13/2017 1044   GLU 517 (H) 08/13/2017 1257      Component Value Date/Time   CALCIUM 9.6 11/26/2021 0805   CALCIUM 9.9 08/13/2017 1044   ALKPHOS 62 11/26/2021 0805   ALKPHOS 100 08/13/2017 1044   AST 14 (L) 11/26/2021 0805   AST 5 08/13/2017 1044   ALT 34 11/26/2021 0805   ALT 15 08/13/2017 1044   BILITOT 0.7 11/26/2021 0805   BILITOT 0.52 08/13/2017 1044       RADIOGRAPHIC STUDIES: No results found.  ASSESSMENT AND PLAN: This is a very pleasant 31 years old African-American female with: 1) idiopathic thrombocytopenic purpura: She was treated a few months ago with a taper dose of prednisone because of the significant  thrombocytopenia.  The patient was treated in the past with steroids and has been doing fine. She is currently on observation and has been doing well with no concerning complaints. Repeat CBC today showed normal platelets count as well as total white blood count, hemoglobin and hematocrit. I recommended for the patient to continue on observation with repeat blood count in 6 months. 2) For the hyperthyroidism, she is currently on methimazole and followed by endocrinology. The patient was advised to call immediately if she has any other concerning symptoms in the interval.  The patient voices understanding of current disease status and treatment options and is in agreement with the current care plan. All questions were answered. The patient knows to call the clinic  with any problems, questions or concerns. We can certainly see the patient much sooner if necessary.  Disclaimer: This note was dictated with voice recognition software. Similar sounding words can inadvertently be transcribed and may not be corrected upon review.       

## 2022-05-29 ENCOUNTER — Other Ambulatory Visit: Payer: Self-pay | Admitting: Nurse Practitioner

## 2022-05-29 DIAGNOSIS — D508 Other iron deficiency anemias: Secondary | ICD-10-CM

## 2022-06-02 ENCOUNTER — Other Ambulatory Visit: Payer: Self-pay

## 2022-06-17 ENCOUNTER — Ambulatory Visit: Payer: Managed Care, Other (non HMO) | Admitting: Radiology

## 2022-06-17 VITALS — BP 126/84

## 2022-06-17 DIAGNOSIS — B3731 Acute candidiasis of vulva and vagina: Secondary | ICD-10-CM

## 2022-06-17 DIAGNOSIS — N898 Other specified noninflammatory disorders of vagina: Secondary | ICD-10-CM | POA: Diagnosis not present

## 2022-06-17 DIAGNOSIS — N764 Abscess of vulva: Secondary | ICD-10-CM

## 2022-06-17 DIAGNOSIS — B9689 Other specified bacterial agents as the cause of diseases classified elsewhere: Secondary | ICD-10-CM

## 2022-06-17 DIAGNOSIS — N760 Acute vaginitis: Secondary | ICD-10-CM

## 2022-06-17 LAB — WET PREP FOR TRICH, YEAST, CLUE

## 2022-06-17 MED ORDER — METRONIDAZOLE 0.75 % VA GEL: 1.0000 | Freq: Every day | VAGINAL | 0 refills | Status: AC

## 2022-06-17 MED ORDER — FLUCONAZOLE 150 MG PO TABS: 150.0000 mg | ORAL_TABLET | ORAL | 0 refills | Status: DC

## 2022-06-17 MED ORDER — SULFAMETHOXAZOLE-TRIMETHOPRIM 800-160 MG PO TABS: 1.0000 | ORAL_TABLET | Freq: Two times a day (BID) | ORAL | 0 refills | Status: DC

## 2022-06-17 NOTE — Progress Notes (Signed)
      Subjective: Kellie Shaffer is a 31 y.o. female who complains of vaginal discharge condom came off with intercourse last week. Desires STI screening.  Complains of ? Abscess at perineum, was treated at the ED 4 months ago with doxy and it significantly improved but a small area is still tender.    Review of Systems  All other systems reviewed and are negative.   Past Medical History:  Diagnosis Date   Abnormal Pap smear of cervix 2019   LSIL   Abortion May 2013   Anemia    Angio-edema    Anxiety    Diabetes mellitus without complication ([place])    Dizziness 11/22/2019   Hemoglobin A1c less than 7.0% 03/2020   History of ITP    History of palpitations    Cardiac event monitor June 2019: Normal.  Sinus rhythm no arrhythmias or significant PACs/PVCs.   Hypercholesteremia    Hyperglycemia 03/2020   Hypertension    Hyperthyroidism    Iron deficiency anemia 09/04/2016   Peripheral vertigo    Recurrent upper respiratory infection (URI)    Seasonal allergies 11/22/2019   Urticaria    Vertigo 11/22/2019   Vertigo       Objective:  Today's Vitals   06/17/22 0810  BP: 126/84   There is no height or weight on file to calculate BMI.   Physical Exam Vitals reviewed.  Constitutional:      Appearance: Normal appearance. She is obese.  Abdominal:     General: Abdomen is flat.     Palpations: Abdomen is soft.  Genitourinary:    Labia:        Left: Lesion present.      Vagina: Vaginal discharge and erythema present.    Neurological:     Mental Status: She is alert.  Psychiatric:        Thought Content: Thought content normal.        Judgment: Judgment normal.     Microscopic wet-mount exam shows clue cells, hyphae.   Chaperone offered and declined.  Assessment:/Plan:  1. Vaginal discharge - WET PREP FOR TRICH, YEAST, CLUE - SURESWAB CT/NG/T. vaginalis  2. BV (bacterial vaginosis) - metroNIDAZOLE (METROGEL) 0.75 % vaginal gel; Place 1 Applicatorful  vaginally at bedtime for 5 days.  Dispense: 70 g; Refill: 0  3. Yeast vaginitis - fluconazole (DIFLUCAN) 150 MG tablet; Take 1 tablet (150 mg total) by mouth every 3 (three) days.  Dispense: 3 tablet; Refill: 0  4. Vulvar abscess - sulfamethoxazole-trimethoprim (BACTRIM DS) 800-160 MG tablet; Take 1 tablet by mouth 2 (two) times daily.  Dispense: 14 tablet; Refill: 0  Warm compresses BID  Will contact patient with results of testing completed today. Avoid intercourse until symptoms are resolved. Safe sex encouraged. Avoid the use of soaps or perfumed products in the peri area. Avoid tub baths and sitting in sweaty or wet clothing for prolonged periods of time.

## 2022-06-18 LAB — SURESWAB CT/NG/T. VAGINALIS
C. trachomatis RNA, TMA: NOT DETECTED
N. gonorrhoeae RNA, TMA: DETECTED — AB
Trichomonas vaginalis RNA: NOT DETECTED

## 2022-06-22 ENCOUNTER — Ambulatory Visit (INDEPENDENT_AMBULATORY_CARE_PROVIDER_SITE_OTHER): Payer: Managed Care, Other (non HMO)

## 2022-06-22 DIAGNOSIS — A549 Gonococcal infection, unspecified: Secondary | ICD-10-CM | POA: Diagnosis not present

## 2022-06-22 MED ORDER — CEFTRIAXONE SODIUM 500 MG IJ SOLR
500.0000 mg | Freq: Once | INTRAMUSCULAR | Status: AC
  Administered 2022-06-22: 500 mg via INTRAMUSCULAR

## 2022-06-26 ENCOUNTER — Emergency Department (HOSPITAL_BASED_OUTPATIENT_CLINIC_OR_DEPARTMENT_OTHER): Payer: Managed Care, Other (non HMO)

## 2022-06-26 ENCOUNTER — Encounter (HOSPITAL_BASED_OUTPATIENT_CLINIC_OR_DEPARTMENT_OTHER): Payer: Self-pay | Admitting: *Deleted

## 2022-06-26 ENCOUNTER — Emergency Department (HOSPITAL_BASED_OUTPATIENT_CLINIC_OR_DEPARTMENT_OTHER)
Admission: EM | Admit: 2022-06-26 | Discharge: 2022-06-26 | Disposition: A | Discharge status: Home / Self Care | Payer: Managed Care, Other (non HMO) | Attending: Emergency Medicine | Admitting: Emergency Medicine

## 2022-06-26 DIAGNOSIS — R102 Pelvic and perineal pain: Secondary | ICD-10-CM | POA: Diagnosis not present

## 2022-06-26 DIAGNOSIS — R000 Tachycardia, unspecified: Secondary | ICD-10-CM | POA: Diagnosis not present

## 2022-06-26 DIAGNOSIS — R110 Nausea: Secondary | ICD-10-CM | POA: Insufficient documentation

## 2022-06-26 DIAGNOSIS — R197 Diarrhea, unspecified: Secondary | ICD-10-CM | POA: Diagnosis not present

## 2022-06-26 DIAGNOSIS — Z79899 Other long term (current) drug therapy: Secondary | ICD-10-CM | POA: Insufficient documentation

## 2022-06-26 DIAGNOSIS — R52 Pain, unspecified: Secondary | ICD-10-CM

## 2022-06-26 DIAGNOSIS — R0682 Tachypnea, not elsewhere classified: Secondary | ICD-10-CM | POA: Diagnosis not present

## 2022-06-26 DIAGNOSIS — R509 Fever, unspecified: Secondary | ICD-10-CM | POA: Diagnosis present

## 2022-06-26 DIAGNOSIS — Z20822 Contact with and (suspected) exposure to covid-19: Secondary | ICD-10-CM | POA: Insufficient documentation

## 2022-06-26 LAB — URINALYSIS, MICROSCOPIC (REFLEX)

## 2022-06-26 LAB — CBC WITH DIFFERENTIAL/PLATELET
Abs Immature Granulocytes: 0.02 10*3/uL (ref 0.00–0.07)
Basophils Absolute: 0 10*3/uL (ref 0.0–0.1)
Basophils Relative: 0 %
Eosinophils Absolute: 0.1 10*3/uL (ref 0.0–0.5)
Eosinophils Relative: 2 %
HCT: 40.5 % (ref 36.0–46.0)
Hemoglobin: 13.2 g/dL (ref 12.0–15.0)
Immature Granulocytes: 0 %
Lymphocytes Relative: 30 %
Lymphs Abs: 2.1 10*3/uL (ref 0.7–4.0)
MCH: 27.6 pg (ref 26.0–34.0)
MCHC: 32.6 g/dL (ref 30.0–36.0)
MCV: 84.7 fL (ref 80.0–100.0)
Monocytes Absolute: 0.5 10*3/uL (ref 0.1–1.0)
Monocytes Relative: 7 %
Neutro Abs: 4.4 10*3/uL (ref 1.7–7.7)
Neutrophils Relative %: 61 %
Platelets: 57 10*3/uL — ABNORMAL LOW (ref 150–400)
RBC: 4.78 MIL/uL (ref 3.87–5.11)
RDW: 13.7 % (ref 11.5–15.5)
Smear Review: NORMAL
WBC: 7.1 10*3/uL (ref 4.0–10.5)
nRBC: 0 % (ref 0.0–0.2)

## 2022-06-26 LAB — WET PREP, GENITAL
Clue Cells Wet Prep HPF POC: NONE SEEN
Sperm: NONE SEEN
Trich, Wet Prep: NONE SEEN
WBC, Wet Prep HPF POC: <10 (ref <10)
Yeast Wet Prep HPF POC: NONE SEEN

## 2022-06-26 LAB — COMPREHENSIVE METABOLIC PANEL
ALT: 93 U/L — ABNORMAL HIGH (ref 0–44)
AST: 44 U/L — ABNORMAL HIGH (ref 15–41)
Albumin: 3.8 g/dL (ref 3.5–5.0)
Alkaline Phosphatase: 50 U/L (ref 38–126)
Anion gap: 8 (ref 5–15)
BUN: 11 mg/dL (ref 6–20)
CO2: 23 mmol/L (ref 22–32)
Calcium: 8.9 mg/dL (ref 8.9–10.3)
Chloride: 103 mmol/L (ref 98–111)
Creatinine, Ser: 0.74 mg/dL (ref 0.44–1.00)
GFR, Estimated: >60 mL/min (ref >60)
Glucose, Bld: 177 mg/dL — ABNORMAL HIGH (ref 70–99)
Potassium: 3.9 mmol/L (ref 3.5–5.1)
Sodium: 134 mmol/L — ABNORMAL LOW (ref 135–145)
Total Bilirubin: 0.9 mg/dL (ref 0.3–1.2)
Total Protein: 8.5 g/dL — ABNORMAL HIGH (ref 6.5–8.1)

## 2022-06-26 LAB — URINALYSIS, ROUTINE W REFLEX MICROSCOPIC
Bilirubin Urine: NEGATIVE
Glucose, UA: NEGATIVE mg/dL
Hgb urine dipstick: NEGATIVE
Ketones, ur: NEGATIVE mg/dL
Leukocytes,Ua: NEGATIVE
Nitrite: NEGATIVE
Protein, ur: 30 mg/dL — AB
Specific Gravity, Urine: 1.02 (ref 1.005–1.030)
pH: 6 (ref 5.0–8.0)

## 2022-06-26 LAB — SARS CORONAVIRUS 2 BY RT PCR: SARS Coronavirus 2 by RT PCR: NEGATIVE

## 2022-06-26 LAB — PREGNANCY, URINE: Preg Test, Ur: NEGATIVE

## 2022-06-26 LAB — LACTIC ACID, PLASMA: Lactic Acid, Venous: 1.1 mmol/L (ref 0.5–1.9)

## 2022-06-26 MED ORDER — ONDANSETRON 4 MG PO TBDP: 4.0000 mg | ORAL_TABLET | Freq: Three times a day (TID) | ORAL | 0 refills | Status: DC | PRN

## 2022-06-26 MED ORDER — IOHEXOL 300 MG/ML  SOLN
100.0000 mL | Freq: Once | INTRAMUSCULAR | Status: AC | PRN
  Administered 2022-06-26: 100 mL via INTRAVENOUS

## 2022-06-26 MED ORDER — ACETAMINOPHEN 325 MG PO TABS
650.0000 mg | ORAL_TABLET | Freq: Once | ORAL | Status: AC
  Administered 2022-06-26: 650 mg via ORAL
  Filled 2022-06-26: qty 2

## 2022-06-26 MED ORDER — ONDANSETRON HCL 4 MG/2ML IJ SOLN
4.0000 mg | Freq: Once | INTRAMUSCULAR | Status: AC
  Administered 2022-06-26: 4 mg via INTRAVENOUS
  Filled 2022-06-26: qty 2

## 2022-06-26 MED ORDER — LACTATED RINGERS IV BOLUS
1000.0000 mL | Freq: Once | INTRAVENOUS | Status: AC
  Administered 2022-06-26: 1000 mL via INTRAVENOUS

## 2022-06-26 NOTE — ED Triage Notes (Signed)
Rec Rocephin IM this past Monday, Tuesday began body aches, fever, diarrhea, nausea, denies vomiting. No itching or rashes per pt, ABC WNL,

## 2022-06-26 NOTE — ED Notes (Signed)
BLOOD CULTURES X 2 OBTAINED (site #1 Left AC) (site #2 rt wrist)

## 2022-06-26 NOTE — ED Notes (Signed)
Actual wt 104.9kg

## 2022-06-26 NOTE — ED Provider Notes (Signed)
[facility] EMERGENCY DEPARTMENT Provider Note   CSN: [id number] Arrival date & time: 06/26/22  0947     History  Chief Complaint  Patient presents with   Generalized Body Aches    Kellie Shaffer is a 31 y.o. female.  Patient presents to the ED with complaint of fever, body aches, nausea, diarrhea onset on Tuesday. Patient received rocephin IM on Monday for GC. No known recent sick contacts. Patient is currently febrile, tachycardic, and mildly tachypneic. Denies chest pain, cough, shortness of breath, vomiting. General abdominal discomfort.   Fever Severity:  Moderate Duration:  5 days Timing:  Intermittent Progression:  Waxing and waning Chronicity:  New Associated symptoms: chills, myalgias and nausea   Associated symptoms: no cough and no vomiting        Home Medications Prior to Admission medications   Medication Sig Start Date End Date Taking? Authorizing Provider  atorvastatin (LIPITOR) 40 MG tablet Take 1 tablet (40 mg total) by mouth daily. 02/12/22 02/12/23  [name], NP  Biotin 42595 MCG TABS Take 1,000 mcg by mouth daily.     [provider]  cetirizine (ZYRTEC) 10 MG tablet Take 1 tablet (10 mg total) by mouth daily as needed for allergies. 08/21/20   Marcelyn Bruins, MD  CINNAMON PO Take by mouth.    [provider]  FEROSUL 325 (65 Fe) MG tablet TAKE 1 TABLET(325 MG) BY MOUTH TWICE DAILY WITH A MEAL 06/02/22   [name], NP  fluconazole (DIFLUCAN) 150 MG tablet Take 1 tablet (150 mg total) by mouth every 3 (three) days. 06/17/22   Chrzanowski, Jami B, NP  Ginger, Zingiber officinalis, (GINGER PO) Take by mouth.    [provider]  lisinopril (ZESTRIL) 5 MG tablet Take 1 tablet (5 mg total) by mouth daily. 02/12/22   [name], NP  medroxyPROGESTERone (DEPO-PROVERA) 150 MG/ML injection ADMINISTER 1 ML IN THE MUSCLE 1 TIME FOR 1 DOSE 04/28/22   Chrzanowski, Jami B, NP  methimazole (TAPAZOLE) 5  MG tablet Take 1 tablet (5 mg total) by mouth daily. 11/11/21   Romero Belling, MD  metoprolol tartrate (LOPRESSOR) 25 MG tablet TAKE ONE-HALF (1/2) TABLET EVERY MORNING 07/22/21   Barbette Merino, NP  Multiple Vitamins-Minerals (MULTIVITAMIN ADULT PO) Take by mouth daily.    [provider]  omeprazole (PRILOSEC) 40 MG capsule Take 1 capsule (40 mg total) by mouth daily. 01/21/22   Si Gaul, MD  OVER THE COUNTER MEDICATION Beet Root supplement    [provider]  POTASSIUM PO Take by mouth.    [provider]  Probiotic Product (PROBIOTIC-10 PO) Take by mouth. 1 caples    [provider]  sulfamethoxazole-trimethoprim (BACTRIM DS) 800-160 MG tablet Take 1 tablet by mouth 2 (two) times daily. 06/17/22   Chrzanowski, Lamona Curl, NP      Allergies    Benadryl [diphenhydramine hcl] and Chloroxine    Review of Systems   Review of Systems  Constitutional:  Positive for chills and fever.  Respiratory:  Negative for cough and shortness of breath.   Gastrointestinal:  Positive for nausea. Negative for vomiting.  Musculoskeletal:  Positive for myalgias.    Physical Exam Updated Vital Signs BP 135/87 (BP Location: Right Arm)   Pulse (!) 117   Temp (!) 101.5 F (38.6 C)   Resp (!) 22   SpO2 97%  Physical Exam Vitals and nursing note reviewed. Exam conducted with a chaperone present.  Constitutional:  Appearance: Normal appearance.  HENT:     Head: Normocephalic.     Nose: Nose normal.  Eyes:     Conjunctiva/sclera: Conjunctivae normal.  Cardiovascular:     Rate and Rhythm: Tachycardia present.  Pulmonary:     Effort: Pulmonary effort is normal.     Breath sounds: Normal breath sounds.  Abdominal:     General: Bowel sounds are normal. There is no distension.     Palpations: Abdomen is soft.     Tenderness: There is no abdominal tenderness.  Genitourinary:    General: Normal vulva.     Exam position: Lithotomy position.     Pubic Area: No  rash.      Labia:        Right: No lesion.        Left: No lesion.      Vagina: No foreign body. Tenderness present.     Cervix: Normal.     Adnexa: Right adnexa normal and left adnexa normal.     Comments: Small amount of blood in vaginal vault. No adnexal tenderness. No CMT.  Musculoskeletal:        General: Normal range of motion.  Skin:    General: Skin is warm and dry.  Neurological:     Mental Status: She is alert and oriented to person, place, and time.  Psychiatric:        Mood and Affect: Mood normal.        Behavior: Behavior normal.    ED Results / Procedures / Treatments   Labs (all labs ordered are listed, but only abnormal results are displayed) Labs Reviewed  URINALYSIS, ROUTINE W REFLEX MICROSCOPIC - Abnormal; Notable for the following components:      Result Value   Color, Urine ORANGE (*)    Protein, ur 30 (*)    All other components within normal limits  CBC WITH DIFFERENTIAL/PLATELET - Abnormal; Notable for the following components:   Platelets 57 (*)    All other components within normal limits  COMPREHENSIVE METABOLIC PANEL - Abnormal; Notable for the following components:   Sodium 134 (*)    Glucose, Bld 177 (*)    Total Protein 8.5 (*)    AST 44 (*)    ALT 93 (*)    All other components within normal limits  URINALYSIS, MICROSCOPIC (REFLEX) - Abnormal; Notable for the following components:   Bacteria, UA FEW (*)    All other components within normal limits  SARS CORONAVIRUS 2 BY RT PCR  WET PREP, GENITAL  CULTURE, BLOOD (ROUTINE X 2)  CULTURE, BLOOD (ROUTINE X 2)  PREGNANCY, URINE  LACTIC ACID, PLASMA    EKG None  Radiology CT Abdomen Pelvis W Contrast  Result Date: 06/26/2022 CLINICAL DATA:  Vulvar abscess. EXAM: CT ABDOMEN AND PELVIS WITH CONTRAST TECHNIQUE: Multidetector CT imaging of the abdomen and pelvis was performed using the standard protocol following bolus administration of intravenous contrast. RADIATION DOSE REDUCTION: This  exam was performed according to the departmental dose-optimization program which includes automated exposure control, adjustment of the mA and/or kV according to patient size and/or use of iterative reconstruction technique. CONTRAST:  183mL OMNIPAQUE IOHEXOL 300 MG/ML  SOLN COMPARISON:  Jan 14, 2017. FINDINGS: Lower chest: No acute abnormality. Hepatobiliary: No focal liver abnormality is seen. No gallstones, gallbladder wall thickening, or biliary dilatation. Pancreas: Unremarkable. No pancreatic ductal dilatation or surrounding inflammatory changes. Spleen: Normal in size without focal abnormality. Adrenals/Urinary Tract: Adrenal glands are unremarkable. Kidneys are normal, without  renal calculi, focal lesion, or hydronephrosis. Bladder is unremarkable. Stomach/Bowel: Stomach appears normal. Status post appendectomy. There is no evidence of bowel obstruction or inflammation. Vascular/Lymphatic: No significant vascular findings are present. No enlarged abdominal or pelvic lymph nodes. Reproductive: Uterus and bilateral adnexa are unremarkable. Other: No abdominal wall hernia or abnormality. No abdominopelvic ascites. Musculoskeletal: No acute or significant osseous findings. IMPRESSION: No definite abnormality seen in the abdomen or pelvis. Electronically Signed   By: Marijo Conception M.D.   On: 06/26/2022 12:46   DG Chest Port 1 View  Result Date: 06/26/2022 CLINICAL DATA:  Sepsis EXAM: PORTABLE CHEST 1 VIEW COMPARISON:  Chest x-ray dated Jan 14, 2019 FINDINGS: The heart size and mediastinal contours are within normal limits. Both lungs are clear. The visualized skeletal structures are unremarkable. IMPRESSION: No active disease. Electronically Signed   By: Yetta Glassman M.D.   On: 06/26/2022 10:33    Procedures Procedures    Medications Ordered in ED Medications  acetaminophen (TYLENOL) tablet 650 mg (650 mg Oral Given 06/26/22 1006)  lactated ringers bolus 1,000 mL (0 mLs Intravenous Stopped  06/26/22 1243)  ondansetron (ZOFRAN) injection 4 mg (4 mg Intravenous Given 06/26/22 1042)  iohexol (OMNIPAQUE) 300 MG/ML solution 100 mL (100 mLs Intravenous Contrast Given 06/26/22 1227)    ED Course/ Medical Decision Making/ A&P                           Medical Decision Making Patient felt to have symptoms concerning for potential infection based on fever, elevated heart rate, and mild tachypnea. Labs were obtained to include lactate, CBC with diff, and blood cultures. No leukocytosis. Low platelet count in context of known ITP. Initial lactate within normal limits. Considered possible sources of infection including respiratory, CXR unremarkable. Negative for COVID. No indication of UTI or PID. No indication of soft tissue infection. Patient was given fluid bolus upon arrival in ED. No evidence of septic shock based on no vasopressor requirement and serum lactate < 2. Patient improved after treatment in ED. Plan on discharge home. Return precautions provided. Patient appears safe for discharge at this time.  Amount and/or Complexity of Data Reviewed Labs: ordered. Decision-making details documented in ED Course. Radiology: ordered. Decision-making details documented in ED Course.  Risk OTC drugs. Prescription drug management.           Final Clinical Impression(s) / ED Diagnoses Final diagnoses:  Body aches  Febrile illness    Rx / DC Orders ED Discharge Orders          Ordered    ondansetron (ZOFRAN-ODT) 4 MG disintegrating tablet  Every 8 hours PRN        06/26/22 1457              Etta Quill, NP 06/26/22 Kathyrn Drown    Ezequiel Essex, MD 06/27/22 [phone]

## 2022-06-26 NOTE — Discharge Instructions (Signed)
Rest. Drink plenty of fluids. Tylenol and/or ibuprofen for fever and discomfort. Zofran as needed for nausea. Follow-up with your primary care provider, or return to the ED if symptoms persist or worsen.

## 2022-06-26 NOTE — ED Notes (Signed)
CLIENT NOTED TO BE SIRS POSITIVE - ED MD/ PA NOTIFIED, SUSPECTED INFECTION ORDERS INITIATED PER SUSPECTED SEPSIS PROTOCOL

## 2022-06-26 NOTE — ED Notes (Signed)
1st LR Fluid Bolus initiated

## 2022-07-01 LAB — CULTURE, BLOOD (ROUTINE X 2)
Culture: NO GROWTH
Culture: NO GROWTH
Special Requests: ADEQUATE

## 2022-07-16 ENCOUNTER — Ambulatory Visit: Payer: Managed Care, Other (non HMO) | Admitting: Radiology

## 2022-07-16 VITALS — BP 120/78

## 2022-07-16 DIAGNOSIS — Z8619 Personal history of other infectious and parasitic diseases: Secondary | ICD-10-CM | POA: Diagnosis not present

## 2022-07-16 DIAGNOSIS — N760 Acute vaginitis: Secondary | ICD-10-CM | POA: Diagnosis not present

## 2022-07-16 LAB — WET PREP FOR TRICH, YEAST, CLUE

## 2022-07-16 NOTE — Progress Notes (Signed)
      Subjective: Kellie Shaffer is a 31 y.o. female Desires recheck, treated for gonorrhea 06/22/22. Complains of persistent vaginal discharge, no itching, no odor. Has not been sexually active since treatment.     Review of Systems  All other systems reviewed and are negative.   Past Medical History:  Diagnosis Date   Abnormal Pap smear of cervix 2019   LSIL   Abortion May 2013   Anemia    Angio-edema    Anxiety    Diabetes mellitus without complication (HCC)    Dizziness 11/22/2019   Hemoglobin A1c less than 7.0% 03/2020   History of ITP    History of palpitations    Cardiac event monitor June 2019: Normal.  Sinus rhythm no arrhythmias or significant PACs/PVCs.   Hypercholesteremia    Hyperglycemia 03/2020   Hypertension    Hyperthyroidism    Iron deficiency anemia 09/04/2016   Peripheral vertigo    Recurrent upper respiratory infection (URI)    Seasonal allergies 11/22/2019   Urticaria    Vertigo 11/22/2019   Vertigo       Objective:  Today's Vitals   07/16/22 0757  BP: 120/78   There is no height or weight on file to calculate BMI.   -General: no acute distress -Vulva: without lesions or discharge -Vagina: discharge present, aptima swab and wet prep obtained -Cervix: no lesion or discharge, no CMT -Perineum: no lesions -Uterus: Mobile, non tender -Adnexa: no masses or tenderness   Microscopic wet-mount exam shows negative for pathogens, normal epithelial cells.   Chaperone offered and declined.  Assessment:/Plan:   1. Acute vaginitis  - WET PREP FOR TRICH, YEAST, CLUE - SureSwab Advanced Vaginitis Plus,TMA    Will contact patient with results of testing completed today. Avoid intercourse until symptoms are resolved. Safe sex encouraged. Avoid the use of soaps or perfumed products in the peri area. Avoid tub baths and sitting in sweaty or wet clothing for prolonged periods of time.

## 2022-07-17 LAB — SURESWAB® ADVANCED VAGINITIS PLUS,TMA
C. trachomatis RNA, TMA: NOT DETECTED
CANDIDA SPECIES: NOT DETECTED
Candida glabrata: NOT DETECTED
N. gonorrhoeae RNA, TMA: NOT DETECTED
SURESWAB(R) ADV BACTERIAL VAGINOSIS(BV),TMA: NEGATIVE
TRICHOMONAS VAGINALIS (TV),TMA: NOT DETECTED

## 2022-07-20 ENCOUNTER — Other Ambulatory Visit: Payer: Self-pay | Admitting: Radiology

## 2022-07-20 DIAGNOSIS — Z3042 Encounter for surveillance of injectable contraceptive: Secondary | ICD-10-CM

## 2022-07-22 ENCOUNTER — Ambulatory Visit: Payer: Managed Care, Other (non HMO) | Admitting: *Deleted

## 2022-07-22 ENCOUNTER — Other Ambulatory Visit: Payer: Self-pay | Admitting: Radiology

## 2022-07-22 DIAGNOSIS — Z3042 Encounter for surveillance of injectable contraceptive: Secondary | ICD-10-CM | POA: Diagnosis not present

## 2022-07-22 MED ORDER — MEDROXYPROGESTERONE ACETATE 150 MG/ML IM SUSP
150.0000 mg | Freq: Once | INTRAMUSCULAR | Status: AC
  Administered 2022-07-22: 150 mg via INTRAMUSCULAR

## 2022-07-22 MED ORDER — MEDROXYPROGESTERONE ACETATE 150 MG/ML IM SUSP: INTRAMUSCULAR | 1 refills | Status: DC

## 2022-07-22 NOTE — Progress Notes (Signed)
Patient came for depo provera today & she brings her depo with her. Her rx says that she has no refills left. Please send rx to her pharmacy

## 2022-07-30 ENCOUNTER — Ambulatory Visit: Payer: No Typology Code available for payment source | Admitting: Nurse Practitioner

## 2022-08-01 ENCOUNTER — Other Ambulatory Visit: Payer: Self-pay | Admitting: Nurse Practitioner

## 2022-08-01 DIAGNOSIS — I10 Essential (primary) hypertension: Secondary | ICD-10-CM

## 2022-08-02 ENCOUNTER — Other Ambulatory Visit: Payer: Self-pay | Admitting: Nurse Practitioner

## 2022-08-02 DIAGNOSIS — D508 Other iron deficiency anemias: Secondary | ICD-10-CM

## 2022-08-06 ENCOUNTER — Other Ambulatory Visit: Payer: Self-pay

## 2022-08-06 DIAGNOSIS — D508 Other iron deficiency anemias: Secondary | ICD-10-CM

## 2022-08-06 MED ORDER — FERROUS SULFATE 325 (65 FE) MG PO TABS: ORAL_TABLET | ORAL | 1 refills | Status: DC

## 2022-08-06 NOTE — Telephone Encounter (Signed)
Done KH 

## 2022-08-06 NOTE — Telephone Encounter (Signed)
Pt was at work  and will message me when she can talk. Kh

## 2022-08-06 NOTE — Telephone Encounter (Signed)
Caller & Relationship to patient:  MRN #  [id number]   Call Back Number:   Date of Last Office Visit: 08/02/2022     Date of Next Office Visit: 08/26/2022    Medication(s) to be Refilled: Iron pills  Preferred Pharmacy:   ** Please notify patient to allow 48-72 hours to process** **Let patient know to contact pharmacy at the end of the day to make sure medication is ready. ** **If patient has not been seen in a year or longer, book an appointment **Advise to use MyChart for refill requests OR to contact their pharmacy

## 2022-08-07 ENCOUNTER — Ambulatory Visit: Payer: No Typology Code available for payment source | Admitting: Internal Medicine

## 2022-08-26 ENCOUNTER — Ambulatory Visit: Payer: Managed Care, Other (non HMO) | Admitting: Nurse Practitioner

## 2022-08-26 ENCOUNTER — Encounter: Payer: Self-pay | Admitting: Nurse Practitioner

## 2022-08-26 VITALS — BP 126/79 | HR 70 | Ht 69.0 in | Wt 205.0 lb

## 2022-08-26 DIAGNOSIS — Z13220 Encounter for screening for lipoid disorders: Secondary | ICD-10-CM | POA: Diagnosis not present

## 2022-08-26 DIAGNOSIS — Z131 Encounter for screening for diabetes mellitus: Secondary | ICD-10-CM | POA: Diagnosis not present

## 2022-08-26 DIAGNOSIS — E119 Type 2 diabetes mellitus without complications: Secondary | ICD-10-CM

## 2022-08-26 DIAGNOSIS — I10 Essential (primary) hypertension: Secondary | ICD-10-CM | POA: Diagnosis not present

## 2022-08-26 MED ORDER — BUSPIRONE HCL 10 MG PO TABS: 10.0000 mg | ORAL_TABLET | Freq: Every day | ORAL | 2 refills | Status: AC

## 2022-08-26 NOTE — Progress Notes (Signed)
@Patient  ID: Kellie Shaffer, female    DOB: 09/08/1990, 32 y.o.   MRN: [id number]  Chief Complaint  Patient presents with   Follow-up    Referring provider: Fenton Foy, NP   HPI   Kellie Shaffer presents for follow up.  She  has a past medical history of Abnormal Pap smear of cervix (2019), Abortion (May 2013), Anemia, Angio-edema, Anxiety, Diabetes mellitus without complication ([place]), Dizziness (11/22/2019), Hemoglobin A1c less than 7.0% (03/2020), History of ITP, History of palpitations, Hypercholesteremia, Hyperglycemia (03/2020), Hypertension, Hyperthyroidism, Iron deficiency anemia (09/04/2016), Peripheral vertigo, Recurrent upper respiratory infection (URI), Seasonal allergies (11/22/2019), Urticaria, Vertigo (11/22/2019), and Vertigo.   Patient presents today for follow-up visit.  Overall she has been doing well with no new issues or concerns.  Patient is followed by hematology for anemia.  Patient is followed by endocrinology for thyroid disorder.  Patient does have an established OB/GYN. Needs refill on Buspar today. Denies f/c/s, n/v/d, hemoptysis, PND, leg swelling Denies chest pain or edema      Allergies  Allergen Reactions   Benadryl [Diphenhydramine Hcl] Anaphylaxis and Hives   Chloroxine Hives    Clorox    Immunization History  Administered Date(s) Administered   Influenza,inj,Quad PF,6+ Mos 10/04/2018, 06/21/2019   Pneumococcal-Unspecified 08/17/2009   Tdap 08/17/2010    Past Medical History:  Diagnosis Date   Abnormal Pap smear of cervix 2019   LSIL   Abortion May 2013   Anemia    Angio-edema    Anxiety    Diabetes mellitus without complication ([place])    Dizziness 11/22/2019   Hemoglobin A1c less than 7.0% 03/2020   History of ITP    History of palpitations    Cardiac event monitor June 2019: Normal.  Sinus rhythm no arrhythmias or significant PACs/PVCs.   Hypercholesteremia    Hyperglycemia 03/2020   Hypertension     Hyperthyroidism    Iron deficiency anemia 09/04/2016   Peripheral vertigo    Recurrent upper respiratory infection (URI)    Seasonal allergies 11/22/2019   Urticaria    Vertigo 11/22/2019   Vertigo     Tobacco History: Social History   Tobacco Use  Smoking Status Former   Packs/day: 0.25   Years: 10.00   Total pack years: 2.50   Types: Cigarettes   Start date: 2011   Quit date: 2021   Years since quitting: 3.0  Smokeless Tobacco Never   Counseling given: Not Answered   Outpatient Encounter Medications as of 08/26/2022  Medication Sig   atorvastatin (LIPITOR) 40 MG tablet TAKE 1 TABLET(40 MG) BY MOUTH DAILY   Biotin 10000 MCG TABS Take 1,000 mcg by mouth daily.    busPIRone (BUSPAR) 10 MG tablet Take 1 tablet (10 mg total) by mouth daily.   cetirizine (ZYRTEC) 10 MG tablet Take 1 tablet (10 mg total) by mouth daily as needed for allergies.   CINNAMON PO Take by mouth.   ferrous sulfate (FEROSUL) 325 (65 FE) MG tablet TAKE 1 TABLET(325 MG) BY MOUTH TWICE DAILY WITH A MEAL   Ginger, Zingiber officinalis, (GINGER PO) Take by mouth.   lisinopril (ZESTRIL) 5 MG tablet Take 1 tablet (5 mg total) by mouth daily.   medroxyPROGESTERone (DEPO-PROVERA) 150 MG/ML injection ADMINISTER 1 ML IN THE MUSCLE 1 TIME FOR 1 DOSE   methimazole (TAPAZOLE) 5 MG tablet Take 1 tablet (5 mg total) by mouth daily.   metoprolol tartrate (LOPRESSOR) 25 MG tablet TAKE ONE-HALF (1/2) TABLET EVERY MORNING   Multiple  Vitamins-Minerals (MULTIVITAMIN ADULT PO) Take by mouth daily.   omeprazole (PRILOSEC) 40 MG capsule Take 1 capsule (40 mg total) by mouth daily.   OVER THE COUNTER MEDICATION Beet Root supplement   POTASSIUM PO Take by mouth.   Probiotic Product (PROBIOTIC-10 PO) Take by mouth. 1 caples   No facility-administered encounter medications on file as of 08/26/2022.     Review of Systems  Review of Systems  Constitutional: Negative.   HENT: Negative.    Cardiovascular: Negative.    Gastrointestinal: Negative.   Allergic/Immunologic: Negative.   Neurological: Negative.   Psychiatric/Behavioral: Negative.         Physical Exam  BP 126/79   Pulse 70   Ht 5\' 9"  (1.753 m)   Wt 205 lb (93 kg)   SpO2 100%   BMI 30.27 kg/m   Wt Readings from Last 5 Encounters:  08/26/22 205 lb (93 kg)  06/26/22 231 lb 4.2 oz (104.9 kg)  05/27/22 214 lb 7 oz (97.3 kg)  03/22/22 225 lb (102.1 kg)  02/24/22 222 lb (100.7 kg)     Physical Exam Vitals and nursing note reviewed.  Constitutional:      General: She is not in acute distress.    Appearance: She is well-developed.  Cardiovascular:     Rate and Rhythm: Normal rate and regular rhythm.  Pulmonary:     Effort: Pulmonary effort is normal.     Breath sounds: Normal breath sounds.  Neurological:     Mental Status: She is alert and oriented to person, place, and time.       Assessment & Plan:   Hypertension - CBC - Comprehensive metabolic panel  2. Hemoglobin A1C between 7% and 9% indicating borderline diabetic control (HCC)  - Hemoglobin A1c  3. Diabetes mellitus screening  - Hemoglobin A1c  4. Screening cholesterol level  - Lipid Panel  Follow up:  Follow up in 6 months     Fenton Foy, NP 08/26/2022

## 2022-08-26 NOTE — Patient Instructions (Signed)
1. Hypertension, unspecified type  - CBC - Comprehensive metabolic panel  2. Hemoglobin A1C between 7% and 9% indicating borderline diabetic control (HCC)  - Hemoglobin A1c  3. Diabetes mellitus screening  - Hemoglobin A1c  4. Screening cholesterol level  - Lipid Panel  Follow up:  Follow up in 6 months

## 2022-08-26 NOTE — Assessment & Plan Note (Signed)
-   CBC - Comprehensive metabolic panel  2. Hemoglobin A1C between 7% and 9% indicating borderline diabetic control (HCC)  - Hemoglobin A1c  3. Diabetes mellitus screening  - Hemoglobin A1c  4. Screening cholesterol level  - Lipid Panel  Follow up:  Follow up in 6 months

## 2022-08-27 LAB — COMPREHENSIVE METABOLIC PANEL
ALT: 27 IU/L (ref 0–32)
AST: 14 IU/L (ref 0–40)
Albumin/Globulin Ratio: 1.6 (ref 1.2–2.2)
Albumin: 4.5 g/dL (ref 3.9–4.9)
Alkaline Phosphatase: 58 IU/L (ref 44–121)
BUN/Creatinine Ratio: 20 (ref 9–23)
BUN: 16 mg/dL (ref 6–20)
Bilirubin Total: 0.7 mg/dL (ref 0.0–1.2)
CO2: 21 mmol/L (ref 20–29)
Calcium: 9.4 mg/dL (ref 8.7–10.2)
Chloride: 104 mmol/L (ref 96–106)
Creatinine, Ser: 0.81 mg/dL (ref 0.57–1.00)
Globulin, Total: 2.8 g/dL (ref 1.5–4.5)
Glucose: 133 mg/dL — ABNORMAL HIGH (ref 70–99)
Potassium: 3.7 mmol/L (ref 3.5–5.2)
Sodium: 138 mmol/L (ref 134–144)
Total Protein: 7.3 g/dL (ref 6.0–8.5)
eGFR: 99 mL/min/{1.73_m2} (ref >59)

## 2022-08-27 LAB — HEMOGLOBIN A1C
Est. average glucose Bld gHb Est-mCnc: 128 mg/dL
Hgb A1c MFr Bld: 6.1 % — ABNORMAL HIGH (ref 4.8–5.6)

## 2022-08-27 LAB — LIPID PANEL
Chol/HDL Ratio: 3 ratio (ref 0.0–4.4)
Cholesterol, Total: 97 mg/dL — ABNORMAL LOW (ref 100–199)
HDL: 32 mg/dL — ABNORMAL LOW (ref >39)
LDL Chol Calc (NIH): 50 mg/dL (ref 0–99)
Triglycerides: 68 mg/dL (ref 0–149)
VLDL Cholesterol Cal: 15 mg/dL (ref 5–40)

## 2022-08-27 LAB — CBC
Hematocrit: 35.3 % (ref 34.0–46.6)
Hemoglobin: 11.5 g/dL (ref 11.1–15.9)
MCH: 27.4 pg (ref 26.6–33.0)
MCHC: 32.6 g/dL (ref 31.5–35.7)
MCV: 84 fL (ref 79–97)
Platelets: 160 10*3/uL (ref 150–450)
RBC: 4.19 x10E6/uL (ref 3.77–5.28)
RDW: 13.2 % (ref 11.7–15.4)
WBC: 6.1 10*3/uL (ref 3.4–10.8)

## 2022-09-23 ENCOUNTER — Emergency Department (HOSPITAL_COMMUNITY)
Admission: EM | Admit: 2022-09-23 | Discharge: 2022-09-23 | Disposition: A | Discharge status: Home / Self Care | Payer: Managed Care, Other (non HMO) | Attending: Emergency Medicine | Admitting: Emergency Medicine

## 2022-09-23 ENCOUNTER — Emergency Department (HOSPITAL_COMMUNITY): Payer: Managed Care, Other (non HMO)

## 2022-09-23 ENCOUNTER — Other Ambulatory Visit: Payer: Self-pay

## 2022-09-23 DIAGNOSIS — S92515A Nondisplaced fracture of proximal phalanx of left lesser toe(s), initial encounter for closed fracture: Secondary | ICD-10-CM

## 2022-09-23 DIAGNOSIS — W108XXA Fall (on) (from) other stairs and steps, initial encounter: Secondary | ICD-10-CM | POA: Insufficient documentation

## 2022-09-23 DIAGNOSIS — W19XXXA Unspecified fall, initial encounter: Secondary | ICD-10-CM

## 2022-09-23 DIAGNOSIS — M79675 Pain in left toe(s): Secondary | ICD-10-CM | POA: Diagnosis present

## 2022-09-23 MED ORDER — HYDROCODONE-ACETAMINOPHEN 5-325 MG PO TABS: 1.0000 | ORAL_TABLET | Freq: Four times a day (QID) | ORAL | 0 refills | Status: AC | PRN

## 2022-09-23 MED ORDER — IBUPROFEN 600 MG PO TABS: 600.0000 mg | ORAL_TABLET | Freq: Three times a day (TID) | ORAL | 0 refills | Status: AC | PRN

## 2022-09-23 MED ORDER — HYDROCODONE-ACETAMINOPHEN 5-325 MG PO TABS
1.0000 | ORAL_TABLET | Freq: Once | ORAL | Status: AC
  Administered 2022-09-23: 1 via ORAL
  Filled 2022-09-23: qty 1

## 2022-09-23 NOTE — ED Provider Triage Note (Signed)
Emergency Medicine Provider Triage Evaluation Note  RAYAN DYAL , a 32 y.o. female  was evaluated in triage.  Pt complains of pain between left great and second toe.  States that she fell down the stairs little over a week ago and jammed toes on a metal stair rail during the fall.  She denies hitting her head.  She states at first she was not concerned and thought that symptoms would resolve however she has continued to have pain so she came to the ED for further evaluation.  She is able to ambulate though does have some pain in the toes with ambulation.  Review of Systems  Positive: See HPI Negative: See HPI  Physical Exam  BP (!) 176/101 (BP Location: Left Arm)   Pulse 77   Temp 98.4 F (36.9 C) (Oral)   Resp 16   Wt 85 kg   SpO2 100%   BMI 27.67 kg/m  Gen:   Awake, no distress   Resp:  Normal effort  MSK:   With pain at the base of the left great toe and left second toe, worse at the base of the left second toe, remainder of foot and ankle is nontender, able to wiggle toes and move remainder of foot and ankle without difficulty Other:  2+ DP and PT pulse, normal sensation, foot is warm and feels well-perfused  Medical Decision Making  Medically screening exam initiated at 7:31 PM.  Appropriate orders placed.  SHAELIN LALLEY was informed that the remainder of the evaluation will be completed by another provider, this initial triage assessment does not replace that evaluation, and the importance of remaining in the ED until their evaluation is complete.     Suzzette Righter, PA-C 09/23/22 1932

## 2022-09-23 NOTE — Discharge Instructions (Addendum)
Thank you for letting us take care of you today.  You do have a break in the second toe of your left foot.  We placed buddy tape as well as a postop shoe prior to your discharge.  Please continue to do this until you no longer have pain or tenderness in that toe.  I recommend that you see your PCP within the next week for reevaluation to make sure that you are improving.  If you do not start noticing improvement, I recommend that you follow-up with orthopedics as needed.  I provided the information for [facility] who you may contact to schedule an appointment if needed.  I provided a small amount of pain medication for you to take as needed.  Please only take this for severe pain over the next few days.  Otherwise, you may take ibuprofen as prescribed as well as over-the-counter Tylenol to manage the pain.  Please see the attached rehabilitation exercises I provided that may assist you with managing your pain and healing.  If you develop any new concerns or injuries, please be reevaluated in the nearest emergency department.

## 2022-09-23 NOTE — ED Provider Notes (Signed)
Kellie Shaffer EMERGENCY DEPARTMENT AT [facility] Provider Note   CSN: [id number] Arrival date & time: 09/23/22  1853     History  Chief Complaint  Patient presents with   Foot Pain    Kellie Shaffer , a 32 y.o. female complains of pain between left great and second toe.  States that she fell down the stairs a little over a week ago and jammed toes on a metal stair rail during the fall.  She denies hitting her head.  She states at first she was not concerned and thought that symptoms would resolve however she has continued to have pain so she came to the ED for further evaluation.  She is able to ambulate though does have some pain in the toes with ambulation.     Home Medications Prior to Admission medications   Medication Sig Start Date End Date Taking? Authorizing Provider  HYDROcodone-acetaminophen (NORCO/VICODIN) 5-325 MG tablet Take 1 tablet by mouth every 6 (six) hours as needed for up to 3 days for moderate pain or severe pain. 09/23/22 09/26/22 Yes Daichi Moris L, PA-C  ibuprofen (ADVIL) 600 MG tablet Take 1 tablet (600 mg total) by mouth every 8 (eight) hours as needed for up to 5 days for moderate pain or mild pain. 09/23/22 09/28/22 Yes Lilyannah Zuelke L, PA-C  atorvastatin (LIPITOR) 40 MG tablet TAKE 1 TABLET(40 MG) BY MOUTH DAILY 08/04/22   [name], NP  Biotin 56387 MCG TABS Take 1,000 mcg by mouth daily.     [provider]  busPIRone (BUSPAR) 10 MG tablet Take 1 tablet (10 mg total) by mouth daily. 08/26/22 11/24/22  [name], NP  cetirizine (ZYRTEC) 10 MG tablet Take 1 tablet (10 mg total) by mouth daily as needed for allergies. 08/21/20   Marcelyn Bruins, MD  CINNAMON PO Take by mouth.    [provider]  ferrous sulfate (FEROSUL) 325 (65 FE) MG tablet TAKE 1 TABLET(325 MG) BY MOUTH TWICE DAILY WITH A MEAL 08/06/22   [name], NP  Ginger, Zingiber officinalis, (GINGER PO) Take by mouth.    [provider]   lisinopril (ZESTRIL) 5 MG tablet Take 1 tablet (5 mg total) by mouth daily. 02/12/22   [name], NP  medroxyPROGESTERone (DEPO-PROVERA) 150 MG/ML injection ADMINISTER 1 ML IN THE MUSCLE 1 TIME FOR 1 DOSE 07/22/22   Chrzanowski, Jami B, NP  methimazole (TAPAZOLE) 5 MG tablet Take 1 tablet (5 mg total) by mouth daily. 11/11/21   Romero Belling, MD  metoprolol tartrate (LOPRESSOR) 25 MG tablet TAKE ONE-HALF (1/2) TABLET EVERY MORNING 07/22/21   Barbette Merino, NP  Multiple Vitamins-Minerals (MULTIVITAMIN ADULT PO) Take by mouth daily.    [provider]  omeprazole (PRILOSEC) 40 MG capsule Take 1 capsule (40 mg total) by mouth daily. 01/21/22   Si Gaul, MD  OVER THE COUNTER MEDICATION Beet Root supplement    [provider]  POTASSIUM PO Take by mouth.    [provider]  Probiotic Product (PROBIOTIC-10 PO) Take by mouth. 1 caples    [provider]      Allergies    Benadryl [diphenhydramine hcl] and Chloroxine    Review of Systems   Review of Systems  Constitutional:  Negative for chills and fever.  HENT:  Negative for ear pain and sore throat.   Eyes:  Negative for pain and visual disturbance.  Respiratory:  Negative for cough and shortness of breath.  Cardiovascular:  Negative for chest pain and palpitations.  Gastrointestinal:  Negative for abdominal pain and vomiting.  Genitourinary:  Negative for dysuria and hematuria.  Musculoskeletal:  Negative for back pain.       Left great and second toe pain  Skin:  Negative for color change and rash.  Neurological:  Negative for seizures and syncope.  All other systems reviewed and are negative.   Physical Exam Updated Vital Signs BP (!) 176/101 (BP Location: Left Arm)   Pulse 77   Temp 98.4 F (36.9 C) (Oral)   Resp 16   Wt 85 kg   SpO2 100%   BMI 27.67 kg/m  Physical Exam Vitals and nursing note reviewed.  Constitutional:      General: She is not in acute distress.     Appearance: Normal appearance.  HENT:     Head: Normocephalic and atraumatic.     Mouth/Throat:     Mouth: Mucous membranes are moist.  Eyes:     Extraocular Movements: Extraocular movements intact.     Conjunctiva/sclera: Conjunctivae normal.  Cardiovascular:     Rate and Rhythm: Normal rate and regular rhythm.     Heart sounds: No murmur heard. Pulmonary:     Effort: Pulmonary effort is normal.     Breath sounds: Normal breath sounds.  Abdominal:     General: Abdomen is flat.     Palpations: Abdomen is soft.     Tenderness: There is no abdominal tenderness. There is no guarding or rebound.  Musculoskeletal:     Cervical back: Normal range of motion and neck supple. No rigidity or tenderness.     Comments: No midline C/T/L-spine tenderness, step-offs, or deformities; moderate tenderness at the base of the left great toe and left second toe, worse at the base of the left second toe, remainder of foot and ankle is nontender, able to wiggle toes and move remainder of foot and ankle without difficulty, 2+ DP and PT pulse, normal sensation, foot is warm and feels well-perfused   Skin:    General: Skin is warm and dry.     Capillary Refill: Capillary refill takes less than 2 seconds.  Neurological:     Mental Status: She is alert. Mental status is at baseline.  Psychiatric:        Behavior: Behavior normal.     ED Results / Procedures / Treatments   Labs (all labs ordered are listed, but only abnormal results are displayed) Labs Reviewed - No data to display  EKG None  Radiology DG Foot Complete Left  Result Date: 09/23/2022 CLINICAL DATA:  Pain and swelling of the second toe EXAM: LEFT FOOT - COMPLETE 3+ VIEW COMPARISON:  None Available. FINDINGS: Nondisplaced fracture at the proximal metaphysis of the proximal phalanx of the second toe. No other regional traumatic finding. 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 When this is seen at an early age, it can  indicate tarsal coalition. IMPRESSION: 1. Nondisplaced fracture at the proximal metaphysis of the proximal phalanx of the second toe. 2. 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 When this is seen at an early age, it can indicate tarsal coalition. Electronically Signed   By: [name] Chimes M.D.   On: 09/23/2022 19:45    Procedures .Ortho Injury Treatment  Date/Time: 09/23/2022 8:00 PM  Performed by: Suzzette Righter, PA-C Authorized by: Suzzette Righter, PA-C   Consent:    Consent obtained:  Verbal   Consent given by:  Patient  Risks discussed:  Fracture   Alternatives discussed:  No treatment, alternative treatment, delayed treatment and referralInjury location: foot Location details: left foot Injury type: fracture Pre-procedure neurovascular assessment: neurovascularly intact Pre-procedure distal perfusion: normal Pre-procedure neurological function: normal Pre-procedure range of motion: normal Manipulation performed: no Splint type: Postop shoe and buddy taping. Splint Applied by: Kellie Shaffer Post-procedure neurovascular assessment: post-procedure neurovascularly intact Post-procedure distal perfusion: normal Post-procedure neurological function: normal Post-procedure range of motion: normal       Medications Ordered in ED Medications  HYDROcodone-acetaminophen (NORCO/VICODIN) 5-325 MG per tablet 1 tablet (1 tablet Oral Given 09/23/22 2021)    ED Course/ Medical Decision Making/ A&P                             Medical Decision Making Amount and/or Complexity of Data Reviewed Radiology: ordered. Decision-making details documented in ED Course.  Risk Prescription drug management.   Medical Decision Making:   SKYAH HANNON is a 32 y.o. female who presented to the ED today with left toe pain as detailed above.     Complete initial physical exam performed, notably the patient was with notable tenderness to the left great and second toes but  neurovascularly intact. Reviewed and confirmed nursing documentation for past medical history, family history, social history.    Initial Assessment:   With the patient's presentation of pain, most likely diagnosis is fracture. Other diagnoses were considered including (but not limited to) dislocation, sprain, strain. These are considered less likely due to history of present illness and physical exam findings.     Initial Plan:  Physical examination as above X-ray to evaluate for bony pathology Pain medication  Initial Study Results:   iology:  All images reviewed independently. Agree with radiology report at this time.   DG Foot Complete Left  Result Date: 09/23/2022 CLINICAL DATA:  Pain and swelling of the second toe EXAM: LEFT FOOT - COMPLETE 3+ VIEW COMPARISON:  None Available. FINDINGS: Nondisplaced fracture at the proximal metaphysis of the proximal phalanx of the second toe. No other regional traumatic finding. 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 When this is seen at an early age, it can indicate tarsal coalition. IMPRESSION: 1. Nondisplaced fracture at the proximal metaphysis of the proximal phalanx of the second toe. 2. 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 When this is seen at an early age, it can indicate tarsal coalition. Electronically Signed   By: [name] Chimes M.D.   On: 09/23/2022 19:45      Final Assessment and Plan:   This is a 32 year old female presenting to the ED complaining of left great and left second toe pain post mechanical fall.  Patient with tenderness on exam worse to the base of the left second toe.  X-ray obtained for further evaluation.  X-ray revealed nondisplaced fracture of the proximal metaphysis of the proximal phalanx of the second toe.  Patient is neurovascularly intact.  Ortho tech applied buddy taping to the toes as well as postop shoe.  Patient aware that she should continue using these until she no  longer has pain or tenderness in her foot.  Short course of pain medication including opiates and NSAIDs provided for acute fracture pain relief.  Discussed with patient recommendation that she follow-up with PCP for reevaluation in 1 week and/or provided information for orthopedics follow-up as needed.  Strict ED return precautions given.  All questions answered and patient stable for discharge.  Clinical Impression:  1. Closed nondisplaced fracture of proximal phalanx of lesser toe of left foot, initial encounter   2. Fall, initial encounter      Discharge           Final Clinical Impression(s) / ED Diagnoses Final diagnoses:  Closed nondisplaced fracture of proximal phalanx of lesser toe of left foot, initial encounter  Fall, initial encounter    Rx / DC Orders ED Discharge Orders          Ordered    HYDROcodone-acetaminophen (NORCO/VICODIN) 5-325 MG tablet  Every 6 hours PRN        09/23/22 2006    ibuprofen (ADVIL) 600 MG tablet  Every 8 hours PRN        09/23/22 2006              Kellie Shaffer 09/23/22 2223    Dorie Rank, MD 09/23/22 2343

## 2022-09-23 NOTE — ED Triage Notes (Signed)
[place] down her steps last Sunday and struck her foot between great toe and 2nd toe. Over the week, swelling and pain has suddenly gotten worse, especially in the last 2 days.  Not on blood thinners.  Gets depo shots

## 2022-09-28 ENCOUNTER — Telehealth: Payer: Self-pay

## 2022-09-28 NOTE — Transitions of Care (Post Inpatient/ED Visit) (Signed)
   09/28/2022  Name: Kellie Shaffer MRN: [id number] DOB: 12/18/1990  Today's TOC FU Call Status:    Attempted to reach the patient regarding the most recent Inpatient/ED visit.  Follow Up Plan: Additional outreach attempts will be made to reach the patient to complete the Transitions of Care (Post Inpatient/ED visit) call.   [place] RMA

## 2022-10-01 ENCOUNTER — Other Ambulatory Visit: Payer: Self-pay | Admitting: Nurse Practitioner

## 2022-10-02 ENCOUNTER — Ambulatory Visit: Payer: Managed Care, Other (non HMO) | Admitting: Nurse Practitioner

## 2022-10-02 ENCOUNTER — Encounter: Payer: Self-pay | Admitting: Nurse Practitioner

## 2022-10-02 VITALS — BP 119/66 | HR 72 | Temp 97.9°F | Ht 69.0 in | Wt 206.8 lb

## 2022-10-02 DIAGNOSIS — S92515D Nondisplaced fracture of proximal phalanx of left lesser toe(s), subsequent encounter for fracture with routine healing: Secondary | ICD-10-CM

## 2022-10-02 DIAGNOSIS — S92515A Nondisplaced fracture of proximal phalanx of left lesser toe(s), initial encounter for closed fracture: Secondary | ICD-10-CM | POA: Insufficient documentation

## 2022-10-02 NOTE — Progress Notes (Signed)
$@Patienti$  ID: Kellie Shaffer, female    DOB: 01/30/1991, 32 y.o.   MRN: [id number]  Chief Complaint  Patient presents with   Foot Injury    Second toe left foot     Referring provider: Fenton Foy, NP   HPI  Patient presents today for follow-up on fractured toe.  Patient was seen in the ED on 09/23/2022.  X-ray revealed fracture to left second toe.  Patient was placed in orthopedic shoe.  She states that her pain is much improved.  We discussed that she should be able to return back to work soon but she may wear her boot while at work. Denies f/c/s, n/v/d, hemoptysis, PND, leg swelling Denies chest pain or edema       Allergies  Allergen Reactions   Benadryl [Diphenhydramine Hcl] Anaphylaxis and Hives   Chloroxine Hives    Clorox    Immunization History  Administered Date(s) Administered   Influenza,inj,Quad PF,6+ Mos 10/04/2018, 06/21/2019   Pneumococcal-Unspecified 08/17/2009   Tdap 08/17/2010    Past Medical History:  Diagnosis Date   Abnormal Pap smear of cervix 2019   LSIL   Abortion May 2013   Anemia    Angio-edema    Anxiety    Diabetes mellitus without complication ([place])    Dizziness 11/22/2019   Hemoglobin A1c less than 7.0% 03/2020   History of ITP    History of palpitations    Cardiac event monitor June 2019: Normal.  Sinus rhythm no arrhythmias or significant PACs/PVCs.   Hypercholesteremia    Hyperglycemia 03/2020   Hypertension    Hyperthyroidism    Iron deficiency anemia 09/04/2016   Peripheral vertigo    Recurrent upper respiratory infection (URI)    Seasonal allergies 11/22/2019   Urticaria    Vertigo 11/22/2019   Vertigo     Tobacco History: Social History   Tobacco Use  Smoking Status Former   Packs/day: 0.25   Years: 10.00   Total pack years: 2.50   Types: Cigarettes   Start date: 2011   Quit date: 2021   Years since quitting: 3.1  Smokeless Tobacco Never   Counseling given: Not Answered   Outpatient Encounter  Medications as of 10/02/2022  Medication Sig   atorvastatin (LIPITOR) 40 MG tablet TAKE 1 TABLET(40 MG) BY MOUTH DAILY   Biotin 10000 MCG TABS Take 1,000 mcg by mouth daily.    busPIRone (BUSPAR) 10 MG tablet Take 1 tablet (10 mg total) by mouth daily.   cetirizine (ZYRTEC) 10 MG tablet Take 1 tablet (10 mg total) by mouth daily as needed for allergies.   CINNAMON PO Take by mouth.   ferrous sulfate (FEROSUL) 325 (65 FE) MG tablet TAKE 1 TABLET(325 MG) BY MOUTH TWICE DAILY WITH A MEAL   Ginger, Zingiber officinalis, (GINGER PO) Take by mouth.   lisinopril (ZESTRIL) 5 MG tablet Take 1 tablet (5 mg total) by mouth daily.   medroxyPROGESTERone (DEPO-PROVERA) 150 MG/ML injection ADMINISTER 1 ML IN THE MUSCLE 1 TIME FOR 1 DOSE   methimazole (TAPAZOLE) 5 MG tablet Take 1 tablet (5 mg total) by mouth daily.   metoprolol tartrate (LOPRESSOR) 25 MG tablet TAKE 1/2 TABLET BY MOUTH EVERY MORNING   Multiple Vitamins-Minerals (MULTIVITAMIN ADULT PO) Take by mouth daily.   omeprazole (PRILOSEC) 40 MG capsule Take 1 capsule (40 mg total) by mouth daily.   OVER THE COUNTER MEDICATION Beet Root supplement   POTASSIUM PO Take by mouth.   Probiotic Product (PROBIOTIC-10 PO) Take  by mouth. 1 caples   [DISCONTINUED] metoprolol tartrate (LOPRESSOR) 25 MG tablet TAKE ONE-HALF (1/2) TABLET EVERY MORNING   No facility-administered encounter medications on file as of 10/02/2022.     Review of Systems  Review of Systems  Constitutional: Negative.   HENT: Negative.    Cardiovascular: Negative.   Gastrointestinal: Negative.   Allergic/Immunologic: Negative.   Neurological: Negative.   Psychiatric/Behavioral: Negative.         Physical Exam  BP 119/66   Pulse 72   Temp 97.9 F (36.6 C)   Ht 5' 9"$  (1.753 m)   Wt 206 lb 12.8 oz (93.8 kg)   SpO2 100%   BMI 30.54 kg/m   Wt Readings from Last 5 Encounters:  10/02/22 206 lb 12.8 oz (93.8 kg)  09/23/22 187 lb 6.3 oz (85 kg)  08/26/22 205 lb (93 kg)   06/26/22 231 lb 4.2 oz (104.9 kg)  05/27/22 214 lb 7 oz (97.3 kg)     Physical Exam Vitals and nursing note reviewed.  Constitutional:      General: She is not in acute distress.    Appearance: She is well-developed.  Cardiovascular:     Rate and Rhythm: Normal rate and regular rhythm.  Pulmonary:     Effort: Pulmonary effort is normal.     Breath sounds: Normal breath sounds.  Neurological:     Mental Status: She is alert and oriented to person, place, and time.      Lab Results:  CBC    Component Value Date/Time   WBC 6.1 08/26/2022 0841   WBC 7.1 06/26/2022 1030   RBC 4.19 08/26/2022 0841   RBC 4.78 06/26/2022 1030   HGB 11.5 08/26/2022 0841   HGB 12.1 08/13/2017 1044   HCT 35.3 08/26/2022 0841   HCT 37.2 08/13/2017 1044   PLT 160 08/26/2022 0841   MCV 84 08/26/2022 0841   MCV 81.6 08/13/2017 1044   MCH 27.4 08/26/2022 0841   MCH 27.6 06/26/2022 1030   MCHC 32.6 08/26/2022 0841   MCHC 32.6 06/26/2022 1030   RDW 13.2 08/26/2022 0841   RDW 15.0 (H) 08/13/2017 1044   LYMPHSABS 2.1 06/26/2022 1030   LYMPHSABS 2.0 10/02/2020 1132   LYMPHSABS 3.0 08/13/2017 1044   MONOABS 0.5 06/26/2022 1030   MONOABS 0.8 08/13/2017 1044   EOSABS 0.1 06/26/2022 1030   EOSABS 0.2 10/02/2020 1132   BASOSABS 0.0 06/26/2022 1030   BASOSABS 0.0 10/02/2020 1132   BASOSABS 0.0 08/13/2017 1044    BMET    Component Value Date/Time   NA 138 08/26/2022 0841   NA 132 (L) 08/13/2017 1044   K 3.7 08/26/2022 0841   K 4.3 08/13/2017 1044   CL 104 08/26/2022 0841   CO2 21 08/26/2022 0841   CO2 25 08/13/2017 1044   GLUCOSE 133 (H) 08/26/2022 0841   GLUCOSE 177 (H) 06/26/2022 1030   GLUCOSE 672 (HH) 08/13/2017 1044   BUN 16 08/26/2022 0841   BUN 18.6 08/13/2017 1044   CREATININE 0.81 08/26/2022 0841   CREATININE 0.73 05/27/2022 0801   CREATININE 1.3 (H) 08/13/2017 1044   CALCIUM 9.4 08/26/2022 0841   CALCIUM 9.9 08/13/2017 1044   GFRNONAA >60 06/26/2022 1030   GFRNONAA >60  05/27/2022 0801   GFRAA 124 10/02/2020 1132   GFRAA >60 12/20/2019 1020     Assessment & Plan:   Closed nondisplaced fracture of proximal phalanx of lesser toe of left foot - continue to wear orthopedic shoe  Follow up:  Follow up in 3 months     Fenton Foy, NP 10/02/2022

## 2022-10-02 NOTE — Progress Notes (Deleted)
  Subjective:    Patient ID: Kellie Shaffer, female    DOB: [birth year], 32 y.o.   MRN: [id number]  Kellie Shaffer is a 32 y.o. female who presents for follow-up of Type 2 diabetes mellitus.  Patient {is/are not:32546} checking home blood sugars.   Home blood sugar records: {dm home sugars:14018} How often is blood sugars being checked: *** Current symptoms/problems include {Symptoms; diabetes:14075} and have been {Desc; course:15616}. Daily foot checks: ***   Any foot concerns: *** Last eye exam: *** Exercise: {types:19826}  The following portions of the patient's history were reviewed and updated as appropriate: allergies, current medications, past medical history, past social history and problem list.  ROS as in subjective above.     Objective:    Physical Exam Alert and in no distress otherwise not examined.  There were no vitals taken for this visit.  Lab Review    Latest Ref Rng & Units 08/26/2022    8:41 AM 06/26/2022   10:30 AM 05/27/2022    8:01 AM 11/26/2021    8:05 AM 07/07/2021    7:51 AM  Diabetic Labs  HbA1c 4.8 - 5.6 % 6.1       Chol 100 - 199 mg/dL 97       HDL >39 mg/dL 32       Calc LDL 0 - 99 mg/dL 50       Triglycerides 0 - 149 mg/dL 68       Creatinine 0.57 - 1.00 mg/dL 0.81  0.74  0.73  0.74  0.76       09/23/2022    7:15 PM 09/23/2022    7:02 PM 08/26/2022    8:09 AM 07/16/2022    7:57 AM 06/26/2022    3:00 PM  BP/Weight  Systolic BP  0000000 [id number] [id number] [id number]  Diastolic BP  99991111 79 78 76  Wt. (Lbs) 187.39  205    BMI 27.67 kg/m2  30.27 kg/m2        05/08/2021   10:00 AM  Foot/eye exam completion dates  Foot Form Completion Done    Kellie Shaffer  reports that she quit smoking about 3 years ago. Her smoking use included cigarettes. She started smoking about 13 years ago. She has a 2.50 pack-year smoking history. She has never used smokeless tobacco. She reports current alcohol use. She reports that she does not use drugs.     Assessment & Plan:     No diagnosis found.  Rx changes: {none:33079} Education: Reviewed 'ABCs' of diabetes management (respective goals in parentheses):  A1C (<7), blood pressure (<130/80), and cholesterol (LDL <100). Compliance at present is estimated to be {good/fair/poor:33178}. Efforts to improve compliance (if necessary) will be directed at {compliance:16716}. Follow up: {NUMBERS; 0-10:33138} {time:11}

## 2022-10-02 NOTE — Patient Instructions (Addendum)
1. Closed nondisplaced fracture of proximal phalanx of lesser toe of left foot with routine healing, subsequent encounter  - continue to wear orthopedic shoe  Follow up:  Follow up in 3 months     Toe Fracture A toe fracture is a break in one of the toe bones (phalanges). This may happen if you: Drop a heavy object on your toe. Stub your toe. Twist your toe. Exercise the same way too much. What are the signs or symptoms? The main symptoms are swelling and pain in the toe. You may also have: Bruising. Stiffness. Numbness. A change in the way the toe looks. Broken bones that poke through the skin. Blood under the toenail. How is this treated? Treatments may include: Taping the broken toe to a toe that is next to it (buddy taping). Wearing a shoe that has a wide, rigid sole to protect the toe and to limit its movement. Wearing a cast. Surgery. This may be needed if the: Pieces of broken bone are out of place. Bone pokes through the skin. Physical therapy. Follow these instructions at home: If you have a shoe: Wear the shoe as told by your doctor. Remove it only as told by your doctor. Loosen the shoe if your toes tingle, become numb, or turn cold and blue. Keep the shoe clean and dry. If you have a cast: Do not put pressure on any part of the cast until it is fully hardened. This may take a few hours. Do not stick anything inside the cast to scratch your skin. Check the skin around the cast every day. Tell your doctor about any concerns. You may put lotion on dry skin around the edges of the cast. Do not put lotion on the skin under the cast. Keep the cast clean and dry. Bathing Do not take baths, swim, or use a hot tub until your doctor says it is okay. Ask your doctor if you can take showers. If the shoe or cast is not waterproof: Do not let it get wet. Cover it with a watertight covering when you take a bath or a shower. Activity Do not use your foot to support  your body weight until your doctor says it is okay. Use crutches as told by your doctor. Ask your doctor what activities are safe for you during recovery. Avoid activities as told by your doctor. Do exercises as told by your doctor or therapist. Driving Do not drive or use heavy machinery while taking pain medicine. Do not drive while wearing a cast on a foot that you use for driving. Managing pain, stiffness, and swelling  Put ice on the injured area if told by your doctor: Put ice in a plastic bag. Place a towel between your skin and the bag. If you have a shoe, remove it as told by your doctor. If you have a cast, place a towel between your cast and the bag. Leave the ice on for 20 minutes, 2-3 times per day. Raise (elevate) the injured area above the level of your heart while you are sitting or lying down. General instructions If your toe was taped to a toe that is next to it, follow your doctor's instructions for changing the gauze and tape. Change it more often: If the gauze and tape get wet. If this happens, dry the space between the toes. If the gauze and tape are too tight and they cause your toe to become pale or to lose feeling (go numb). If your doctor  did not give you a protective shoe, wear sturdy shoes that support your foot. Your shoes should not: Pinch your toes. Fit tightly against your toes. Do not use any tobacco products, including cigarettes, chewing tobacco, or e-cigarettes. These can delay bone healing. If you need help quitting, ask your doctor. Take medicines only as told by your doctor. Keep all follow-up visits as told by your doctor. This is important. Contact a doctor if: Your pain medicine is not helping. You have a fever. You notice a bad smell coming from your cast. Get help right away if: You lose feeling (have numbness) in your toe or foot, and it is getting worse. Your toe or your foot tingles. Your toe or your foot gets cold or turns blue. You  have redness or swelling in your toe or foot, and it is getting worse. You have very bad pain. Summary A toe fracture is a break in one of the toe bones. Use ice and raise your foot. This will help lessen pain and swelling. Use crutches as told by your doctor. This information is not intended to replace advice given to you by your health care provider. Make sure you discuss any questions you have with your health care provider. Document Revised: 01/06/2021 Document Reviewed: 01/06/2021 Elsevier Patient Education  [place].

## 2022-10-02 NOTE — Assessment & Plan Note (Signed)
-   continue to wear orthopedic shoe  Follow up:  Follow up in 3 months

## 2022-10-06 ENCOUNTER — Encounter: Payer: Self-pay | Admitting: Internal Medicine

## 2022-10-07 ENCOUNTER — Ambulatory Visit: Payer: Managed Care, Other (non HMO) | Admitting: Internal Medicine

## 2022-10-07 ENCOUNTER — Other Ambulatory Visit: Payer: Self-pay | Admitting: Radiology

## 2022-10-07 ENCOUNTER — Other Ambulatory Visit: Payer: Self-pay

## 2022-10-07 ENCOUNTER — Encounter: Payer: Self-pay | Admitting: Internal Medicine

## 2022-10-07 VITALS — BP 140/82 | HR 68 | Ht 69.0 in | Wt 209.8 lb

## 2022-10-07 DIAGNOSIS — Z3042 Encounter for surveillance of injectable contraceptive: Secondary | ICD-10-CM

## 2022-10-07 DIAGNOSIS — I10 Essential (primary) hypertension: Secondary | ICD-10-CM

## 2022-10-07 DIAGNOSIS — E0590 Thyrotoxicosis, unspecified without thyrotoxic crisis or storm: Secondary | ICD-10-CM

## 2022-10-07 DIAGNOSIS — D508 Other iron deficiency anemias: Secondary | ICD-10-CM

## 2022-10-07 MED ORDER — MEDROXYPROGESTERONE ACETATE 150 MG/ML IM SUSP: INTRAMUSCULAR | 1 refills | Status: DC

## 2022-10-07 NOTE — Telephone Encounter (Signed)
Rx sent 

## 2022-10-07 NOTE — Progress Notes (Signed)
Patient ID: CHYRLE BOBIER, female   DOB: 1990-09-01, 32 y.o.   MRN: [id number]  HPI  Kellie Shaffer is a 32 y.o.-year-old female, returning for follow-up thyrotoxicosis and multinodular goiter.  She was previously seen by, last visit 11 months ago.  Patient was diagnosed with thyrotoxicosis in 2013 during pregnancy.  She was started on methimazole, but was lost for follow-up afterwards she was again found to be thyrotoxic in 2015 during spittle admission.  Methimazole was restarted and then.  She came off methimazole afterwards but this was restarted in 2018 and she continues on it now.  She takes 5 mg daily, decreased from 10 mg in 10/2021.  She fractured her foot recently >> has a boot on.  I reviewed pt's thyroid tests: Lab Results  Component Value Date   TSH 5.55 (H) 11/07/2021   TSH 3.96 05/08/2021   TSH 3.120 10/02/2020   TSH 2.33 04/04/2020   TSH 3.77 09/20/2019   TSH 2.56 04/19/2019   TSH 4.01 12/14/2018   TSH 2.18 09/13/2018   TSH 4.488 11/18/2017   TSH 4.56 (H) 10/14/2017   FREET4 0.82 11/07/2021   FREET4 1.47 05/08/2021   FREET4 1.31 04/04/2020   FREET4 0.86 09/20/2019   FREET4 1.49 04/19/2019   FREET4 0.99 12/14/2018   FREET4 0.97 09/13/2018   FREET4 0.89 10/14/2017   FREET4 1.06 05/21/2017   FREET4 1.00 02/18/2017   Antithyroid antibodies: No results found for: "TSI"  Thyroid U/S (05/22/2012): Right thyroid lobe:  6.2 x 2.5 x 2.6 cm.  Left thyroid lobe:  5.2 x 2.4 x 2.4 cm.  Isthmus:  1.1 cm in thickness.   Focal nodules:  The thyroid gland is diffusely heterogeneous in  echotexture with increased vascularity.  There are ill-defined  small solid nodules bilaterally measuring 1.9 x 1.4 x 1.2 cm in the  mid right lobe and 1.5 x 1.0 x 1.0 cm in the mid left lobe.  These  are quite ill-defined.  No dominant or otherwise suspicious nodule  is seen.   Lymphadenopathy:  None visualized.   IMPRESSION:  Diffusely heterogeneous thyroid tissue with  ill-defined nodularity  most consistent with multinodular goiter.   Pt denies: - feeling nodules in neck - hoarseness - dysphagia - choking  She denies: - fatigue - excessive sweating/heat intolerance - tremors - palpitations - weight loss  But has: - anxiety - not well controlled on Buspar  Pt does not have a FH of thyroid ds. No FH of thyroid cancer. No h/o radiation tx to head or neck. No seaweed or kelp, no recent contrast studies. No steroid use. + herbal supplements (beet root, ginger, cinnamon). + Biotin use - 10,000 mcg daily - last dose today.  Pt. also has a history of hyperlipidemia, iron deficiency anemia, anxiety, h/o ITP, Chiari I malformation. Also anxiety - on Buspar.  ROS:  + see HPI  Past Medical History:  Diagnosis Date   Abnormal Pap smear of cervix 2019   LSIL   Abortion May 2013   Anemia    Angio-edema    Anxiety    Diabetes mellitus without complication ([place])    Dizziness 11/22/2019   Hemoglobin A1c less than 7.0% 03/2020   History of ITP    History of palpitations    Cardiac event monitor June 2019: Normal.  Sinus rhythm no arrhythmias or significant PACs/PVCs.   Hypercholesteremia    Hyperglycemia 03/2020   Hypertension    Hyperthyroidism    Iron deficiency anemia 09/04/2016  Peripheral vertigo    Recurrent upper respiratory infection (URI)    Seasonal allergies 11/22/2019   Urticaria    Vertigo 11/22/2019   Vertigo    Past Surgical History:  Procedure Laterality Date   LAPAROSCOPIC APPENDECTOMY N/A 01/14/2017   Procedure: APPENDECTOMY LAPAROSCOPIC;  Surgeon: Fanny Skates, MD;  Location: [facility];  Service: General;  Laterality: N/A;   Social History   Socioeconomic History   Marital status: Single    Spouse name: Not on file   Number of children: Not on file   Years of education: 14   Highest education level: Not on file  Occupational History   Occupation: [employer]  Tobacco Use   Smoking status: Former    Packs/day:  0.25    Years: 10.00    Total pack years: 2.50    Types: Cigarettes    Start date: 2011    Quit date: 2021    Years since quitting: 3.1   Smokeless tobacco: Never  Vaping Use   Vaping Use: Never used  Substance and Sexual Activity   Alcohol use: Yes    Comment: 2 glasses of wine/month   Drug use: No   Sexual activity: Not Currently    Partners: Male    Birth control/protection: Condom, Injection  Other Topics Concern   Not on file  Social History Narrative   Regular exercise-yes   Caffeine Use-yes         Social Determinants of Health   Financial Resource Strain: Not on file  Food Insecurity: Not on file  Transportation Needs: Not on file  Physical Activity: Not on file  Stress: Not on file  Social Connections: Not on file  Intimate Partner Violence: Not on file   Current Outpatient Medications on File Prior to Visit  Medication Sig Dispense Refill   atorvastatin (LIPITOR) 40 MG tablet TAKE 1 TABLET(40 MG) BY MOUTH DAILY 30 tablet 0   Biotin 10000 MCG TABS Take 1,000 mcg by mouth daily.      busPIRone (BUSPAR) 10 MG tablet Take 1 tablet (10 mg total) by mouth daily. 30 tablet 2   cetirizine (ZYRTEC) 10 MG tablet Take 1 tablet (10 mg total) by mouth daily as needed for allergies. 90 tablet 1   CINNAMON PO Take by mouth.     ferrous sulfate (FEROSUL) 325 (65 FE) MG tablet TAKE 1 TABLET(325 MG) BY MOUTH TWICE DAILY WITH A MEAL 60 tablet 1   Ginger, Zingiber officinalis, (GINGER PO) Take by mouth.     lisinopril (ZESTRIL) 5 MG tablet Take 1 tablet (5 mg total) by mouth daily. 90 tablet 3   medroxyPROGESTERone (DEPO-PROVERA) 150 MG/ML injection ADMINISTER 1 ML IN THE MUSCLE 1 TIME FOR 1 DOSE 1 mL 1   methimazole (TAPAZOLE) 5 MG tablet Take 1 tablet (5 mg total) by mouth daily. 90 tablet 3   metoprolol tartrate (LOPRESSOR) 25 MG tablet TAKE 1/2 TABLET BY MOUTH EVERY MORNING 90 tablet 3   Multiple Vitamins-Minerals (MULTIVITAMIN ADULT PO) Take by mouth daily.     omeprazole  (PRILOSEC) 40 MG capsule Take 1 capsule (40 mg total) by mouth daily. 30 capsule 1   OVER THE COUNTER MEDICATION Beet Root supplement     POTASSIUM PO Take by mouth.     Probiotic Product (PROBIOTIC-10 PO) Take by mouth. 1 caples     No current facility-administered medications on file prior to visit.   Allergies  Allergen Reactions   Benadryl [Diphenhydramine Hcl] Anaphylaxis and Hives  Chloroxine Hives    Clorox   Family History  Problem Relation Age of Onset   Diabetes Other        Parent   Hypertension Other        Parent   Hyperlipidemia Other        Parent   Arthritis Other        Parent   Breast cancer Mother 72   PE: BP (!) 140/82 (BP Location: Left Arm, Patient Position: Sitting, Cuff Size: Normal)   Pulse 68   Ht 5' 9"$  (1.753 m)   Wt 209 lb 12.8 oz (95.2 kg)   SpO2 97%   BMI 30.98 kg/m  Wt Readings from Last 3 Encounters:  10/07/22 209 lb 12.8 oz (95.2 kg)  10/02/22 206 lb 12.8 oz (93.8 kg)  09/23/22 187 lb 6.3 oz (85 kg)   Constitutional: overweight, in NAD Eyes:  EOMI, + B symmetric exophthalmos, no lid lag, no stare ENT: no neck masses, no bruits, no cervical lymphadenopathy Cardiovascular: RRR, No MRG Respiratory: CTA B Musculoskeletal: no deformities Skin:no rashes Neurological: no tremor with outstretched hands  ASSESSMENT: 1. Thyrotoxicosis  2.  Multinodular goiter  PLAN:  1. Patient with a recently found low TSH, without thyrotoxic sxs: no weight loss, heat intolerance, hyperdefecation, palpitations, but has longstanding anxiety.  - she is on biotin high-dose for a long time, unclear which of the previous TSH levels were checked while she had this in the system - We discussed that possible causes of thyrotoxicosis are:  Graves ds   Thyroiditis toxic multinodular goiter/ toxic adenoma  -The suspicion was for Graves' disease and she was started on methimazole, which she was off and on, but currently taking 5 mg daily, dose decreased after a  TSH returned elevated 10/2021.  She did not have repeat labs afterwards. - I plan to re check the TSH, fT3 and fT4 and also add thyroid stimulating antibodies to screen for Graves' disease.  However, she is now on high-dose biotin, 10,000 mcg daily, with the last dose taken this morning.  We discussed that she needs to be off biotin for 2 weeks before we checked her thyroid tests, since this interferes with the testing. - If the TFTs are thyrotoxic upon recheck, we may need an uptake and scan to differentiate between the 3 above possible etiologies  - possible modalities of treatment for the above conditions, to include methimazole use, radioactive iodine ablation or (last resort) surgery. - we may need to repeat the thyroid ultrasound depending on the results of the uptake and scan (if a cold nodule is present) - I do not feel that we need to add beta blockers at this time, since she is not tachycardic or tremulous - no signs of Graves' ophthalmopathy: she does not have any double vision, blurry vision, eye pain, chemosis.  She does have mild exophthalmos, but this may not be related to Graves' disease - RTC in 6 months, but likely sooner for repeat labs  2.  Multinodular goiter -She denies neck compression symptoms -Reviewed her thyroid ultrasound from 2013 which showed a small multinodular goiter, with ill-defined nodules. -We can continue to follow her expectantly for now  Needs refills to be done through [facility].  Philemon Kingdom, MD PhD [facility]

## 2022-10-07 NOTE — Patient Instructions (Signed)
Please stop Biotin x 2 weeks, then return for labs.  You should have an endocrinology follow-up appointment in 6 months.

## 2022-10-12 MED ORDER — FERROUS SULFATE 325 (65 FE) MG PO TABS: ORAL_TABLET | ORAL | 1 refills | Status: DC

## 2022-10-12 MED ORDER — ATORVASTATIN CALCIUM 40 MG PO TABS: ORAL_TABLET | ORAL | 1 refills | Status: DC

## 2022-10-16 ENCOUNTER — Ambulatory Visit (INDEPENDENT_AMBULATORY_CARE_PROVIDER_SITE_OTHER): Payer: Managed Care, Other (non HMO) | Admitting: *Deleted

## 2022-10-16 DIAGNOSIS — Z3042 Encounter for surveillance of injectable contraceptive: Secondary | ICD-10-CM

## 2022-10-16 MED ORDER — MEDROXYPROGESTERONE ACETATE 150 MG/ML IM SUSY
150.0000 mg | PREFILLED_SYRINGE | Freq: Once | INTRAMUSCULAR | Status: AC
  Administered 2022-10-16: 150 mg via INTRAMUSCULAR

## 2022-10-21 ENCOUNTER — Other Ambulatory Visit (INDEPENDENT_AMBULATORY_CARE_PROVIDER_SITE_OTHER): Payer: Managed Care, Other (non HMO)

## 2022-10-21 DIAGNOSIS — E0590 Thyrotoxicosis, unspecified without thyrotoxic crisis or storm: Secondary | ICD-10-CM

## 2022-10-21 LAB — TSH: TSH: 4.49 u[IU]/mL (ref 0.35–5.50)

## 2022-10-21 LAB — T3, FREE: T3, Free: 3.2 pg/mL (ref 2.3–4.2)

## 2022-10-21 LAB — T4, FREE: Free T4: 0.95 ng/dL (ref 0.60–1.60)

## 2022-10-23 LAB — THYROID STIMULATING IMMUNOGLOBULIN: TSI: <89 % baseline (ref <140)

## 2022-10-23 MED ORDER — METHIMAZOLE 5 MG PO TABS: 5.0000 mg | ORAL_TABLET | ORAL | 3 refills | Status: DC

## 2022-10-27 ENCOUNTER — Other Ambulatory Visit: Payer: Self-pay | Admitting: Nurse Practitioner

## 2022-10-27 DIAGNOSIS — D508 Other iron deficiency anemias: Secondary | ICD-10-CM

## 2022-11-06 ENCOUNTER — Ambulatory Visit: Payer: Managed Care, Other (non HMO) | Admitting: Family Medicine

## 2022-11-06 ENCOUNTER — Encounter: Payer: Self-pay | Admitting: Family Medicine

## 2022-11-06 VITALS — BP 140/79 | HR 100 | Temp 97.0°F | Ht 69.0 in | Wt 207.0 lb

## 2022-11-06 DIAGNOSIS — W57XXXA Bitten or stung by nonvenomous insect and other nonvenomous arthropods, initial encounter: Secondary | ICD-10-CM

## 2022-11-06 DIAGNOSIS — S90561A Insect bite (nonvenomous), right ankle, initial encounter: Secondary | ICD-10-CM

## 2022-11-06 DIAGNOSIS — R112 Nausea with vomiting, unspecified: Secondary | ICD-10-CM

## 2022-11-06 DIAGNOSIS — R6510 Systemic inflammatory response syndrome (SIRS) of non-infectious origin without acute organ dysfunction: Secondary | ICD-10-CM

## 2022-11-06 MED ORDER — ONDANSETRON HCL 4 MG PO TABS: 4.0000 mg | ORAL_TABLET | Freq: Three times a day (TID) | ORAL | 0 refills | Status: DC | PRN

## 2022-11-06 MED ORDER — AMOXICILLIN-POT CLAVULANATE 875-125 MG PO TABS: 1.0000 | ORAL_TABLET | Freq: Two times a day (BID) | ORAL | 0 refills | Status: DC

## 2022-11-06 MED ORDER — PREDNISONE 20 MG PO TABS: 40.0000 mg | ORAL_TABLET | Freq: Every day | ORAL | 0 refills | Status: AC

## 2022-11-06 NOTE — Patient Instructions (Signed)
You should improve in 2-3 days if not contact a health care provider.

## 2022-11-06 NOTE — Progress Notes (Signed)
Acute Office Visit  Subjective:     Patient ID: Kellie Shaffer, female    DOB: 05/22/1991, 32 y.o.   MRN: [id number]  Chief Complaint  Patient presents with   Insect Bite    C/O bug bite on right leg, nausea, and diarrhea X2 days     CC: insect bite  32 year old female presents to clinic today for evaluation of bug bite.  Bite was to the right ankle.  But was not seen.  This occurred at work.  Over-the-counter Course of the next few hours patient developed some fever diarrhea nausea vomiting.  Diarrhea is ongoing and is mostly watery and large volume.  Nonbloody.  Nausea vomiting has improved.  Patient still feels feverish.  No drainage of the wound site.  There is some swelling of the right midfoot.  Range of motion is normal.  Patient vomitus is nonbloody nonbilious.  She denies having sick contacts with similar symptoms.  No recent medication changes.  Very mild stomach discomfort.  Generalized.    Review of Systems  All other systems reviewed and are negative.       Objective:    BP (!) 140/79 (BP Location: Left Arm, Patient Position: Sitting, Cuff Size: Normal)   Pulse 100   Temp (!) 97 F (36.1 C)   Ht 5\' 9"  (1.753 m)   Wt 207 lb (93.9 kg)   SpO2 99%   BMI 30.57 kg/m    Physical Exam Vitals and nursing note reviewed.  Constitutional:      General: She is not in acute distress.    Appearance: Normal appearance. She is ill-appearing.  HENT:     Head: Normocephalic and atraumatic.     Mouth/Throat:     Mouth: Mucous membranes are moist.     Pharynx: Oropharynx is clear.  Eyes:     Extraocular Movements: Extraocular movements intact.     Pupils: Pupils are equal, round, and reactive to light.  Cardiovascular:     Rate and Rhythm: Regular rhythm. Tachycardia present.     Heart sounds: No murmur heard.    No friction rub. No gallop.  Pulmonary:     Effort: Pulmonary effort is normal.     Breath sounds: Normal breath sounds.  Abdominal:      General: Abdomen is flat. Bowel sounds are normal. There is no distension.     Palpations: Abdomen is soft. There is no mass.     Tenderness: There is no abdominal tenderness. There is no guarding or rebound.  Skin:    General: Skin is warm.     Capillary Refill: Capillary refill takes less than 2 seconds.     Findings: No rash.  Neurological:     General: No focal deficit present.     Mental Status: She is alert and oriented to person, place, and time. Mental status is at baseline.  Psychiatric:        Mood and Affect: Mood normal.        Behavior: Behavior normal.     No results found for any visits on 11/06/22.      Assessment & Plan:   Problem List Items Addressed This Visit   None Visit Diagnoses     Insect bite of right ankle, initial encounter    -  Primary   Relevant Medications   predniSONE (DELTASONE) 20 MG tablet   SIRS (systemic inflammatory response syndrome) (HCC)       Relevant Medications   amoxicillin-clavulanate (AUGMENTIN) 875-125  MG tablet   ondansetron (ZOFRAN) 4 MG tablet   Nausea and vomiting, unspecified vomiting type           Meds ordered this encounter  Medications   amoxicillin-clavulanate (AUGMENTIN) 875-125 MG tablet    Sig: Take 1 tablet by mouth 2 (two) times daily.    Dispense:  20 tablet    Refill:  0   predniSONE (DELTASONE) 20 MG tablet    Sig: Take 2 tablets (40 mg total) by mouth daily with breakfast for 2 days.    Dispense:  4 tablet    Refill:  0   ondansetron (ZOFRAN) 4 MG tablet    Sig: Take 1 tablet (4 mg total) by mouth every 8 (eight) hours as needed for nausea or vomiting.    Dispense:  20 tablet    Refill:  0   Insect bite - Prednisone for possible allergic reaction with some systemic signs - Antibiotics as the site appears mildly infected, no drainage - Warned patient to look out for drainage and other signs of worsening infection and to see a provider if she is not improving in the next 48 to 72 hours  SIRS -  Patient does not appear septic - Did advise her to watch her fluid intake, she voiced understanding - Patient appears mild and should be amenable to home treatment - SIRS criteria include tachycardia and fever but this is a normal fever response  Nausea - Zofran prescribed - Discussed with patient that a virus is going around the community given patient's similar symptoms - Possibility patient could have a community GI bug and a fresh bug bite  Return if symptoms worsen or fail to improve.  Elwin Mocha, MD

## 2022-11-13 ENCOUNTER — Telehealth: Payer: Self-pay | Admitting: Internal Medicine

## 2022-11-13 NOTE — Telephone Encounter (Signed)
Rescheduled 04/10 appointments per provider pal, patient is notified.

## 2022-11-25 ENCOUNTER — Other Ambulatory Visit: Payer: Managed Care, Other (non HMO)

## 2022-11-25 ENCOUNTER — Ambulatory Visit: Payer: Managed Care, Other (non HMO) | Admitting: Internal Medicine

## 2022-12-03 ENCOUNTER — Inpatient Hospital Stay: Payer: Managed Care, Other (non HMO) | Admitting: Internal Medicine

## 2022-12-03 ENCOUNTER — Inpatient Hospital Stay: Payer: Managed Care, Other (non HMO) | Attending: Internal Medicine

## 2022-12-03 VITALS — BP 122/78 | HR 73 | Temp 98.6°F | Resp 14 | Wt 202.5 lb

## 2022-12-03 DIAGNOSIS — D696 Thrombocytopenia, unspecified: Secondary | ICD-10-CM

## 2022-12-03 DIAGNOSIS — D693 Immune thrombocytopenic purpura: Secondary | ICD-10-CM | POA: Insufficient documentation

## 2022-12-03 DIAGNOSIS — E0590 Thyrotoxicosis, unspecified without thyrotoxic crisis or storm: Secondary | ICD-10-CM | POA: Diagnosis not present

## 2022-12-03 LAB — CBC WITH DIFFERENTIAL (CANCER CENTER ONLY)
Abs Immature Granulocytes: 0 10*3/uL (ref 0.00–0.07)
Basophils Absolute: 0 10*3/uL (ref 0.0–0.1)
Basophils Relative: 0 %
Eosinophils Absolute: 0.1 10*3/uL (ref 0.0–0.5)
Eosinophils Relative: 2 %
HCT: 37.9 % (ref 36.0–46.0)
Hemoglobin: 12.6 g/dL (ref 12.0–15.0)
Immature Granulocytes: 0 %
Lymphocytes Relative: 38 %
Lymphs Abs: 2.4 10*3/uL (ref 0.7–4.0)
MCH: 29 pg (ref 26.0–34.0)
MCHC: 33.2 g/dL (ref 30.0–36.0)
MCV: 87.1 fL (ref 80.0–100.0)
Monocytes Absolute: 0.6 10*3/uL (ref 0.1–1.0)
Monocytes Relative: 9 %
Neutro Abs: 3.2 10*3/uL (ref 1.7–7.7)
Neutrophils Relative %: 51 %
Platelet Count: 193 10*3/uL (ref 150–400)
RBC: 4.35 MIL/uL (ref 3.87–5.11)
RDW: 13.5 % (ref 11.5–15.5)
WBC Count: 6.3 10*3/uL (ref 4.0–10.5)
nRBC: 0 % (ref 0.0–0.2)

## 2022-12-03 LAB — CMP (CANCER CENTER ONLY)
ALT: 26 U/L (ref 0–44)
AST: 14 U/L — ABNORMAL LOW (ref 15–41)
Albumin: 4.4 g/dL (ref 3.5–5.0)
Alkaline Phosphatase: 59 U/L (ref 38–126)
Anion gap: 8 (ref 5–15)
BUN: 12 mg/dL (ref 6–20)
CO2: 25 mmol/L (ref 22–32)
Calcium: 9.7 mg/dL (ref 8.9–10.3)
Chloride: 106 mmol/L (ref 98–111)
Creatinine: 0.76 mg/dL (ref 0.44–1.00)
GFR, Estimated: >60 mL/min (ref >60)
Glucose, Bld: 144 mg/dL — ABNORMAL HIGH (ref 70–99)
Potassium: 3.6 mmol/L (ref 3.5–5.1)
Sodium: 139 mmol/L (ref 135–145)
Total Bilirubin: 1.2 mg/dL (ref 0.3–1.2)
Total Protein: 7.8 g/dL (ref 6.5–8.1)

## 2022-12-03 LAB — LACTATE DEHYDROGENASE: LDH: 143 U/L (ref 98–192)

## 2022-12-03 NOTE — Progress Notes (Signed)
[facility] [phone]   [phone]  OFFICE PROGRESS NOTE  [name], NP 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  CURRENT THERAPY: Observation.  INTERVAL HISTORY: Kellie Shaffer 32 y.o. female returns to the clinic today for follow-up visit.  The patient is feeling fine today with no concerning complaints.  She is followed by Dr. Elvera Lennox for her hyperthyroidism.  The patient denied having any current chest pain, shortness of breath, cough or hemoptysis.  She has no nausea, vomiting, diarrhea or constipation.  She has no headache or visual changes.  She has no bleeding, bruises or ecchymosis.  She is here today for evaluation and repeat blood work.  MEDICAL HISTORY: Past Medical History:  Diagnosis Date   Abnormal Pap smear of cervix 2019   LSIL   Abortion May 2013   Anemia    Angio-edema    Anxiety    Diabetes mellitus without complication (HCC)    Dizziness 11/22/2019   Hemoglobin A1c less than 7.0% 03/2020   History of ITP    History of palpitations    Cardiac event monitor June 2019: Normal.  Sinus rhythm no arrhythmias or significant PACs/PVCs.   Hypercholesteremia    Hyperglycemia 03/2020   Hypertension    Hyperthyroidism    Iron deficiency anemia 09/04/2016   Peripheral vertigo    Recurrent upper respiratory infection (URI)    Seasonal allergies 11/22/2019   Urticaria    Vertigo 11/22/2019   Vertigo     ALLERGIES:  is allergic to benadryl [diphenhydramine hcl] and chloroxine.  MEDICATIONS:  Current Outpatient Medications  Medication Sig Dispense Refill   amoxicillin-clavulanate (AUGMENTIN) 875-125 MG tablet Take 1 tablet by mouth 2 (two) times daily. 20 tablet 0   atorvastatin (LIPITOR) 40 MG tablet TAKE 1 TABLET(40 MG) BY MOUTH DAILY 90 tablet 1    Biotin 81191 MCG TABS Take 1,000 mcg by mouth daily.      cetirizine (ZYRTEC) 10 MG tablet Take 1 tablet (10 mg total) by mouth daily as needed for allergies. 90 tablet 1   CINNAMON PO Take by mouth.     ferrous sulfate (FEROSUL) 325 (65 FE) MG tablet TAKE 1 TABLET(325 MG) BY MOUTH TWICE DAILY WITH A MEAL 60 tablet 0   Ginger, Zingiber officinalis, (GINGER PO) Take by mouth.     lisinopril (ZESTRIL) 5 MG tablet Take 1 tablet (5 mg total) by mouth daily. 90 tablet 3   medroxyPROGESTERone (DEPO-PROVERA) 150 MG/ML injection ADMINISTER 1 ML IN THE MUSCLE 1 TIME FOR 1 DOSE 1 mL 1   methimazole (TAPAZOLE) 5 MG tablet Take 1 tablet (5 mg total) by mouth every other day. 45 tablet 3   metoprolol tartrate (LOPRESSOR) 25 MG tablet TAKE 1/2 TABLET BY MOUTH EVERY MORNING 90 tablet 3   Multiple Vitamins-Minerals (MULTIVITAMIN ADULT PO) Take by mouth daily.     omeprazole (PRILOSEC) 40 MG capsule Take 1 capsule (40 mg total) by mouth daily. 30 capsule 1   ondansetron (ZOFRAN) 4 MG tablet Take 1 tablet (4 mg total) by mouth every 8 (eight) hours as needed for nausea or vomiting. 20 tablet 0   OVER THE COUNTER MEDICATION Beet Root supplement     POTASSIUM PO Take by mouth.     Probiotic Product (PROBIOTIC-10 PO) Take by mouth. 1 caples  No current facility-administered medications for this visit.    SURGICAL HISTORY:  Past Surgical History:  Procedure Laterality Date   LAPAROSCOPIC APPENDECTOMY N/A 01/14/2017   Procedure: APPENDECTOMY LAPAROSCOPIC;  Surgeon: Claud Kelp, MD;  Location: [facility];  Service: General;  Laterality: N/A;    REVIEW OF SYSTEMS:  A comprehensive review of systems was negative.   PHYSICAL EXAMINATION: General appearance: alert, cooperative, appears stated age, and no distress Head: Normocephalic, without obvious abnormality, atraumatic Neck: no adenopathy, no JVD, supple, symmetrical, trachea midline, and thyroid not enlarged, symmetric, no tenderness/mass/nodules Lymph  nodes: Cervical, supraclavicular, and axillary nodes normal. Resp: clear to auscultation bilaterally Back: symmetric, no curvature. ROM normal. No CVA tenderness. Cardio: regular rate and rhythm, S1, S2 normal, no murmur, click, rub or gallop GI: soft, non-tender; bowel sounds normal; no masses,  no organomegaly Extremities: extremities normal, atraumatic, no cyanosis or edema  ECOG PERFORMANCE STATUS: 0 - Asymptomatic  Blood pressure 122/78, pulse 73, temperature 98.6 F (37 C), temperature source Oral, resp. rate 14, weight 202 lb 8 oz (91.9 kg), SpO2 100 %.  LABORATORY DATA: Lab Results  Component Value Date   WBC 6.1 08/26/2022   HGB 11.5 08/26/2022   HCT 35.3 08/26/2022   MCV 84 08/26/2022   PLT 160 08/26/2022      Chemistry      Component Value Date/Time   NA 138 08/26/2022 0841   NA 132 (L) 08/13/2017 1044   K 3.7 08/26/2022 0841   K 4.3 08/13/2017 1044   CL 104 08/26/2022 0841   CO2 21 08/26/2022 0841   CO2 25 08/13/2017 1044   BUN 16 08/26/2022 0841   BUN 18.6 08/13/2017 1044   CREATININE 0.81 08/26/2022 0841   CREATININE 0.73 05/27/2022 0801   CREATININE 1.3 (H) 08/13/2017 1044   GLU 517 (H) 08/13/2017 1257      Component Value Date/Time   CALCIUM 9.4 08/26/2022 0841   CALCIUM 9.9 08/13/2017 1044   ALKPHOS 58 08/26/2022 0841   ALKPHOS 100 08/13/2017 1044   AST 14 08/26/2022 0841   AST 15 05/27/2022 0801   AST 5 08/13/2017 1044   ALT 27 08/26/2022 0841   ALT 32 05/27/2022 0801   ALT 15 08/13/2017 1044   BILITOT 0.7 08/26/2022 0841   BILITOT 0.7 05/27/2022 0801   BILITOT 0.52 08/13/2017 1044       RADIOGRAPHIC STUDIES: No results found.  ASSESSMENT AND PLAN: This is a very pleasant 32 years old African-American female with: 1) idiopathic thrombocytopenic purpura: She was treated a few months ago with a taper dose of prednisone because of the significant thrombocytopenia.  The patient was treated in the past with steroids and has been doing  fine. The patient is feeling fine today with no concerning complaints. Repeat CBC today showed no concerning abnormalities. I recommended for her to continue on observation with repeat blood work in 6 months. 2) For the hyperthyroidism, she is currently on methimazole and followed by endocrinology.  The patient voices understanding of current disease status and treatment options and is in agreement with the current care plan. All questions were answered. The patient knows to call the clinic with any problems, questions or concerns. We can certainly see the patient much sooner if necessary.  Disclaimer: This note was dictated with voice recognition software. Similar sounding words can inadvertently be transcribed and may not be corrected upon review.

## 2022-12-08 ENCOUNTER — Telehealth: Payer: Self-pay | Admitting: Nurse Practitioner

## 2022-12-08 NOTE — Telephone Encounter (Signed)
Caller & Relationship to patient:  MRN #  [id number]   Call Back Number:   Date of Last Office Visit: 10/27/2022     Date of Next Office Visit: 01/01/2023    Medication(s) to be Refilled: Bursprrone, methimazole (Pharmacy asking for new prescription)  Preferred Pharmacy:   ** Please notify patient to allow 48-72 hours to process** **Let patient know to contact pharmacy at the end of the day to make sure medication is ready. ** **If patient has not been seen in a year or longer, book an appointment **Advise to use MyChart for refill requests OR to contact their pharmacy

## 2022-12-10 ENCOUNTER — Other Ambulatory Visit: Payer: Self-pay | Admitting: Nurse Practitioner

## 2022-12-14 ENCOUNTER — Other Ambulatory Visit: Payer: Self-pay | Admitting: Nurse Practitioner

## 2022-12-14 MED ORDER — BUSPIRONE HCL 10 MG PO TABS: 10.0000 mg | ORAL_TABLET | Freq: Two times a day (BID) | ORAL | 0 refills | Status: DC

## 2022-12-14 NOTE — Telephone Encounter (Signed)
  DispRefillsStartEnd busPIRone (BUSPAR) 10 MG tablet30 tablet21/10/20244/9/2024Sig - Route: Take 1 tablet (10 mg total) by mouth daily. - OralSent to pharmacy as: busPIRone (BUSPAR) 10 MG tabletE-Prescribing Status: Receipt confirmed by pharmacy (08/26/2022  8:30 AM EST)   This is the last time you filled it. Please advise [facility]

## 2022-12-15 ENCOUNTER — Other Ambulatory Visit: Payer: Self-pay

## 2022-12-15 DIAGNOSIS — E0590 Thyrotoxicosis, unspecified without thyrotoxic crisis or storm: Secondary | ICD-10-CM

## 2022-12-15 MED ORDER — METHIMAZOLE 5 MG PO TABS: 5.0000 mg | ORAL_TABLET | ORAL | 3 refills | Status: DC

## 2022-12-17 ENCOUNTER — Other Ambulatory Visit: Payer: Self-pay

## 2022-12-17 MED ORDER — BUSPIRONE HCL 10 MG PO TABS: 10.0000 mg | ORAL_TABLET | Freq: Two times a day (BID) | ORAL | 0 refills | Status: AC

## 2022-12-17 NOTE — Telephone Encounter (Signed)
Please advise [facility] 

## 2022-12-20 ENCOUNTER — Other Ambulatory Visit: Payer: Self-pay | Admitting: Nurse Practitioner

## 2022-12-20 DIAGNOSIS — I10 Essential (primary) hypertension: Secondary | ICD-10-CM

## 2022-12-25 ENCOUNTER — Other Ambulatory Visit: Payer: Self-pay

## 2022-12-25 MED ORDER — METOPROLOL TARTRATE 25 MG PO TABS: ORAL_TABLET | ORAL | 1 refills | Status: DC

## 2023-01-01 ENCOUNTER — Ambulatory Visit: Payer: Managed Care, Other (non HMO) | Admitting: Nurse Practitioner

## 2023-01-01 ENCOUNTER — Ambulatory Visit (INDEPENDENT_AMBULATORY_CARE_PROVIDER_SITE_OTHER): Payer: Managed Care, Other (non HMO)

## 2023-01-01 ENCOUNTER — Ambulatory Visit: Payer: Self-pay | Admitting: Nurse Practitioner

## 2023-01-01 DIAGNOSIS — Z3042 Encounter for surveillance of injectable contraceptive: Secondary | ICD-10-CM

## 2023-01-01 MED ORDER — MEDROXYPROGESTERONE ACETATE 150 MG/ML IM SUSY
150.0000 mg | PREFILLED_SYRINGE | Freq: Once | INTRAMUSCULAR | Status: AC
  Administered 2023-01-01: 150 mg via INTRAMUSCULAR

## 2023-01-07 ENCOUNTER — Other Ambulatory Visit: Payer: Self-pay

## 2023-01-07 DIAGNOSIS — I10 Essential (primary) hypertension: Secondary | ICD-10-CM

## 2023-01-07 MED ORDER — LISINOPRIL 5 MG PO TABS: ORAL_TABLET | ORAL | 0 refills | Status: DC

## 2023-01-08 ENCOUNTER — Ambulatory Visit: Payer: Managed Care, Other (non HMO) | Admitting: Nurse Practitioner

## 2023-02-13 ENCOUNTER — Other Ambulatory Visit: Payer: Self-pay | Admitting: Nurse Practitioner

## 2023-02-15 NOTE — Telephone Encounter (Signed)
Please advise [facility] 

## 2023-02-24 ENCOUNTER — Ambulatory Visit: Payer: Self-pay | Admitting: Nurse Practitioner

## 2023-03-16 ENCOUNTER — Other Ambulatory Visit: Payer: Self-pay

## 2023-03-16 DIAGNOSIS — Z3042 Encounter for surveillance of injectable contraceptive: Secondary | ICD-10-CM

## 2023-03-16 MED ORDER — MEDROXYPROGESTERONE ACETATE 150 MG/ML IM SUSP: 150.0000 mg | Freq: Once | INTRAMUSCULAR | 0 refills | Status: AC

## 2023-03-16 MED ORDER — MEDROXYPROGESTERONE ACETATE 150 MG/ML IM SUSP: INTRAMUSCULAR | 0 refills | Status: DC

## 2023-03-16 NOTE — Addendum Note (Signed)
Addended by: Leda Min on: 03/16/2023 01:52 PM   Modules accepted: Orders

## 2023-03-16 NOTE — Telephone Encounter (Signed)
Med refill request:Depo-Provera 150mg /ml Last AEX: 02/24/22 -JC Next AEX: 03/24/23 -JC Last MMG (if hormonal med) N/A  Last depo received 01/01/23 Next due 8/2 -8/16  Routing to Jami to review and advise.  Refill authorized: Please Advise?

## 2023-03-16 NOTE — Telephone Encounter (Signed)
JC pt LVM in triage line stating pharmacy reported no more refills left of depo provera injection.

## 2023-03-16 NOTE — Telephone Encounter (Signed)
Spoke with patient. Advised Rx sent to [facility]. Patient states she no longer has insurance, requesting OOP cost for AEX with patient supplied medication.   Rx cancelled. Request Rx to [facility] on file.   Advised I will have front office contact patient to update insurance and review OOP cost. Patient states she does not plan to have insurance coverage for some time. Also reviewed alternative options for [facility], if she chooses.   Patient states she will wait to hear back from front office.   Patient is asking if Depo-provera can be sent to the pharmacy for her to give at home?  Advised our office does not prescribe to be given at home, explained IM injection must be given correctly and documented. Advised may review Depo-SubQ option with provider, this may be given at home. Advised this Rx may be more expensive.   Patient is going to review options and provide update upon return call from business office.  Message to front office to return call.   Spoke with [employer] at [facility], Rx cancelled.   Rx pended for Depo Provera at [facility], will await patient response.   Routing to [employer].

## 2023-03-16 NOTE — Telephone Encounter (Signed)
See refill encounter dated 03/16/23.   Encounter closed.

## 2023-03-18 ENCOUNTER — Telehealth: Payer: Self-pay | Admitting: Internal Medicine

## 2023-03-18 DIAGNOSIS — E0590 Thyrotoxicosis, unspecified without thyrotoxic crisis or storm: Secondary | ICD-10-CM

## 2023-03-18 MED ORDER — METHIMAZOLE 5 MG PO TABS: 5.0000 mg | ORAL_TABLET | ORAL | 3 refills | Status: DC

## 2023-03-18 NOTE — Telephone Encounter (Signed)
Patient needing a refill called in to [facility] on [address] Methimazole 5mg  tablets

## 2023-03-18 NOTE — Telephone Encounter (Signed)
Refill sent,

## 2023-03-20 ENCOUNTER — Other Ambulatory Visit: Payer: Self-pay | Admitting: Nurse Practitioner

## 2023-03-20 DIAGNOSIS — I10 Essential (primary) hypertension: Secondary | ICD-10-CM

## 2023-03-24 ENCOUNTER — Encounter: Payer: Self-pay | Admitting: Radiology

## 2023-03-24 ENCOUNTER — Other Ambulatory Visit (HOSPITAL_COMMUNITY)
Admission: RE | Admit: 2023-03-24 | Discharge: 2023-03-24 | Disposition: A | Discharge status: Home / Self Care | Payer: Commercial Managed Care - PPO | Source: Ambulatory Visit | Attending: Radiology | Admitting: Radiology

## 2023-03-24 ENCOUNTER — Ambulatory Visit (INDEPENDENT_AMBULATORY_CARE_PROVIDER_SITE_OTHER): Payer: Self-pay | Admitting: Radiology

## 2023-03-24 VITALS — BP 126/82 | Ht 69.0 in | Wt 212.0 lb

## 2023-03-24 DIAGNOSIS — Z01419 Encounter for gynecological examination (general) (routine) without abnormal findings: Secondary | ICD-10-CM | POA: Insufficient documentation

## 2023-03-24 DIAGNOSIS — Z113 Encounter for screening for infections with a predominantly sexual mode of transmission: Secondary | ICD-10-CM

## 2023-03-24 DIAGNOSIS — Z3042 Encounter for surveillance of injectable contraceptive: Secondary | ICD-10-CM

## 2023-03-24 DIAGNOSIS — R87810 Cervical high risk human papillomavirus (HPV) DNA test positive: Secondary | ICD-10-CM | POA: Insufficient documentation

## 2023-03-24 DIAGNOSIS — R87610 Atypical squamous cells of undetermined significance on cytologic smear of cervix (ASC-US): Secondary | ICD-10-CM | POA: Insufficient documentation

## 2023-03-24 MED ORDER — MEDROXYPROGESTERONE ACETATE 150 MG/ML IM SUSY
150.0000 mg | PREFILLED_SYRINGE | Freq: Once | INTRAMUSCULAR | Status: AC
  Administered 2023-03-24: 150 mg via INTRAMUSCULAR

## 2023-03-24 NOTE — Progress Notes (Signed)
   Kellie Shaffer 08-01-1991 [id number]   History:  32 y.o. G1P0 presents for annual exam. Happy with depo for contraception. No GYN concerns, would like STI screen with pap. 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 Patient has had an injection.   Contraception/Family planning: Depo-Provera injections Sexually active: yes Last Pap: 2023. Results were: abnormal, ASCUS HPV+, hx of CIN III   Obstetric History OB History  Gravida Para Term Preterm AB Living  1       1    SAB IAB Ectopic Multiple Live Births    1          # Outcome Date GA Lbr Len/2nd Weight Sex Type Anes PTL Lv  1 IAB 2013             The following portions of the patient's history were reviewed and updated as appropriate: allergies, current medications, past family history, past medical history, past social history, past surgical history, and problem list.  Review of Systems Pertinent items noted in HPI and remainder of comprehensive ROS otherwise negative.   Past medical history, past surgical history, family history and social history were all reviewed and documented in the EPIC chart.   Exam:  Vitals:   03/24/23 0808  BP: 126/82  Weight: 212 lb (96.2 kg)  Height: 5\' 9"  (1.753 m)   Body mass index is 31.31 kg/m.  General appearance:  Normal Thyroid:  Symmetrical, normal in size, without palpable masses or nodularity. Respiratory  Auscultation:  Clear without wheezing or rhonchi Cardiovascular  Auscultation:  Regular rate, without rubs, murmurs or gallops  Edema/varicosities:  Not grossly evident Abdominal  Soft,nontender, without masses, guarding or rebound.  Liver/spleen:  No organomegaly noted  Hernia:  None appreciated  Skin  Inspection:  Grossly normal Breasts: Examined lying and sitting.   Right: Without masses, retractions, nipple discharge or axillary adenopathy.   Left: Without masses, retractions, nipple discharge or axillary  adenopathy. Genitourinary   Inguinal/mons:  Normal without inguinal adenopathy  External genitalia:  Normal appearing vulva with no masses, tenderness, or lesions  BUS/Urethra/Skene's glands:  Normal without masses or exudate  Vagina:  Normal appearing with normal color and discharge, no lesions  Cervix:  Normal appearing without discharge or lesions  Uterus:  Normal in size, shape and contour.  Mobile, nontender  Adnexa/parametria:     Rt: Normal in size, without masses or tenderness.   Lt: Normal in size, without masses or tenderness.  Anus and perineum: Normal   Patient informed chaperone available to be present for breast and pelvic exam. Patient has requested no chaperone to be present. Patient has been advised what will be completed during breast and pelvic exam.   Assessment/Plan:   1. Well woman exam with routine gynecological exam - Cytology - PAP( [place])  2. Atypical squamous cell changes of undetermined significance (ASCUS) on cervical cytology with positive high risk human papilloma virus (HPV) - Cytology - PAP( [place])  3. Encounter for surveillance of injectable contraceptive - medroxyPROGESTERone Acetate SUSY 150 mg  4. Screening for STDs (sexually transmitted diseases) - Cytology - PAP( [place])   Discussed SBE, pap and STI screening as directed/appropriate. Recommend of exercise weekly, including weight bearing exercise. Encouraged the use of seatbelts and sunscreen. Return in 1 year for annual or as needed.   Arlie Solomons B WHNP-BC 8:14 AM 03/24/2023

## 2023-03-31 ENCOUNTER — Other Ambulatory Visit: Payer: Self-pay

## 2023-03-31 DIAGNOSIS — R87612 Low grade squamous intraepithelial lesion on cytologic smear of cervix (LGSIL): Secondary | ICD-10-CM

## 2023-04-07 ENCOUNTER — Ambulatory Visit: Payer: Managed Care, Other (non HMO) | Admitting: Internal Medicine

## 2023-04-07 ENCOUNTER — Telehealth: Payer: Self-pay

## 2023-04-07 NOTE — Progress Notes (Deleted)
Patient ID: Kellie Shaffer, female   DOB: 1991-01-23, 32 y.o.   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  She was previously seen by Dr. Everardo All, but last visit with me was 6 months ago.  Interim history:  Reviewed history: Patient was diagnosed with thyrotoxicosis in 2013 during pregnancy.  She was started on methimazole, but was lost for follow-up afterwards she was again found to be thyrotoxic in 2015 during a hospital admission.  Methimazole was restarted and then.  She came off methimazole afterwards but this was restarted in 2018.  She takes 5 mg daily, decreased from 10 mg in 10/2021.  I reviewed pt's thyroid tests: Lab Results  Component Value Date   TSH 4.49 10/21/2022   TSH 5.55 (H) 11/07/2021   TSH 3.96 05/08/2021   TSH 3.120 10/02/2020   TSH 2.33 04/04/2020   TSH 3.77 09/20/2019   TSH 2.56 04/19/2019   TSH 4.01 12/14/2018   TSH 2.18 09/13/2018   TSH 4.488 11/18/2017   FREET4 0.95 10/21/2022   FREET4 0.82 11/07/2021   FREET4 1.47 05/08/2021   FREET4 1.31 04/04/2020   FREET4 0.86 09/20/2019   FREET4 1.49 04/19/2019   FREET4 0.99 12/14/2018   FREET4 0.97 09/13/2018   FREET4 0.89 10/14/2017   FREET4 1.06 05/21/2017   T3FREE 3.2 10/21/2022   Antithyroid antibodies: Lab Results  Component Value Date   TSI <89 10/21/2022   Thyroid U/S (05/22/2012): Right thyroid lobe:  6.2 x 2.5 x 2.6 cm.  Left thyroid lobe:  5.2 x 2.4 x 2.4 cm.  Isthmus:  1.1 cm in thickness.   Focal nodules:  The thyroid gland is diffusely heterogeneous in  echotexture with increased vascularity.  There are ill-defined  small solid nodules bilaterally measuring 1.9 x 1.4 x 1.2 cm in the  mid right lobe and 1.5 x 1.0 x 1.0 cm in the mid left lobe.  These  are quite ill-defined.  No dominant or otherwise suspicious nodule  is seen.   Lymphadenopathy:  None visualized.   IMPRESSION:  Diffusely  heterogeneous thyroid tissue with ill-defined nodularity  most consistent with multinodular goiter.   Pt denies: - feeling nodules in neck - hoarseness - dysphagia - choking  She denies: - fatigue - excessive sweating/heat intolerance - tremors - palpitations - weight loss  But has: - anxiety - not well controlled on Buspar  Pt does not have a FH of thyroid ds. No FH of thyroid cancer. No h/o radiation tx to head or neck. No seaweed or kelp, no recent contrast studies. No steroid use. + herbal supplements (beet root, ginger, cinnamon). + Biotin use - 10,000 mcg daily - last dose today.  Pt. also has a history of hyperlipidemia, iron deficiency anemia, anxiety, h/o ITP, Chiari I malformation. Also anxiety - on Buspar.  ROS:  + see HPI  Past Medical History:  Diagnosis Date   Abnormal Pap smear of cervix 2019   LSIL   Abortion May 2013   Anemia    Angio-edema    Anxiety    Diabetes mellitus without complication (HCC)    Dizziness 11/22/2019   Hemoglobin A1c less than 7.0% 03/2020   History of ITP    History of palpitations    Cardiac event monitor June 2019: Normal.  Sinus rhythm no arrhythmias or significant PACs/PVCs.   Hypercholesteremia    Hyperglycemia 03/2020   Hypertension    Hyperthyroidism  Iron deficiency anemia 09/04/2016   Peripheral vertigo    Recurrent upper respiratory infection (URI)    Seasonal allergies 11/22/2019   Urticaria    Vertigo 11/22/2019   Vertigo    Past Surgical History:  Procedure Laterality Date   LAPAROSCOPIC APPENDECTOMY N/A 01/14/2017   Procedure: APPENDECTOMY LAPAROSCOPIC;  Surgeon: Claud Kelp, MD;  Location: [facility];  Service: General;  Laterality: N/A;   Social History   Socioeconomic History   Marital status: Single    Spouse name: Not on file   Number of children: Not on file   Years of education: 14   Highest education level: Not on file  Occupational History   Occupation: [employer]  Tobacco Use    Smoking status: Former    Current packs/day: 0.00    Average packs/day: 0.3 packs/day for 10.0 years (2.5 ttl pk-yrs)    Types: Cigarettes    Start date: 2011    Quit date: 2021    Years since quitting: 3.6   Smokeless tobacco: Never  Vaping Use   Vaping status: Never Used  Substance and Sexual Activity   Alcohol use: Yes    Comment: 2 glasses of wine/month   Drug use: No   Sexual activity: Yes    Partners: Male    Birth control/protection: Condom, Injection    Comment: menarche 32yo, sexual debut 32yo  Other Topics Concern   Not on file  Social History Narrative   Regular exercise-yes   Caffeine Use-yes         Social Determinants of Health   Financial Resource Strain: Not on file  Food Insecurity: Not on file  Transportation Needs: Not on file  Physical Activity: Not on file  Stress: Not on file  Social Connections: Not on file  Intimate Partner Violence: Not on file   Current Outpatient Medications on File Prior to Visit  Medication Sig Dispense Refill   atorvastatin (LIPITOR) 40 MG tablet TAKE 1 TABLET(40 MG) BY MOUTH DAILY 90 tablet 1   Biotin 95284 MCG TABS Take 1,000 mcg by mouth daily.      busPIRone (BUSPAR) 10 MG tablet TAKE 1 TABLET TWICE A DAY 60 tablet 11   cetirizine (ZYRTEC) 10 MG tablet Take 1 tablet (10 mg total) by mouth daily as needed for allergies. 90 tablet 1   CINNAMON PO Take by mouth.     ferrous sulfate (FEROSUL) 325 (65 FE) MG tablet TAKE 1 TABLET(325 MG) BY MOUTH TWICE DAILY WITH A MEAL 60 tablet 0   Ginger, Zingiber officinalis, (GINGER PO) Take by mouth.     lisinopril (ZESTRIL) 5 MG tablet TAKE 1 TABLET(5 MG) BY MOUTH DAILY 90 tablet 0   medroxyPROGESTERone (DEPO-PROVERA) 150 MG/ML injection Inject 1 mL (150 mg total) into the muscle once for 1 dose. 1 mL 0   methimazole (TAPAZOLE) 5 MG tablet Take 1 tablet (5 mg total) by mouth every other day. 45 tablet 3   metoprolol tartrate (LOPRESSOR) 25 MG tablet TAKE 1/2 TABLET BY MOUTH EVERY  MORNING 90 tablet 1   Multiple Vitamins-Minerals (MULTIVITAMIN ADULT PO) Take by mouth daily.     omeprazole (PRILOSEC) 40 MG capsule Take 1 capsule (40 mg total) by mouth daily. 30 capsule 1   OVER THE COUNTER MEDICATION Beet Root supplement     POTASSIUM PO Take by mouth.     Probiotic Product (PROBIOTIC-10 PO) Take by mouth. 1 caples     No current facility-administered medications on file prior to visit.  Allergies  Allergen Reactions   Benadryl [Diphenhydramine Hcl] Anaphylaxis and Hives   Chloroxine Hives    Clorox   Family History  Problem Relation Age of Onset   Diabetes Other        Parent   Hypertension Other        Parent   Hyperlipidemia Other        Parent   Arthritis Other        Parent   Breast cancer Mother 49   PE: There were no vitals taken for this visit. Wt Readings from Last 3 Encounters:  03/24/23 212 lb (96.2 kg)  12/03/22 202 lb 8 oz (91.9 kg)  11/06/22 207 lb (93.9 kg)   Constitutional: overweight, in NAD Eyes:  EOMI, + B symmetric exophthalmos, no lid lag, no stare ENT: no neck masses, no cervical lymphadenopathy Cardiovascular: RRR, No MRG Respiratory: CTA B Musculoskeletal: no deformities Skin:no rashes Neurological: no tremor with outstretched hands  ASSESSMENT: 1. Thyrotoxicosis  2.  Multinodular goiter  PLAN:  1. Patient with a history of low TSH, without thyrotoxic symptoms: No weight loss, heat intolerance, hyperdefecation, palpitations, but with longstanding anxiety -She has been on biotin high-dose for a long time so it was unclear whether her TSH levels were accurate in the past.  However, I advised her to stay off biotin at the time of her thyroid test checks. -We discussed the possible causes of thyrotoxicosis are Graves' disease, thyroiditis, or toxic multinodular goiter/toxic adenoma -She had a suspicion for Graves' disease and she was started on methimazole, which she was taking off and on but at last visit she was on 5 mg  daily -At last visit, she had normal TFTs and also TSI antibodies.  We discussed that she can still have occasional mild Graves' disease. -We continued the same dose of methimazole at last visit.  She tolerates this well. -We discussed about possible treatment for Graves' disease to include continuing methimazole use, RAI treatment or, last resort, surgery.  Since she is responding well to methimazole, we continued the thionamides. -No need for beta-blockers since she is not Tachycardic or tremulous -She has no signs of active Graves' ophthalmopathy: No double vision, blurry vision, eye pain, chemosis.  She has mild exophthalmos but this may not be related to Graves' disease. -Today's visit we will recheck her TFTs and change the methimazole dose accordingly - RTC in 6 months but possibly sooner for labs  2.  Multinodular goiter -No neck compression symptoms or masses felt on palpation of her neck today -Thyroid ultrasound from 2013 showed a small multinodular goiter, with ill-defined nodules -We can continue to follow her expectantly for now  Needs refills to be done through [facility].  Carlus Pavlov, MD PhD [facility] Endocrinology

## 2023-04-07 NOTE — Telephone Encounter (Signed)
Attempted to call patient and mailbox full. BCCCP (referral)

## 2023-04-08 ENCOUNTER — Telehealth: Payer: Self-pay

## 2023-04-08 NOTE — Telephone Encounter (Signed)
Per result note from 03/24/2023:  Pt desired to know an estimated OOP cost for recommended upcoming testing/procedure scheduled for 05/11/2023.   She was advised to get the [facility] costs for the visit from appt desk.  However, reached out to lab for pt on 04/01/2023 and received an estimated OOP costs for potential specimens to be sent from [facility] in billing at [employer]. I spoke w/ the pt on this day. However, she didn't have the time to go over these. So, pt inquired if could be called back at a later time/date.   Before contacting pt back, I was made aware of the BCCCP program that could be offered to pt's in the case of low-income or pt's without insurance.  On 04/02/2023: pt was called by CB,RN w/ the BCCCP program and she noted LVM on pt's machine w/ BCCCP scheduling contact info.   I also attempted to reach pt on this day to inform her of this new information about the program. No answer and mailbox was full.  On 04/05/2023: I attempted to reach pt again regarding this information. No answer and mailbox full.  On 04/07/2023: PB w/ BCCCP program, reported attempting to contact pt regarding program information. No answer and mailbox was full.  CB, RN w/ BCCCP program also mentioned on this date that she had attempted another time to reach pt w/o success.

## 2023-04-23 NOTE — Telephone Encounter (Signed)
Caller & Relationship to patient:  MRN #  [id number]   Call Back Number:   Date of Last Office Visit: 04/08/2023     Date of Next Office Visit: 05/18/2023    Medication(s) to be Refilled: Cipro, metoprolol   Preferred Pharmacy:   ** Please notify patient to allow 48-72 hours to process** **Let patient know to contact pharmacy at the end of the day to make sure medication is ready. ** **If patient has not been seen in a year or longer, book an appointment **Advise to use MyChart for refill requests OR to contact their pharmacy

## 2023-04-26 ENCOUNTER — Other Ambulatory Visit: Payer: Self-pay

## 2023-04-26 MED ORDER — METOPROLOL TARTRATE 25 MG PO TABS: ORAL_TABLET | ORAL | 1 refills | Status: DC

## 2023-04-26 NOTE — Telephone Encounter (Signed)
Pt metoprolol was sent in not sure about the cipro. If pt needs to be seen please send message to front office . Thanks [employer]

## 2023-04-27 ENCOUNTER — Other Ambulatory Visit: Payer: Self-pay

## 2023-04-27 ENCOUNTER — Telehealth: Payer: Self-pay | Admitting: Nurse Practitioner

## 2023-04-27 NOTE — Telephone Encounter (Signed)
Caller & Relationship to patient:  MRN #  [id number]   Call Back Number:   Date of Last Office Visit: 04/27/2023     Date of Next Office Visit: Visit date not found    Medication(s) to be Refilled: Buspirone  Please call the PT back on this   Preferred Pharmacy:   ** Please notify patient to allow 48-72 hours to process** **Let patient know to contact pharmacy at the end of the day to make sure medication is ready. ** **If patient has not been seen in a year or longer, book an appointment **Advise to use MyChart for refill requests OR to contact their pharmacy

## 2023-04-27 NOTE — Telephone Encounter (Signed)
Caller & Relationship to patient:   MRN #  [id number]   Call Back Number:   Date of Last Office Visit: 04/26/2023     Date of Next Office Visit: Visit date not found    Medication(s) to be Refilled: Buspar (patient states she's out of meds.  Preferred Pharmacy: [facility]  ** Please notify patient to allow 48-72 hours to process** **Let patient know to contact pharmacy at the end of the day to make sure medication is ready. ** **If patient has not been seen in a year or longer, book an appointment **Advise to use MyChart for refill requests OR to contact their pharmacy

## 2023-04-27 NOTE — Telephone Encounter (Signed)
Pt has not been seen since 09/2022. Please advise Kh

## 2023-04-28 MED ORDER — BUSPIRONE HCL 10 MG PO TABS: 10.0000 mg | ORAL_TABLET | Freq: Two times a day (BID) | ORAL | 11 refills | Status: DC

## 2023-05-11 ENCOUNTER — Encounter: Payer: Self-pay | Admitting: Obstetrics and Gynecology

## 2023-05-18 ENCOUNTER — Encounter: Payer: Self-pay | Admitting: Obstetrics and Gynecology

## 2023-05-27 ENCOUNTER — Other Ambulatory Visit: Payer: Self-pay

## 2023-05-27 DIAGNOSIS — I10 Essential (primary) hypertension: Secondary | ICD-10-CM

## 2023-05-27 MED ORDER — BUSPIRONE HCL 10 MG PO TABS: 10.0000 mg | ORAL_TABLET | Freq: Two times a day (BID) | ORAL | 0 refills | Status: DC

## 2023-05-27 MED ORDER — ATORVASTATIN CALCIUM 40 MG PO TABS: ORAL_TABLET | ORAL | 0 refills | Status: DC

## 2023-05-27 MED ORDER — METOPROLOL TARTRATE 25 MG PO TABS: ORAL_TABLET | ORAL | 0 refills | Status: DC

## 2023-05-27 MED ORDER — LISINOPRIL 5 MG PO TABS: ORAL_TABLET | ORAL | 0 refills | Status: DC

## 2023-05-27 NOTE — Telephone Encounter (Signed)
Please send in for  30 days pt has appt scheduled 06/17/23. Thank you. Kh

## 2023-05-28 ENCOUNTER — Other Ambulatory Visit: Payer: Self-pay

## 2023-05-28 DIAGNOSIS — I10 Essential (primary) hypertension: Secondary | ICD-10-CM

## 2023-05-28 MED ORDER — LISINOPRIL 5 MG PO TABS: ORAL_TABLET | ORAL | 0 refills | Status: DC

## 2023-05-28 MED ORDER — METOPROLOL TARTRATE 25 MG PO TABS: ORAL_TABLET | ORAL | 0 refills | Status: DC

## 2023-05-28 NOTE — Telephone Encounter (Signed)
Pharmacy/patient requesting 90 day supplies of Metoprolol Tartrate 25 mg and Lisinopril 5 mg.

## 2023-06-03 ENCOUNTER — Other Ambulatory Visit: Payer: Managed Care, Other (non HMO)

## 2023-06-03 ENCOUNTER — Ambulatory Visit: Payer: Managed Care, Other (non HMO) | Admitting: Internal Medicine

## 2023-06-08 NOTE — Telephone Encounter (Signed)
Routing to provider for final review and closing encounter.

## 2023-06-09 ENCOUNTER — Other Ambulatory Visit: Payer: Self-pay | Admitting: Nurse Practitioner

## 2023-06-09 ENCOUNTER — Telehealth: Payer: Self-pay | Admitting: Nurse Practitioner

## 2023-06-09 ENCOUNTER — Other Ambulatory Visit: Payer: Self-pay

## 2023-06-09 DIAGNOSIS — D508 Other iron deficiency anemias: Secondary | ICD-10-CM

## 2023-06-09 MED ORDER — FERROUS SULFATE 325 (65 FE) MG PO TABS: 325.0000 mg | ORAL_TABLET | Freq: Two times a day (BID) | ORAL | 0 refills | Status: DC

## 2023-06-09 MED ORDER — FERROUS SULFATE 325 (65 FE) MG PO TABS
325.0000 mg | ORAL_TABLET | Freq: Two times a day (BID) | ORAL | 0 refills | Status: DC
Start: 2023-06-09 — End: 2023-08-27

## 2023-06-09 NOTE — Telephone Encounter (Signed)
Prescription Request  06/09/2023  LOV: 10/02/2022  What is the name of the medication or equipment? ferrous sulfate (FEROSUL) 325 (65 FE) MG tablet   Have you contacted your pharmacy to request a refill? Yes   Which pharmacy would you like this sent to?  [id number] G[place], [place] - [address] AT [facility] [address] [facility] [address] [place] [address] Phone: [phone] Fax: [phone]    Patient notified that their request is being sent to the clinical staff for review and that they should receive a response within 2 business days.   Please advise at [facility] [phone]  Please call pt with any questions - she gives verbal consent to leave detailed message on voicemail if she is unable to answer due to her being at work.

## 2023-06-09 NOTE — Telephone Encounter (Signed)
Done KH 

## 2023-06-10 NOTE — Telephone Encounter (Signed)
Forwarding msg to appt desk with pt's new insurance information.

## 2023-06-11 NOTE — Telephone Encounter (Signed)
error 

## 2023-06-17 ENCOUNTER — Other Ambulatory Visit: Payer: Self-pay | Admitting: Nurse Practitioner

## 2023-06-17 ENCOUNTER — Telehealth: Payer: Self-pay

## 2023-06-17 ENCOUNTER — Encounter: Payer: Self-pay | Admitting: Nurse Practitioner

## 2023-06-17 ENCOUNTER — Ambulatory Visit (INDEPENDENT_AMBULATORY_CARE_PROVIDER_SITE_OTHER): Payer: Self-pay | Admitting: Nurse Practitioner

## 2023-06-17 DIAGNOSIS — I10 Essential (primary) hypertension: Secondary | ICD-10-CM

## 2023-06-17 MED ORDER — ATORVASTATIN CALCIUM 40 MG PO TABS
ORAL_TABLET | ORAL | 0 refills | Status: DC
Start: 2023-06-17 — End: 2023-06-17

## 2023-06-17 NOTE — Telephone Encounter (Signed)
Patient called & left a message on triage voicemail. She stated she is scheduled for a colposcopy & depo provera on November 4th. She said that even with her depo that she gets a cycle about q32mths. She got her cycle the 1st week of October & is having cramping now but no bleeding. She is concerned & wanted to know if she could still do the colposcopy if she is bleeding the day of & also wanted to talk to the dr about her concerns regarding still bleeding with the depo. Patient states it is okay to leave a detailed message on her voicemail.

## 2023-06-17 NOTE — Patient Instructions (Signed)
1. Hypertension, unspecified type  - atorvastatin (LIPITOR) 40 MG tablet; TAKE 1 TABLET(40 MG) BY MOUTH DAILY  Dispense: 30 tablet; Refill: 0   Follow up:  Follow up in 3 months

## 2023-06-17 NOTE — Progress Notes (Signed)
Subjective   Patient ID: Kellie Shaffer, female    DOB: 1990/09/06, 32 y.o.   MRN: [id number]  Chief Complaint  Patient presents with   Follow-up    Referring provider: [name], NP  Kellie Shaffer is a 32 y.o. female with Past Medical History: 2019: Abnormal Pap smear of cervix     Comment:  LSIL May 2013: Abortion No date: Anemia No date: Angio-edema No date: Anxiety No date: Diabetes mellitus without complication (HCC) 11/22/2019: Dizziness 03/2020: Hemoglobin A1c less than 7.0% No date: History of ITP No date: History of palpitations     Comment:  Cardiac event monitor June 2019: Normal.  Sinus rhythm               no arrhythmias or significant PACs/PVCs. No date: Hypercholesteremia 03/2020: Hyperglycemia No date: Hypertension No date: Hyperthyroidism 09/04/2016: Iron deficiency anemia No date: Peripheral vertigo No date: Recurrent upper respiratory infection (URI) 11/22/2019: Seasonal allergies No date: Urticaria 11/22/2019: Vertigo No date: Vertigo   HPI  Patient presents today for follow-up visit.  Overall she has been doing well with no new issues or concerns.  Patient is followed by hematology for anemia.  Patient is followed by endocrinology for thyroid disorder.  Patient does have an established OB/GYN. Denies f/c/s, n/v/d, hemoptysis, PND, leg swelling. Denies chest pain or edema.     Allergies  Allergen Reactions   Benadryl [Diphenhydramine Hcl] Anaphylaxis and Hives   Chloroxine Hives    Clorox    Immunization History  Administered Date(s) Administered   Influenza,inj,Quad PF,6+ Mos 10/04/2018, 06/21/2019   Pneumococcal-Unspecified 08/17/2009   Tdap 08/17/2010    Tobacco History: Social History   Tobacco Use  Smoking Status Former   Current packs/day: 0.00   Average packs/day: 0.3 packs/day for 10.0 years (2.5 ttl pk-yrs)   Types: Cigarettes   Start date: 2011   Quit date: 2021   Years since quitting: 3.8   Smokeless Tobacco Never   Counseling given: Not Answered   Outpatient Encounter Medications as of 06/17/2023  Medication Sig   Biotin 56213 MCG TABS Take 1,000 mcg by mouth daily.    busPIRone (BUSPAR) 10 MG tablet Take 1 tablet (10 mg total) by mouth 2 (two) times daily.   cetirizine (ZYRTEC) 10 MG tablet Take 1 tablet (10 mg total) by mouth daily as needed for allergies.   CINNAMON PO Take by mouth.   ferrous sulfate (FEROSUL) 325 (65 FE) MG tablet Take 1 tablet (325 mg total) by mouth 2 (two) times daily with a meal.   Ginger, Zingiber officinalis, (GINGER PO) Take by mouth.   lisinopril (ZESTRIL) 5 MG tablet TAKE 1 TABLET(5 MG) BY MOUTH DAILY   methimazole (TAPAZOLE) 5 MG tablet Take 1 tablet (5 mg total) by mouth every other day.   metoprolol tartrate (LOPRESSOR) 25 MG tablet TAKE 1/2 TABLET BY MOUTH EVERY MORNING   Multiple Vitamins-Minerals (MULTIVITAMIN ADULT PO) Take by mouth daily.   omeprazole (PRILOSEC) 40 MG capsule Take 1 capsule (40 mg total) by mouth daily.   OVER THE COUNTER MEDICATION Beet Root supplement   POTASSIUM PO Take by mouth.   Probiotic Product (PROBIOTIC-10 PO) Take by mouth. 1 caples   [DISCONTINUED] atorvastatin (LIPITOR) 40 MG tablet TAKE 1 TABLET(40 MG) BY MOUTH DAILY   atorvastatin (LIPITOR) 40 MG tablet TAKE 1 TABLET(40 MG) BY MOUTH DAILY   medroxyPROGESTERone (DEPO-PROVERA) 150 MG/ML injection Inject 1 mL (150 mg total) into the muscle once for 1 dose.  No facility-administered encounter medications on file as of 06/17/2023.    Review of Systems  Review of Systems  Constitutional: Negative.   HENT: Negative.    Cardiovascular: Negative.   Gastrointestinal: Negative.   Allergic/Immunologic: Negative.   Neurological: Negative.   Psychiatric/Behavioral: Negative.       Objective:   BP 128/73 (BP Location: Left Arm, Patient Position: Sitting, Cuff Size: Large)   Pulse 67   Ht 5\' 9"  (1.753 m)   Wt 207 lb (93.9 kg)   SpO2 95%   BMI 30.57  kg/m   Wt Readings from Last 5 Encounters:  06/17/23 207 lb (93.9 kg)  03/24/23 212 lb (96.2 kg)  12/03/22 202 lb 8 oz (91.9 kg)  11/06/22 207 lb (93.9 kg)  10/07/22 209 lb 12.8 oz (95.2 kg)     Physical Exam Vitals and nursing note reviewed.  Constitutional:      General: She is not in acute distress.    Appearance: She is well-developed.  Cardiovascular:     Rate and Rhythm: Normal rate and regular rhythm.  Pulmonary:     Effort: Pulmonary effort is normal.     Breath sounds: Normal breath sounds.  Neurological:     Mental Status: She is alert and oriented to person, place, and time.       Assessment & Plan:   Hypertension, unspecified type -     Atorvastatin Calcium; TAKE 1 TABLET(40 MG) BY MOUTH DAILY  Dispense: 30 tablet; Refill: 0     Return in about 6 months (around 12/15/2023).   [name], NP 06/17/2023

## 2023-06-18 NOTE — Telephone Encounter (Signed)
Per EB:  "Keep appointment or I can put her in on Monday. Dr. Karma Greaser"  Pt notified and voiced understanding.  Encounter closed.

## 2023-06-18 NOTE — Telephone Encounter (Signed)
Let us know if pt should keep appt date/time if bleeding could be a possibility.   Also, would you recommend pt schedule an alternate appt with primary provider to discuss [facility] and cycles?  Please advise.

## 2023-06-21 ENCOUNTER — Encounter: Payer: Commercial Managed Care - PPO | Admitting: Obstetrics and Gynecology

## 2023-06-21 ENCOUNTER — Ambulatory Visit (INDEPENDENT_AMBULATORY_CARE_PROVIDER_SITE_OTHER): Payer: Commercial Managed Care - PPO

## 2023-06-21 DIAGNOSIS — Z3042 Encounter for surveillance of injectable contraceptive: Secondary | ICD-10-CM | POA: Diagnosis not present

## 2023-06-21 MED ORDER — MEDROXYPROGESTERONE ACETATE 150 MG/ML IM SUSY
150.0000 mg | PREFILLED_SYRINGE | Freq: Once | INTRAMUSCULAR | Status: AC
  Administered 2023-06-21: 150 mg via INTRAMUSCULAR

## 2023-06-24 ENCOUNTER — Telehealth: Payer: Self-pay | Admitting: Medical Oncology

## 2023-06-24 ENCOUNTER — Other Ambulatory Visit: Payer: Self-pay | Admitting: Medical Oncology

## 2023-06-24 DIAGNOSIS — D696 Thrombocytopenia, unspecified: Secondary | ICD-10-CM

## 2023-06-24 NOTE — Telephone Encounter (Signed)
Appt confirmed

## 2023-06-25 ENCOUNTER — Telehealth: Payer: Self-pay | Admitting: Nurse Practitioner

## 2023-06-25 ENCOUNTER — Other Ambulatory Visit: Payer: Self-pay | Admitting: Nurse Practitioner

## 2023-06-25 DIAGNOSIS — Z13220 Encounter for screening for lipoid disorders: Secondary | ICD-10-CM

## 2023-06-25 DIAGNOSIS — I10 Essential (primary) hypertension: Secondary | ICD-10-CM

## 2023-06-25 NOTE — Telephone Encounter (Signed)
Pt LVM stating Tonya told her she needed to get her cholesterol checked when she goes to see Dr. Gwenyth Bouillon on 11/11. His office told her that Ar[facility] Patten would need to send that order over to them for her to get that done.   Please call pt to advise if this can/will be done or not

## 2023-06-28 ENCOUNTER — Inpatient Hospital Stay: Payer: Commercial Managed Care - PPO

## 2023-06-28 ENCOUNTER — Inpatient Hospital Stay: Payer: Commercial Managed Care - PPO | Attending: Internal Medicine | Admitting: Internal Medicine

## 2023-06-28 VITALS — BP 138/78 | HR 63 | Temp 98.7°F | Resp 16 | Ht 69.0 in | Wt 209.9 lb

## 2023-06-28 DIAGNOSIS — E0590 Thyrotoxicosis, unspecified without thyrotoxic crisis or storm: Secondary | ICD-10-CM | POA: Insufficient documentation

## 2023-06-28 DIAGNOSIS — D696 Thrombocytopenia, unspecified: Secondary | ICD-10-CM | POA: Diagnosis not present

## 2023-06-28 DIAGNOSIS — D693 Immune thrombocytopenic purpura: Secondary | ICD-10-CM | POA: Diagnosis present

## 2023-06-28 LAB — CBC WITH DIFFERENTIAL (CANCER CENTER ONLY)
Abs Immature Granulocytes: 0.01 10*3/uL (ref 0.00–0.07)
Basophils Absolute: 0 10*3/uL (ref 0.0–0.1)
Basophils Relative: 0 %
Eosinophils Absolute: 0.1 10*3/uL (ref 0.0–0.5)
Eosinophils Relative: 2 %
HCT: 36.9 % (ref 36.0–46.0)
Hemoglobin: 12.1 g/dL (ref 12.0–15.0)
Immature Granulocytes: 0 %
Lymphocytes Relative: 36 %
Lymphs Abs: 2.1 10*3/uL (ref 0.7–4.0)
MCH: 28.9 pg (ref 26.0–34.0)
MCHC: 32.8 g/dL (ref 30.0–36.0)
MCV: 88.3 fL (ref 80.0–100.0)
Monocytes Absolute: 0.6 10*3/uL (ref 0.1–1.0)
Monocytes Relative: 11 %
Neutro Abs: 3 10*3/uL (ref 1.7–7.7)
Neutrophils Relative %: 51 %
Platelet Count: 148 10*3/uL — ABNORMAL LOW (ref 150–400)
RBC: 4.18 MIL/uL (ref 3.87–5.11)
RDW: 13.2 % (ref 11.5–15.5)
WBC Count: 5.8 10*3/uL (ref 4.0–10.5)
nRBC: 0 % (ref 0.0–0.2)

## 2023-06-28 LAB — CMP (CANCER CENTER ONLY)
ALT: 13 U/L (ref 0–44)
AST: 9 U/L — ABNORMAL LOW (ref 15–41)
Albumin: 4.2 g/dL (ref 3.5–5.0)
Alkaline Phosphatase: 67 U/L (ref 38–126)
Anion gap: 5 (ref 5–15)
BUN: 15 mg/dL (ref 6–20)
CO2: 24 mmol/L (ref 22–32)
Calcium: 9.4 mg/dL (ref 8.9–10.3)
Chloride: 111 mmol/L (ref 98–111)
Creatinine: 0.73 mg/dL (ref 0.44–1.00)
GFR, Estimated: >60 mL/min (ref >60)
Glucose, Bld: 127 mg/dL — ABNORMAL HIGH (ref 70–99)
Potassium: 3.6 mmol/L (ref 3.5–5.1)
Sodium: 140 mmol/L (ref 135–145)
Total Bilirubin: 0.7 mg/dL (ref <1.2)
Total Protein: 7.6 g/dL (ref 6.5–8.1)

## 2023-06-28 LAB — LACTATE DEHYDROGENASE: LDH: 157 U/L (ref 98–192)

## 2023-06-28 NOTE — Progress Notes (Signed)
[facility] [phone]   [phone]  OFFICE PROGRESS NOTE  [name], NP 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  CURRENT THERAPY: Observation.  INTERVAL HISTORY: Kellie Shaffer 32 y.o. female returns to the clinic today for 20-month follow-up visit.Discussed the use of AI scribe software for clinical note transcription with the patient, who gave verbal consent to proceed.  History of Present Illness   Kellie Shaffer, a 32 year old with a history of idiopathic thrombocytopenic purpura (ITP) and hyperthyroidism, presents for a routine follow-up. The ITP was diagnosed in October 2015, at which time the platelet count was significantly low, necessitating treatment with a high-dose steroid taper. Since then, the patient has been under observation with no significant issues reported. The hyperthyroidism is managed by an endocrinologist.  In the past six months, the patient reports no new health complaints, specifically denying any bleeding issues, bruising, nose bleeds, or gum bleeds. The patient is aware of the signs of low platelets, such as sticky eyes and bleeding issues.  There have been no changes in the patient's medication regimen. The patient's physical examination was unremarkable.       MEDICAL HISTORY: Past Medical History:  Diagnosis Date   Abnormal Pap smear of cervix 2019   LSIL   Abortion May 2013   Anemia    Angio-edema    Anxiety    Diabetes mellitus without complication (HCC)    Dizziness 11/22/2019   Hemoglobin A1c less than 7.0% 03/2020   History of ITP    History of palpitations    Cardiac event monitor June 2019: Normal.  Sinus rhythm no arrhythmias or significant PACs/PVCs.   Hypercholesteremia    Hyperglycemia 03/2020   Hypertension     Hyperthyroidism    Iron deficiency anemia 09/04/2016   Peripheral vertigo    Recurrent upper respiratory infection (URI)    Seasonal allergies 11/22/2019   Urticaria    Vertigo 11/22/2019   Vertigo     ALLERGIES:  is allergic to benadryl [diphenhydramine hcl] and chloroxine.  MEDICATIONS:  Current Outpatient Medications  Medication Sig Dispense Refill   atorvastatin (LIPITOR) 40 MG tablet TAKE 1 TABLET(40 MG) BY MOUTH DAILY 90 tablet 0   Biotin 81191 MCG TABS Take 1,000 mcg by mouth daily.      busPIRone (BUSPAR) 10 MG tablet Take 1 tablet (10 mg total) by mouth 2 (two) times daily. 60 tablet 0   cetirizine (ZYRTEC) 10 MG tablet Take 1 tablet (10 mg total) by mouth daily as needed for allergies. 90 tablet 1   CINNAMON PO Take by mouth.     ferrous sulfate (FEROSUL) 325 (65 FE) MG tablet Take 1 tablet (325 mg total) by mouth 2 (two) times daily with a meal. 60 tablet 0   Ginger, Zingiber officinalis, (GINGER PO) Take by mouth.     lisinopril (ZESTRIL) 5 MG tablet TAKE 1 TABLET(5 MG) BY MOUTH DAILY 90 tablet 0   medroxyPROGESTERone (DEPO-PROVERA) 150 MG/ML injection Inject 1 mL (150 mg total) into the muscle once for 1 dose. 1 mL 0   methimazole (TAPAZOLE) 5 MG tablet Take 1 tablet (5 mg total) by mouth every other day. 45 tablet 3   metoprolol tartrate (LOPRESSOR) 25 MG tablet TAKE 1/2 TABLET BY MOUTH EVERY MORNING 45 tablet 0   Multiple Vitamins-Minerals (  MULTIVITAMIN ADULT PO) Take by mouth daily.     omeprazole (PRILOSEC) 40 MG capsule Take 1 capsule (40 mg total) by mouth daily. 30 capsule 1   OVER THE COUNTER MEDICATION Beet Root supplement     POTASSIUM PO Take by mouth.     Probiotic Product (PROBIOTIC-10 PO) Take by mouth. 1 caples     No current facility-administered medications for this visit.    SURGICAL HISTORY:  Past Surgical History:  Procedure Laterality Date   LAPAROSCOPIC APPENDECTOMY N/A 01/14/2017   Procedure: APPENDECTOMY LAPAROSCOPIC;  Surgeon: Claud Kelp, MD;  Location: [facility];  Service: General;  Laterality: N/A;    REVIEW OF SYSTEMS:  A comprehensive review of systems was negative.   PHYSICAL EXAMINATION: General appearance: alert, cooperative, appears stated age, and no distress Head: Normocephalic, without obvious abnormality, atraumatic Neck: no adenopathy, no JVD, supple, symmetrical, trachea midline, and thyroid not enlarged, symmetric, no tenderness/mass/nodules Lymph nodes: Cervical, supraclavicular, and axillary nodes normal. Resp: clear to auscultation bilaterally Back: symmetric, no curvature. ROM normal. No CVA tenderness. Cardio: regular rate and rhythm, S1, S2 normal, no murmur, click, rub or gallop GI: soft, non-tender; bowel sounds normal; no masses,  no organomegaly Extremities: extremities normal, atraumatic, no cyanosis or edema  ECOG PERFORMANCE STATUS: 0 - Asymptomatic  Blood pressure 138/78, pulse 63, temperature 98.7 F (37.1 C), temperature source Temporal, resp. rate 16, height 5\' 9"  (1.753 m), weight 209 lb 14.4 oz (95.2 kg), SpO2 100%.  LABORATORY DATA: Lab Results  Component Value Date   WBC 6.3 12/03/2022   HGB 12.6 12/03/2022   HCT 37.9 12/03/2022   MCV 87.1 12/03/2022   PLT 193 12/03/2022      Chemistry      Component Value Date/Time   NA 139 12/03/2022 0757   NA 138 08/26/2022 0841   NA 132 (L) 08/13/2017 1044   K 3.6 12/03/2022 0757   K 4.3 08/13/2017 1044   CL 106 12/03/2022 0757   CO2 25 12/03/2022 0757   CO2 25 08/13/2017 1044   BUN 12 12/03/2022 0757   BUN 16 08/26/2022 0841   BUN 18.6 08/13/2017 1044   CREATININE 0.76 12/03/2022 0757   CREATININE 1.3 (H) 08/13/2017 1044   GLU 517 (H) 08/13/2017 1257      Component Value Date/Time   CALCIUM 9.7 12/03/2022 0757   CALCIUM 9.9 08/13/2017 1044   ALKPHOS 59 12/03/2022 0757   ALKPHOS 100 08/13/2017 1044   AST 14 (L) 12/03/2022 0757   AST 5 08/13/2017 1044   ALT 26 12/03/2022 0757   ALT 15 08/13/2017 1044   BILITOT 1.2  12/03/2022 0757   BILITOT 0.52 08/13/2017 1044       RADIOGRAPHIC STUDIES: No results found.  ASSESSMENT AND PLAN: This is a very pleasant 32 years old African-American female with: 1) idiopathic thrombocytopenic purpura: She was treated a few months ago with a taper dose of prednisone because of the significant thrombocytopenia.  2) For the hyperthyroidism, she is currently on methimazole and followed by endocrinology.    Immune Thrombocytopenic Purpura (ITP) Diagnosed in October 2015. Initial treatment with high-dose steroids. Currently under observation with platelet count at 148,000 (slightly below normal). No recent bleeding issues, bruising, epistaxis, or gum bleeding. Advised to monitor for signs of thrombocytopenia, especially during winter due to increased risk from viral infections. Potential need for steroid treatment if symptoms arise. - Continue observation - Monitor for signs of thrombocytopenia - Follow-up in six months  Hyperthyroidism Managed by endocrinologist  Dr. Elvera Lennox. No changes in medication. - Continue current management with endocrinologist  General Health Maintenance Discussed the impact of viral infections on platelet counts during winter. - Provide copy of blood work - Advise to call if any concerning symptoms arise.    The patient voices understanding of current disease status and treatment options and is in agreement with the current care plan. All questions were answered. The patient knows to call the clinic with any problems, questions or concerns. We can certainly see the patient much sooner if necessary.  Disclaimer: This note was dictated with voice recognition software. Similar sounding words can inadvertently be transcribed and may not be corrected upon review.

## 2023-06-29 NOTE — Telephone Encounter (Signed)
Lab appt was made. KH

## 2023-07-09 ENCOUNTER — Other Ambulatory Visit: Payer: Self-pay

## 2023-07-22 ENCOUNTER — Other Ambulatory Visit (HOSPITAL_COMMUNITY)
Admission: RE | Admit: 2023-07-22 | Discharge: 2023-07-22 | Disposition: A | Discharge status: Home / Self Care | Payer: Commercial Managed Care - PPO | Source: Ambulatory Visit | Attending: Obstetrics and Gynecology | Admitting: Obstetrics and Gynecology

## 2023-07-22 ENCOUNTER — Encounter: Payer: Self-pay | Admitting: Obstetrics and Gynecology

## 2023-07-22 ENCOUNTER — Ambulatory Visit (INDEPENDENT_AMBULATORY_CARE_PROVIDER_SITE_OTHER): Payer: Commercial Managed Care - PPO | Admitting: Obstetrics and Gynecology

## 2023-07-22 VITALS — BP 118/80 | HR 70 | Resp 16

## 2023-07-22 DIAGNOSIS — R87612 Low grade squamous intraepithelial lesion on cytologic smear of cervix (LGSIL): Secondary | ICD-10-CM

## 2023-07-22 DIAGNOSIS — Z01812 Encounter for preprocedural laboratory examination: Secondary | ICD-10-CM

## 2023-07-22 LAB — PREGNANCY, URINE: Preg Test, Ur: NEGATIVE

## 2023-07-22 NOTE — Addendum Note (Signed)
Addended by: Eliezer Bottom on: 07/22/2023 04:14 PM   Modules accepted: Orders

## 2023-07-22 NOTE — Progress Notes (Signed)
Colposcopy Procedure Note Kellie Shaffer 07/22/2023  Indications: LGSIL, h/o LEEP  Procedure Details  The risks and benefits of the procedure and Written informed consent obtained.  Speculum placed in vagina and excellent visualization of cervix achieved, cervix swabbed x 3 with acetic acid solution.  Impression:CIN1  Satisfactory( ECC zone seen): yes   Findings:  Cervix colposcopy biopsy taken: 5 O'clock for    ECC performed with cytobrush  Hemostasis obtained with application of Monsel's Solution    Complications:    Patient tolerated the procedure well  Plan:  To notify patient in epic and phone call of results and plan of care Discussed she must have two consecutive normal pap smears Q7months before returning to annual pap smears Reports completion of gardesil vaccination  Earley Favor

## 2023-07-27 LAB — SURGICAL PATHOLOGY

## 2023-08-18 ENCOUNTER — Emergency Department (HOSPITAL_BASED_OUTPATIENT_CLINIC_OR_DEPARTMENT_OTHER): Payer: Commercial Managed Care - PPO

## 2023-08-18 ENCOUNTER — Encounter (HOSPITAL_BASED_OUTPATIENT_CLINIC_OR_DEPARTMENT_OTHER): Payer: Self-pay | Admitting: Emergency Medicine

## 2023-08-18 ENCOUNTER — Emergency Department (HOSPITAL_BASED_OUTPATIENT_CLINIC_OR_DEPARTMENT_OTHER)
Admission: EM | Admit: 2023-08-18 | Discharge: 2023-08-19 | Disposition: A | Discharge status: Home / Self Care | Payer: Commercial Managed Care - PPO | Attending: Emergency Medicine | Admitting: Emergency Medicine

## 2023-08-18 ENCOUNTER — Other Ambulatory Visit: Payer: Self-pay

## 2023-08-18 DIAGNOSIS — Z20822 Contact with and (suspected) exposure to covid-19: Secondary | ICD-10-CM | POA: Diagnosis not present

## 2023-08-18 DIAGNOSIS — E119 Type 2 diabetes mellitus without complications: Secondary | ICD-10-CM | POA: Diagnosis not present

## 2023-08-18 DIAGNOSIS — S46912A Strain of unspecified muscle, fascia and tendon at shoulder and upper arm level, left arm, initial encounter: Secondary | ICD-10-CM | POA: Insufficient documentation

## 2023-08-18 DIAGNOSIS — Z79899 Other long term (current) drug therapy: Secondary | ICD-10-CM | POA: Diagnosis not present

## 2023-08-18 DIAGNOSIS — X58XXXA Exposure to other specified factors, initial encounter: Secondary | ICD-10-CM | POA: Diagnosis not present

## 2023-08-18 DIAGNOSIS — R0982 Postnasal drip: Secondary | ICD-10-CM | POA: Diagnosis not present

## 2023-08-18 DIAGNOSIS — I10 Essential (primary) hypertension: Secondary | ICD-10-CM | POA: Diagnosis not present

## 2023-08-18 DIAGNOSIS — S4992XA Unspecified injury of left shoulder and upper arm, initial encounter: Secondary | ICD-10-CM | POA: Diagnosis present

## 2023-08-18 DIAGNOSIS — B009 Herpesviral infection, unspecified: Secondary | ICD-10-CM

## 2023-08-18 HISTORY — DX: Herpesviral infection, unspecified: B00.9

## 2023-08-18 LAB — RESP PANEL BY RT-PCR (RSV, FLU A&B, COVID)  RVPGX2
Influenza A by PCR: NEGATIVE
Influenza B by PCR: NEGATIVE
Resp Syncytial Virus by PCR: NEGATIVE
SARS Coronavirus 2 by RT PCR: NEGATIVE

## 2023-08-18 NOTE — ED Triage Notes (Signed)
 Pt states hurts to inhale and left arm pain, took otc meds at home with no relief. Denies Hx or injury started today.

## 2023-08-19 MED ORDER — METHOCARBAMOL 500 MG PO TABS: 500.0000 mg | ORAL_TABLET | Freq: Two times a day (BID) | ORAL | 0 refills | Status: DC

## 2023-08-19 MED ORDER — KETOROLAC TROMETHAMINE 60 MG/2ML IM SOLN
30.0000 mg | Freq: Once | INTRAMUSCULAR | Status: AC
  Administered 2023-08-19: 30 mg via INTRAMUSCULAR
  Filled 2023-08-19: qty 2

## 2023-08-19 NOTE — Discharge Instructions (Addendum)

## 2023-08-19 NOTE — ED Provider Notes (Signed)
 [facility] Provider Note  CSN: [id number] Arrival date & time: 08/18/23 2304  Chief Complaint(s) Arm Pain  HPI Kellie Shaffer is a 33 y.o. female     Arm Pain This is a new problem. The current episode started 6 to 12 hours ago. The problem occurs constantly. The problem has not changed since onset.Pertinent negatives include no chest pain, no abdominal pain, no headaches and no shortness of breath. Exacerbated by: movement. Relieved by: being still. Treatments tried: motrin  and tylenol .   Reports that she woke up with scratchy throat this morning. No fever, cough, or SOB  Past Medical History Past Medical History:  Diagnosis Date   Abnormal Pap smear of cervix 2019   LSIL, 03-24-23 lgsil HPV HR +   Abortion 12/2011   Anemia    Angio-edema    Anxiety    Diabetes mellitus without complication (HCC)    Dizziness 11/22/2019   Hemoglobin A1c less than 7.0% 03/2020   History of ITP    History of palpitations    Cardiac event monitor June 2019: Normal.  Sinus rhythm no arrhythmias or significant PACs/PVCs.   Hypercholesteremia    Hyperglycemia 03/2020   Hypertension    Hyperthyroidism    Iron deficiency anemia 09/04/2016   Peripheral vertigo    Recurrent upper respiratory infection (URI)    Seasonal allergies 11/22/2019   Urticaria    Vertigo 11/22/2019   Vertigo    Patient Active Problem List   Diagnosis Date Noted   Closed nondisplaced fracture of proximal phalanx of lesser toe of left foot 10/02/2022   History of ITP 06/11/2021   Seasonal allergies 11/23/2019   Hemoglobin A1C between 7% and 9% indicating borderline diabetic control (HCC) 09/28/2019   Diarrhea 05/29/2019   Dizziness 05/29/2019   Type 2 diabetes mellitus without complication, without long-term current use of insulin  (HCC) 11/09/2018   Hypertension 11/09/2018   Anxiety 11/09/2018   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 Authorizing Provider  methocarbamol  (ROBAXIN ) 500 MG tablet Take 1-2 tablets (500-1,000 mg total) by mouth 2 (two) times daily. 08/19/23  Yes Caira Poche, Raynell Moder, MD  atorvastatin  (LIPITOR) 40 MG tablet TAKE 1 TABLET(40 MG) BY MOUTH DAILY 06/25/23   Oley Bascom RAMAN, NP  Biotin  10000 MCG TABS Take 1,000 mcg by mouth daily.     [provider]  busPIRone  (BUSPAR ) 10 MG tablet Take 1 tablet (10 mg total) by mouth 2 (two) times daily. 05/27/23   Oley Bascom RAMAN, NP  cetirizine  (ZYRTEC ) 10 MG tablet Take 1 tablet (10 mg total) by mouth daily as needed for allergies. 08/21/20   Jeneal Danita Macintosh, MD  CINNAMON PO Take by mouth.    [provider]  ferrous sulfate  (FEROSUL) 325 (65 FE) MG tablet Take 1 tablet (325 mg total) by mouth 2 (two) times daily with a meal. 06/09/23   Oley Bascom RAMAN, NP  Ginger, Zingiber officinalis, (GINGER PO) Take by mouth.    [provider]  lisinopril  (ZESTRIL ) 5 MG  tablet TAKE 1 TABLET(5 MG) BY MOUTH DAILY 06/25/23   Nichols, Tonya S, NP  medroxyPROGESTERone  (DEPO-PROVERA ) 150 MG/ML injection Inject 1 mL (150 mg total) into the muscle once for 1 dose. 03/16/23 07/22/23  Chrzanowski, Jami B, NP  methimazole  (TAPAZOLE ) 5 MG tablet Take 1 tablet (5 mg total) by mouth every other day. 03/18/23   Trixie File, MD  metoprolol  tartrate (LOPRESSOR ) 25 MG tablet TAKE 1/2 TABLET BY MOUTH EVERY MORNING 05/28/23   Nichols, Tonya S, NP  Multiple Vitamins-Minerals  (MULTIVITAMIN ADULT PO) Take by mouth daily.    [provider]  omeprazole  (PRILOSEC) 40 MG capsule Take 1 capsule (40 mg total) by mouth daily. 01/21/22   Sherrod Sherrod, MD  OVER THE COUNTER MEDICATION Beet Root supplement    [provider]  POTASSIUM PO Take by mouth.    [provider]  Probiotic Product (PROBIOTIC-10 PO) Take by mouth. 1 caples    [provider]                                                                                                                                    Allergies Benadryl [diphenhydramine hcl] and Chloroxine  Review of Systems Review of Systems  Respiratory:  Negative for shortness of breath.   Cardiovascular:  Negative for chest pain.  Gastrointestinal:  Negative for abdominal pain.  Neurological:  Negative for headaches.   As noted in HPI  Physical Exam Vital Signs  I have reviewed the triage vital signs BP 136/85   Pulse 77   Temp 100 F (37.8 C)   Resp 20   Ht 5' 9 (1.753 m)   Wt 93.9 kg   SpO2 100%   BMI 30.57 kg/m   Physical Exam Vitals reviewed.  Constitutional:      General: She is not in acute distress.    Appearance: She is well-developed. She is not diaphoretic.  HENT:     Head: Normocephalic and atraumatic.     Right Ear: External ear normal.     Left Ear: External ear normal.     Nose: Nose normal.     Mouth/Throat:     Pharynx: Postnasal drip present.     Tonsils: No tonsillar exudate.  Eyes:     General: No scleral icterus.    Conjunctiva/sclera: Conjunctivae normal.  Neck:     Trachea: Phonation normal.  Cardiovascular:     Rate and Rhythm: Normal rate and regular rhythm.  Pulmonary:     Effort: Pulmonary effort is normal. No respiratory distress.     Breath sounds: No stridor.  Abdominal:     General: There is no distension.  Musculoskeletal:        General: Normal range of motion.     Cervical back: Normal range of motion.     Thoracic back: Spasms and  tenderness present.       Back:  Neurological:  Mental Status: She is alert and oriented to person, place, and time.  Psychiatric:        Behavior: Behavior normal.     ED Results and Treatments Labs (all labs ordered are listed, but only abnormal results are displayed) Labs Reviewed  RESP PANEL BY RT-PCR (RSV, FLU A&B, COVID)  [id number]                                                                                                                         EKG  EKG Interpretation Date/Time:  Wednesday August 18 2023 23:14:45 EST Ventricular Rate:  78 PR Interval:  142 QRS Duration:  88 QT Interval:  384 QTC Calculation: 437 R Axis:   7  Text Interpretation: Normal sinus rhythm Possible Anterior infarct , age undetermined Abnormal ECG When compared with ECG of 06-May-2021 12:34, PREVIOUS ECG IS PRESENT Confirmed by Trine Likes [phone]) on 08/19/2023 12:24:22 AM       Radiology DG Shoulder Left Result Date: 08/18/2023 CLINICAL DATA:  Left shoulder pain, no known injury, initial encounter EXAM: LEFT SHOULDER - 2+ VIEW COMPARISON:  None Available. FINDINGS: There is no evidence of fracture or dislocation. There is no evidence of arthropathy or other focal bone abnormality. Soft tissues are unremarkable. IMPRESSION: No acute abnormality noted. Electronically Signed   By: Oneil Devonshire M.D.   On: 08/18/2023 23:36    Medications Ordered in ED Medications  ketorolac  (TORADOL ) injection 30 mg (has no administration in time range)   Procedures Procedures  (including critical care time) Medical Decision Making / ED Course   Medical Decision Making Amount and/or Complexity of Data Reviewed Radiology: ordered.  Risk Prescription drug management.    DDx considered.  Sore throat Exam is consistent with postnasal drip.  No evidence of pharyngitis. Likely viral. Viral panel negative for COVID, influenza, RSV.  Left shoulder pain is most consistent with muscle strain/spasm of  the shoulder girdle muscle.  Doubt PE, pneumonia, pneumothorax.  Doubt cardiac etiology.  X-ray negative for any bony lesions, fractures, dislocations.    Final Clinical Impression(s) / ED Diagnoses Final diagnoses:  Muscle strain of left shoulder region, initial encounter  PND (post-nasal drip)   The patient appears reasonably screened and/or stabilized for discharge and I doubt any other medical condition or other [facility] requiring further screening, evaluation, or treatment in the ED at this time. I have discussed the findings, Dx and Tx plan with the patient/family who expressed understanding and agree(s) with the plan. Discharge instructions discussed at length. The patient/family was given strict return precautions who verbalized understanding of the instructions. No further questions at time of discharge.  Disposition: Discharge  Condition: Good  ED Discharge Orders          Ordered    methocarbamol  (ROBAXIN ) 500 MG tablet  2 times daily        08/19/23 0028              Follow Up: Oley Bascom RAMAN,  NP [address] [place] [address] [phone]  Call  to schedule an appointment for close follow up    This chart was dictated using voice recognition software.  Despite best efforts to proofread,  errors can occur which can change the documentation meaning.    Trine Raynell Moder, MD 08/19/23 [phone]

## 2023-08-27 ENCOUNTER — Other Ambulatory Visit: Payer: Self-pay | Admitting: Nurse Practitioner

## 2023-08-27 DIAGNOSIS — D508 Other iron deficiency anemias: Secondary | ICD-10-CM

## 2023-09-07 ENCOUNTER — Ambulatory Visit (INDEPENDENT_AMBULATORY_CARE_PROVIDER_SITE_OTHER): Payer: Commercial Managed Care - PPO

## 2023-09-07 DIAGNOSIS — Z3042 Encounter for surveillance of injectable contraceptive: Secondary | ICD-10-CM

## 2023-09-07 MED ORDER — MEDROXYPROGESTERONE ACETATE 150 MG/ML IM SUSP
150.0000 mg | Freq: Once | INTRAMUSCULAR | Status: AC
  Administered 2023-09-07: 150 mg via INTRAMUSCULAR

## 2023-10-01 ENCOUNTER — Other Ambulatory Visit: Payer: Self-pay | Admitting: Nurse Practitioner

## 2023-10-01 DIAGNOSIS — I10 Essential (primary) hypertension: Secondary | ICD-10-CM

## 2023-10-01 DIAGNOSIS — D508 Other iron deficiency anemias: Secondary | ICD-10-CM

## 2023-10-01 MED ORDER — ATORVASTATIN CALCIUM 40 MG PO TABS: ORAL_TABLET | ORAL | 0 refills | Status: DC

## 2023-10-01 MED ORDER — LISINOPRIL 5 MG PO TABS: ORAL_TABLET | ORAL | 0 refills | Status: DC

## 2023-10-01 NOTE — Telephone Encounter (Signed)
Copied from CRM [id number]. Topic: Clinical - Medication Refill >> Oct 01, 2023 11:12 AM Clayton Bibles wrote: Most Recent Primary Care Visit:  Provider: [name]  Department: [facility]  Visit Type: OFFICE VISIT  Date: 06/17/2023  Medication: atorvastatin (LIPITOR) 40 MG tablet AND lisinopril (ZESTRIL) 5 MG tablet   Has the patient contacted their pharmacy? Yes (Agent: If no, request that the patient contact the pharmacy for the refill. If patient does not wish to contact the pharmacy document the reason why and proceed with request.) (Agent: If yes, when and what did the pharmacy advise?) Pharmacy needs doctor's orders for refills   Is this the correct pharmacy for this prescription? Yes If no, delete pharmacy and type the correct one.  This is the patient's preferred pharmacy:  [id number] [place], [place] - [address] AT [facility] [address] [place] [address] Phone: [phone] Fax: [phone]   Has the prescription been filled recently? No  Is the patient out of the medication? No She has 2 pill left of both  Has the patient been seen for an appointment in the last year OR does the patient have an upcoming appointment? Yes  Can we respond through MyChart? Yes  Agent: Please be advised that Rx refills may take up to 3 business days. We ask that you follow-up with your pharmacy.

## 2023-10-01 NOTE — Telephone Encounter (Signed)
Please advise if you want pt to continue this dose. KH

## 2023-10-17 ENCOUNTER — Other Ambulatory Visit: Payer: Self-pay

## 2023-10-17 ENCOUNTER — Emergency Department (HOSPITAL_BASED_OUTPATIENT_CLINIC_OR_DEPARTMENT_OTHER)
Admission: EM | Admit: 2023-10-17 | Discharge: 2023-10-17 | Disposition: A | Discharge status: Home / Self Care | Attending: Emergency Medicine | Admitting: Emergency Medicine

## 2023-10-17 ENCOUNTER — Encounter (HOSPITAL_BASED_OUTPATIENT_CLINIC_OR_DEPARTMENT_OTHER): Payer: Self-pay

## 2023-10-17 DIAGNOSIS — N764 Abscess of vulva: Secondary | ICD-10-CM | POA: Insufficient documentation

## 2023-10-17 DIAGNOSIS — N762 Acute vulvitis: Secondary | ICD-10-CM

## 2023-10-17 DIAGNOSIS — Z79899 Other long term (current) drug therapy: Secondary | ICD-10-CM | POA: Diagnosis not present

## 2023-10-17 DIAGNOSIS — R102 Pelvic and perineal pain: Secondary | ICD-10-CM | POA: Diagnosis present

## 2023-10-17 MED ORDER — DOXYCYCLINE HYCLATE 100 MG PO TABS
100.0000 mg | ORAL_TABLET | Freq: Once | ORAL | Status: AC
  Administered 2023-10-17: 100 mg via ORAL
  Filled 2023-10-17: qty 1

## 2023-10-17 MED ORDER — DOXYCYCLINE HYCLATE 100 MG PO CAPS: 100.0000 mg | ORAL_CAPSULE | Freq: Two times a day (BID) | ORAL | 0 refills | Status: DC

## 2023-10-17 NOTE — ED Provider Notes (Signed)
 Kellie Shaffer   CSN: [id number] Arrival date & time: 10/17/23  1850     History  No chief complaint on file.   Kellie Shaffer is a 33 y.o. female.  Patient presents to the emergency department for evaluation of left-sided vaginal pain.  Patient noted a small pimple-like area that started after a nick while shaving.  About 2 weeks ago she had increasing pain and swelling in this area.  It broke open and drained spontaneously.  Since that time she has had soreness in the area.  It continues to drain a bit.  No fevers.  Normal urination.  She has had similar symptoms in the past.  She has been doing warm soaks with Epsom salt.       Home Medications Prior to Admission medications   Medication Sig Start Date End Date Taking? Authorizing Provider  doxycycline (VIBRAMYCIN) 100 MG capsule Take 1 capsule (100 mg total) by mouth 2 (two) times daily. 10/17/23  Yes Renne Crigler, PA-C  atorvastatin (LIPITOR) 40 MG tablet TAKE 1 TABLET(40 MG) BY MOUTH DAILY 10/01/23   [name], NP  Biotin 47829 MCG TABS Take 1,000 mcg by mouth daily.     [provider]  busPIRone (BUSPAR) 10 MG tablet Take 1 tablet (10 mg total) by mouth 2 (two) times daily. 05/27/23   [name], NP  cetirizine (ZYRTEC) 10 MG tablet Take 1 tablet (10 mg total) by mouth daily as needed for allergies. 08/21/20   Marcelyn Bruins, MD  CINNAMON PO Take by mouth.    [provider]  FEROSUL 325 (65 Fe) MG tablet TAKE 1 TABLET BY MOUTH TWICE DAILY WITH MEALS. 10/01/23   [name], NP  Ginger, Zingiber officinalis, (GINGER PO) Take by mouth.    [provider]  lisinopril (ZESTRIL) 5 MG tablet TAKE 1 TABLET(5 MG) BY MOUTH DAILY 10/01/23   [name], NP  medroxyPROGESTERone (DEPO-PROVERA) 150 MG/ML injection Inject 1 mL (150 mg total) into the muscle once for 1 dose. 03/16/23 07/22/23  Chrzanowski, Clearnce Hasten B, NP   methimazole (TAPAZOLE) 5 MG tablet Take 1 tablet (5 mg total) by mouth every other day. 03/18/23   Carlus Pavlov, MD  methocarbamol (ROBAXIN) 500 MG tablet Take 1-2 tablets (500-1,000 mg total) by mouth 2 (two) times daily. 08/19/23   Nira Conn, MD  metoprolol tartrate (LOPRESSOR) 25 MG tablet TAKE 1/2 TABLET BY MOUTH EVERY MORNING 05/28/23   [name], NP  Multiple Vitamins-Minerals (MULTIVITAMIN ADULT PO) Take by mouth daily.    [provider]  omeprazole (PRILOSEC) 40 MG capsule Take 1 capsule (40 mg total) by mouth daily. 01/21/22   Si Gaul, MD  OVER THE COUNTER MEDICATION Beet Root supplement    [provider]  POTASSIUM PO Take by mouth.    [provider]  Probiotic Product (PROBIOTIC-10 PO) Take by mouth. 1 caples    [provider]      Allergies    Benadryl [diphenhydramine hcl] and Chloroxine    Review of Systems   Review of Systems  Physical Exam Updated Vital Signs BP (!) 144/87 (BP Location: Left Arm)   Pulse 78   Temp 99.2 F (37.3 C) (Oral)   Resp 16   Ht 5\' 9"  (1.753 m)   Wt 97.5 kg   SpO2 100%   BMI 31.75 kg/m  Physical Exam Vitals and nursing Shaffer reviewed.  Constitutional:  Appearance: She is well-developed.  HENT:     Head: Normocephalic and atraumatic.  Eyes:     Conjunctiva/sclera: Conjunctivae normal.  Pulmonary:     Effort: No respiratory distress.  Genitourinary:    Comments: External GU exam performed with chaperone.  Patient with a small area of erythema and mild skin breakdown in the left anterior labia without induration or fluctuance.  It is in the vicinity of the Bartholin's gland but this is not appear to be a cyst. Musculoskeletal:     Cervical back: Normal range of motion and neck supple.  Skin:    General: Skin is warm and dry.  Neurological:     Mental Status: She is alert.     ED Results / Procedures / Treatments   Labs (all labs ordered are listed, but only  abnormal results are displayed) Labs Reviewed - No data to display  EKG None  Radiology No results found.  Procedures Procedures    Medications Ordered in ED Medications  doxycycline (VIBRA-TABS) tablet 100 mg (has no administration in time range)    ED Course/ Medical Decision Making/ A&P    Patient seen and examined. History obtained directly from patient.   Labs/EKG: None ordered  Imaging: None ordered  Medications/Fluids: Ordered: Oral doxycycline  Most recent vital signs reviewed and are as follows: BP (!) 144/87 (BP Location: Left Arm)   Pulse 78   Temp 99.2 F (37.3 C) (Oral)   Resp 16   Ht 5\' 9"  (1.753 m)   Wt 97.5 kg   SpO2 100%   BMI 31.75 kg/m   Initial impression: Resolving labial cutaneous abscess with some residual cellulitis  Home treatment plan: Continue soaks, OTC meds for pain  The patient was urged to return to the Emergency Department urgently with worsening pain, swelling, expanding erythema especially if it streaks away from the affected area, fever, or if they have any other concerns.   The patient was urged to return to the Emergency Department or go to their PCP in 48 hours for wound recheck if the area is not significantly improved.                                Medical Decision Making Risk Prescription drug management.   Patient with labial abscess, residual cellulitis.  No Bartholin's gland abscess.  No cutaneous abscess noted.  No indication for I&D at this time.  Given continued drainage and pain, feel that course of antibiotics would be warranted.  Patient has had similar symptoms in the past which responded well.        Final Clinical Impression(s) / ED Diagnoses Final diagnoses:  Cellulitis of labia majora    Rx / DC Orders ED Discharge Orders          Ordered    doxycycline (VIBRAMYCIN) 100 MG capsule  2 times daily        10/17/23 2201              Renne Crigler, PA-C 10/17/23 2204     Glyn Ade, MD 10/17/23 2239

## 2023-10-17 NOTE — Discharge Instructions (Signed)
 Please read and follow all provided instructions.  Your diagnoses today include:  1. Cellulitis of labia majora     Tests performed today include: Vital signs. See below for your results today.   Medications prescribed:  Doxycycline - antibiotic  You have been prescribed an antibiotic medicine: take the entire course of medicine even if you are feeling better. Stopping early can cause the antibiotic not to work.  Take any prescribed medications only as directed.   Home care instructions:  Follow any educational materials contained in this packet. Keep affected area above the level of your heart when possible. Wash area gently twice a day with warm soapy water. Do not apply alcohol or hydrogen peroxide. Cover the area if it draining or weeping.   Follow-up instructions: Return to the Emergency Department in 48 hours for a recheck if your symptoms are not significantly improved.   Return instructions:  Return to the Emergency Department if you have: Fever Worsening symptoms Worsening pain Worsening swelling Redness of the skin that moves away from the affected area, especially if it streaks away from the affected area  Any other emergent concerns  Your vital signs today were: BP (!) 144/87 (BP Location: Left Arm)   Pulse 78   Temp 99.2 F (37.3 C) (Oral)   Resp 16   Ht 5\' 9"  (1.753 m)   Wt 97.5 kg   SpO2 100%   BMI 31.75 kg/m  If your blood pressure (BP) was elevated above 135/85 this visit, please have this repeated by your doctor within one month. --------------

## 2023-10-17 NOTE — ED Triage Notes (Signed)
 Pt coming to ER with report of shaving vagina a month ago, developed an abscess that drained. Area has grown again, has not come to a head and will not drain per patient. Is painful. Pt denies fever. No urinary symptoms.

## 2023-10-19 ENCOUNTER — Telehealth: Payer: Self-pay

## 2023-10-19 NOTE — Transitions of Care (Post Inpatient/ED Visit) (Signed)
   10/19/2023  Name: Kellie Shaffer MRN: [id number] DOB: 1990-11-07  Today's TOC FU Call Status: Today's TOC FU Call Status:: Unsuccessful Call (1st Attempt) Unsuccessful Call (1st Attempt) Date: 10/19/23  Attempted to reach the patient regarding the most recent Inpatient/ED visit.  Follow Up Plan: Additional outreach attempts will be made to reach the patient to complete the Transitions of Care (Post Inpatient/ED visit) call.   Signature  [employer], [place]

## 2023-10-28 ENCOUNTER — Encounter: Payer: Self-pay | Admitting: Nurse Practitioner

## 2023-10-28 ENCOUNTER — Ambulatory Visit (INDEPENDENT_AMBULATORY_CARE_PROVIDER_SITE_OTHER): Payer: Self-pay | Admitting: Nurse Practitioner

## 2023-10-28 VITALS — BP 136/84 | HR 67 | Temp 97.0°F | Wt 218.0 lb

## 2023-10-28 DIAGNOSIS — N762 Acute vulvitis: Secondary | ICD-10-CM | POA: Diagnosis not present

## 2023-10-28 MED ORDER — MUPIROCIN 2 % EX OINT: 1.0000 | TOPICAL_OINTMENT | Freq: Two times a day (BID) | CUTANEOUS | 0 refills | Status: DC

## 2023-10-28 MED ORDER — CEPHALEXIN 500 MG PO CAPS: 500.0000 mg | ORAL_CAPSULE | Freq: Three times a day (TID) | ORAL | 0 refills | Status: AC

## 2023-10-28 NOTE — Progress Notes (Signed)
 Subjective   Patient ID: Kellie Shaffer, female    DOB: 07-30-1991, 33 y.o.   MRN: [id number]  Chief Complaint  Patient presents with   Hospitalization Follow-up    Referring provider: [name], NP  Kellie Shaffer is a 33 y.o. 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  Sinus rhythm               no arrhythmias or significant PACs/PVCs. No date: Hypercholesteremia 03/2020: Hyperglycemia No date: Hypertension No date: Hyperthyroidism 09/04/2016: Iron deficiency anemia No date: Peripheral vertigo No date: Recurrent upper respiratory infection (URI) 11/22/2019: Seasonal allergies No date: Urticaria 11/22/2019: Vertigo No date: Vertigo   HPI  Patient presents today for ED follow up. She was seen for cellulitis / abscess to left labia. She was given doxycycline but states that provided minimal relief. We will trial keflex and refer patient to ob/gyn. Denies f/c/s, n/v/d, hemoptysis, PND, leg swelling Denies chest pain or edema    Allergies  Allergen Reactions   Benadryl [Diphenhydramine Hcl] Anaphylaxis and Hives   Chloroxine Hives    Clorox    Immunization History  Administered Date(s) Administered   Influenza,inj,Quad PF,6+ Mos 10/04/2018, 06/21/2019   Pneumococcal-Unspecified 08/17/2009   Tdap 08/17/2010    Tobacco History: Social History   Tobacco Use  Smoking Status Former   Current packs/day: 0.00   Average packs/day: 0.3 packs/day for 11.7 years (3.0 ttl pk-yrs)   Types: Cigarettes   Start date: 2011    Quit date: 2021   Years since quitting: 4.1  Smokeless Tobacco Never   Counseling given: Not Answered   Outpatient Encounter Medications as of 10/28/2023  Medication Sig   atorvastatin (LIPITOR) 40 MG tablet TAKE 1 TABLET(40 MG) BY MOUTH DAILY   Biotin 46962 MCG TABS Take 1,000 mcg by mouth daily.    busPIRone (BUSPAR) 10 MG tablet Take 1 tablet (10 mg total) by mouth 2 (two) times daily.   cephALEXin (KEFLEX) 500 MG capsule Take 1 capsule (500 mg total) by mouth 3 (three) times daily for 10 days.   cetirizine (ZYRTEC) 10 MG tablet Take 1 tablet (10 mg total) by mouth daily as needed for allergies.   CINNAMON PO Take by mouth.   FEROSUL 325 (65 Fe) MG tablet TAKE 1 TABLET BY MOUTH TWICE DAILY WITH MEALS.   Ginger, Zingiber officinalis, (GINGER PO) Take by mouth.   lisinopril (ZESTRIL) 5 MG tablet TAKE 1 TABLET(5 MG) BY MOUTH DAILY   methimazole (TAPAZOLE) 5 MG tablet Take 1 tablet (5 mg total) by mouth every other day.   methocarbamol (ROBAXIN) 500 MG tablet Take 1-2 tablets (500-1,000 mg total) by mouth 2 (two) times daily.   metoprolol tartrate (LOPRESSOR) 25 MG tablet TAKE 1/2 TABLET BY MOUTH EVERY MORNING   Multiple Vitamins-Minerals (MULTIVITAMIN ADULT PO) Take by mouth daily.   mupirocin ointment (BACTROBAN) 2 % Apply 1 Application topically 2 (two) times daily.   omeprazole (PRILOSEC) 40 MG capsule Take 1 capsule (40 mg total) by mouth daily.   OVER THE COUNTER MEDICATION Beet Root supplement  POTASSIUM PO Take by mouth.   Probiotic Product (PROBIOTIC-10 PO) Take by mouth. 1 caples   doxycycline (VIBRAMYCIN) 100 MG capsule Take 1 capsule (100 mg total) by mouth 2 (two) times daily.   medroxyPROGESTERone (DEPO-PROVERA) 150 MG/ML injection Inject 1 mL (150 mg total) into the muscle once for 1 dose.   No facility-administered encounter medications on file as of 10/28/2023.    Review of Systems  Review of Systems  Constitutional: Negative.   HENT: Negative.     Cardiovascular: Negative.   Gastrointestinal: Negative.   Allergic/Immunologic: Negative.   Neurological: Negative.   Psychiatric/Behavioral: Negative.       Objective:   BP 136/84   Pulse 67   Temp (!) 97 F (36.1 C)   Wt 218 lb (98.9 kg)   SpO2 100%   BMI 32.19 kg/m   Wt Readings from Last 5 Encounters:  10/28/23 218 lb (98.9 kg)  10/17/23 215 lb (97.5 kg)  08/18/23 207 lb (93.9 kg)  06/28/23 209 lb 14.4 oz (95.2 kg)  06/17/23 207 lb (93.9 kg)     Physical Exam Vitals and nursing note reviewed.  Constitutional:      General: She is not in acute distress.    Appearance: She is well-developed.  Cardiovascular:     Rate and Rhythm: Normal rate and regular rhythm.  Pulmonary:     Effort: Pulmonary effort is normal.     Breath sounds: Normal breath sounds.  Genitourinary:      Comments: Small open area noted. Appears well healing.  Neurological:     Mental Status: She is alert and oriented to person, place, and time.       Assessment & Plan:   Cellulitis of labia majora -     Cephalexin; Take 1 capsule (500 mg total) by mouth 3 (three) times daily for 10 days.  Dispense: 30 capsule; Refill: 0 -     Ambulatory referral to Obstetrics / Gynecology -     Mupirocin; Apply 1 Application topically 2 (two) times daily.  Dispense: 22 g; Refill: 0     Return if symptoms worsen or fail to improve.   [name], NP 10/28/2023

## 2023-10-28 NOTE — Patient Instructions (Signed)
 1. Cellulitis of labia majora (Primary)  - cephALEXin (KEFLEX) 500 MG capsule; Take 1 capsule (500 mg total) by mouth 3 (three) times daily for 10 days.  Dispense: 30 capsule; Refill: 0 - Ambulatory referral to Obstetrics / Gynecology - mupirocin ointment (BACTROBAN) 2 %; Apply 1 Application topically 2 (two) times daily.  Dispense: 22 g; Refill: 0

## 2023-11-21 ENCOUNTER — Other Ambulatory Visit: Payer: Self-pay | Admitting: Nurse Practitioner

## 2023-11-21 DIAGNOSIS — D508 Other iron deficiency anemias: Secondary | ICD-10-CM

## 2023-11-22 ENCOUNTER — Ambulatory Visit (INDEPENDENT_AMBULATORY_CARE_PROVIDER_SITE_OTHER): Payer: Self-pay | Admitting: Nurse Practitioner

## 2023-11-22 ENCOUNTER — Encounter: Payer: Self-pay | Admitting: Nurse Practitioner

## 2023-11-22 VITALS — BP 140/74 | HR 68 | Temp 98.4°F | Wt 217.8 lb

## 2023-11-22 DIAGNOSIS — Z13220 Encounter for screening for lipoid disorders: Secondary | ICD-10-CM

## 2023-11-22 DIAGNOSIS — E119 Type 2 diabetes mellitus without complications: Secondary | ICD-10-CM

## 2023-11-22 LAB — POCT GLYCOSYLATED HEMOGLOBIN (HGB A1C): Hemoglobin A1C: 6.3 % — AB (ref 4.0–5.6)

## 2023-11-22 NOTE — Progress Notes (Signed)
 Subjective   Patient ID: Kellie Shaffer, female    DOB: 11-21-1990, 33 y.o.   MRN: [id number]  Chief Complaint  Patient presents with   Diabetes    Patient stated that she have not been checking her numbers at home    Referring provider: [name], NP  Kellie Shaffer is a 33 y.o. 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  Sinus rhythm               no arrhythmias or significant PACs/PVCs. No date: Hypercholesteremia 03/2020: Hyperglycemia No date: Hypertension No date: Hyperthyroidism 09/04/2016: Iron deficiency anemia No date: Peripheral vertigo No date: Recurrent upper respiratory infection (URI) 11/22/2019: Seasonal allergies No date: Urticaria 11/22/2019: Vertigo No date: Vertigo   HPI  Patient presents today for follow-up visit.  Overall she has been doing well with no new issues or concerns.  Patient is followed by hematology for anemia.  Patient is followed by endocrinology for thyroid disorder.  Patient does have an established OB/GYN. Denies f/c/s, n/v/d, hemoptysis, PND, leg swelling. Denies chest pain or edema.    Allergies  Allergen Reactions   Benadryl [Diphenhydramine Hcl] Anaphylaxis and Hives   Chloroxine Hives    Clorox    Immunization History  Administered Date(s) Administered   Influenza,inj,Quad PF,6+ Mos 10/04/2018, 06/21/2019   Pneumococcal-Unspecified 08/17/2009   Tdap 08/17/2010    Tobacco History: Social History   Tobacco Use  Smoking Status Former   Current packs/day: 0.00    Average packs/day: 0.3 packs/day for 11.7 years (3.0 ttl pk-yrs)   Types: Cigarettes   Start date: 2011   Quit date: 2021   Years since quitting: 4.2  Smokeless Tobacco Never   Counseling given: Not Answered   Outpatient Encounter Medications as of 11/22/2023  Medication Sig   atorvastatin (LIPITOR) 40 MG tablet TAKE 1 TABLET(40 MG) BY MOUTH DAILY   Biotin 86578 MCG TABS Take 1,000 mcg by mouth daily.    busPIRone (BUSPAR) 10 MG tablet Take 1 tablet (10 mg total) by mouth 2 (two) times daily.   cetirizine (ZYRTEC) 10 MG tablet Take 1 tablet (10 mg total) by mouth daily as needed for allergies.   CINNAMON PO Take by mouth.   FEROSUL 325 (65 Fe) MG tablet TAKE 1 TABLET BY MOUTH TWICE DAILY WITH MEALS.   Ginger, Zingiber officinalis, (GINGER PO) Take by mouth.   lisinopril (ZESTRIL) 5 MG tablet TAKE 1 TABLET(5 MG) BY MOUTH DAILY   methimazole (TAPAZOLE) 5 MG tablet Take 1 tablet (5 mg total) by mouth every other day.   metoprolol tartrate (LOPRESSOR) 25 MG tablet TAKE 1/2 TABLET BY MOUTH EVERY MORNING   Multiple Vitamins-Minerals (MULTIVITAMIN ADULT PO) Take by mouth daily.   omeprazole (PRILOSEC) 40 MG capsule Take 1 capsule (40 mg total) by mouth daily.   OVER THE COUNTER MEDICATION Beet Root supplement   POTASSIUM PO Take by mouth.   Probiotic Product (PROBIOTIC-10 PO) Take by mouth. 1 caples   doxycycline (VIBRAMYCIN) 100 MG capsule Take 1 capsule (100 mg total) by mouth 2 (two)  times daily.   medroxyPROGESTERone (DEPO-PROVERA) 150 MG/ML injection Inject 1 mL (150 mg total) into the muscle once for 1 dose.   methocarbamol (ROBAXIN) 500 MG tablet Take 1-2 tablets (500-1,000 mg total) by mouth 2 (two) times daily.   mupirocin ointment (BACTROBAN) 2 % Apply 1 Application topically 2 (two) times daily.   No facility-administered encounter medications on file as of 11/22/2023.    Review of Systems  Review of Systems  Constitutional: Negative.   HENT: Negative.    Cardiovascular:  Negative.   Gastrointestinal: Negative.   Allergic/Immunologic: Negative.   Neurological: Negative.   Psychiatric/Behavioral: Negative.       Objective:   BP (!) 140/74   Pulse 68   Temp 98.4 F (36.9 C) (Oral)   Wt 217 lb 12.8 oz (98.8 kg)   SpO2 100%   BMI 32.16 kg/m   Wt Readings from Last 5 Encounters:  11/22/23 217 lb 12.8 oz (98.8 kg)  10/28/23 218 lb (98.9 kg)  10/17/23 215 lb (97.5 kg)  08/18/23 207 lb (93.9 kg)  06/28/23 209 lb 14.4 oz (95.2 kg)     Physical Exam Vitals and nursing note reviewed.  Constitutional:      General: She is not in acute distress.    Appearance: She is well-developed.  Cardiovascular:     Rate and Rhythm: Normal rate and regular rhythm.  Pulmonary:     Effort: Pulmonary effort is normal.     Breath sounds: Normal breath sounds.  Neurological:     Mental Status: She is alert and oriented to person, place, and time.       Assessment & Plan:   Lipid screening -     Lipid panel  Hemoglobin A1C between 7% and 9% indicating borderline diabetic control (HCC) -     Microalbumin / creatinine urine ratio -     POCT glycosylated hemoglobin (Hb A1C) -     CBC -     Comprehensive metabolic panel with GFR -     Lipid panel     Return in about 6 months (around 05/23/2024).   [name], NP 11/22/2023

## 2023-11-22 NOTE — Patient Instructions (Signed)
 1. Hemoglobin A1C between 7% and 9% indicating borderline diabetic control (HCC)  - Urine Albumin/Creatinine with ratio (send out) [LAB689] - POCT glycosylated hemoglobin (Hb A1C) - CBC - Comprehensive metabolic panel with GFR - Lipid Panel  2. Lipid screening (Primary)  - Lipid Panel

## 2023-11-23 LAB — LIPID PANEL
Chol/HDL Ratio: 3 ratio (ref 0.0–4.4)
Cholesterol, Total: 111 mg/dL (ref 100–199)
HDL: 37 mg/dL — ABNORMAL LOW (ref >39)
LDL Chol Calc (NIH): 61 mg/dL (ref 0–99)
Triglycerides: 58 mg/dL (ref 0–149)
VLDL Cholesterol Cal: 13 mg/dL (ref 5–40)

## 2023-11-23 LAB — CBC
Hematocrit: 37.9 % (ref 34.0–46.6)
Hemoglobin: 12.6 g/dL (ref 11.1–15.9)
MCH: 29 pg (ref 26.6–33.0)
MCHC: 33.2 g/dL (ref 31.5–35.7)
MCV: 87 fL (ref 79–97)
Platelets: 147 10*3/uL — ABNORMAL LOW (ref 150–450)
RBC: 4.35 x10E6/uL (ref 3.77–5.28)
RDW: 12.5 % (ref 11.7–15.4)
WBC: 6.3 10*3/uL (ref 3.4–10.8)

## 2023-11-23 LAB — COMPREHENSIVE METABOLIC PANEL WITH GFR
ALT: 34 IU/L — ABNORMAL HIGH (ref 0–32)
AST: 17 IU/L (ref 0–40)
Albumin: 4.5 g/dL (ref 3.9–4.9)
Alkaline Phosphatase: 70 IU/L (ref 44–121)
BUN/Creatinine Ratio: 20 (ref 9–23)
BUN: 15 mg/dL (ref 6–20)
Bilirubin Total: 0.7 mg/dL (ref 0.0–1.2)
CO2: 20 mmol/L (ref 20–29)
Calcium: 9.8 mg/dL (ref 8.7–10.2)
Chloride: 105 mmol/L (ref 96–106)
Creatinine, Ser: 0.75 mg/dL (ref 0.57–1.00)
Globulin, Total: 3.1 g/dL (ref 1.5–4.5)
Glucose: 106 mg/dL — ABNORMAL HIGH (ref 70–99)
Potassium: 4.1 mmol/L (ref 3.5–5.2)
Sodium: 140 mmol/L (ref 134–144)
Total Protein: 7.6 g/dL (ref 6.0–8.5)
eGFR: 108 mL/min/{1.73_m2} (ref >59)

## 2023-11-24 LAB — MICROALBUMIN / CREATININE URINE RATIO
Creatinine, Urine: 139.9 mg/dL
Microalb/Creat Ratio: 16 mg/g{creat} (ref 0–29)
Microalbumin, Urine: 21.7 ug/mL

## 2023-11-29 ENCOUNTER — Ambulatory Visit (INDEPENDENT_AMBULATORY_CARE_PROVIDER_SITE_OTHER): Payer: Commercial Managed Care - PPO

## 2023-11-29 DIAGNOSIS — Z3042 Encounter for surveillance of injectable contraceptive: Secondary | ICD-10-CM

## 2023-11-29 MED ORDER — MEDROXYPROGESTERONE ACETATE 150 MG/ML IM SUSY
150.0000 mg | PREFILLED_SYRINGE | Freq: Once | INTRAMUSCULAR | Status: AC
  Administered 2023-11-29: 150 mg via INTRAMUSCULAR

## 2023-12-02 ENCOUNTER — Encounter: Payer: Self-pay | Admitting: Nurse Practitioner

## 2023-12-02 ENCOUNTER — Telehealth (INDEPENDENT_AMBULATORY_CARE_PROVIDER_SITE_OTHER): Admitting: Nurse Practitioner

## 2023-12-02 ENCOUNTER — Ambulatory Visit: Payer: Self-pay

## 2023-12-02 VITALS — Ht 69.0 in | Wt 217.0 lb

## 2023-12-02 DIAGNOSIS — H1031 Unspecified acute conjunctivitis, right eye: Secondary | ICD-10-CM | POA: Diagnosis not present

## 2023-12-02 MED ORDER — OFLOXACIN 0.3 % OP SOLN: 1.0000 [drp] | Freq: Four times a day (QID) | OPHTHALMIC | 0 refills | Status: AC

## 2023-12-02 NOTE — Telephone Encounter (Signed)
 This encounter was created in error - please disregard.

## 2023-12-02 NOTE — Telephone Encounter (Signed)
 2nd attempt to contact patient to review eye sx and medication refill on eye drops/ medication. No answer, [facility] [phone].

## 2023-12-02 NOTE — Telephone Encounter (Signed)
  Chief Complaint: Eye Symptoms  Symptoms: Yellow Discharge, Itching, Pink Discoloration to Sclera  Frequency: Overnight  Pertinent Negatives: Patient denies fever, cough, runny nose, or visual changes  Disposition: [] ED /[] Urgent Care (no appt availability in office) / [x] Appointment(In office/virtual)/ []  [facility]/ [] Home Care/ [] Refused Recommended Disposition /[] [facility]/ []  Follow-up with PCP Additional Notes: MH is being triaged for yellow discharge around the eye and pinkish discoloration to the sclera. The patient also complains of itching to the eye. Denies a fever, runny nose, or cough. The patient notes slight swelling to the upper eyelid of the affected eye, but none to the lower. Virtual visit made for PCP at 11:00 AM today.   Reason for Disposition  [1] Eye with yellow or green discharge, or eyelashes stick together AND [2] NO PCP standing order to call in antibiotic eye drops   (Exception: [place]; continue triage.)  Answer Assessment - Initial Assessment Questions 1. EYE DISCHARGE: "Is the discharge in one or both eyes?" "What color is it?" "How much is there?" "When did the discharge start?"  Right Eye, Thick yellowish discharge around the eye      2. REDNESS OF SCLERA: "Is the redness in one or both eyes?" "When did the redness start?"      Pink, moreso than Red  3. EYELIDS: "Are the eyelids red or swollen?" If Yes, ask: "How much?"      Upper eyelid, slightly  4. VISION: "Is there any difficulty seeing clearly?"       No  5. PAIN: "Is there any pain? If Yes, ask: "How bad is it?" (Scale 1-10; or mild, moderate, severe)    - MILD (1-3): doesn't interfere with normal activities     - MODERATE (4-7): interferes with normal activities or awakens from sleep    - SEVERE (8-10): excruciating pain, unable to do any normal activities       No  6. CONTACT LENS: "Do you wear contacts?"     No, Glasses  7. OTHER SYMPTOMS: "Do you have any other  symptoms?" (e.g., fever, runny nose, cough)     No  8. PREGNANCY: "Is there any chance you are pregnant?" "When was your last menstrual period?"     No, LMP  Protocols used: Eye - Pus or Discharge-A-AH

## 2023-12-02 NOTE — Telephone Encounter (Signed)
 1st attempt- Attempted to call to triage eye symptoms. Received VMB. LVM to return call. Will continue to attempt.    Copied from CRM [id number]. Topic: Appointments - Appointment Scheduling >> Dec 02, 2023  8:43 AM Emylou G wrote: Patient called would like eye drop refill, but no available appts til May?  Pls call her [phone]

## 2023-12-02 NOTE — Progress Notes (Signed)
 Virtual Visit via Video Note  I connected with Kellie Shaffer on 12/02/23 at 11:00 AM EDT by a video enabled telemedicine application and verified that I am speaking with the correct person using two identifiers.  Location: Patient: home Provider: office   I discussed the limitations of evaluation and management by telemedicine and the availability of in person appointments. The patient expressed understanding and agreed to proceed.  History of Present Illness:  Patient presents today for an acute visit through video visit.  She states that her symptoms started today.  She woke up this morning with yellow discharge from her right eye.  She states that her eye was matted shut.  Her eye has been red and irritated today. Denies f/c/s, n/v/d, hemoptysis, PND, leg swelling Denies chest pain or edema     Observations/Objective: Today's Vitals   12/02/23 1023  Weight: 217 lb (98.4 kg)  Height: 5\' 9"  (1.753 m)   Body mass index is 32.05 kg/m.    Assessment and Plan:  1. Acute conjunctivitis of right eye, unspecified acute conjunctivitis type (Primary)  - ofloxacin (OCUFLOX) 0.3 % ophthalmic solution; Place 1 drop into the right eye 4 (four) times daily.  Dispense: 5 mL; Refill: 0    I discussed the assessment and treatment plan with the patient. The patient was provided an opportunity to ask questions and all were answered. The patient agreed with the plan and demonstrated an understanding of the instructions.   The patient was advised to call back or seek an in-person evaluation if the symptoms worsen or if the condition fails to improve as anticipated.  I provided 23 minutes of non-face-to-face time during this encounter.   Jerrlyn Morel, NP

## 2023-12-03 ENCOUNTER — Other Ambulatory Visit: Payer: Self-pay | Admitting: Nurse Practitioner

## 2023-12-14 ENCOUNTER — Ambulatory Visit: Payer: Self-pay

## 2023-12-14 NOTE — Telephone Encounter (Signed)
 Chief Complaint: Foot Pain, Right Symptoms: limping, swelling Frequency: x 2-3 weeks Pertinent Negatives: Patient denies fever, redness, rash Disposition: [] ED /[] Urgent Care (no appt availability in office) / [x] Appointment(In office/virtual)/ []  [facility]/ [] Home Care/ [] Refused Recommended Disposition /[] [facility]/ []  Follow-up with PCP Additional Notes: Pt reports she noticed pain in her right foot about 2-3 weeks ago. Pt reports she is limping and has been wearing an OTC brace with little to no improvement. OV scheduled. This RN educated pt on home care, new-worsening symptoms, when to call back/seek emergent care. Pt verbalized understanding and agrees to plan.    Copied from CRM [id number]. Topic: Clinical - Red Word Triage >> Dec 14, 2023  2:21 PM [employer] wrote: Red Word that prompted transfer to Nurse Triage: Right foot pain. Pain level 9. Swelling in foot. Patient is wanting to change her appointment information for tomorrow to get her foot checked out. She stated she already had her 6 mo follow up and A1C checked Reason for Disposition  [1] Swollen foot AND [2] no fever  (Exceptions: localized bump from bunions, calluses, insect bite, sting)  Answer Assessment - Initial Assessment Questions 1. ONSET: "When did the pain start?"      X 2-3 weeks ago 2. LOCATION: "Where is the pain located?"      RIght 3. PAIN: "How bad is the pain?"    (Scale 1-10; or mild, moderate, severe)  - MILD (1-3): doesn't interfere with normal activities.   - MODERATE (4-7): interferes with normal activities (e.g., work or school) or awakens from sleep, limping.   - SEVERE (8-10): excruciating pain, unable to do any normal activities, unable to walk.      9/10 5. CAUSE: "What do you think is causing the foot pain?"     Unknown  Protocols used: Foot Pain-A-AH

## 2023-12-15 ENCOUNTER — Ambulatory Visit (INDEPENDENT_AMBULATORY_CARE_PROVIDER_SITE_OTHER): Payer: Self-pay | Admitting: Nurse Practitioner

## 2023-12-15 ENCOUNTER — Encounter: Payer: Self-pay | Admitting: Nurse Practitioner

## 2023-12-15 VITALS — BP 146/81 | HR 73 | Temp 98.2°F | Wt 221.4 lb

## 2023-12-15 DIAGNOSIS — M79671 Pain in right foot: Secondary | ICD-10-CM | POA: Diagnosis not present

## 2023-12-15 DIAGNOSIS — M79674 Pain in right toe(s): Secondary | ICD-10-CM

## 2023-12-15 MED ORDER — CEPHALEXIN 500 MG PO CAPS: 500.0000 mg | ORAL_CAPSULE | Freq: Two times a day (BID) | ORAL | 0 refills | Status: AC

## 2023-12-15 NOTE — Progress Notes (Signed)
 Subjective   Patient ID: Kellie Shaffer, female    DOB: 1991-05-22, 33 y.o.   MRN: [id number]  Chief Complaint  Patient presents with   Foot Pain    Patient stated that she stepped on something and her foot started swelling x 52month    Referring provider: Jerrlyn Morel, NP  Kellie Shaffer is a 33 y.o. 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  Sinus rhythm               no arrhythmias or significant PACs/PVCs. No date: Hypercholesteremia 03/2020: Hyperglycemia No date: Hypertension No date: Hyperthyroidism 09/04/2016: Iron deficiency anemia No date: Peripheral vertigo No date: Recurrent upper respiratory infection (URI) 11/22/2019: Seasonal allergies No date: Urticaria 11/22/2019: Vertigo No date: Vertigo   HPI  Patient presents today with right great toe pain.  She states that she stepped on something on her carpet 1 month ago and she has been having pain in her toe since that time.  She states that her foot has been swelling for the past 3 weeks.  We will trial Keflex  and place referral to podiatry.  Upon exam there are no open areas to her toe.  There does not appear to be anything embedded in her toe. Denies f/c/s, n/v/d, hemoptysis, PND, leg swelling. Denies chest pain or edema.       Allergies  Allergen Reactions   Benadryl [Diphenhydramine Hcl] Anaphylaxis and Hives   Chloroxine Hives    Clorox    Immunization History  Administered Date(s) Administered   Influenza,inj,Quad PF,6+ Mos 10/04/2018, 06/21/2019    Pneumococcal-Unspecified 08/17/2009   Tdap 08/17/2010    Tobacco History: Social History   Tobacco Use  Smoking Status Former   Current packs/day: 0.00   Average packs/day: 0.3 packs/day for 11.7 years (3.0 ttl pk-yrs)   Types: Cigarettes   Start date: 2011   Quit date: 2021   Years since quitting: 4.3  Smokeless Tobacco Never   Counseling given: Not Answered   Outpatient Encounter Medications as of 12/15/2023  Medication Sig   atorvastatin  (LIPITOR) 40 MG tablet TAKE 1 TABLET(40 MG) BY MOUTH DAILY   Biotin  10000 MCG TABS Take 1,000 mcg by mouth daily.    busPIRone  (BUSPAR ) 10 MG tablet TAKE 1 TABLET(10 MG) BY MOUTH TWICE DAILY   cephALEXin  (KEFLEX ) 500 MG capsule Take 1 capsule (500 mg total) by mouth 2 (two) times daily for 10 days.   cetirizine  (ZYRTEC ) 10 MG tablet Take 1 tablet (10 mg total) by mouth daily as needed for allergies.   CINNAMON PO Take by mouth.   FEROSUL 325 (65 Fe) MG tablet TAKE 1 TABLET BY MOUTH TWICE DAILY WITH MEALS.   Ginger, Zingiber officinalis, (GINGER PO) Take by mouth.   lisinopril  (ZESTRIL ) 5 MG tablet TAKE 1 TABLET(5 MG) BY MOUTH DAILY   methimazole  (TAPAZOLE ) 5 MG tablet Take 1 tablet (5 mg total) by mouth every other day.   metoprolol  tartrate (LOPRESSOR ) 25 MG tablet TAKE 1/2 TABLET BY MOUTH EVERY MORNING   Multiple Vitamins-Minerals (MULTIVITAMIN ADULT PO) Take by  mouth daily.   ofloxacin  (OCUFLOX ) 0.3 % ophthalmic solution Place 1 drop into the right eye 4 (four) times daily.   omeprazole  (PRILOSEC) 40 MG capsule Take 1 capsule (40 mg total) by mouth daily.   OVER THE COUNTER MEDICATION Beet Root supplement   POTASSIUM PO Take by mouth.   Probiotic Product (PROBIOTIC-10 PO) Take by mouth. 1 caples   doxycycline  (VIBRAMYCIN ) 100 MG capsule Take 1 capsule (100 mg total) by mouth 2 (two) times daily.   medroxyPROGESTERone  (DEPO-PROVERA ) 150 MG/ML injection Inject 1 mL (150 mg total) into the muscle once for 1 dose.   methocarbamol  (ROBAXIN )  500 MG tablet Take 1-2 tablets (500-1,000 mg total) by mouth 2 (two) times daily.   mupirocin  ointment (BACTROBAN ) 2 % Apply 1 Application topically 2 (two) times daily.   No facility-administered encounter medications on file as of 12/15/2023.    Review of Systems  Review of Systems  Constitutional: Negative.   HENT: Negative.    Cardiovascular: Negative.   Gastrointestinal: Negative.   Allergic/Immunologic: Negative.   Neurological: Negative.   Psychiatric/Behavioral: Negative.       Objective:   BP (!) 146/81   Pulse 73   Temp 98.2 F (36.8 C) (Oral)   Wt 221 lb 6.4 oz (100.4 kg)   SpO2 100%   BMI 32.70 kg/m   Wt Readings from Last 5 Encounters:  12/15/23 221 lb 6.4 oz (100.4 kg)  12/02/23 217 lb (98.4 kg)  11/22/23 217 lb 12.8 oz (98.8 kg)  10/28/23 218 lb (98.9 kg)  10/17/23 215 lb (97.5 kg)     Physical Exam Vitals and nursing note reviewed.  Constitutional:      General: She is not in acute distress.    Appearance: She is well-developed.  Cardiovascular:     Rate and Rhythm: Normal rate and regular rhythm.  Pulmonary:     Effort: Pulmonary effort is normal.     Breath sounds: Normal breath sounds.  Neurological:     Mental Status: She is alert and oriented to person, place, and time.       Assessment & Plan:   Great toe pain, right -     Cephalexin ; Take 1 capsule (500 mg total) by mouth 2 (two) times daily for 10 days.  Dispense: 20 capsule; Refill: 0 -     Ambulatory referral to Podiatry  Right foot pain -     Cephalexin ; Take 1 capsule (500 mg total) by mouth 2 (two) times daily for 10 days.  Dispense: 20 capsule; Refill: 0 -     Ambulatory referral to Podiatry     Return in about 3 months (around 03/15/2024), or if symptoms worsen or fail to improve.   Jerrlyn Morel, NP 12/15/2023

## 2023-12-15 NOTE — Telephone Encounter (Signed)
 Please call and offer the 1 pm appt. Thanks [employer]

## 2023-12-15 NOTE — Telephone Encounter (Signed)
 Sent to admin to call for appt. Kh

## 2023-12-15 NOTE — Patient Instructions (Signed)
 1. Great toe pain, right (Primary)  - cephALEXin  (KEFLEX ) 500 MG capsule; Take 1 capsule (500 mg total) by mouth 2 (two) times daily for 10 days.  Dispense: 20 capsule; Refill: 0 - Ambulatory referral to Podiatry  2. Right foot pain  - cephALEXin  (KEFLEX ) 500 MG capsule; Take 1 capsule (500 mg total) by mouth 2 (two) times daily for 10 days.  Dispense: 20 capsule; Refill: 0 - Ambulatory referral to Podiatry

## 2023-12-20 ENCOUNTER — Ambulatory Visit (INDEPENDENT_AMBULATORY_CARE_PROVIDER_SITE_OTHER)

## 2023-12-20 ENCOUNTER — Encounter: Payer: Self-pay | Admitting: Podiatry

## 2023-12-20 ENCOUNTER — Ambulatory Visit: Admitting: Podiatry

## 2023-12-20 DIAGNOSIS — S90451A Superficial foreign body, right great toe, initial encounter: Secondary | ICD-10-CM

## 2023-12-20 NOTE — Progress Notes (Signed)
  Subjective:  Patient ID: Kellie Shaffer, female    DOB: 03/15/1991,   MRN: [id number]  Chief Complaint  Patient presents with   Toe Pain    Hallux right - moved into a new apartment and felt like she stepped on something, no puncture wound, red swollen initially, PCP eval-rx'd antibiotic-took and now its much better and not even that sore today, appearance it also better   New Patient (Initial Visit)    33 y.o. female presents for concern as above.  . Denies any other pedal complaints. Denies n/v/f/c.   Past Medical History:  Diagnosis Date   Abnormal Pap smear of cervix 2019   LSIL, 03-24-23 lgsil HPV HR +   Abortion 12/2011   Allergy    Anemia    Angio-edema    Anxiety    Diabetes mellitus without complication (HCC)    Dizziness 11/22/2019   GERD (gastroesophageal reflux disease)    Hemoglobin A1c less than 7.0% 03/2020   History of ITP    History of palpitations    Cardiac event monitor June 2019: Normal.  Sinus rhythm no arrhythmias or significant PACs/PVCs.   Hypercholesteremia    Hyperglycemia 03/2020   Hypertension    Hyperthyroidism    Iron deficiency anemia 09/04/2016   Peripheral vertigo    Recurrent upper respiratory infection (URI)    Seasonal allergies 11/22/2019   Urticaria    Vertigo 11/22/2019   Vertigo     Objective:  Physical Exam: Vascular: DP/PT pulses 2/4 bilateral. CFT <3 seconds. Normal hair growth on digits. No edema.  Skin. No lacerations or abrasions bilateral feet.  Musculoskeletal: MMT 5/5 bilateral lower extremities in DF, PF, Inversion and Eversion. Deceased ROM in DF of ankle joint. No foreign bodies noted no pain to palpation about the great oe Neurological: Sensation intact to light touch.   Assessment:   1. Foreign body of skin of right great toe      Plan:  Patient was evaluated and treated and all questions answered. Possible foreign body and cellutiis resolved.  -Discussed toe appears to be doing well and been taking  antibtioics Advised to finish course -No additional abx indicated.  -Discussed if any worsening redness, pain, fever or chills to call or may need to report to the emergency room. Patient expressed understanding.  -May return to work  Return as needed.    Jennefer Moats, DPM

## 2023-12-28 ENCOUNTER — Other Ambulatory Visit: Payer: Self-pay | Admitting: Nurse Practitioner

## 2023-12-28 DIAGNOSIS — I10 Essential (primary) hypertension: Secondary | ICD-10-CM

## 2023-12-30 ENCOUNTER — Other Ambulatory Visit: Payer: Self-pay | Admitting: Nurse Practitioner

## 2023-12-30 DIAGNOSIS — I10 Essential (primary) hypertension: Secondary | ICD-10-CM

## 2024-01-01 ENCOUNTER — Other Ambulatory Visit: Payer: Self-pay | Admitting: Nurse Practitioner

## 2024-01-03 NOTE — Telephone Encounter (Signed)
 Please advise [facility]

## 2024-01-07 ENCOUNTER — Encounter (HOSPITAL_BASED_OUTPATIENT_CLINIC_OR_DEPARTMENT_OTHER): Payer: Self-pay | Admitting: *Deleted

## 2024-01-07 ENCOUNTER — Emergency Department (HOSPITAL_BASED_OUTPATIENT_CLINIC_OR_DEPARTMENT_OTHER)
Admission: EM | Admit: 2024-01-07 | Discharge: 2024-01-07 | Disposition: A | Discharge status: Home / Self Care | Attending: Emergency Medicine | Admitting: Emergency Medicine

## 2024-01-07 ENCOUNTER — Other Ambulatory Visit: Payer: Self-pay

## 2024-01-07 DIAGNOSIS — H9201 Otalgia, right ear: Secondary | ICD-10-CM | POA: Diagnosis present

## 2024-01-07 DIAGNOSIS — Z79899 Other long term (current) drug therapy: Secondary | ICD-10-CM | POA: Diagnosis not present

## 2024-01-07 DIAGNOSIS — R42 Dizziness and giddiness: Secondary | ICD-10-CM | POA: Insufficient documentation

## 2024-01-07 DIAGNOSIS — H6691 Otitis media, unspecified, right ear: Secondary | ICD-10-CM | POA: Diagnosis not present

## 2024-01-07 DIAGNOSIS — H6690 Otitis media, unspecified, unspecified ear: Secondary | ICD-10-CM

## 2024-01-07 MED ORDER — AMOXICILLIN-POT CLAVULANATE 875-125 MG PO TABS: 1.0000 | ORAL_TABLET | Freq: Two times a day (BID) | ORAL | 0 refills | Status: DC

## 2024-01-07 MED ORDER — AMOXICILLIN-POT CLAVULANATE 875-125 MG PO TABS
1.0000 | ORAL_TABLET | Freq: Once | ORAL | Status: AC
  Administered 2024-01-07: 1 via ORAL
  Filled 2024-01-07: qty 1

## 2024-01-07 NOTE — Discharge Instructions (Addendum)
 You were diagnosed with an ear infection.  You can take Augmentin  2 times per day for 1 week.  Follow-up with your PCP.

## 2024-01-07 NOTE — ED Triage Notes (Signed)
 Here by POV from home for dizziness. H/o vertigo. Could not get in with PCP. Endorses fell asleepin in fron of her [facility] 2d ago, and since has felt dizzy. Also endorses R ear pain, congestion, and light headed. No relief with mucinex .No releif with zyrtec  or saline nasal spray. Alert, NAD, calm, interactive, ambulatory.

## 2024-01-07 NOTE — ED Provider Notes (Signed)
 [place] [facility] AT [facility] Provider Note   CSN: [id number] Arrival date & time: 01/07/24  7425     History  Chief Complaint  Patient presents with   Dizziness    Kellie Shaffer is a 33 y.o. female.  This is a 33 year old female who presents to the emergency room today due to dizziness, pain in her right ear.  She has also had some congestion.  Reviewed the patient's most recent PCP office note.  Reviewed the patient's medications.   Dizziness      Home Medications Prior to Admission medications   Medication Sig Start Date End Date Taking? Authorizing Provider  amoxicillin -clavulanate (AUGMENTIN ) 875-125 MG tablet Take 1 tablet by mouth every 12 (twelve) hours. 01/07/24  Yes Afton Horse T, DO  atorvastatin  (LIPITOR) 40 MG tablet TAKE 1 TABLET(40 MG) BY MOUTH DAILY 12/30/23   Nichols, Tonya S, NP  Biotin  10000 MCG TABS Take 1,000 mcg by mouth daily.     [provider]  busPIRone  (BUSPAR ) 10 MG tablet TAKE 1 TABLET(10 MG) BY MOUTH TWICE DAILY 01/03/24   Nichols, Tonya S, NP  cetirizine  (ZYRTEC ) 10 MG tablet Take 1 tablet (10 mg total) by mouth daily as needed for allergies. 08/21/20   Brian Campanile, MD  CINNAMON PO Take by mouth.    [provider]  FEROSUL 325 (65 Fe) MG tablet TAKE 1 TABLET BY MOUTH TWICE DAILY WITH MEALS. 11/22/23   Nichols, Tonya S, NP  Ginger, Zingiber officinalis, (GINGER PO) Take by mouth.    [provider]  lisinopril  (ZESTRIL ) 5 MG tablet TAKE 1 TABLET(5 MG) BY MOUTH DAILY 12/28/23   Nichols, Tonya S, NP  medroxyPROGESTERone  (DEPO-PROVERA ) 150 MG/ML injection Inject 1 mL (150 mg total) into the muscle once for 1 dose. 03/16/23 12/02/23  Chrzanowski, Jami B, NP  methimazole  (TAPAZOLE ) 5 MG tablet Take 1 tablet (5 mg total) by mouth every other day. 03/18/23   Emilie Harden, MD  metoprolol  tartrate (LOPRESSOR ) 25 MG tablet TAKE 1/2 TABLET BY MOUTH EVERY MORNING 05/28/23   Nichols,  Tonya S, NP  Multiple Vitamins-Minerals (MULTIVITAMIN ADULT PO) Take by mouth daily.    [provider]  ofloxacin  (OCUFLOX ) 0.3 % ophthalmic solution Place 1 drop into the right eye 4 (four) times daily. 12/02/23   Jerrlyn Morel, NP  omeprazole  (PRILOSEC) 40 MG capsule Take 1 capsule (40 mg total) by mouth daily. 01/21/22   Marlene Simas, MD  OVER THE COUNTER MEDICATION Beet Root supplement    [provider]  POTASSIUM PO Take by mouth.    [provider]  Probiotic Product (PROBIOTIC-10 PO) Take by mouth. 1 caples    [provider]      Allergies    Benadryl [diphenhydramine hcl] and Chloroxine    Review of Systems   Review of Systems  Neurological:  Positive for dizziness.    Physical Exam Updated Vital Signs BP (!) 153/87 (BP Location: Left Arm)   Pulse 73   Temp 98.7 F (37.1 C) (Oral)   Resp 18   Wt 98.4 kg   SpO2 100%   BMI 32.05 kg/m  Physical Exam Vitals and nursing note reviewed.  HENT:     Head:     Comments: No sinus tenderness, no mastoid tenderness.    Ears:     Comments: Right tympanic membrane-erythematous, bulging. Eyes:     Pupils: Pupils are equal, round, and reactive to light.     ED  Results / Procedures / Treatments   Labs (all labs ordered are listed, but only abnormal results are displayed) Labs Reviewed - No data to display  EKG None  Radiology No results found.  Procedures Procedures    Medications Ordered in ED Medications  amoxicillin -clavulanate (AUGMENTIN ) 875-125 MG per tablet 1 tablet (1 tablet Oral Given 01/07/24 2241)    ED Course/ Medical Decision Making/ A&P                                 Medical Decision Making 33 year old female here today with right ear pain and dizziness.  Plan -appears to be an otitis media.  Appears bacterial on exam.  Will treat with Augmentin .  No mastoid tenderness, no tenderness in the face.  Considered imaging to assess for facial infection,  however based on exam believe Augmentin  is a reasonable option.  Risk Prescription drug management.           Final Clinical Impression(s) / ED Diagnoses Final diagnoses:  Acute otitis media, unspecified otitis media type    Rx / DC Orders ED Discharge Orders          Ordered    amoxicillin -clavulanate (AUGMENTIN ) 875-125 MG tablet  Every 12 hours        01/07/24 2240              Afton Horse T, DO 01/07/24 2242

## 2024-01-08 ENCOUNTER — Other Ambulatory Visit: Payer: Self-pay | Admitting: Nurse Practitioner

## 2024-01-08 DIAGNOSIS — D508 Other iron deficiency anemias: Secondary | ICD-10-CM

## 2024-01-11 ENCOUNTER — Telehealth: Payer: Self-pay

## 2024-01-11 NOTE — Transitions of Care (Post Inpatient/ED Visit) (Unsigned)
 01/11/2024  Name: Kellie Shaffer MRN: [id number] DOB: [birth year]  Today's TOC FU Call Status:   Patient's Name and Date of Birth confirmed.  Transition Care Management Follow-up Telephone Call Date of Discharge: 01/08/24 Discharge Facility: Other (Non-[facility] Facility) Type of Discharge: Emergency Department How have you been since you were released from the hospital?: Better Any questions or concerns?: No  Items Reviewed: Did you receive and understand the discharge instructions provided?: Yes Medications obtained,verified, and reconciled?: Yes (Medications Reviewed) Any new allergies since your discharge?: No Dietary orders reviewed?: No Do you have support at home?: Yes People in Home [RPT]: alone  Medications Reviewed Today: Medications Reviewed Today     Reviewed by Angelita Bares, CMA (Certified Medical Assistant) on 01/11/24 at 1514  Med List Status: <None>   Medication Order Taking? Sig Documenting Provider Last Dose Status Informant  amoxicillin -clavulanate (AUGMENTIN ) 875-125 MG tablet [id number] Yes Take 1 tablet by mouth every 12 (twelve) hours. Nathanael Baker, DO Taking Active   atorvastatin  (LIPITOR) 40 MG tablet [id number] Yes TAKE 1 TABLET(40 MG) BY MOUTH DAILY Nichols, Tonya S, NP Taking Active   Biotin  10000 MCG TABS [id number] Yes Take 1,000 mcg by mouth daily.  [provider] Taking Active Self  busPIRone  (BUSPAR ) 10 MG tablet [id number] Yes TAKE 1 TABLET(10 MG) BY MOUTH TWICE DAILY Nichols, Tonya S, NP Taking Active   cetirizine  (ZYRTEC ) 10 MG tablet [id number] Yes Take 1 tablet (10 mg total) by mouth daily as needed for allergies. Brian Campanile, MD Taking Active   CINNAMON PO [id number] Yes Take by mouth. [provider] Taking Active   FEROSUL 325 (65 Fe) MG tablet [id number] Yes TAKE 1 TABLET BY MOUTH TWICE DAILY WITH MEALS. Jerrlyn Morel, NP Taking Active   Ginger, Zingiber officinalis, (GINGER PO) [id number] Yes Take by  mouth. [provider] Taking Active   lisinopril  (ZESTRIL ) 5 MG tablet [id number] Yes TAKE 1 TABLET(5 MG) BY MOUTH DAILY Nichols, Tonya S, NP Taking Active   medroxyPROGESTERone  (DEPO-PROVERA ) 150 MG/ML injection [id number]  Inject 1 mL (150 mg total) into the muscle once for 1 dose. Chrzanowski, Jami B, NP  Expired 12/02/23 2359   methimazole  (TAPAZOLE ) 5 MG tablet [id number] Yes Take 1 tablet (5 mg total) by mouth every other day. Emilie Harden, MD Taking Active   metoprolol  tartrate (LOPRESSOR ) 25 MG tablet [id number] Yes TAKE 1/2 TABLET BY MOUTH EVERY MORNING Jerrlyn Morel, NP Taking Active   Multiple Vitamins-Minerals (MULTIVITAMIN ADULT PO) [id number] Yes Take by mouth daily. [provider] Taking Active   ofloxacin  (OCUFLOX ) 0.3 % ophthalmic solution [id number] Yes Place 1 drop into the right eye 4 (four) times daily. Jerrlyn Morel, NP Taking Active   omeprazole  (PRILOSEC) 40 MG capsule [id number] Yes Take 1 capsule (40 mg total) by mouth daily. Marlene Simas, MD Taking Active   OVER THE COUNTER MEDICATION [id number] Yes Beet Root supplement [provider] Taking Active   POTASSIUM PO [id number] Yes Take by mouth. [provider] Taking Active   Probiotic Product (PROBIOTIC-10 PO) [id number] Yes Take by mouth. 1 caples [provider] Taking Active             Home Care and Equipment/Supplies: Were Home Health Services Ordered?: NA Any new equipment or medical supplies ordered?: NA  Functional Questionnaire: Do you need assistance with bathing/showering or dressing?: No Do you need assistance with meal preparation?: No Do you need assistance with eating?: No Do you have difficulty maintaining continence:  No Do you need assistance with getting out of bed/getting out of a chair/moving?: No Do you have difficulty managing or taking your medications?: No  Follow up appointments reviewed: PCP Follow-up appointment confirmed?:  Yes Date of PCP follow-up appointment?: 01/24/24 Follow-up Provider: [facility] Follow-up appointment confirmed?: NA Do you need transportation to your follow-up appointment?: No Do you understand care options if your condition(s) worsen?: Yes-patient verbalized understanding    SIGNATURE Alijah Hyde, RMA

## 2024-01-11 NOTE — Telephone Encounter (Signed)
 Please advise [facility]

## 2024-01-12 ENCOUNTER — Telehealth: Payer: Self-pay | Admitting: Nurse Practitioner

## 2024-01-12 DIAGNOSIS — E0590 Thyrotoxicosis, unspecified without thyrotoxic crisis or storm: Secondary | ICD-10-CM

## 2024-01-12 NOTE — Telephone Encounter (Unsigned)
 Copied from CRM [id number]. Topic: Clinical - Medication Refill >> Jan 12, 2024  5:41 PM Bridgette Campus T wrote: Medication: methimazole  (TAPAZOLE ) 5 MG tablet  Has the patient contacted their pharmacy? Yes (Agent: If no, request that the patient contact the pharmacy for the refill. If patient does not wish to contact the pharmacy document the reason why and proceed with request.) (Agent: If yes, when and what did the pharmacy advise?)  This is the patient's preferred pharmacy:  [id number] Jonette Nestle, [place] - [address] AT [facility] [address] [place] [address] Phone: [phone] Fax: [phone]  Is this the correct pharmacy for this prescription? Yes If no, delete pharmacy and type the correct one.   Has the prescription been filled recently? Yes  Is the patient out of the medication? Yes  Has the patient been seen for an appointment in the last year OR does the patient have an upcoming appointment? Yes  Can we respond through MyChart? Yes  Agent: Please be advised that Rx refills may take up to 3 business days. We ask that you follow-up with your pharmacy.

## 2024-01-13 ENCOUNTER — Telehealth: Payer: Self-pay

## 2024-01-13 ENCOUNTER — Other Ambulatory Visit: Payer: Self-pay

## 2024-01-13 DIAGNOSIS — D508 Other iron deficiency anemias: Secondary | ICD-10-CM

## 2024-01-13 MED ORDER — METHIMAZOLE 5 MG PO TABS
5.0000 mg | ORAL_TABLET | ORAL | 3 refills | Status: DC
Start: 2024-01-13 — End: 2024-06-16

## 2024-01-13 MED ORDER — FERROUS SULFATE 325 (65 FE) MG PO TABS: 325.0000 mg | ORAL_TABLET | Freq: Two times a day (BID) | ORAL | 0 refills | Status: DC

## 2024-01-13 NOTE — Telephone Encounter (Signed)
 Copied from CRM [id number]. Topic: Clinical - Prescription Issue >> Jan 12, 2024  5:42 PM [place] wrote: Reason for CRM: FEROSUL 325 (65 Fe) MG tablet- pharmacy states they did receive refill request

## 2024-01-13 NOTE — Telephone Encounter (Signed)
 Please advise [facility]

## 2024-01-20 ENCOUNTER — Ambulatory Visit: Payer: Commercial Managed Care - PPO | Admitting: Obstetrics and Gynecology

## 2024-01-24 ENCOUNTER — Inpatient Hospital Stay: Payer: Self-pay | Admitting: Nurse Practitioner

## 2024-02-02 ENCOUNTER — Other Ambulatory Visit: Payer: Self-pay | Admitting: Nurse Practitioner

## 2024-02-02 DIAGNOSIS — I10 Essential (primary) hypertension: Secondary | ICD-10-CM

## 2024-02-21 ENCOUNTER — Other Ambulatory Visit (HOSPITAL_COMMUNITY)
Admission: RE | Admit: 2024-02-21 | Discharge: 2024-02-21 | Disposition: A | Discharge status: Home / Self Care | Source: Ambulatory Visit | Attending: Obstetrics and Gynecology | Admitting: Obstetrics and Gynecology

## 2024-02-21 ENCOUNTER — Encounter: Payer: Self-pay | Admitting: Obstetrics and Gynecology

## 2024-02-21 ENCOUNTER — Ambulatory Visit: Admitting: Obstetrics and Gynecology

## 2024-02-21 ENCOUNTER — Ambulatory Visit (INDEPENDENT_AMBULATORY_CARE_PROVIDER_SITE_OTHER)

## 2024-02-21 VITALS — BP 122/76 | HR 70

## 2024-02-21 DIAGNOSIS — Z113 Encounter for screening for infections with a predominantly sexual mode of transmission: Secondary | ICD-10-CM

## 2024-02-21 DIAGNOSIS — Z114 Encounter for screening for human immunodeficiency virus [HIV]: Secondary | ICD-10-CM

## 2024-02-21 DIAGNOSIS — Z1159 Encounter for screening for other viral diseases: Secondary | ICD-10-CM | POA: Diagnosis not present

## 2024-02-21 DIAGNOSIS — Z3042 Encounter for surveillance of injectable contraceptive: Secondary | ICD-10-CM | POA: Diagnosis not present

## 2024-02-21 DIAGNOSIS — N766 Ulceration of vulva: Secondary | ICD-10-CM

## 2024-02-21 MED ORDER — VALACYCLOVIR HCL 500 MG PO TABS: 500.0000 mg | ORAL_TABLET | Freq: Two times a day (BID) | ORAL | 0 refills | Status: DC

## 2024-02-21 MED ORDER — MEDROXYPROGESTERONE ACETATE 150 MG/ML IM SUSP
150.0000 mg | Freq: Once | INTRAMUSCULAR | Status: AC
  Administered 2024-02-21: 150 mg via INTRAMUSCULAR

## 2024-02-21 MED ORDER — CEPHALEXIN 500 MG PO CAPS: 500.0000 mg | ORAL_CAPSULE | Freq: Two times a day (BID) | ORAL | 0 refills | Status: AC

## 2024-02-21 MED ORDER — AZITHROMYCIN 1 G PO PACK: 1.0000 | PACK | Freq: Once | ORAL | 0 refills | Status: AC

## 2024-02-21 NOTE — Progress Notes (Unsigned)
 GYNECOLOGY  VISIT   HPI: 33 y.o.   Single  African American female   G1P0010 with No LMP recorded. Patient has had an injection.   here for: Irritation on labia- had recent skin infection had gone away but has now come back. Noticed 02/17/24. ED note in Epic 10/17/23.  Seen 10/17/23 in the ED for resolving left labial abscess with cellulitis.  Tx with doxycycline .  She was seen in her PCP office 10/28/23 with continued symptoms and was tx with Keflex .   Symptoms have recurred.    Using a squirt bottle to help cleanse the area.   She has been using a cream to help the area heal.   She has been trimming her hair on the vulva.  She has nicked herself with a trimmer.   Not SA since January.    A1C is borderline.  No current medication.   GYNECOLOGIC HISTORY: No LMP recorded. Patient has had an injection. 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   Seasonal allergies 11/23/2019   Hemoglobin A1C between 7% and 9% indicating borderline diabetic control (HCC) 09/28/2019   Diarrhea 05/29/2019   Dizziness 05/29/2019   Type 2 diabetes mellitus without complication, without long-term current use of insulin  (HCC) 11/09/2018   Hypertension 11/09/2018   Anxiety 11/09/2018   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   CIN II (cervical intraepithelial neoplasia II) 07/28/2018   CIN III (cervical intraepithelial neoplasia grade III) with severe dysplasia 07/28/2018   Palpitations 01/12/2018    Elevated troponin 01/12/2018   DKA (diabetic ketoacidosis) (HCC) 08/27/2017   Hyperglycemia 08/13/2017   Iron deficiency anemia 09/04/2016   Hyperthyroidism 07/05/2014   Thrombocytopenia (HCC) 05/27/2014   Hypercholesteremia     Past Medical History:  Diagnosis Date   Abnormal Pap smear of cervix 2019   LSIL, 03-24-23 lgsil HPV HR +   Abortion 12/2011   Allergy    Anemia    Angio-edema    Anxiety    Diabetes mellitus without complication (HCC)    Dizziness 11/22/2019   GERD (gastroesophageal reflux disease)    Hemoglobin A1c less than 7.0% 03/2020   History of ITP    History of palpitations    Cardiac event monitor June 2019: Normal.  Sinus rhythm no arrhythmias or significant PACs/PVCs.   Hypercholesteremia    Hyperglycemia 03/2020   Hypertension    Hyperthyroidism    Iron deficiency anemia 09/04/2016   Peripheral vertigo    Recurrent upper respiratory infection (URI)    Seasonal allergies 11/22/2019   Urticaria    Vertigo 11/22/2019   Vertigo     Past Surgical History:  Procedure Laterality Date   APPENDECTOMY     LAPAROSCOPIC APPENDECTOMY N/A 01/14/2017   Procedure: APPENDECTOMY LAPAROSCOPIC;  Surgeon: Gail Favorite, MD;  Location: [facility];  Service: General;  Laterality: N/A;    Current Outpatient Medications  Medication Sig Dispense Refill   amoxicillin -clavulanate (AUGMENTIN ) 875-125 MG tablet Take 1 tablet by mouth every 12 (twelve) hours. 14 tablet 0   atorvastatin  (LIPITOR) 40 MG tablet TAKE 1 TABLET(40 MG) BY MOUTH DAILY 30 tablet 1   Biotin  10000 MCG TABS Take 1,000 mcg by mouth daily.      busPIRone  (BUSPAR ) 10 MG tablet TAKE 1 TABLET(10 MG) BY MOUTH TWICE DAILY 60 tablet 0   cetirizine  (ZYRTEC ) 10 MG tablet Take 1 tablet (10 mg total) by mouth daily as needed for allergies. 90 tablet 1   CINNAMON PO Take by mouth.     ferrous sulfate  (FEROSUL) 325 (65 FE) MG tablet Take 1 tablet (325 mg total) by mouth 2 (two) times daily with a meal. 60 tablet 0    Ginger, Zingiber officinalis, (GINGER PO) Take by mouth.     lisinopril  (ZESTRIL ) 5 MG tablet TAKE 1 TABLET(5 MG) BY MOUTH DAILY 90 tablet 0   medroxyPROGESTERone  (DEPO-PROVERA ) 150 MG/ML injection Inject 1 mL (150 mg total) into the muscle once for 1 dose. 1 mL 0   methimazole  (TAPAZOLE ) 5 MG tablet Take 1 tablet (5 mg total) by mouth every other day. 45 tablet 3   metoprolol  tartrate (LOPRESSOR ) 25 MG tablet TAKE 1/2 TABLET BY MOUTH EVERY MORNING 45 tablet 0   Multiple Vitamins-Minerals (MULTIVITAMIN ADULT PO) Take by mouth daily.     ofloxacin  (OCUFLOX ) 0.3 % ophthalmic solution Place 1 drop into the right eye 4 (four) times daily. 5 mL 0   omeprazole  (PRILOSEC) 40 MG capsule Take 1 capsule (40 mg total) by mouth daily. 30 capsule 1   OVER THE COUNTER MEDICATION Beet Root supplement     POTASSIUM PO Take by mouth.     Probiotic Product (PROBIOTIC-10 PO) Take by mouth. 1 caples     No current facility-administered medications for this visit.     ALLERGIES: Benadryl [diphenhydramine hcl] and Chloroxine  Family History  Problem Relation Age of Onset   Diabetes Other        Parent   Hypertension Other        Parent   Hyperlipidemia Other        Parent   Arthritis Other        Parent   Breast cancer Mother 15    Social History   Socioeconomic History   Marital status: Single    Spouse name: Not on file   Number of children: Not on file   Years of education: 14   Highest education level: Associate degree: occupational, Scientist, product/process development, or vocational program  Occupational History   Occupation: [employer]  Tobacco Use   Smoking status: Former    Current packs/day: 0.00    Average packs/day: 0.3 packs/day for 11.7 years (3.0 ttl pk-yrs)    Types: Cigarettes    Start date: 2011    Quit date: 2021    Years since quitting: 4.5   Smokeless tobacco: Never  Vaping Use   Vaping status: Never Used  Substance and Sexual Activity   Alcohol use: Yes    Comment: 2 glasses of  wine/month   Drug use: No   Sexual activity: Yes    Partners: Male    Birth control/protection: Condom, Injection  Comment: menarche 33yo, sexual debut 33yo  Other Topics Concern   Not on file  Social History Narrative   Regular exercise-yes   Caffeine Use-yes         Social Drivers of Health   Financial Resource Strain: High Risk (11/18/2023)   Overall Financial Resource Strain (CARDIA)    Difficulty of Paying Living Expenses: Very hard  Food Insecurity: Food Insecurity Present (11/18/2023)   Hunger Vital Sign    Worried About Running Out of Food in the Last Year: Often true    Ran Out of Food in the Last Year: Sometimes true  Transportation Needs: No Transportation Needs (11/18/2023)   PRAPARE - Administrator, Civil Service (Medical): No    Lack of Transportation (Non-Medical): No  Physical Activity: Sufficiently Active (11/18/2023)   Exercise Vital Sign    Days of Exercise per Week: 7 days    Minutes of Exercise per Session: 90 min  Stress: Stress Concern Present (11/18/2023)   [employer] of Occupational Health - Occupational Stress Questionnaire    Feeling of Stress : Very much  Social Connections: Socially Isolated (11/18/2023)   Social Connection and Isolation Panel    Frequency of Communication with Friends and Family: Three times a week    Frequency of Social Gatherings with Friends and Family: Never    Attends Religious Services: Never    Database administrator or Organizations: No    Attends Engineer, structural: Not on file    Marital Status: Never married  Intimate Partner Violence: Not on file    Review of Systems  see HPI.   PHYSICAL EXAMINATION:   There were no vitals taken for this visit.    General appearance: alert, cooperative and appears stated age   Pelvic: External genitalia:  1 x 1.5 cm painful left labia majora ulcer with exudate.  Smaller 3 mm painful shallow ulcer to the left of this.               Urethra:  normal  appearing urethra with no masses, tenderness or lesions              Bartholins and Skenes: normal                 Vagina: normal appearing vagina with normal color and discharge, no lesions              Cervix: no lesions                Bimanual Exam:  Uterus:  normal size, contour, position, consistency, mobility, non-tender              Adnexa: no mass, fullness, tenderness    Chaperone was present for exam:  Kari HERO, CMA  ASSESSMENT:  Painful vulvar ulcer. Recurrent vulvar lesions.  ?HSV? H Ducrei? ***  PLAN:  Wound cx. STD screening. Keflex  Valtrex .  Azithromycin   {LABS (Optional):23779}  ***  total time was spent for this patient encounter, including preparation, face-to-face counseling with the patient, coordination of care, and documentation of the encounter.

## 2024-02-22 ENCOUNTER — Encounter: Payer: Self-pay | Admitting: Obstetrics and Gynecology

## 2024-02-22 LAB — CERVICOVAGINAL ANCILLARY ONLY
Chlamydia: NEGATIVE
Comment: NEGATIVE
Comment: NEGATIVE
Comment: NORMAL
Neisseria Gonorrhea: NEGATIVE
Trichomonas: NEGATIVE

## 2024-02-22 LAB — HEPATITIS C ANTIBODY: Hepatitis C Ab: NONREACTIVE

## 2024-02-22 LAB — RPR: RPR Ser Ql: NONREACTIVE

## 2024-02-22 LAB — HIV ANTIBODY (ROUTINE TESTING W REFLEX): HIV 1&2 Ab, 4th Generation: NONREACTIVE

## 2024-02-23 ENCOUNTER — Encounter: Payer: Self-pay | Admitting: Obstetrics and Gynecology

## 2024-02-23 ENCOUNTER — Ambulatory Visit: Payer: Self-pay | Admitting: Obstetrics and Gynecology

## 2024-02-23 ENCOUNTER — Other Ambulatory Visit: Payer: Self-pay | Admitting: Obstetrics and Gynecology

## 2024-02-23 LAB — SURESWAB HSV, TYPE 1/2 DNA, PCR
HSV 1 DNA: NOT DETECTED
HSV 2 DNA: DETECTED — AB

## 2024-02-23 MED ORDER — AZITHROMYCIN 250 MG PO TABS: ORAL_TABLET | ORAL | 0 refills | Status: AC

## 2024-02-23 NOTE — Telephone Encounter (Signed)
 Dr. Nikki -please review and advise on alternative to Azithromycin  1g powder sent on 02/21/24.   Rx pended

## 2024-02-23 NOTE — Progress Notes (Signed)
 Rx for Azithromycin  250 mg, take 4 tablets, by mouth once.

## 2024-02-25 LAB — WOUND CULTURE
MICRO NUMBER:: 16665087
RESULT:: NO GROWTH
SPECIMEN QUALITY:: ADEQUATE

## 2024-02-28 ENCOUNTER — Ambulatory Visit: Admitting: Obstetrics and Gynecology

## 2024-03-04 ENCOUNTER — Other Ambulatory Visit: Payer: Self-pay | Admitting: Nurse Practitioner

## 2024-03-04 DIAGNOSIS — D508 Other iron deficiency anemias: Secondary | ICD-10-CM

## 2024-03-06 NOTE — Telephone Encounter (Signed)
 Please advise if you would like pt to continue or have labs. Thanks [employer]

## 2024-03-13 ENCOUNTER — Ambulatory Visit: Admitting: Obstetrics and Gynecology

## 2024-03-17 ENCOUNTER — Ambulatory Visit: Admitting: Obstetrics and Gynecology

## 2024-03-20 ENCOUNTER — Ambulatory Visit: Admitting: Obstetrics and Gynecology

## 2024-03-23 NOTE — Progress Notes (Signed)
 GYNECOLOGY  VISIT   HPI: 33 y.o.   Single  African American female   G1P0010 with No LMP recorded. Patient has had an injection.   here for: Follow up regarding results and recheck of the vulva - Concerns with changing birth control methods. Has concerns with dosage of buspar .     Seen for large vulvar ulcer on 02/21/24 of uncertain etiology. She was initially treated with Keflex , Valtrex , and Azithromycin  (for coverage of potential chancroid).   Final dx of HSV 2.  She has completed other STD testing, which is negative.  Had a second outbreak.    Has taken Valtrex .     States she has an ointment to put on the ulcer if it occurs.   It does not help the pain, and this is a concern for her.   Her Depo controls her periods well.  Every now and then she has a period that last 5 - 6 days.    GYNECOLOGIC HISTORY: No LMP recorded. Patient has had an injection. 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   Seasonal allergies 11/23/2019   Hemoglobin A1C between 7% and 9% indicating borderline diabetic control (HCC) 09/28/2019   Diarrhea 05/29/2019   Dizziness 05/29/2019   Type 2 diabetes mellitus without complication, without long-term current use of insulin  (HCC) 11/09/2018   Hypertension 11/09/2018   Anxiety 11/09/2018   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   CIN II (cervical intraepithelial neoplasia II) 07/28/2018   CIN III (cervical intraepithelial neoplasia  grade III) with severe dysplasia 07/28/2018   Palpitations 01/12/2018   Elevated troponin 01/12/2018   DKA (diabetic ketoacidosis) (HCC) 08/27/2017   Hyperglycemia 08/13/2017   Iron deficiency anemia 09/04/2016   Hyperthyroidism 07/05/2014   Thrombocytopenia (HCC) 05/27/2014   Hypercholesteremia     Past Medical History:  Diagnosis Date   Abnormal Pap smear of cervix 2019   LSIL, 03-24-23 lgsil HPV HR +   Abortion 12/2011   Allergy    Anemia    Angio-edema    Anxiety    Diabetes mellitus without complication (HCC)    Dizziness 11/22/2019   GERD (gastroesophageal reflux disease)    Hemoglobin A1c less than 7.0% 03/2020   History of ITP    History of palpitations    Cardiac event monitor June 2019: Normal.  Sinus rhythm no arrhythmias or significant PACs/PVCs.   HSV-2 infection 2025   Hypercholesteremia    Hyperglycemia 03/2020   Hypertension    Hyperthyroidism    Iron deficiency anemia 09/04/2016   Peripheral vertigo    Recurrent upper respiratory infection (URI)    Seasonal allergies 11/22/2019  Urticaria    Vertigo 11/22/2019   Vertigo     Past Surgical History:  Procedure Laterality Date   APPENDECTOMY     LAPAROSCOPIC APPENDECTOMY N/A 01/14/2017   Procedure: APPENDECTOMY LAPAROSCOPIC;  Surgeon: Gail Favorite, MD;  Location: [facility];  Service: General;  Laterality: N/A;    Current Outpatient Medications  Medication Sig Dispense Refill   atorvastatin  (LIPITOR) 40 MG tablet TAKE 1 TABLET(40 MG) BY MOUTH DAILY 30 tablet 1   Biotin  10000 MCG TABS Take 1,000 mcg by mouth daily.      busPIRone  (BUSPAR ) 10 MG tablet TAKE 1 TABLET(10 MG) BY MOUTH TWICE DAILY 60 tablet 0   cetirizine  (ZYRTEC ) 10 MG tablet Take 1 tablet (10 mg total) by mouth daily as needed for allergies. 90 tablet 1   CINNAMON PO Take by mouth.     FEROSUL 325 (65 Fe) MG tablet TAKE 1 TABLET(325 MG) BY MOUTH TWICE DAILY WITH A MEAL 60 tablet 0   Ginger, Zingiber officinalis, (GINGER PO) Take by  mouth.     lisinopril  (ZESTRIL ) 5 MG tablet TAKE 1 TABLET(5 MG) BY MOUTH DAILY 90 tablet 0   medroxyPROGESTERone  (DEPO-PROVERA ) 150 MG/ML injection Inject 1 mL (150 mg total) into the muscle once for 1 dose. 1 mL 0   methimazole  (TAPAZOLE ) 5 MG tablet Take 1 tablet (5 mg total) by mouth every other day. 45 tablet 3   metoprolol  tartrate (LOPRESSOR ) 25 MG tablet TAKE 1/2 TABLET BY MOUTH EVERY MORNING 45 tablet 0   Multiple Vitamins-Minerals (MULTIVITAMIN ADULT PO) Take by mouth daily.     ofloxacin  (OCUFLOX ) 0.3 % ophthalmic solution Place 1 drop into the right eye 4 (four) times daily. 5 mL 0   omeprazole  (PRILOSEC) 40 MG capsule Take 1 capsule (40 mg total) by mouth daily. 30 capsule 1   OVER THE COUNTER MEDICATION Beet Root supplement     POTASSIUM PO Take by mouth.     Probiotic Product (PROBIOTIC-10 PO) Take by mouth. 1 caples     azithromycin  (ZITHROMAX ) 250 MG tablet Take 4 tablets (1000 mg, which is 1 gram) by mouth once. (Patient not taking: Reported on 03/27/2024) 4 tablet 0   cephALEXin  (KEFLEX ) 500 MG capsule Take 1 capsule (500 mg total) by mouth 2 (two) times daily. Take twice daily for 1 week. (Patient not taking: Reported on 03/27/2024) 14 capsule 0   valACYclovir  (VALTREX ) 500 MG tablet Take one tablet by mouth daily for infection prevention.  Take a tablet twice daily for 3 days as needed for an outbreak. 36 tablet 5   No current facility-administered medications for this visit.     ALLERGIES: Benadryl [diphenhydramine hcl] and Chloroxine  Family History  Problem Relation Age of Onset   Diabetes Other        Parent   Hypertension Other        Parent   Hyperlipidemia Other        Parent   Arthritis Other        Parent   Breast cancer Mother 36    Social History   Socioeconomic History   Marital status: Single    Spouse name: Not on file   Number of children: Not on file   Years of education: 14   Highest education level: Associate degree: occupational, Scientist, product/process development,  or vocational program  Occupational History   Occupation: [employer]  Tobacco Use   Smoking status: Former    Current packs/day: 0.00    Average packs/day: 0.3 packs/day  for 11.7 years (3.0 ttl pk-yrs)    Types: Cigarettes    Start date: 2011    Quit date: 2021    Years since quitting: 4.6   Smokeless tobacco: Never  Vaping Use   Vaping status: Never Used  Substance and Sexual Activity   Alcohol use: Yes    Comment: 2 glasses of wine/month   Drug use: No   Sexual activity: Not Currently    Partners: Male    Birth control/protection: Injection    Comment: menarche 33yo, sexual debut 33yo  Other Topics Concern   Not on file  Social History Narrative   Regular exercise-yes   Caffeine Use-yes         Social Drivers of Health   Financial Resource Strain: High Risk (11/18/2023)   Overall Financial Resource Strain (CARDIA)    Difficulty of Paying Living Expenses: Very hard  Food Insecurity: Food Insecurity Present (11/18/2023)   Hunger Vital Sign    Worried About Running Out of Food in the Last Year: Often true    Ran Out of Food in the Last Year: Sometimes true  Transportation Needs: No Transportation Needs (11/18/2023)   PRAPARE - Administrator, Civil Service (Medical): No    Lack of Transportation (Non-Medical): No  Physical Activity: Sufficiently Active (11/18/2023)   Exercise Vital Sign    Days of Exercise per Week: 7 days    Minutes of Exercise per Session: 90 min  Stress: Stress Concern Present (11/18/2023)   [employer] of Occupational Health - Occupational Stress Questionnaire    Feeling of Stress : Very much  Social Connections: Socially Isolated (11/18/2023)   Social Connection and Isolation Panel    Frequency of Communication with Friends and Family: Three times a week    Frequency of Social Gatherings with Friends and Family: Never    Attends Religious Services: Never    Database administrator or Organizations: No    Attends Hospital doctor: Not on file    Marital Status: Never married  Catering manager Violence: Not on file    Review of Systems  PHYSICAL EXAMINATION:   BP (!) 152/82 (BP Location: Left Arm, Patient Position: Sitting, Cuff Size: Normal)   Pulse 66   Wt 222 lb (100.7 kg)   SpO2 99%   BMI 32.78 kg/m     General appearance: alert, cooperative and appears stated age  Pelvic: External genitalia:  subtle findings of essentially healed left labia majora ulcers.               Urethra:  normal appearing urethra with no masses, tenderness or lesions              Bartholins and Skenes: normal                 Vagina: normal appearing vagina with normal color and discharge, no lesions              Cervix: no lesions                Bimanual Exam:  Uterus:  normal size, contour, position, consistency, mobility, non-tender              Adnexa: no mass, fullness, tenderness           Chaperone was present for exam:  Clotilda DEL, CMA  ASSESSMENT:  HSV II.  Depo Provera .  On Buspar  for anxiety.   Hx cervical dysplasia  PLAN:  HSV II  discussed.  We discussed HSV, route of transmission, asymptomatic shedding possible if not having an outbreak, recurrence of outbreaks, condoms for infection prevention, treatment with antiviral medication to reduce time of outbreak/decrease frequency of infections/reduce viral shedding.  Start Valtrex  500 mg daily and take bid x 3 days for an outbreak.   Disp:  36, RF 5.  Lidocaine  2% jelly or 5% ointment OTC recommended to treat discomfort.  She will continue her Depo Provera .  We discussed rare association with meningioma and also reversible bone long with long term use.  Signs of headache with meningioma also reviewed.  She will contact her PCP regarding her Buspar  dosage. Keep upcoming appointment for pap/HPV testing with Dr. Glennon.  On chart review, it looks like her appointment was updated to do an annual exam.   23 min  total time was spent for this patient  encounter, including preparation, face-to-face counseling with the patient, coordination of care, and documentation of the encounter.

## 2024-03-27 ENCOUNTER — Encounter: Payer: Self-pay | Admitting: Obstetrics and Gynecology

## 2024-03-27 ENCOUNTER — Ambulatory Visit (INDEPENDENT_AMBULATORY_CARE_PROVIDER_SITE_OTHER): Admitting: Obstetrics and Gynecology

## 2024-03-27 VITALS — BP 152/82 | HR 66 | Wt 222.0 lb

## 2024-03-27 DIAGNOSIS — Z3009 Encounter for other general counseling and advice on contraception: Secondary | ICD-10-CM | POA: Diagnosis not present

## 2024-03-27 DIAGNOSIS — B009 Herpesviral infection, unspecified: Secondary | ICD-10-CM | POA: Diagnosis not present

## 2024-03-27 MED ORDER — VALACYCLOVIR HCL 500 MG PO TABS: ORAL_TABLET | ORAL | 5 refills | Status: DC

## 2024-03-27 NOTE — Patient Instructions (Addendum)
 Consider lidocaine  ointment 5% or lidocaine  jelly 2% for vulvar pain if it occurs.

## 2024-04-01 ENCOUNTER — Other Ambulatory Visit: Payer: Self-pay | Admitting: Nurse Practitioner

## 2024-04-01 DIAGNOSIS — I10 Essential (primary) hypertension: Secondary | ICD-10-CM

## 2024-04-03 ENCOUNTER — Encounter: Payer: Self-pay | Admitting: Obstetrics and Gynecology

## 2024-04-03 ENCOUNTER — Other Ambulatory Visit (HOSPITAL_COMMUNITY)
Admission: RE | Admit: 2024-04-03 | Discharge: 2024-04-03 | Disposition: A | Discharge status: Home / Self Care | Source: Ambulatory Visit | Attending: Obstetrics and Gynecology | Admitting: Obstetrics and Gynecology

## 2024-04-03 ENCOUNTER — Ambulatory Visit: Admitting: Obstetrics and Gynecology

## 2024-04-03 ENCOUNTER — Ambulatory Visit (INDEPENDENT_AMBULATORY_CARE_PROVIDER_SITE_OTHER): Admitting: Obstetrics and Gynecology

## 2024-04-03 VITALS — BP 102/60 | HR 70 | Ht 69.69 in | Wt 222.8 lb

## 2024-04-03 DIAGNOSIS — Z01419 Encounter for gynecological examination (general) (routine) without abnormal findings: Secondary | ICD-10-CM | POA: Insufficient documentation

## 2024-04-03 NOTE — Progress Notes (Signed)
 33 y.o. y.o. female here for annual exam. No LMP recorded. Patient has had an injection.      HPI: 33 y.o.   Single  African American female   [id number] with No LMP recorded. Patient has had an injection.   here for: Follow up regarding results and recheck of the vulva - Concerns with changing birth control methods. Has concerns with dosage of buspar .     Seen for large vulvar ulcer on 02/21/24 of uncertain etiology. She was initially treated with Keflex , Valtrex , and Azithromycin  (for coverage of potential chancroid).   Final dx of HSV 2.  She has completed other STD testing, which is negative.  Had a second outbreak.    Has taken Valtrex .     States she has an ointment to put on the ulcer if it occurs.   It does not help the pain, and this is a concern for her.   Her Depo controls her periods well.  Every now and then she has a period that last 5 - 6 days.    GYNECOLOGIC HISTORY: No LMP recorded. Patient has had an injection. Contraception:  Depo Provera  Injections Menopausal hormone therapy:  None Last 2 paps:  03/24/23 HR HPV positive, LSIL 02/24/22 HR HPV, ASCUS History of abnormal Pap or positive HPV:  yes Mammogram:  n/a Body mass index is 32.26 kg/m.    Blood pressure 102/60, pulse 70, height 5' 9.69 (1.77 m), weight 222 lb 12.8 oz (101.1 kg), SpO2 98%.     Component Value Date/Time   DIAGPAP - Low grade squamous intraepithelial lesion (LSIL) (A) 03/24/2023 0832   DIAGPAP (A) 02/24/2022 0830    - Atypical squamous cells of undetermined significance (ASC-US )   DIAGPAP  01/09/2021 1119    - Negative for intraepithelial lesion or malignancy (NILM)   HPVHIGH Positive (A) 03/24/2023 0832   HPVHIGH Positive (A) 02/24/2022 0830   HPVHIGH Negative 01/09/2021 1119   ADEQPAP  03/24/2023 0832    Satisfactory for evaluation; transformation zone component PRESENT.   ADEQPAP  02/24/2022 0830    Satisfactory for evaluation; transformation zone component PRESENT.    ADEQPAP  01/09/2021 1119    Satisfactory for evaluation; transformation zone component PRESENT.    GYN HISTORY:    Component Value Date/Time   DIAGPAP - Low grade squamous intraepithelial lesion (LSIL) (A) 03/24/2023 0832   DIAGPAP (A) 02/24/2022 0830    - Atypical squamous cells of undetermined significance (ASC-US )   DIAGPAP  01/09/2021 1119    - Negative for intraepithelial lesion or malignancy (NILM)   HPVHIGH Positive (A) 03/24/2023 0832   HPVHIGH Positive (A) 02/24/2022 0830   HPVHIGH Negative 01/09/2021 1119   ADEQPAP  03/24/2023 0832    Satisfactory for evaluation; transformation zone component PRESENT.   ADEQPAP  02/24/2022 0830    Satisfactory for evaluation; transformation zone component PRESENT.   ADEQPAP  01/09/2021 1119    Satisfactory for evaluation; transformation zone component PRESENT.    OB History  Gravida Para Term Preterm AB Living  1    1   SAB IAB Ectopic Multiple Live Births   1       # Outcome Date GA Lbr Len/2nd Weight Sex Type Anes PTL Lv  1 IAB 2013            Past Medical History:  Diagnosis Date   Abnormal Pap smear of cervix 2019   LSIL, 03-24-23 lgsil HPV HR +   Abortion 12/2011   Allergy  Anemia    Angio-edema    Anxiety    Diabetes mellitus without complication (HCC)    Dizziness 11/22/2019   GERD (gastroesophageal reflux disease)    Hemoglobin A1c less than 7.0% 03/2020   History of ITP    History of palpitations    Cardiac event monitor June 2019: Normal.  Sinus rhythm no arrhythmias or significant PACs/PVCs.   HSV-2 infection 2025   Hypercholesteremia    Hyperglycemia 03/2020   Hypertension    Hyperthyroidism    Iron deficiency anemia 09/04/2016   Peripheral vertigo    Recurrent upper respiratory infection (URI)    Seasonal allergies 11/22/2019   Urticaria    Vertigo 11/22/2019   Vertigo     Past Surgical History:  Procedure Laterality Date   APPENDECTOMY     LAPAROSCOPIC APPENDECTOMY N/A 01/14/2017   Procedure:  APPENDECTOMY LAPAROSCOPIC;  Surgeon: Gail Favorite, MD;  Location: [facility];  Service: General;  Laterality: N/A;    Current Outpatient Medications on File Prior to Visit  Medication Sig Dispense Refill   atorvastatin  (LIPITOR) 40 MG tablet TAKE 1 TABLET(40 MG) BY MOUTH DAILY 30 tablet 1   busPIRone  (BUSPAR ) 10 MG tablet TAKE 1 TABLET(10 MG) BY MOUTH TWICE DAILY 60 tablet 0   CINNAMON PO Take by mouth.     FEROSUL 325 (65 Fe) MG tablet TAKE 1 TABLET(325 MG) BY MOUTH TWICE DAILY WITH A MEAL 60 tablet 0   Ginger, Zingiber officinalis, (GINGER PO) Take by mouth.     lisinopril  (ZESTRIL ) 5 MG tablet TAKE 1 TABLET(5 MG) BY MOUTH DAILY 90 tablet 0   metoprolol  tartrate (LOPRESSOR ) 25 MG tablet TAKE 1/2 TABLET BY MOUTH EVERY MORNING 45 tablet 0   ofloxacin  (OCUFLOX ) 0.3 % ophthalmic solution Place 1 drop into the right eye 4 (four) times daily. 5 mL 0   OVER THE COUNTER MEDICATION Beet Root supplement     POTASSIUM PO Take by mouth.     Probiotic Product (PROBIOTIC-10 PO) Take by mouth. 1 caples     valACYclovir  (VALTREX ) 500 MG tablet Take one tablet by mouth daily for infection prevention.  Take a tablet twice daily for 3 days as needed for an outbreak. 36 tablet 5   azithromycin  (ZITHROMAX ) 250 MG tablet Take 4 tablets (1000 mg, which is 1 gram) by mouth once. (Patient not taking: Reported on 04/03/2024) 4 tablet 0   Biotin  10000 MCG TABS Take 1,000 mcg by mouth daily.      cephALEXin  (KEFLEX ) 500 MG capsule Take 1 capsule (500 mg total) by mouth 2 (two) times daily. Take twice daily for 1 week. (Patient not taking: Reported on 03/27/2024) 14 capsule 0   cetirizine  (ZYRTEC ) 10 MG tablet Take 1 tablet (10 mg total) by mouth daily as needed for allergies. 90 tablet 1   medroxyPROGESTERone  (DEPO-PROVERA ) 150 MG/ML injection Inject 1 mL (150 mg total) into the muscle once for 1 dose. 1 mL 0   methimazole  (TAPAZOLE ) 5 MG tablet Take 1 tablet (5 mg total) by mouth every other day. 45 tablet 3   Multiple  Vitamins-Minerals (MULTIVITAMIN ADULT PO) Take by mouth daily.     omeprazole  (PRILOSEC) 40 MG capsule Take 1 capsule (40 mg total) by mouth daily. 30 capsule 1   No current facility-administered medications on file prior to visit.    Social History   Socioeconomic History   Marital status: Single    Spouse name: Not on file   Number of children: Not on file  Years of education: 68   Highest education level: Associate degree: occupational, Scientist, product/process development, or vocational program  Occupational History   Occupation: [employer]  Tobacco Use   Smoking status: Former    Current packs/day: 0.00    Average packs/day: 0.3 packs/day for 11.7 years (3.0 ttl pk-yrs)    Types: Cigarettes    Start date: 2011    Quit date: 2021    Years since quitting: 4.6   Smokeless tobacco: Never  Vaping Use   Vaping status: Never Used  Substance and Sexual Activity   Alcohol use: Yes    Comment: 2 glasses of wine/month   Drug use: No   Sexual activity: Not Currently    Partners: Male    Birth control/protection: Injection    Comment: menarche 33yo, sexual debut 33yo  Other Topics Concern   Not on file  Social History Narrative   Regular exercise-yes   Caffeine Use-yes         Social Drivers of Health   Financial Resource Strain: High Risk (11/18/2023)   Overall Financial Resource Strain (CARDIA)    Difficulty of Paying Living Expenses: Very hard  Food Insecurity: Food Insecurity Present (11/18/2023)   Hunger Vital Sign    Worried About Running Out of Food in the Last Year: Often true    Ran Out of Food in the Last Year: Sometimes true  Transportation Needs: No Transportation Needs (11/18/2023)   PRAPARE - Administrator, Civil Service (Medical): No    Lack of Transportation (Non-Medical): No  Physical Activity: Sufficiently Active (11/18/2023)   Exercise Vital Sign    Days of Exercise per Week: 7 days    Minutes of Exercise per Session: 90 min  Stress: Stress Concern Present  (11/18/2023)   [employer] of Occupational Health - Occupational Stress Questionnaire    Feeling of Stress : Very much  Social Connections: Socially Isolated (11/18/2023)   Social Connection and Isolation Panel    Frequency of Communication with Friends and Family: Three times a week    Frequency of Social Gatherings with Friends and Family: Never    Attends Religious Services: Never    Database administrator or Organizations: No    Attends Engineer, structural: Not on file    Marital Status: Never married  Catering manager Violence: Not on file    Family History  Problem Relation Age of Onset   Diabetes Other        Parent   Hypertension Other        Parent   Hyperlipidemia Other        Parent   Arthritis Other        Parent   Breast cancer Mother 45     Allergies  Allergen Reactions   Benadryl [Diphenhydramine Hcl] Anaphylaxis and Hives   Chloroxine Hives    Clorox      Patient's last menstrual period was No LMP recorded. Patient has had an injection..           Review of Systems Alls systems reviewed and are negative.     Physical Exam Constitutional:      Appearance: Normal appearance.  Genitourinary:     Vulva and urethral meatus normal.     No lesions in the vagina.     Right Labia: No rash, lesions or skin changes.    Left Labia: No lesions, skin changes or rash.    No vaginal discharge or tenderness.     No  vaginal prolapse present.    No vaginal atrophy present.     Right Adnexa: not tender, not palpable and no mass present.    Left Adnexa: not tender, not palpable and no mass present.    No cervical motion tenderness or discharge.     Uterus is not enlarged, tender or irregular.  Breasts:    Right: Normal.     Left: Normal.  HENT:     Head: Normocephalic.  Neck:     Thyroid : No thyroid  mass, thyromegaly or thyroid  tenderness.  Cardiovascular:     Rate and Rhythm: Normal rate and regular rhythm.     Heart sounds: Normal heart  sounds, S1 normal and S2 normal.  Pulmonary:     Effort: Pulmonary effort is normal.     Breath sounds: Normal breath sounds and air entry.  Abdominal:     General: There is no distension.     Palpations: Abdomen is soft. There is no mass.     Tenderness: There is no abdominal tenderness. There is no guarding or rebound.  Musculoskeletal:        General: Normal range of motion.     Cervical back: Full passive range of motion without pain, normal range of motion and neck supple. No tenderness.     Right lower leg: No edema.     Left lower leg: No edema.  Neurological:     Mental Status: She is alert.  Skin:    General: Skin is warm.  Psychiatric:        Mood and Affect: Mood normal.        Behavior: Behavior normal.        Thought Content: Thought content normal.  Vitals and nursing note reviewed. Exam conducted with a chaperone present.       A:         Well Woman GYN exam             H/o abnormal pap smears                P:        Pap smear collected today Encouraged annual mammogram screening Gardesil: completed per patient. Labs and immunizations to do with PMD Encouraged healthy lifestyle practices Encouraged Vit D and Calcium    No follow-ups on file.  Kellie Shaffer

## 2024-04-10 LAB — CYTOLOGY - PAP

## 2024-04-12 ENCOUNTER — Ambulatory Visit: Payer: Self-pay | Admitting: Obstetrics and Gynecology

## 2024-04-13 NOTE — Progress Notes (Signed)
 Pt is schedule for her colpo with Dr. Glennon on 05/31/24

## 2024-04-29 ENCOUNTER — Other Ambulatory Visit: Payer: Self-pay | Admitting: Nurse Practitioner

## 2024-04-29 DIAGNOSIS — D508 Other iron deficiency anemias: Secondary | ICD-10-CM

## 2024-05-04 ENCOUNTER — Telehealth: Payer: Self-pay | Admitting: Podiatry

## 2024-05-04 NOTE — Telephone Encounter (Signed)
 Patient states she id currently experiencing pain in the right foot. The entire foot and ankle are painful. Ankle and foot are swollen and the big toe is red. When attempting to walk she has to limp and the pressure creates excruciating pain. She is scheduled to come in 9/22 at extended care. Is there anything she can do from home to relieve pain?

## 2024-05-08 ENCOUNTER — Ambulatory Visit

## 2024-05-22 ENCOUNTER — Encounter: Payer: Self-pay | Admitting: Nurse Practitioner

## 2024-05-22 ENCOUNTER — Ambulatory Visit: Admitting: Nurse Practitioner

## 2024-05-22 VITALS — BP 145/86 | HR 67 | Resp 16 | Wt 223.2 lb

## 2024-05-22 DIAGNOSIS — R7303 Prediabetes: Secondary | ICD-10-CM | POA: Diagnosis not present

## 2024-05-22 DIAGNOSIS — I10 Essential (primary) hypertension: Secondary | ICD-10-CM

## 2024-05-22 NOTE — Progress Notes (Signed)
 Subjective   Patient ID: Kellie Shaffer, female    DOB: Sep 28, 1990, 33 y.o.   MRN: [id number]  Chief Complaint  Patient presents with   Hypertension    6 month f/u     Referring provider: Oley Bascom RAMAN, NP  Kellie Shaffer is a 33 y.o. 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  Sinus rhythm               no arrhythmias or significant PACs/PVCs. 2025: HSV-2 infection No date: Hypercholesteremia 03/2020: Hyperglycemia No date: Hypertension No date: Hyperthyroidism 09/04/2016: Iron deficiency anemia No date: Peripheral vertigo No date: Recurrent upper respiratory infection (URI) 11/22/2019: Seasonal allergies No date: Urticaria 11/22/2019: Vertigo No date: Vertigo   HPI  Patient presents today for follow-up visit.  Overall she has been doing well with no new issues or concerns.  Patient is followed by hematology for anemia.  Patient is followed by endocrinology for thyroid  disorder.  Patient does have an established OB/GYN. Has gained weight.  Denies f/c/s, n/v/d, hemoptysis, PND, leg swelling. Denies chest pain or edema.    Allergies  Allergen Reactions   Benadryl [Diphenhydramine Hcl] Anaphylaxis and Hives   Chloroxine Hives    Clorox    Immunization History  Administered Date(s) Administered   Influenza,inj,Quad PF,6+ Mos 10/04/2018, 06/21/2019   Pneumococcal-Unspecified 08/17/2009   Tdap 08/17/2010    Tobacco History: Social History   Tobacco Use  Smoking Status Former   Current packs/day: 0.00   Average  packs/day: 0.3 packs/day for 11.7 years (3.0 ttl pk-yrs)   Types: Cigarettes   Start date: 2011   Quit date: 2021   Years since quitting: 4.7  Smokeless Tobacco Never   Counseling given: Not Answered   Outpatient Encounter Medications as of 05/22/2024  Medication Sig   Biotin  89999 MCG TABS Take 1,000 mcg by mouth daily.    busPIRone  (BUSPAR ) 10 MG tablet TAKE 1 TABLET(10 MG) BY MOUTH TWICE DAILY   cetirizine  (ZYRTEC ) 10 MG tablet Take 1 tablet (10 mg total) by mouth daily as needed for allergies.   CINNAMON PO Take by mouth.   FEROSUL 325 (65 Fe) MG tablet TAKE 1 TABLET(325 MG) BY MOUTH TWICE DAILY WITH A MEAL   Ginger, Zingiber officinalis, (GINGER PO) Take by mouth.   methimazole  (TAPAZOLE ) 5 MG tablet Take 1 tablet (5 mg total) by mouth every other day.   Multiple Vitamins-Minerals (MULTIVITAMIN ADULT PO) Take by mouth daily.   ofloxacin  (OCUFLOX ) 0.3 % ophthalmic solution Place 1 drop into the right eye 4 (four) times daily.   omeprazole  (PRILOSEC) 40 MG capsule Take 1 capsule (40 mg total) by mouth daily.   POTASSIUM PO Take by mouth.   Probiotic Product (PROBIOTIC-10 PO) Take by mouth. 1 caples   valACYclovir  (VALTREX ) 500 MG tablet Take one tablet by mouth daily for infection prevention.  Take a tablet twice daily for 3 days as needed for an outbreak.   atorvastatin  (LIPITOR) 40 MG tablet TAKE 1 TABLET(40 MG) BY MOUTH DAILY   azithromycin  (ZITHROMAX ) 250 MG tablet Take 4  tablets (1000 mg, which is 1 gram) by mouth once. (Patient not taking: Reported on 05/22/2024)   cephALEXin  (KEFLEX ) 500 MG capsule Take 1 capsule (500 mg total) by mouth 2 (two) times daily. Take twice daily for 1 week. (Patient not taking: Reported on 04/03/2024)   lisinopril  (ZESTRIL ) 5 MG tablet TAKE 1 TABLET(5 MG) BY MOUTH DAILY   medroxyPROGESTERone  (DEPO-PROVERA ) 150 MG/ML injection Inject 1 mL (150 mg total) into the muscle once for 1 dose.   metoprolol  tartrate (LOPRESSOR ) 25 MG tablet TAKE 1/2 TABLET BY  MOUTH EVERY MORNING   OVER THE COUNTER MEDICATION Beet Root supplement   No facility-administered encounter medications on file as of 05/22/2024.    Review of Systems  Review of Systems  Constitutional: Negative.   HENT: Negative.    Cardiovascular: Negative.   Gastrointestinal: Negative.   Allergic/Immunologic: Negative.   Neurological: Negative.   Psychiatric/Behavioral: Negative.       Objective:   BP (!) 145/86 (BP Location: Left Arm, Patient Position: Sitting, Cuff Size: Large)   Pulse 67   Resp 16   Wt 223 lb 3.2 oz (101.2 kg)   SpO2 98%   BMI 32.32 kg/m   Wt Readings from Last 5 Encounters:  05/22/24 223 lb 3.2 oz (101.2 kg)  04/03/24 222 lb 12.8 oz (101.1 kg)  03/27/24 222 lb (100.7 kg)  01/07/24 217 lb (98.4 kg)  12/15/23 221 lb 6.4 oz (100.4 kg)     Physical Exam Vitals and nursing note reviewed.  Constitutional:      General: She is not in acute distress.    Appearance: She is well-developed.  Cardiovascular:     Rate and Rhythm: Normal rate and regular rhythm.  Pulmonary:     Effort: Pulmonary effort is normal.     Breath sounds: Normal breath sounds.  Neurological:     Mental Status: She is alert and oriented to person, place, and time.       Assessment & Plan:   Hypertension, unspecified type -     CBC -     Comprehensive metabolic panel with GFR  Prediabetes -     Hemoglobin A1c     Return in about 6 months (around 11/20/2024).    Bascom GORMAN Borer, NP 05/22/2024

## 2024-05-22 NOTE — Patient Instructions (Signed)

## 2024-05-23 LAB — COMPREHENSIVE METABOLIC PANEL WITH GFR
ALT: 24 IU/L (ref 0–32)
AST: 17 IU/L (ref 0–40)
Albumin: 4.5 g/dL (ref 3.9–4.9)
Alkaline Phosphatase: 76 IU/L (ref 41–116)
BUN/Creatinine Ratio: 14 (ref 9–23)
BUN: 11 mg/dL (ref 6–20)
Bilirubin Total: 0.7 mg/dL (ref 0.0–1.2)
CO2: 22 mmol/L (ref 20–29)
Calcium: 9.8 mg/dL (ref 8.7–10.2)
Chloride: 103 mmol/L (ref 96–106)
Creatinine, Ser: 0.79 mg/dL (ref 0.57–1.00)
Globulin, Total: 3 g/dL (ref 1.5–4.5)
Glucose: 124 mg/dL — ABNORMAL HIGH (ref 70–99)
Potassium: 4.1 mmol/L (ref 3.5–5.2)
Sodium: 138 mmol/L (ref 134–144)
Total Protein: 7.5 g/dL (ref 6.0–8.5)
eGFR: 101 mL/min/1.73 (ref >59)

## 2024-05-23 LAB — CBC
Hematocrit: 39.9 % (ref 34.0–46.6)
Hemoglobin: 13 g/dL (ref 11.1–15.9)
MCH: 29.1 pg (ref 26.6–33.0)
MCHC: 32.6 g/dL (ref 31.5–35.7)
MCV: 90 fL (ref 79–97)
Platelets: 176 x10E3/uL (ref 150–450)
RBC: 4.46 x10E6/uL (ref 3.77–5.28)
RDW: 12.4 % (ref 11.7–15.4)
WBC: 6.5 x10E3/uL (ref 3.4–10.8)

## 2024-05-23 LAB — HEMOGLOBIN A1C
Est. average glucose Bld gHb Est-mCnc: 143 mg/dL
Hgb A1c MFr Bld: 6.6 % — ABNORMAL HIGH (ref 4.8–5.6)

## 2024-05-24 ENCOUNTER — Ambulatory Visit: Payer: Self-pay | Admitting: Nurse Practitioner

## 2024-05-26 ENCOUNTER — Ambulatory Visit: Payer: Self-pay | Admitting: Nurse Practitioner

## 2024-05-31 ENCOUNTER — Encounter: Admitting: Obstetrics and Gynecology

## 2024-06-14 ENCOUNTER — Other Ambulatory Visit: Payer: Self-pay | Admitting: Nurse Practitioner

## 2024-06-14 DIAGNOSIS — D508 Other iron deficiency anemias: Secondary | ICD-10-CM

## 2024-06-16 ENCOUNTER — Telehealth: Payer: Self-pay

## 2024-06-16 DIAGNOSIS — E0590 Thyrotoxicosis, unspecified without thyrotoxic crisis or storm: Secondary | ICD-10-CM

## 2024-06-16 MED ORDER — METHIMAZOLE 5 MG PO TABS: 5.0000 mg | ORAL_TABLET | ORAL | 0 refills | Status: AC

## 2024-06-16 NOTE — Telephone Encounter (Signed)
 Pt called requesting a refill and I did let her know that we have not seen her. I advised to her I would send in a script (30 days only ) but she needs to make an appt. Further refills will not be provided and her PCP will need to take over.

## 2024-07-01 ENCOUNTER — Other Ambulatory Visit: Payer: Self-pay | Admitting: Nurse Practitioner

## 2024-07-01 DIAGNOSIS — I10 Essential (primary) hypertension: Secondary | ICD-10-CM

## 2024-07-04 ENCOUNTER — Other Ambulatory Visit: Payer: Self-pay | Admitting: Nurse Practitioner

## 2024-07-04 DIAGNOSIS — I10 Essential (primary) hypertension: Secondary | ICD-10-CM

## 2024-07-04 NOTE — Telephone Encounter (Signed)
 Copied from CRM [id number]. Topic: Clinical - Medication Refill >> Jul 04, 2024  3:19 PM Amy B wrote: Medication:  lisinopril  (ZESTRIL ) 5 MG tablet   Has the patient contacted their pharmacy? Yes (Agent: If no, request that the patient contact the pharmacy for the refill. If patient does not wish to contact the pharmacy document the reason why and proceed with request.) (Agent: If yes, when and what did the pharmacy advise?)  This is the patient's preferred pharmacy:  [id number] [place], [place] - [address] AT [facility] OF [facility] & [address] [place] [address] Phone: [phone] Fax: [phone]  Is this the correct pharmacy for this prescription? Yes If no, delete pharmacy and type the correct one.   Has the prescription been filled recently? No  Is the patient out of the medication? No  Has the patient been seen for an appointment in the last year OR does the patient have an upcoming appointment? Yes  Can we respond through MyChart? Yes  Agent: Please be advised that Rx refills may take up to 3 business days. We ask that you follow-up with your pharmacy. >> Jul 04, 2024  3:31 PM Antwanette L wrote: The patient is calling back to let us  know she has one pill left. For future reference, the patient would prefer that the provider call in prescription refills rather than submitting them electronically

## 2024-07-04 NOTE — Telephone Encounter (Signed)
 Copied from CRM [id number]. Topic: Clinical - Medication Refill >> Jul 04, 2024  3:19 PM Amy B wrote: Medication:  lisinopril  (ZESTRIL ) 5 MG tablet   Has the patient contacted their pharmacy? Yes (Agent: If no, request that the patient contact the pharmacy for the refill. If patient does not wish to contact the pharmacy document the reason why and proceed with request.) (Agent: If yes, when and what did the pharmacy advise?)  This is the patient's preferred pharmacy:  [id number] [place], [place] - [address] AT [facility] OF [facility] & [address] [place] [address] Phone: [phone] Fax: [phone]  Is this the correct pharmacy for this prescription? Yes If no, delete pharmacy and type the correct one.   Has the prescription been filled recently? No  Is the patient out of the medication? No  Has the patient been seen for an appointment in the last year OR does the patient have an upcoming appointment? Yes  Can we respond through MyChart? Yes  Agent: Please be advised that Rx refills may take up to 3 business days. We ask that you follow-up with your pharmacy.

## 2024-07-06 ENCOUNTER — Other Ambulatory Visit: Payer: Self-pay

## 2024-07-06 ENCOUNTER — Ambulatory Visit: Payer: Self-pay

## 2024-07-06 DIAGNOSIS — I10 Essential (primary) hypertension: Secondary | ICD-10-CM

## 2024-07-06 MED ORDER — LISINOPRIL 5 MG PO TABS: ORAL_TABLET | ORAL | 0 refills | Status: DC

## 2024-07-06 NOTE — Telephone Encounter (Signed)
 Pt calling stating the pharmacy does not have the refill of lisinopril . Per chart refill was received by pharm. RN called pharmacy [facility], [address] [place], pharmacy staff states they do not have a refill for the lisinopril . RN contacted [facility], CAL advises they will resend the refill to [facility]. Pt was advised that per CAL the medication would be resent to [place].

## 2024-07-07 ENCOUNTER — Other Ambulatory Visit: Payer: Self-pay

## 2024-07-07 DIAGNOSIS — I10 Essential (primary) hypertension: Secondary | ICD-10-CM

## 2024-07-07 MED ORDER — LISINOPRIL 5 MG PO TABS: ORAL_TABLET | ORAL | 0 refills | Status: AC

## 2024-07-07 NOTE — Telephone Encounter (Signed)
 Resent . Kh

## 2024-07-09 ENCOUNTER — Other Ambulatory Visit: Payer: Self-pay | Admitting: Nurse Practitioner

## 2024-07-09 DIAGNOSIS — I10 Essential (primary) hypertension: Secondary | ICD-10-CM

## 2024-07-19 ENCOUNTER — Other Ambulatory Visit: Payer: Self-pay | Admitting: Nurse Practitioner

## 2024-07-19 DIAGNOSIS — I10 Essential (primary) hypertension: Secondary | ICD-10-CM

## 2024-07-25 ENCOUNTER — Other Ambulatory Visit: Payer: Self-pay | Admitting: Nurse Practitioner

## 2024-07-25 DIAGNOSIS — I10 Essential (primary) hypertension: Secondary | ICD-10-CM

## 2024-07-25 DIAGNOSIS — D508 Other iron deficiency anemias: Secondary | ICD-10-CM

## 2024-07-25 MED ORDER — FERROUS SULFATE 325 (65 FE) MG PO TABS: 325.0000 mg | ORAL_TABLET | Freq: Every day | ORAL | 0 refills | Status: DC

## 2024-07-25 MED ORDER — ATORVASTATIN CALCIUM 40 MG PO TABS: ORAL_TABLET | ORAL | 0 refills | Status: AC

## 2024-07-25 MED ORDER — METOPROLOL TARTRATE 25 MG PO TABS: ORAL_TABLET | ORAL | 0 refills | Status: AC

## 2024-07-25 NOTE — Telephone Encounter (Signed)
 Copied from CRM #[id number]. Topic: Clinical - Medication Refill >> Jul 25, 2024  1:36 PM Kendralyn S wrote: Medication:  atorvastatin  (LIPITOR) 40 MG tablet FEROSUL 325 (65 Fe) MG tablet metoprolol  tartrate (LOPRESSOR ) 25 MG tablet   Has the patient contacted their pharmacy? Yes (Agent: If no, request that the patient contact the pharmacy for the refill. If patient does not wish to contact the pharmacy document the reason why and proceed with request.) (Agent: If yes, when and what did the pharmacy advise?)  This is the patient's preferred pharmacy:  [id number] [place], [place] - [address] AT [facility] OF [facility] & [address] [place] [address] Phone: [phone] Fax: [phone]  Is this the correct pharmacy for this prescription? Yes If no, delete pharmacy and type the correct one.   Has the prescription been filled recently? No  Is the patient out of the medication? Yes  Has the patient been seen for an appointment in the last year OR does the patient have an upcoming appointment? Yes  Can we respond through MyChart? Yes  Agent: Please be advised that Rx refills may take up to 3 business days. We ask that you follow-up with your pharmacy.

## 2024-08-04 ENCOUNTER — Other Ambulatory Visit: Payer: Self-pay

## 2024-08-04 MED ORDER — VALACYCLOVIR HCL 500 MG PO TABS: ORAL_TABLET | ORAL | 5 refills | Status: AC

## 2024-08-04 NOTE — Telephone Encounter (Signed)
 Med refill request: ValACYclovir  (VALTREX ) 500 MG tablet   Pt left a vm asking for a one month refill and is asking that MD please make sure pharmacy is aware of the following directions:  Take one tablet by mouth daily for infection prevention.  Take a tablet twice daily for 3 days as needed for an outbreak.  Start:  03/27/24 Disp:  36 tablets Refills:  5  Last AEX:  04/03/24 Next AEX:  Not yet scheduled Last MMG (if hormonal med):  N/A Refill authorized? Please Advise.

## 2024-08-08 NOTE — Telephone Encounter (Signed)
 Call placed to patient, no answer, voicemail full.  Mychart message to patient.

## 2024-08-25 ENCOUNTER — Telehealth: Payer: Self-pay

## 2024-08-25 NOTE — Telephone Encounter (Signed)
 Patient called stating that she is wanting to restart Depo due to painful cycles. She states that she has not been sexually active since Jan of 2025. Please advise if patient needs office visit.

## 2024-08-28 NOTE — Telephone Encounter (Signed)
 Patient scheduled for UPT and Depo on 09/04/24

## 2024-08-29 ENCOUNTER — Encounter: Admitting: Obstetrics and Gynecology

## 2024-09-03 ENCOUNTER — Other Ambulatory Visit: Payer: Self-pay | Admitting: Obstetrics and Gynecology

## 2024-09-03 DIAGNOSIS — Z8742 Personal history of other diseases of the female genital tract: Secondary | ICD-10-CM

## 2024-09-04 ENCOUNTER — Ambulatory Visit: Payer: Self-pay

## 2024-09-04 NOTE — Progress Notes (Unsigned)
 Patient is here for Depo Provera  Injection Patient is not within Depo Provera  Calender Limits  UPT given Result *** Next Depo Due between:  4/6-4/20 Last AEX: 04/03/24 AEX Scheduled: not yet scheduled   Patient is aware when next depo is due  Pt tolerated Injection well.  Routed to provider for review, encounter closed.

## 2024-09-09 ENCOUNTER — Other Ambulatory Visit: Payer: Self-pay | Admitting: Nurse Practitioner

## 2024-09-09 DIAGNOSIS — D508 Other iron deficiency anemias: Secondary | ICD-10-CM

## 2024-09-14 ENCOUNTER — Telehealth: Payer: Self-pay

## 2024-09-14 NOTE — Telephone Encounter (Signed)
 Spoke with patient she states that she has not been sexually active since middle of last year. Advised her that if she does choose to be sexually active in the next 7 days after receiving depo injection she should use back up contraceptive. Patient advised that UPT will be done in the office on day of visit. She is ok with that. Will add Depo to appointment note on 2/4.

## 2024-09-20 ENCOUNTER — Ambulatory Visit (INDEPENDENT_AMBULATORY_CARE_PROVIDER_SITE_OTHER): Admitting: Obstetrics and Gynecology

## 2024-09-20 ENCOUNTER — Encounter: Payer: Self-pay | Admitting: Obstetrics and Gynecology

## 2024-09-20 ENCOUNTER — Other Ambulatory Visit (HOSPITAL_COMMUNITY): Admission: RE | Admit: 2024-09-20 | Source: Ambulatory Visit

## 2024-09-20 VITALS — BP 145/86 | HR 75 | Ht 69.0 in | Wt 216.0 lb

## 2024-09-20 DIAGNOSIS — N946 Dysmenorrhea, unspecified: Secondary | ICD-10-CM

## 2024-09-20 DIAGNOSIS — R87612 Low grade squamous intraepithelial lesion on cytologic smear of cervix (LGSIL): Secondary | ICD-10-CM | POA: Diagnosis not present

## 2024-09-20 DIAGNOSIS — N938 Other specified abnormal uterine and vaginal bleeding: Secondary | ICD-10-CM

## 2024-09-20 DIAGNOSIS — Z3042 Encounter for surveillance of injectable contraceptive: Secondary | ICD-10-CM | POA: Diagnosis not present

## 2024-09-20 DIAGNOSIS — Z8742 Personal history of other diseases of the female genital tract: Secondary | ICD-10-CM

## 2024-09-20 DIAGNOSIS — N921 Excessive and frequent menstruation with irregular cycle: Secondary | ICD-10-CM | POA: Diagnosis not present

## 2024-09-20 LAB — PREGNANCY, URINE: Preg Test, Ur: NEGATIVE

## 2024-09-20 MED ORDER — MEDROXYPROGESTERONE ACETATE 150 MG/ML IM SUSY
150.0000 mg | PREFILLED_SYRINGE | Freq: Once | INTRAMUSCULAR | Status: AC
  Administered 2024-09-20: 150 mg via INTRAMUSCULAR

## 2024-09-20 NOTE — Progress Notes (Signed)
 Colposcopy Procedure Note Kellie Shaffer 09/20/2024  Indications: LGSIL  Procedure Details  The risks and benefits of the procedure and Written informed consent obtained.  Speculum placed in vagina and excellent visualization of cervix achieved, cervix swabbed x 3 with acetic acid solution. She is almost in tears today and struggles with the cervical procedures.  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 Her periods are debilitating and she has been on depo and still bleeds. She bled most of Jan and the pain is horrible and felt in her sacrum with her cycles. They both verbalize that the hysterectomy is permanent and cannot be reversed, if she changes her mind.  Findings:  Cervix colposcopy biopsy taken: 12 O'clock for acetowhite changes  ECC performed with cytobrush  Hemostasis obtained without application of Monsel's Solution   Complications:   LGSIL DUB  Menorrhagia Dysmenorrhea Does not desire future fertility Difficulty with cervical procedure Failed hormonal management   Plan: Depo given today Symptomatic fibroid uterus:  Counseled on all options.  She would like to have the [facility].  Counseled extensively on the procedure including but not limited to what to expect and risks and benefits.  Counseled on postop care and pelvic rest for 10 weeks after the surgery with restricted lifting for 6 weeks after.  Counseled on the benefits of the robotic procedure with faster return to daily activities, improved outcomes, and less risk for complications. She would like to have this scheduled. Discussed she will need to complete the PUS and EMB prior to the procedure   Almarie MARLA Carpen

## 2024-09-20 NOTE — Progress Notes (Signed)
 Colposcopy - Pt would like to discuss Hysterectomy. Pt reports bleeding for most of Jan 2026  Depo given IM in [facility] ([facility], [facility])

## 2024-09-20 NOTE — Addendum Note (Signed)
 Addended by: Dashan Chizmar P on: 09/20/2024 05:44 PM   Modules accepted: Orders

## 2024-09-22 ENCOUNTER — Ambulatory Visit: Payer: Self-pay | Admitting: Obstetrics and Gynecology

## 2024-09-22 LAB — SURGICAL PATHOLOGY

## 2024-10-26 ENCOUNTER — Other Ambulatory Visit

## 2024-10-26 ENCOUNTER — Other Ambulatory Visit: Admitting: Obstetrics and Gynecology

## 2024-11-20 ENCOUNTER — Ambulatory Visit: Payer: Self-pay | Admitting: Nurse Practitioner
